# Patient Record
Sex: Female | Born: 1935
Health system: Southern US, Community
[De-identification: ages and names within clinical notes are randomized; demographics above are authoritative.]

## PROBLEM LIST (undated history)

## (undated) DIAGNOSIS — F32A Depression, unspecified: Secondary | ICD-10-CM

## (undated) DIAGNOSIS — N189 Chronic kidney disease, unspecified: Secondary | ICD-10-CM

## (undated) DIAGNOSIS — Z952 Presence of prosthetic heart valve: Secondary | ICD-10-CM

## (undated) DIAGNOSIS — E039 Hypothyroidism, unspecified: Secondary | ICD-10-CM

## (undated) DIAGNOSIS — F329 Major depressive disorder, single episode, unspecified: Secondary | ICD-10-CM

## (undated) DIAGNOSIS — I2510 Atherosclerotic heart disease of native coronary artery without angina pectoris: Secondary | ICD-10-CM

## (undated) DIAGNOSIS — E079 Disorder of thyroid, unspecified: Secondary | ICD-10-CM

## (undated) DIAGNOSIS — M109 Gout, unspecified: Secondary | ICD-10-CM

## (undated) DIAGNOSIS — I10 Essential (primary) hypertension: Secondary | ICD-10-CM

## (undated) DIAGNOSIS — I350 Nonrheumatic aortic (valve) stenosis: Secondary | ICD-10-CM

## (undated) DIAGNOSIS — Z950 Presence of cardiac pacemaker: Secondary | ICD-10-CM

## (undated) DIAGNOSIS — E119 Type 2 diabetes mellitus without complications: Secondary | ICD-10-CM

## (undated) HISTORY — PX: ROTATOR CUFF REPAIR: SHX139

## (undated) HISTORY — PX: APPENDECTOMY: SHX54

## (undated) HISTORY — PX: EYE SURGERY: SHX253

## (undated) HISTORY — PX: FEMUR FRACTURE SURGERY: SHX633

## (undated) HISTORY — PX: THYROID SURGERY: SHX805

---

## 2008-05-13 ENCOUNTER — Emergency Department (HOSPITAL_BASED_OUTPATIENT_CLINIC_OR_DEPARTMENT_OTHER): Admission: EM | Admit: 2008-05-13 | Discharge: 2008-05-13 | Payer: Self-pay | Admitting: Emergency Medicine

## 2011-03-31 LAB — COMPREHENSIVE METABOLIC PANEL
Alkaline Phosphatase: 50
Calcium: 8.8
Creatinine, Ser: 1
GFR calc Af Amer: >60
Potassium: 5.9 — ABNORMAL HIGH

## 2011-03-31 LAB — DIFFERENTIAL
Basophils Relative: 1
Lymphs Abs: 1.3
Monocytes Relative: 7
Neutrophils Relative %: 73

## 2011-03-31 LAB — PROTIME-INR: Prothrombin Time: 14.9

## 2011-03-31 LAB — URINE CULTURE

## 2011-03-31 LAB — GLUCOSE, CAPILLARY: Glucose-Capillary: 116 — ABNORMAL HIGH

## 2011-03-31 LAB — URINALYSIS, ROUTINE W REFLEX MICROSCOPIC
Bilirubin Urine: NEGATIVE
Hgb urine dipstick: NEGATIVE
Ketones, ur: NEGATIVE
Protein, ur: NEGATIVE
Specific Gravity, Urine: 1.007
pH: 5

## 2011-03-31 LAB — CBC
Hemoglobin: 10.9 — ABNORMAL LOW
RDW: 15
WBC: 7.8

## 2011-03-31 LAB — URINE MICROSCOPIC-ADD ON

## 2011-03-31 LAB — POCT CARDIAC MARKERS: Myoglobin, poc: 141

## 2014-10-02 ENCOUNTER — Emergency Department (HOSPITAL_BASED_OUTPATIENT_CLINIC_OR_DEPARTMENT_OTHER): Payer: Medicare Other

## 2014-10-02 ENCOUNTER — Emergency Department (HOSPITAL_BASED_OUTPATIENT_CLINIC_OR_DEPARTMENT_OTHER)
Admission: EM | Admit: 2014-10-02 | Discharge: 2014-10-02 | Disposition: A | Discharge status: Home / Self Care | Payer: Medicare Other | Attending: Emergency Medicine | Admitting: Emergency Medicine

## 2014-10-02 ENCOUNTER — Encounter (HOSPITAL_BASED_OUTPATIENT_CLINIC_OR_DEPARTMENT_OTHER): Payer: Self-pay

## 2014-10-02 DIAGNOSIS — Y92091 Bathroom in other non-institutional residence as the place of occurrence of the external cause: Secondary | ICD-10-CM | POA: Insufficient documentation

## 2014-10-02 DIAGNOSIS — I10 Essential (primary) hypertension: Secondary | ICD-10-CM | POA: Diagnosis not present

## 2014-10-02 DIAGNOSIS — Y9389 Activity, other specified: Secondary | ICD-10-CM | POA: Insufficient documentation

## 2014-10-02 DIAGNOSIS — Y998 Other external cause status: Secondary | ICD-10-CM | POA: Insufficient documentation

## 2014-10-02 DIAGNOSIS — S4991XA Unspecified injury of right shoulder and upper arm, initial encounter: Secondary | ICD-10-CM | POA: Diagnosis present

## 2014-10-02 DIAGNOSIS — Z8639 Personal history of other endocrine, nutritional and metabolic disease: Secondary | ICD-10-CM | POA: Diagnosis not present

## 2014-10-02 DIAGNOSIS — S42291A Other displaced fracture of upper end of right humerus, initial encounter for closed fracture: Secondary | ICD-10-CM | POA: Insufficient documentation

## 2014-10-02 DIAGNOSIS — W010XXA Fall on same level from slipping, tripping and stumbling without subsequent striking against object, initial encounter: Secondary | ICD-10-CM | POA: Diagnosis not present

## 2014-10-02 DIAGNOSIS — E119 Type 2 diabetes mellitus without complications: Secondary | ICD-10-CM | POA: Diagnosis not present

## 2014-10-02 HISTORY — DX: Disorder of thyroid, unspecified: E07.9

## 2014-10-02 HISTORY — DX: Essential (primary) hypertension: I10

## 2014-10-02 MED ORDER — ACETAMINOPHEN 325 MG PO TABS
650.0000 mg | ORAL_TABLET | Freq: Once | ORAL | Status: AC
  Administered 2014-10-02: 650 mg via ORAL
  Filled 2014-10-02: qty 2

## 2014-10-02 MED ORDER — TRAMADOL HCL 50 MG PO TABS: 50.0000 mg | ORAL_TABLET | Freq: Four times a day (QID) | ORAL | Status: DC | PRN

## 2014-10-02 NOTE — ED Provider Notes (Signed)
CSN: [id number]     Arrival date & time 10/02/14  1328 History   First MD Initiated Contact with Patient 10/02/14 1549     Chief Complaint  Patient presents with  . Fall  . Shoulder Injury     (Consider location/radiation/quality/duration/timing/severity/associated sxs/prior Treatment) HPI Molly Cobb is a 81 y.o. female who comes in for evaluation of right shoulder discomfort. Patient with recent right she rolled fracture comes in for evaluation after fall on her right shoulder this morning at 12:00. States he tripped over the bathroom around and landed on her right shoulder. She denies any headache, dizziness, head trauma, loss of consciousness, nausea or vomiting, numbness or weakness.she is not taking anything to improve her symptoms. Keeping her shoulder still makes the pain better, moving her shoulder exacerbates her pain. She rates the discomfort with movement at a 10/10. No other aggravating or modifying factors. No anticoagulation. No coagulopathies  Past Medical History  Diagnosis Date  . Diabetes mellitus without complication   . Hypertension   . Thyroid disease    Past Surgical History  Procedure Laterality Date  . Femur fracture surgery    . Rotator cuff repair     No family history on file. History  Substance Use Topics  . Smoking status: Never Smoker   . Smokeless tobacco: Not on file  . Alcohol Use: No   OB History    No data available     Review of Systems  Constitutional: Negative for fever.  Respiratory: Negative for shortness of breath.   Cardiovascular: Negative for chest pain.  Musculoskeletal: Positive for arthralgias.  Skin: Negative for rash.  Neurological: Negative for weakness and numbness.      Allergies  Sulfa antibiotics  Home Medications   Prior to Admission medications   Medication Sig Start Date End Date Taking? Authorizing Provider  traMADol (ULTRAM) 50 MG tablet Take 1 tablet (50 mg total) by mouth every 6 (six) hours as needed.  10/02/14   Abubakr Wieman, PA-C   BP 131/66 mmHg  Pulse 76  Temp(Src) 97.9 F (36.6 C) (Oral)  Resp 16  Ht 5\' 3"  (1.6 m)  Wt 167 lb (75.751 kg)  BMI 29.59 kg/m2  SpO2 99% Physical Exam  Constitutional: She is oriented to person, place, and time. She appears well-developed and well-nourished.  Awake, alert, nontoxic appearance.  HENT:  Head: Normocephalic and atraumatic.  Mouth/Throat: No oropharyngeal exudate.  Eyes: Conjunctivae are normal. Pupils are equal, round, and reactive to light. Right eye exhibits no discharge. Left eye exhibits no discharge. No scleral icterus.  Neck: Neck supple.  Cardiovascular: Normal rate, regular rhythm and normal heart sounds.   Pulmonary/Chest: Effort normal and breath sounds normal. No respiratory distress. She has no wheezes. She has no rales. She exhibits no tenderness.  Abdominal: Soft. There is no tenderness. There is no rebound.  Musculoskeletal: She exhibits no tenderness.  ExquisiteTenderness to anterior aspect of right shoulder. There is diffuse edema to right shoulder. No ecchymosis. Limited range of motion secondary to discomfort. Full range of motion of right elbow and wrist. Neurovascularly intact. Distal pulses intact with brisk cap refill. No cervical bony tenderness. No scapular tenderness.  Neurological: She is alert and oriented to person, place, and time.  Mental status and motor strength appears baseline for patient and situation.  Skin: Skin is warm and dry. No rash noted.  Psychiatric: She has a normal mood and affect.  Nursing note and vitals reviewed.   ED Course  Procedures (including  critical care time) Labs Review Labs Reviewed - No data to display  Imaging Review Dg Shoulder Right  10/02/2014   CLINICAL DATA:  Pain following fall  EXAM: RIGHT SHOULDER - 2+ VIEW  COMPARISON:  None.  FINDINGS: Frontal and Y scapular images obtained. There is an impacted fracture of the humeral head with mild varus angulation at the  fracture site. There is evidence of old trauma involving the proximal to mid humeral diaphysis. No dislocation. Moderate generalized osteoarthritic changes present.  IMPRESSION: Impacted fracture of the humeral head with mild varus angulation at the fracture site. Evidence of old trauma involving the proximal to mid humeral diaphysis. No dislocation. Moderate generalized osteoarthritic change.   Electronically Signed   By: Lowella Grip III M.D.   On: 10/02/2014 14:28   Dg Humerus Right  10/02/2014   CLINICAL DATA:  Status post fall today. History of right humerus fracture 3-4 months ago. Pain. Initial encounter.  EXAM: RIGHT HUMERUS - 2+ VIEW  COMPARISON:  None.  FINDINGS: No acute bony or joint abnormality is identified. Diaphyseal fracture right humerus with callus formation is noted. Imaged right lung and ribs appear normal.  IMPRESSION: No acute abnormality.  Healing right humerus fracture.   Electronically Signed   By: Inge Rise M.D.   On: 10/02/2014 14:28     EKG Interpretation None     Meds given in ED:  Medications  acetaminophen (TYLENOL) tablet 650 mg (650 mg Oral Given 10/02/14 1630)    Discharge Medication List as of 10/02/2014  4:52 PM    START taking these medications   Details  traMADol (ULTRAM) 50 MG tablet Take 1 tablet (50 mg total) by mouth every 6 (six) hours as needed., Starting 10/02/2014, Until Discontinued, Print       Filed Vitals:   10/02/14 1350 10/02/14 1632  BP: 132/58 131/66  Pulse: 86 76  Temp: 97.9 F (36.6 C)   TempSrc: Oral   Resp: 18 16  Height: 5\' 3"  (1.6 m)   Weight: 167 lb (75.751 kg)   SpO2: 99% 99%    MDM  Vitals stable - WNL -afebrile Pt resting comfortably in ED. PE--exquisite tenderness to right shoulder with significant edema. No ecchymosis, erythema or overt warmth. Range of motion in shoulder decreased secondary to discomfort. Full range of motion in right elbow and wrist.Patient is neurovascularly intact. Imaging-x-ray right  shoulder shows impacted fracture of humeral head with mild varus angulation at fracture site. X-rays of the wrist shows old, healing right humerus fracture  No evidence of hemarthrosis,other vascular or neurological compromise. Consult orthopedic surgery, spoke with Dr. Damita Dunnings PA, Randall Hiss who recommends shoulder splint and will see patient in the office tomorrow. Will DC with pain medicine, shoulder splint for patient comfort. I discussed all relevant lab findings and imaging results with pt and they verbalized understanding. Discussed f/u with PCP within 48 hrs and return precautions, pt very amenable to plan.  Final diagnoses:  Fracture of humeral head, closed, right, initial encounter        Comer Locket, PA-C 10/02/14 [place], MD 10/03/14 [phone]

## 2014-10-02 NOTE — ED Notes (Signed)
MD at bedside. 

## 2014-10-02 NOTE — ED Notes (Addendum)
Deformity/swelling noted to right shoulder-pt states she had recent fx to her right upper arm

## 2014-10-02 NOTE — ED Notes (Signed)
81 yo AA female presents to ED stating she fell onto carpet w/ incident occuring today, denies any alter LOC pre & post fall, denies hitting head, states fell onto Rt shoulder which appears slightly swollen.

## 2014-10-02 NOTE — Discharge Instructions (Signed)
Shoulder Fracture You have a fractured humerus (bone in the upper arm) at the shoulder just below the ball of the shoulder joint. Most of the time the bones of a broken shoulder are in an acceptable position. Usually the injury can be treated with a shoulder immobilizer or sling and swath bandage. These devices support the arm and prevent any shoulder movement. If the bones are not in a good position, then surgery is sometimes needed. Shoulder fractures usually cause swelling, pain, and discoloration around the upper arm initially. They heal in 8-12 weeks with proper treatment. Rest in bed or a reclining chair as long as your shoulder is very painful. Sitting up generally results in less pain at the fracture site. Do not remove your shoulder bandage until your caregiver approves. You may apply ice packs over the shoulder for 20-30 minutes every 2 hours for the next 2-3 days to reduce the pain and swelling. Use your pain medicine as prescribed.  SEEK IMMEDIATE MEDICAL CARE IF:  You develop severe shoulder pain unrelieved by rest and taking pain medicine.  You have pain, numbness, tingling, or weakness in the hand or wrist.  You develop shortness of breath, chest pain, severe weakness, or fainting.  You have severe pain with motion of the fingers or wrist. MAKE SURE YOU:   Understand these instructions.  Will watch your condition.  Will get help right away if you are not doing well or get worse. Document Released: 07/22/2004 Document Revised: 09/06/2011 Document Reviewed: 10/02/2008 [facility] Patient Information 2015 [place], [place]. This information is not intended to replace advice given to you by your health care provider. Make sure you discuss any questions you have with your health care provider.  Please take your medications as directed. Do not drive or upper machinery while taking this medication. UR at an increased risk for falling while taking this medication, please use extreme caution.  Please follow-up with orthopedic surgery tomorrow for further evaluation of your shoulder injury. Return to ED for new or worsening symptoms.

## 2014-10-02 NOTE — ED Notes (Signed)
Pt states she tripped over rug in bathroom fell just PTA-pain to right UE and shoulder

## 2014-10-02 NOTE — ED Notes (Signed)
Ice pack applied to rt shoulder

## 2015-08-02 DIAGNOSIS — I129 Hypertensive chronic kidney disease with stage 1 through stage 4 chronic kidney disease, or unspecified chronic kidney disease: Secondary | ICD-10-CM | POA: Diagnosis present

## 2015-08-02 DIAGNOSIS — N184 Chronic kidney disease, stage 4 (severe): Secondary | ICD-10-CM

## 2015-09-09 DIAGNOSIS — E1122 Type 2 diabetes mellitus with diabetic chronic kidney disease: Secondary | ICD-10-CM | POA: Diagnosis present

## 2015-09-09 DIAGNOSIS — N184 Chronic kidney disease, stage 4 (severe): Secondary | ICD-10-CM | POA: Diagnosis present

## 2015-11-30 ENCOUNTER — Emergency Department (HOSPITAL_BASED_OUTPATIENT_CLINIC_OR_DEPARTMENT_OTHER): Payer: Medicare Other

## 2015-11-30 ENCOUNTER — Encounter (HOSPITAL_BASED_OUTPATIENT_CLINIC_OR_DEPARTMENT_OTHER): Payer: Self-pay | Admitting: Emergency Medicine

## 2015-11-30 ENCOUNTER — Emergency Department (HOSPITAL_BASED_OUTPATIENT_CLINIC_OR_DEPARTMENT_OTHER)
Admission: EM | Admit: 2015-11-30 | Discharge: 2015-11-30 | Disposition: A | Discharge status: Home / Self Care | Payer: Medicare Other | Attending: Emergency Medicine | Admitting: Emergency Medicine

## 2015-11-30 DIAGNOSIS — E119 Type 2 diabetes mellitus without complications: Secondary | ICD-10-CM | POA: Diagnosis not present

## 2015-11-30 DIAGNOSIS — W19XXXA Unspecified fall, initial encounter: Secondary | ICD-10-CM | POA: Insufficient documentation

## 2015-11-30 DIAGNOSIS — I10 Essential (primary) hypertension: Secondary | ICD-10-CM | POA: Insufficient documentation

## 2015-11-30 DIAGNOSIS — Y939 Activity, unspecified: Secondary | ICD-10-CM | POA: Insufficient documentation

## 2015-11-30 DIAGNOSIS — Y999 Unspecified external cause status: Secondary | ICD-10-CM | POA: Insufficient documentation

## 2015-11-30 DIAGNOSIS — Y929 Unspecified place or not applicable: Secondary | ICD-10-CM | POA: Diagnosis not present

## 2015-11-30 DIAGNOSIS — S299XXA Unspecified injury of thorax, initial encounter: Secondary | ICD-10-CM | POA: Diagnosis present

## 2015-11-30 DIAGNOSIS — F329 Major depressive disorder, single episode, unspecified: Secondary | ICD-10-CM | POA: Diagnosis not present

## 2015-11-30 DIAGNOSIS — Z79899 Other long term (current) drug therapy: Secondary | ICD-10-CM | POA: Insufficient documentation

## 2015-11-30 DIAGNOSIS — S20211A Contusion of right front wall of thorax, initial encounter: Secondary | ICD-10-CM | POA: Insufficient documentation

## 2015-11-30 HISTORY — DX: Depression, unspecified: F32.A

## 2015-11-30 HISTORY — DX: Major depressive disorder, single episode, unspecified: F32.9

## 2015-11-30 HISTORY — DX: Gout, unspecified: M10.9

## 2015-11-30 MED ORDER — DICLOFENAC SODIUM 1 % TD GEL
4.0000 g | Freq: Four times a day (QID) | TRANSDERMAL | Status: DC
Start: 2015-11-30 — End: 2017-02-24

## 2015-11-30 MED ORDER — TRAMADOL HCL 50 MG PO TABS
50.0000 mg | ORAL_TABLET | Freq: Once | ORAL | Status: AC
  Administered 2015-11-30: 50 mg via ORAL
  Filled 2015-11-30: qty 1

## 2015-11-30 MED ORDER — ACETAMINOPHEN 500 MG PO TABS
1000.0000 mg | ORAL_TABLET | Freq: Once | ORAL | Status: AC
  Administered 2015-11-30: 1000 mg via ORAL
  Filled 2015-11-30: qty 2

## 2015-11-30 MED ORDER — LIDOCAINE 5 % EX PTCH: 1.0000 | MEDICATED_PATCH | CUTANEOUS | Status: DC

## 2015-11-30 MED ORDER — NAPROXEN 250 MG PO TABS
500.0000 mg | ORAL_TABLET | Freq: Once | ORAL | Status: AC
  Administered 2015-11-30: 500 mg via ORAL
  Filled 2015-11-30: qty 2

## 2015-11-30 NOTE — ED Notes (Signed)
IS done x 5. Pt did 1750 with good effort.

## 2015-11-30 NOTE — Discharge Instructions (Signed)
Chest Contusion °A contusion is a deep bruise. Bruises happen when an injury causes bleeding under the skin. Signs of bruising include pain, puffiness (swelling), and discolored skin. The bruise may turn blue, purple, or yellow.  °HOME CARE °· Put ice on the injured area. °¨ Put ice in a plastic bag. °¨ Place a towel between the skin and the bag. °¨ Leave the ice on for 15-20 minutes at a time, 03-04 times a day for the first 48 hours. °· Only take medicine as told by your doctor. °· Rest. °· Take deep breaths (deep-breathing exercises) as told by your doctor. °· Stop smoking if you smoke. °· Do not lift objects over 5 pounds (2.3 kilograms) for 3 days or longer if told by your doctor. °GET HELP RIGHT AWAY IF:  °· You have more bruising or puffiness. °· You have pain that gets worse. °· You have trouble breathing. °· You are dizzy, weak, or pass out (faint). °· You have blood in your pee (urine) or poop (stool). °· You cough up or throw up (vomit) blood. °· Your puffiness or pain is not helped with medicines. °MAKE SURE YOU:  °· Understand these instructions. °· Will watch your condition. °· Will get help right away if you are not doing well or get worse. °  °This information is not intended to replace advice given to you by your health care provider. Make sure you discuss any questions you have with your health care provider. °  °Document Released: 12/01/2007 Document Revised: 03/08/2012 Document Reviewed: 12/06/2011 °[employer] Interactive Patient Education ©2016 [employer] ° °

## 2015-11-30 NOTE — ED Notes (Signed)
Patient reports that she fell earlier today and now it hurts to take a deep breath.

## 2015-11-30 NOTE — ED Provider Notes (Signed)
CSN: [id number]     Arrival date & time 11/30/15  0014 History   First MD Initiated Contact with Patient 11/30/15 0143     Chief Complaint  Patient presents with  . Rib Injury     (Consider location/radiation/quality/duration/timing/severity/associated sxs/prior Treatment) Patient is a 82 y.o. female presenting with fall. The history is provided by the patient.  Fall This is a new problem. The current episode started 6 to 12 hours ago. The problem occurs constantly. The problem has not changed since onset.Pertinent negatives include no abdominal pain, no headaches and no shortness of breath. Nothing aggravates the symptoms. Nothing relieves the symptoms. She has tried nothing for the symptoms. The treatment provided no relief.  Hit right ribs.  Did not hit head.  Is under a lot of stress as husband of 30 years dies recently.  No SI no HI  Past Medical History  Diagnosis Date  . Diabetes mellitus without complication ([place])   . Hypertension   . Thyroid disease   . Depression   . Gout    Past Surgical History  Procedure Laterality Date  . Femur fracture surgery    . Rotator cuff repair     History reviewed. No pertinent family history. Social History  Substance Use Topics  . Smoking status: Never Smoker   . Smokeless tobacco: None  . Alcohol Use: No   OB History    No data available     Review of Systems  Respiratory: Negative for shortness of breath.   Gastrointestinal: Negative for abdominal pain.  Neurological: Negative for headaches.  All other systems reviewed and are negative.     Allergies  Sulfa antibiotics  Home Medications   Prior to Admission medications   Medication Sig Start Date End Date Taking? Authorizing Provider  clonazePAM (KLONOPIN) 0.5 MG tablet Take 0.5 mg by mouth 2 (two) times daily as needed for anxiety.   Yes Historical Provider, MD  metoprolol tartrate (LOPRESSOR) 25 MG tablet Take 5 mg by mouth 2 (two) times daily.   Yes Historical  Provider, MD  sodium bicarbonate 650 MG tablet Take 650 mg by mouth 4 (four) times daily.   Yes Historical Provider, MD  diclofenac sodium (VOLTAREN) 1 % GEL Apply 4 g topically 4 (four) times daily. 11/30/15   Celes Dedic, MD  lidocaine (LIDODERM) 5 % Place 1 patch onto the skin daily. Remove & Discard patch within 12 hours or as directed by MD 11/30/15   Epifanio Labrador, MD  traMADol (ULTRAM) 50 MG tablet Take 1 tablet (50 mg total) by mouth every 6 (six) hours as needed. 10/02/14   Comer Locket, PA-C   BP 155/72 mmHg  Pulse 78  Temp(Src) 98 F (36.7 C) (Oral)  Resp 18  Ht _0  (1.6 m)  Wt 168 lb (76.204 kg)  BMI 29.77 kg/m2  SpO2 99% Physical Exam  Constitutional: She appears well-developed and well-nourished. No distress.  HENT:  Head: Normocephalic and atraumatic. Head is without raccoon's eyes and without Battle's sign.  Right Ear: No mastoid tenderness. No hemotympanum.  Left Ear: No mastoid tenderness. No hemotympanum.  Mouth/Throat: Oropharynx is clear and moist.  Eyes: Conjunctivae are normal. Pupils are equal, round, and reactive to light.  Neck: Normal range of motion. Neck supple.  Cardiovascular: Normal rate, regular rhythm and intact distal pulses.   Pulmonary/Chest: Effort normal and breath sounds normal. No respiratory distress. She has no wheezes. She has no rales.  Abdominal: Soft. Bowel sounds are normal. There is  no tenderness. There is no rebound and no guarding.  Musculoskeletal: Normal range of motion.  Neurological: She is alert. She has normal reflexes.  Skin: Skin is warm and dry.  Psychiatric: She has a normal mood and affect.    ED Course  Procedures (including critical care time) Labs Review Labs Reviewed - No data to display  Imaging Review Dg Ribs Unilateral W/chest Right  11/30/2015  CLINICAL DATA:  Status post fall, with pleuritic right rib pain. Initial encounter. EXAM: RIGHT RIBS AND CHEST - 3+ VIEW COMPARISON:  CTA of the chest, and chest  radiograph, performed 05/13/2008 FINDINGS: No displaced rib fractures are seen. The lungs are well-aerated and clear. There is no evidence of focal opacification, pleural effusion or pneumothorax. The cardiomediastinal silhouette is borderline normal in size. A pacemaker is noted overlying the left chest wall, with leads ending overlying the right atrium and right ventricle. No acute osseous abnormalities are seen. There is chronic deformity involving the right humerus. IMPRESSION: 1. No displaced rib fracture seen. 2. No acute cardiopulmonary process identified. Electronically Signed   By: Garald Balding M.D.   On: 11/30/2015 01:54   I have personally reviewed and evaluated these images and lab results as part of my medical decision-making.   EKG Interpretation None      MDM   Final diagnoses:  Rib contusion, right, initial encounter    Filed Vitals:   11/30/15 0021 11/30/15 0259  BP: 160/55 155/72  Pulse: 76 78  Temp: 98 F (36.7 C)   Resp: 18 18    Results for orders placed or performed during the hospital encounter of 05/13/08  Urine culture  Result Value Ref Range   Specimen Description URINE, CLEAN CATCH    Special Requests NONE    Colony Count >=100,000 COLONIES/ML    Culture KLEBSIELLA PNEUMONIAE    Report Status 05/16/2008 FINAL    Organism ID, Bacteria KLEBSIELLA PNEUMONIAE       Susceptibility   Klebsiella pneumoniae - MIC    AMPICILLIN >=32 Resistant     CEFAZOLIN <=4 Sensitive     CEFTRIAXONE <=1 Sensitive     CIPROFLOXACIN <=0.25 Sensitive     GENTAMICIN <=1 Sensitive     LEVOFLOXACIN <=0.12 Sensitive     NITROFURANTOIN 64 Intermediate     TOBRAMYCIN <=1 Sensitive     TRIMETH/SULFA <=20 Sensitive   Glucose, capillary  Result Value Ref Range   Glucose-Capillary 116 (H)    Comment 1 Notify RN    Comment 2 Documented in Chart   CBC  Result Value Ref Range   WBC 7.8    RBC 4.21    Hemoglobin 10.9 (L)    HCT 33.5 (L)    MCV 79.6    MCHC 32.5    RDW  15.0    Platelets 221   Comprehensive metabolic panel  Result Value Ref Range   Sodium 140    Potassium 5.9 HEMOLYZED SPECIMEN, RESULTS MAY BE AFFECTED (H)    Chloride 107    CO2 27    Glucose, Bld 120 (H)    BUN 19    Creatinine, Ser 1.0    Calcium 8.8    Total Protein 7.5    Albumin 4.0    AST 32    ALT 8    Alkaline Phosphatase 50    Total Bilirubin 0.8    GFR calc non Af Amer 54 (L)    GFR calc Af Amer      >60  The eGFR has been calculated using the MDRD equation. This calculation has not been validated in all clinical  Differential  Result Value Ref Range   Neutrophils Relative % 73    Neutro Abs 5.6    Lymphocytes Relative 17    Lymphs Abs 1.3    Monocytes Relative 7    Monocytes Absolute 0.6    Eosinophils Relative 3    Eosinophils Absolute 0.2    Basophils Relative 1    Basophils Absolute 0.1   Protime-INR  Result Value Ref Range   Prothrombin Time 14.9    INR 1.1   APTT  Result Value Ref Range   aPTT (H)     55        IF BASELINE aPTT IS ELEVATED, SUGGEST PATIENT RISK ASSESSMENT BE USED TO DETERMINE APPROPRIATE ANTICOAGULANT THERAPY.  D-dimer, quantitative  Result Value Ref Range   D-Dimer, Quant (H)     0.96        AT THE INHOUSE ESTABLISHED CUTOFF VALUE OF 0.48 ug/mL FEU, THIS ASSAY HAS BEEN DOCUMENTED IN THE LITERATURE TO HAVE  Urinalysis, Routine w reflex microscopic  Result Value Ref Range   Color, Urine YELLOW    APPearance CLEAR    Specific Gravity, Urine 1.007    pH 5.0    Glucose, UA NEGATIVE    Hgb urine dipstick NEGATIVE    Bilirubin Urine NEGATIVE    Ketones, ur NEGATIVE    Protein, ur NEGATIVE    Urobilinogen, UA 0.2    Nitrite NEGATIVE    Leukocytes, UA SMALL (A)   Urine microscopic-add on  Result Value Ref Range   Squamous Epithelial / LPF RARE    WBC, UA 3-6    Bacteria, UA FEW (A)   Potassium  Result Value Ref Range   Potassium 4.5   POCT B-type natriuretic peptide (BNP)  Result Value Ref Range   B  Natriuretic Peptide, POC (H)     175 CRITICAL RESULT CALLED TO, READ BACK BY AND VERIFIED WITH: COWARDS RN [phone] _0  ERNSTT  Cardiac markers, POC  Result Value Ref Range   Myoglobin, poc 141    CKMB, poc 1.6    Troponin i, poc <0.05    Dg Ribs Unilateral W/chest Right  11/30/2015  CLINICAL DATA:  Status post fall, with pleuritic right rib pain. Initial encounter. EXAM: RIGHT RIBS AND CHEST - 3+ VIEW COMPARISON:  CTA of the chest, and chest radiograph, performed 05/13/2008 FINDINGS: No displaced rib fractures are seen. The lungs are well-aerated and clear. There is no evidence of focal opacification, pleural effusion or pneumothorax. The cardiomediastinal silhouette is borderline normal in size. A pacemaker is noted overlying the left chest wall, with leads ending overlying the right atrium and right ventricle. No acute osseous abnormalities are seen. There is chronic deformity involving the right humerus. IMPRESSION: 1. No displaced rib fracture seen. 2. No acute cardiopulmonary process identified. Electronically Signed   By: Garald Balding M.D.   On: 11/30/2015 01:54    Medications  acetaminophen (TYLENOL) tablet 1,000 mg (1,000 mg Oral Given 11/30/15 0217)  naproxen (NAPROSYN) tablet 500 mg (500 mg Oral Given 11/30/15 0217)  traMADol (ULTRAM) tablet 50 mg (50 mg Oral Given 11/30/15 0217)   Results for orders placed or performed during the hospital encounter of 05/13/08  Urine culture  Result Value Ref Range   Specimen Description URINE, CLEAN CATCH    Special Requests NONE    Colony Count >=100,000 COLONIES/ML    Culture  KLEBSIELLA PNEUMONIAE    Report Status 05/16/2008 FINAL    Organism ID, Bacteria KLEBSIELLA PNEUMONIAE       Susceptibility   Klebsiella pneumoniae - MIC    AMPICILLIN >=32 Resistant     CEFAZOLIN <=4 Sensitive     CEFTRIAXONE <=1 Sensitive     CIPROFLOXACIN <=0.25 Sensitive     GENTAMICIN <=1 Sensitive     LEVOFLOXACIN <=0.12 Sensitive     NITROFURANTOIN 64  Intermediate     TOBRAMYCIN <=1 Sensitive     TRIMETH/SULFA <=20 Sensitive   Glucose, capillary  Result Value Ref Range   Glucose-Capillary 116 (H)    Comment 1 Notify RN    Comment 2 Documented in Chart   CBC  Result Value Ref Range   WBC 7.8    RBC 4.21    Hemoglobin 10.9 (L)    HCT 33.5 (L)    MCV 79.6    MCHC 32.5    RDW 15.0    Platelets 221   Comprehensive metabolic panel  Result Value Ref Range   Sodium 140    Potassium 5.9 HEMOLYZED SPECIMEN, RESULTS MAY BE AFFECTED (H)    Chloride 107    CO2 27    Glucose, Bld 120 (H)    BUN 19    Creatinine, Ser 1.0    Calcium 8.8    Total Protein 7.5    Albumin 4.0    AST 32    ALT 8    Alkaline Phosphatase 50    Total Bilirubin 0.8    GFR calc non Af Amer 54 (L)    GFR calc Af Amer      >60        The eGFR has been calculated using the MDRD equation. This calculation has not been validated in all clinical  Differential  Result Value Ref Range   Neutrophils Relative % 73    Neutro Abs 5.6    Lymphocytes Relative 17    Lymphs Abs 1.3    Monocytes Relative 7    Monocytes Absolute 0.6    Eosinophils Relative 3    Eosinophils Absolute 0.2    Basophils Relative 1    Basophils Absolute 0.1   Protime-INR  Result Value Ref Range   Prothrombin Time 14.9    INR 1.1   APTT  Result Value Ref Range   aPTT (H)     55        IF BASELINE aPTT IS ELEVATED, SUGGEST PATIENT RISK ASSESSMENT BE USED TO DETERMINE APPROPRIATE ANTICOAGULANT THERAPY.  D-dimer, quantitative  Result Value Ref Range   D-Dimer, Quant (H)     0.96        AT THE INHOUSE ESTABLISHED CUTOFF VALUE OF 0.48 ug/mL FEU, THIS ASSAY HAS BEEN DOCUMENTED IN THE LITERATURE TO HAVE  Urinalysis, Routine w reflex microscopic  Result Value Ref Range   Color, Urine YELLOW    APPearance CLEAR    Specific Gravity, Urine 1.007    pH 5.0    Glucose, UA NEGATIVE    Hgb urine dipstick NEGATIVE    Bilirubin Urine NEGATIVE    Ketones, ur NEGATIVE    Protein, ur  NEGATIVE    Urobilinogen, UA 0.2    Nitrite NEGATIVE    Leukocytes, UA SMALL (A)   Urine microscopic-add on  Result Value Ref Range   Squamous Epithelial / LPF RARE    WBC, UA 3-6    Bacteria, UA FEW (A)   Potassium  Result Value  Ref Range   Potassium 4.5   POCT B-type natriuretic peptide (BNP)  Result Value Ref Range   B Natriuretic Peptide, POC (H)     175 CRITICAL RESULT CALLED TO, READ BACK BY AND VERIFIED WITH: COWARDS RN [phone] _0  ERNSTT  Cardiac markers, POC  Result Value Ref Range   Myoglobin, poc 141    CKMB, poc 1.6    Troponin i, poc <0.05    Dg Ribs Unilateral W/chest Right  11/30/2015  CLINICAL DATA:  Status post fall, with pleuritic right rib pain. Initial encounter. EXAM: RIGHT RIBS AND CHEST - 3+ VIEW COMPARISON:  CTA of the chest, and chest radiograph, performed 05/13/2008 FINDINGS: No displaced rib fractures are seen. The lungs are well-aerated and clear. There is no evidence of focal opacification, pleural effusion or pneumothorax. The cardiomediastinal silhouette is borderline normal in size. A pacemaker is noted overlying the left chest wall, with leads ending overlying the right atrium and right ventricle. No acute osseous abnormalities are seen. There is chronic deformity involving the right humerus. IMPRESSION: 1. No displaced rib fracture seen. 2. No acute cardiopulmonary process identified. Electronically Signed   By: Garald Balding M.D.   On: 11/30/2015 01:54    Medications  acetaminophen (TYLENOL) tablet 1,000 mg (1,000 mg Oral Given 11/30/15 0217)  naproxen (NAPROSYN) tablet 500 mg (500 mg Oral Given 11/30/15 0217)  traMADol (ULTRAM) tablet 50 mg (50 mg Oral Given 11/30/15 0217)    Stable for discharge.  Instructed on incentive spirometer.  Follow up with your PMD regarding your grief.  Strict return precautions given      Chalee Hirota, MD 11/30/15 [phone]

## 2016-06-29 DIAGNOSIS — E039 Hypothyroidism, unspecified: Secondary | ICD-10-CM | POA: Diagnosis present

## 2016-10-13 DIAGNOSIS — I495 Sick sinus syndrome: Secondary | ICD-10-CM | POA: Diagnosis present

## 2017-02-12 ENCOUNTER — Encounter (HOSPITAL_BASED_OUTPATIENT_CLINIC_OR_DEPARTMENT_OTHER): Payer: Self-pay | Admitting: Emergency Medicine

## 2017-02-12 ENCOUNTER — Inpatient Hospital Stay (HOSPITAL_BASED_OUTPATIENT_CLINIC_OR_DEPARTMENT_OTHER)
Admission: EM | Admit: 2017-02-12 | Discharge: 2017-02-18 | DRG: 286 | Disposition: A | Discharge status: Home Health | Payer: Medicare Other | Attending: Internal Medicine | Admitting: Internal Medicine

## 2017-02-12 ENCOUNTER — Emergency Department (HOSPITAL_BASED_OUTPATIENT_CLINIC_OR_DEPARTMENT_OTHER): Payer: Medicare Other

## 2017-02-12 DIAGNOSIS — Z8249 Family history of ischemic heart disease and other diseases of the circulatory system: Secondary | ICD-10-CM

## 2017-02-12 DIAGNOSIS — R195 Other fecal abnormalities: Secondary | ICD-10-CM

## 2017-02-12 DIAGNOSIS — K2990 Gastroduodenitis, unspecified, without bleeding: Secondary | ICD-10-CM | POA: Diagnosis present

## 2017-02-12 DIAGNOSIS — D649 Anemia, unspecified: Secondary | ICD-10-CM | POA: Diagnosis present

## 2017-02-12 DIAGNOSIS — R072 Precordial pain: Secondary | ICD-10-CM

## 2017-02-12 DIAGNOSIS — I2584 Coronary atherosclerosis due to calcified coronary lesion: Secondary | ICD-10-CM | POA: Diagnosis present

## 2017-02-12 DIAGNOSIS — D631 Anemia in chronic kidney disease: Secondary | ICD-10-CM | POA: Diagnosis present

## 2017-02-12 DIAGNOSIS — Z950 Presence of cardiac pacemaker: Secondary | ICD-10-CM

## 2017-02-12 DIAGNOSIS — F419 Anxiety disorder, unspecified: Secondary | ICD-10-CM | POA: Diagnosis present

## 2017-02-12 DIAGNOSIS — E890 Postprocedural hypothyroidism: Secondary | ICD-10-CM | POA: Diagnosis present

## 2017-02-12 DIAGNOSIS — E785 Hyperlipidemia, unspecified: Secondary | ICD-10-CM | POA: Diagnosis present

## 2017-02-12 DIAGNOSIS — E1121 Type 2 diabetes mellitus with diabetic nephropathy: Secondary | ICD-10-CM | POA: Diagnosis present

## 2017-02-12 DIAGNOSIS — I129 Hypertensive chronic kidney disease with stage 1 through stage 4 chronic kidney disease, or unspecified chronic kidney disease: Secondary | ICD-10-CM | POA: Diagnosis present

## 2017-02-12 DIAGNOSIS — N184 Chronic kidney disease, stage 4 (severe): Secondary | ICD-10-CM | POA: Diagnosis present

## 2017-02-12 DIAGNOSIS — E1122 Type 2 diabetes mellitus with diabetic chronic kidney disease: Secondary | ICD-10-CM | POA: Diagnosis present

## 2017-02-12 DIAGNOSIS — I495 Sick sinus syndrome: Secondary | ICD-10-CM | POA: Diagnosis present

## 2017-02-12 DIAGNOSIS — I248 Other forms of acute ischemic heart disease: Secondary | ICD-10-CM | POA: Diagnosis present

## 2017-02-12 DIAGNOSIS — E11319 Type 2 diabetes mellitus with unspecified diabetic retinopathy without macular edema: Secondary | ICD-10-CM | POA: Diagnosis present

## 2017-02-12 DIAGNOSIS — E039 Hypothyroidism, unspecified: Secondary | ICD-10-CM | POA: Diagnosis present

## 2017-02-12 DIAGNOSIS — I130 Hypertensive heart and chronic kidney disease with heart failure and stage 1 through stage 4 chronic kidney disease, or unspecified chronic kidney disease: Secondary | ICD-10-CM | POA: Diagnosis present

## 2017-02-12 DIAGNOSIS — E872 Acidosis: Secondary | ICD-10-CM | POA: Diagnosis present

## 2017-02-12 DIAGNOSIS — I352 Nonrheumatic aortic (valve) stenosis with insufficiency: Secondary | ICD-10-CM | POA: Diagnosis present

## 2017-02-12 DIAGNOSIS — Z6834 Body mass index (BMI) 34.0-34.9, adult: Secondary | ICD-10-CM

## 2017-02-12 DIAGNOSIS — Z833 Family history of diabetes mellitus: Secondary | ICD-10-CM

## 2017-02-12 DIAGNOSIS — I5032 Chronic diastolic (congestive) heart failure: Secondary | ICD-10-CM | POA: Diagnosis present

## 2017-02-12 DIAGNOSIS — D500 Iron deficiency anemia secondary to blood loss (chronic): Secondary | ICD-10-CM | POA: Diagnosis present

## 2017-02-12 DIAGNOSIS — E669 Obesity, unspecified: Secondary | ICD-10-CM | POA: Diagnosis present

## 2017-02-12 DIAGNOSIS — Z961 Presence of intraocular lens: Secondary | ICD-10-CM | POA: Diagnosis present

## 2017-02-12 DIAGNOSIS — R079 Chest pain, unspecified: Secondary | ICD-10-CM | POA: Diagnosis present

## 2017-02-12 DIAGNOSIS — I350 Nonrheumatic aortic (valve) stenosis: Secondary | ICD-10-CM

## 2017-02-12 DIAGNOSIS — Z882 Allergy status to sulfonamides status: Secondary | ICD-10-CM

## 2017-02-12 DIAGNOSIS — I25118 Atherosclerotic heart disease of native coronary artery with other forms of angina pectoris: Secondary | ICD-10-CM | POA: Diagnosis not present

## 2017-02-12 DIAGNOSIS — K2970 Gastritis, unspecified, without bleeding: Secondary | ICD-10-CM

## 2017-02-12 DIAGNOSIS — K31811 Angiodysplasia of stomach and duodenum with bleeding: Secondary | ICD-10-CM | POA: Diagnosis present

## 2017-02-12 DIAGNOSIS — N189 Chronic kidney disease, unspecified: Secondary | ICD-10-CM | POA: Diagnosis present

## 2017-02-12 DIAGNOSIS — E1140 Type 2 diabetes mellitus with diabetic neuropathy, unspecified: Secondary | ICD-10-CM | POA: Diagnosis present

## 2017-02-12 HISTORY — DX: Nonrheumatic aortic (valve) stenosis: I35.0

## 2017-02-12 HISTORY — DX: Chronic kidney disease, unspecified: N18.9

## 2017-02-12 HISTORY — DX: Presence of cardiac pacemaker: Z95.0

## 2017-02-12 HISTORY — DX: Type 2 diabetes mellitus without complications: E11.9

## 2017-02-12 LAB — CBC WITH DIFFERENTIAL/PLATELET
Basophils Absolute: 0 10*3/uL (ref 0.0–0.1)
Basophils Relative: 0 %
EOS ABS: 0.2 10*3/uL (ref 0.0–0.7)
EOS PCT: 2 %
HCT: 24.2 % — ABNORMAL LOW (ref 36.0–46.0)
HEMOGLOBIN: 7.7 g/dL — AB (ref 12.0–15.0)
LYMPHS ABS: 1.3 10*3/uL (ref 0.7–4.0)
Lymphocytes Relative: 15 %
MCH: 26.3 pg (ref 26.0–34.0)
MCHC: 31.8 g/dL (ref 30.0–36.0)
MCV: 82.6 fL (ref 78.0–100.0)
MONO ABS: 1 10*3/uL (ref 0.1–1.0)
MONOS PCT: 11 %
Neutro Abs: 6.6 10*3/uL (ref 1.7–7.7)
Neutrophils Relative %: 72 %
PLATELETS: 212 10*3/uL (ref 150–400)
RBC: 2.93 MIL/uL — ABNORMAL LOW (ref 3.87–5.11)
RDW: 14 % (ref 11.5–15.5)
WBC: 9.1 10*3/uL (ref 4.0–10.5)

## 2017-02-12 LAB — BASIC METABOLIC PANEL
ANION GAP: 12 (ref 5–15)
BUN: 60 mg/dL — ABNORMAL HIGH (ref 6–20)
CALCIUM: 8 mg/dL — AB (ref 8.9–10.3)
CO2: 18 mmol/L — ABNORMAL LOW (ref 22–32)
Chloride: 108 mmol/L (ref 101–111)
Creatinine, Ser: 2.64 mg/dL — ABNORMAL HIGH (ref 0.44–1.00)
GFR, EST AFRICAN AMERICAN: 18 mL/min — AB (ref >60)
GFR, EST NON AFRICAN AMERICAN: 16 mL/min — AB (ref >60)
Glucose, Bld: 278 mg/dL — ABNORMAL HIGH (ref 65–99)
POTASSIUM: 4.4 mmol/L (ref 3.5–5.1)
SODIUM: 138 mmol/L (ref 135–145)

## 2017-02-12 LAB — TROPONIN I

## 2017-02-12 LAB — D-DIMER, QUANTITATIVE: D-Dimer, Quant: 0.89 ug/mL-FEU — ABNORMAL HIGH (ref 0.00–0.50)

## 2017-02-12 MED ORDER — ASPIRIN 81 MG PO CHEW
243.0000 mg | CHEWABLE_TABLET | Freq: Once | ORAL | Status: AC
  Administered 2017-02-12: 243 mg via ORAL
  Filled 2017-02-12: qty 3

## 2017-02-12 NOTE — ED Provider Notes (Signed)
Emergency Department Provider Note   I have reviewed the triage vital signs and the nursing notes.   HISTORY  Chief Complaint Chest Pain   HPI Molly Cobb is a 83 y.o. female presents to the emergency department for evaluation of exertional chest pressure that is worsening over the past week. She reports occasional rest symptoms. She was climbing stairs today when she had severe pressure with lightheadedness. No active chest pain at this time. Patient reports similar occasional CP in the past but getting much more severe this week and today. Denies any fever, chills, or productive cough. Typically followed by [facility]iology at [facility]. Denies any known recent heart cath, stenting, or stress testing. No radiation of symptoms.   Past Medical History:  Diagnosis Date  . Depression   . Diabetes mellitus without complication ([place])   . Gout   . Hypertension   . Thyroid disease     Patient Active Problem List   Diagnosis Date Noted  . Chest pain 02/12/2017    Past Surgical History:  Procedure Laterality Date  . FEMUR FRACTURE SURGERY    . ROTATOR CUFF REPAIR      Current Outpatient Rx  . Order #: [id number] Class: Historical Med  . Order #: [id number] Class: Historical Med  . Order #: [id number] Class: Historical Med  . Order #: [id number] Class: Print  . Order #: [id number] Class: Print  . Order #: [id number] Class: Historical Med  . Order #: [id number] Class: Historical Med  . Order #: [id number] Class: Print    Allergies Sulfa antibiotics  History reviewed. No pertinent family history.  Social History Social History  Substance Use Topics  . Smoking status: Never Smoker  . Smokeless tobacco: Never Used  . Alcohol use No    Review of Systems  Constitutional: No fever/chills Eyes: No visual changes. ENT: No sore throat. Cardiovascular: Positive chest pain. Respiratory: Positive shortness of breath. Gastrointestinal: No abdominal pain.  No nausea, no vomiting.  No diarrhea.  No  constipation. Genitourinary: Negative for dysuria. Musculoskeletal: Negative for back pain. Skin: Negative for rash. Neurological: Negative for headaches, focal weakness or numbness.  10-point ROS otherwise negative.  ____________________________________________   PHYSICAL EXAM:  VITAL SIGNS: ED Triage Vitals  Enc Vitals Group     BP 02/12/17 1903 (!) 109/56     Pulse Rate 02/12/17 1903 91     Resp 02/12/17 1903 18     Temp 02/12/17 1903 98.9 F (37.2 C)     Temp Source 02/12/17 1903 Oral     SpO2 02/12/17 1903 100 %     Weight 02/12/17 1859 178 lb (80.7 kg)     Height 02/12/17 1859 5\' 3"  (1.6 m)     Pain Score 02/12/17 1922 6   Constitutional: Alert and oriented. Well appearing and in no acute distress. Eyes: Conjunctivae are normal.  Head: Atraumatic. Nose: No congestion/rhinnorhea. Mouth/Throat: Mucous membranes are moist.   Neck: No stridor.   Cardiovascular: Normal rate, regular rhythm. Good peripheral circulation. Grossly normal heart sounds.   Respiratory: Normal respiratory effort.  No retractions. Lungs CTAB. Gastrointestinal: Soft and nontender. No distention.  Musculoskeletal: No lower extremity tenderness nor edema. No gross deformities of extremities. Neurologic:  Normal speech and language. No gross focal neurologic deficits are appreciated.  Skin:  Skin is warm, dry and intact. No rash noted.   ____________________________________________   LABS (all labs ordered are listed, but only abnormal results are displayed)  Labs Reviewed  BASIC METABOLIC PANEL - Abnormal; Notable for the following:  Result Value   CO2 18 (*)    Glucose, Bld 278 (*)    BUN 60 (*)    Creatinine, Ser 2.64 (*)    Calcium 8.0 (*)    GFR calc non Af Amer 16 (*)    GFR calc Af Amer 18 (*)    All other components within normal limits  CBC WITH DIFFERENTIAL/PLATELET - Abnormal; Notable for the following:    RBC 2.93 (*)    Hemoglobin 7.7 (*)    HCT 24.2 (*)    All other  components within normal limits  D-DIMER, QUANTITATIVE (NOT AT [facility]) - Abnormal; Notable for the following:    D-Dimer, Quant 0.89 (*)    All other components within normal limits  TROPONIN I   ____________________________________________  EKG   EKG Interpretation  Date/Time:  Saturday February 12 2017 19:01:40 EDT Ventricular Rate:  80 PR Interval:  256 QRS Duration: 102 QT Interval:  398 QTC Calculation: 459 R Axis:   38 Text Interpretation:  Atrial-paced rhythm with prolonged AV conduction Cannot rule out Inferior infarct , age undetermined ST & T wave abnormality, consider lateral ischemia Abnormal ECG No STEMI.  Confirmed by Nanda Quinton [phone]) on 02/12/2017 7:21:05 PM       ____________________________________________  RADIOLOGY  Dg Chest 2 View  Result Date: 02/12/2017 CLINICAL DATA:  Shortness of breath EXAM: CHEST  2 VIEW COMPARISON:  11/30/2015 FINDINGS: Left-sided multi lead pacing device as before. Streaky bibasilar atelectasis. No acute consolidation or pleural effusion. Borderline cardiomegaly. Aortic atherosclerosis. No pneumothorax. Degenerative changes of the spine. IMPRESSION: Minimal basilar atelectasis and borderline cardiomegaly. Negative for acute infiltrate or edema. Electronically Signed   By: Donavan Foil M.D.   On: 02/12/2017 19:48    ____________________________________________   PROCEDURES  Procedure(s) performed:   Procedures  None ____________________________________________   INITIAL IMPRESSION / ASSESSMENT AND PLAN / ED COURSE  Pertinent labs & imaging results that were available during my care of the patient were reviewed by me and considered in my medical decision making (see chart for details).  Patient resents to the emergency department for evaluation of chest pressure and lightheadedness. Symptoms both with exertion and at rest. No active chest pain. Plan for labs, chest pain, reevaluation. Patient with multiple ACS risk factors.  Follows with Cardiology and [facility].   09:14 PM Spoke with Hospitalist with [facility]. They are checking on beds and will call back. No active chest pain.   09:21 PM [facility] with no beds. Will call Hospitalist. Patient with anemia noted on CBC. No report or black or bloody BMs. No PRBC here for transfusion except by emergency release. Will defer transfusion to primary team.   Discussed patient's case with Hospitalist, Dr. Loleta Books. Patient and family (if present) updated with plan. Care transferred to Hospitalist service.  I reviewed all nursing notes, vitals, pertinent old records, EKGs, labs, imaging (as available).  D-dimer ordered by hospitalist request. Borderline elevated with age adjustment. CKD prevent CT angio. Defer decision on V/Q scan to admit team. Not available at this facility. Patient HDS with no acute distress. No change in bed status. Remains CP-free.   ____________________________________________  FINAL CLINICAL IMPRESSION(S) / ED DIAGNOSES  Final diagnoses:  Precordial chest pain  Anemia, unspecified type     MEDICATIONS GIVEN DURING THIS VISIT:  Medications  amLODipine (NORVASC) tablet 10 mg (10 mg Oral Given 02/13/17 1007)  levothyroxine (SYNTHROID, LEVOTHROID) tablet 100 mcg (100 mcg Oral Given 02/13/17 0634)  pravastatin (PRAVACHOL)  tablet 80 mg (not administered)  sodium bicarbonate tablet 650 mg (650 mg Oral Given 02/13/17 1007)  enoxaparin (LOVENOX) injection 30 mg (30 mg Subcutaneous Given 02/13/17 1007)  acetaminophen (TYLENOL) tablet 650 mg (not administered)    Or  acetaminophen (TYLENOL) suppository 650 mg (not administered)  aspirin chewable tablet 243 mg (243 mg Oral Given 02/12/17 1948)  technetium TC 92M diethylenetriame-pentaacetic acid (DTPA) injection 28.8 millicurie (33.7 millicuries Inhalation Given 02/13/17 0900)  technetium albumin aggregated (MAA) injection solution 4.45 millicurie (1.46 millicuries Intravenous Contrast Given  02/13/17 0933)     NEW OUTPATIENT MEDICATIONS STARTED DURING THIS VISIT:  None   Note:  This document was prepared using Dragon voice recognition software and may include unintentional dictation errors.  Nanda Quinton, MD Emergency Medicine    Camala Talwar, [facility], MD 02/13/17 1050

## 2017-02-12 NOTE — Progress Notes (Signed)
83 yo F with HTN, CKD IV, hyperlipidemia, DM diet controlled and pacer presents with exertional chest pain and dizziness for 1 week.  BP (!) 119/53   Pulse 75   Temp 98.9 F (37.2 C) (Oral)   Resp 18   Ht 5\' 3"  (1.6 m)   Wt 80.7 kg (178 lb)   SpO2 99%   BMI 31.53 kg/m   -Na 138, K 4.4, Cr 2.64 (baseline 2.2), WBC 9.1 Hgb 7.7 (baseline 9-12) -Got aspirin -CXR clear -ECG shows nonspecific changes -There chest pain free -Her Cardiologist is at [facility], but [facility] is full -To tele, OBS status -They will obtain D-dimer prior to transfer

## 2017-02-12 NOTE — ED Provider Notes (Signed)
Emergency Department Provider Note  By signing my name below, I, Ephriam Jenkins, attest that this documentation has been prepared under the direction and in the presence of Maie Kesinger, Wonda Olds, MD. Electronically signed, Ephriam Jenkins, ED Scribe. 02/12/17. 7:43 PM.   I have reviewed the triage vital signs and the nursing notes.   HISTORY  Chief Complaint Chest Pain   HPI HPI Comments: Molly Cobb is a 83 y.o. female ... Scribe note started in error. See other EDP note.   Past Medical History:  Diagnosis Date  . Depression   . Diabetes mellitus without complication ([place])   . Gout   . Hypertension   . Thyroid disease     There are no active problems to display for this patient.   Past Surgical History:  Procedure Laterality Date  . FEMUR FRACTURE SURGERY    . ROTATOR CUFF REPAIR      Current Outpatient Rx  . Order #: [id number] Class: Historical Med  . Order #: [id number] Class: Historical Med  . Order #: [id number] Class: Historical Med  . Order #: [id number] Class: Print  . Order #: [id number] Class: Print  . Order #: [id number] Class: Historical Med  . Order #: [id number] Class: Historical Med  . Order #: [id number] Class: Print    Allergies Sulfa antibiotics  History reviewed. No pertinent family history.  Social History Social History  Substance Use Topics  . Smoking status: Never Smoker  . Smokeless tobacco: Never Used  . Alcohol use No    Review of Systems  10-point ROS otherwise negative.  ____________________________________________   PHYSICAL EXAM:  VITAL SIGNS: ED Triage Vitals  Enc Vitals Group     BP 02/12/17 1903 (!) 109/56     Pulse Rate 02/12/17 1903 91     Resp 02/12/17 1903 18     Temp 02/12/17 1903 98.9 F (37.2 C)     Temp Source 02/12/17 1903 Oral     SpO2 02/12/17 1903 100 %     Weight 02/12/17 1859 178 lb (80.7 kg)     Height 02/12/17 1859 5\' 3"  (1.6 m)     Head Circumference --      Peak Flow --      Pain Score 02/12/17 1922 6     Pain  Loc --      Pain Edu? --      Excl. in [place]? --     ____________________________________________   LABS (all labs ordered are listed, but only abnormal results are displayed)  Labs Reviewed - No data to display ____________________________________________  ____________________________________________  RADIOLOGY  No results found.  ____________________________________________   PROCEDURES  Procedure(s) performed:   Procedures   ____________________________________________   INITIAL IMPRESSION / ASSESSMENT AND PLAN / ED COURSE  Pertinent labs & imaging results that were available during my care of the patient were reviewed by me and considered in my medical decision making (see chart for details).     ____________________________________________  FINAL CLINICAL IMPRESSION(S) / ED DIAGNOSES  Final diagnoses:  None     MEDICATIONS GIVEN DURING THIS VISIT:  Medications - No data to display   NEW OUTPATIENT MEDICATIONS STARTED DURING THIS VISIT:  New Prescriptions   No medications on file   I personally performed the services described in this documentation, which was scribed in my presence. The recorded information has been reviewed and is accurate.     Note:  This document was prepared using Dragon voice recognition software and may include unintentional dictation errors.  Nanda Quinton, MD Emergency  Medicine    Taite Baldassari, Wonda Olds, MD 02/13/17 1052

## 2017-02-12 NOTE — ED Triage Notes (Signed)
Patient reports that she has had increased chest pain with radiation to her back and right shoulder x 1 week. Patient reports SOB with excerption

## 2017-02-13 ENCOUNTER — Encounter (HOSPITAL_COMMUNITY): Payer: Self-pay | Admitting: Family Medicine

## 2017-02-13 ENCOUNTER — Observation Stay (HOSPITAL_COMMUNITY): Payer: Medicare Other

## 2017-02-13 DIAGNOSIS — N189 Chronic kidney disease, unspecified: Secondary | ICD-10-CM | POA: Diagnosis present

## 2017-02-13 DIAGNOSIS — N184 Chronic kidney disease, stage 4 (severe): Secondary | ICD-10-CM

## 2017-02-13 DIAGNOSIS — I129 Hypertensive chronic kidney disease with stage 1 through stage 4 chronic kidney disease, or unspecified chronic kidney disease: Secondary | ICD-10-CM

## 2017-02-13 DIAGNOSIS — R079 Chest pain, unspecified: Secondary | ICD-10-CM

## 2017-02-13 DIAGNOSIS — D631 Anemia in chronic kidney disease: Secondary | ICD-10-CM | POA: Diagnosis present

## 2017-02-13 DIAGNOSIS — E039 Hypothyroidism, unspecified: Secondary | ICD-10-CM | POA: Diagnosis not present

## 2017-02-13 DIAGNOSIS — E1122 Type 2 diabetes mellitus with diabetic chronic kidney disease: Secondary | ICD-10-CM | POA: Diagnosis not present

## 2017-02-13 DIAGNOSIS — I495 Sick sinus syndrome: Secondary | ICD-10-CM

## 2017-02-13 DIAGNOSIS — D649 Anemia, unspecified: Secondary | ICD-10-CM | POA: Diagnosis not present

## 2017-02-13 LAB — CBC
HCT: 25.3 % — ABNORMAL LOW (ref 36.0–46.0)
HEMOGLOBIN: 7.9 g/dL — AB (ref 12.0–15.0)
MCH: 25.3 pg — AB (ref 26.0–34.0)
MCHC: 31.2 g/dL (ref 30.0–36.0)
MCV: 81.1 fL (ref 78.0–100.0)
Platelets: 223 10*3/uL (ref 150–400)
RBC: 3.12 MIL/uL — ABNORMAL LOW (ref 3.87–5.11)
RDW: 14.4 % (ref 11.5–15.5)
WBC: 8.1 10*3/uL (ref 4.0–10.5)

## 2017-02-13 LAB — IRON AND TIBC
IRON: 40 ug/dL (ref 28–170)
Saturation Ratios: 10 % — ABNORMAL LOW (ref 10.4–31.8)
TIBC: 382 ug/dL (ref 250–450)
UIBC: 342 ug/dL

## 2017-02-13 LAB — TROPONIN I: Troponin I: <0.03 ng/mL (ref <0.03)

## 2017-02-13 LAB — FERRITIN: FERRITIN: 12 ng/mL (ref 11–307)

## 2017-02-13 LAB — GLUCOSE, CAPILLARY
GLUCOSE-CAPILLARY: 133 mg/dL — AB (ref 65–99)
GLUCOSE-CAPILLARY: 124 mg/dL — AB (ref 65–99)
GLUCOSE-CAPILLARY: 332 mg/dL — AB (ref 65–99)
Glucose-Capillary: 287 mg/dL — ABNORMAL HIGH (ref 65–99)
Glucose-Capillary: 157 mg/dL — ABNORMAL HIGH (ref 65–99)

## 2017-02-13 LAB — RETICULOCYTES
RBC.: 3.12 MIL/uL — AB (ref 3.87–5.11)
RETIC COUNT ABSOLUTE: 81.1 10*3/uL (ref 19.0–186.0)
Retic Ct Pct: 2.6 % (ref 0.4–3.1)

## 2017-02-13 LAB — VITAMIN B12: VITAMIN B 12: 2095 pg/mL — AB (ref 180–914)

## 2017-02-13 MED ORDER — INSULIN ASPART 100 UNIT/ML ~~LOC~~ SOLN
3.0000 [IU] | Freq: Three times a day (TID) | SUBCUTANEOUS | Status: DC
  Administered 2017-02-14 – 2017-02-18 (×8): 3 [IU] via SUBCUTANEOUS

## 2017-02-13 MED ORDER — LEVOTHYROXINE SODIUM 100 MCG PO TABS
100.0000 ug | ORAL_TABLET | Freq: Every day | ORAL | Status: DC
  Administered 2017-02-13 – 2017-02-18 (×5): 100 ug via ORAL
  Filled 2017-02-13 (×7): qty 1

## 2017-02-13 MED ORDER — TECHNETIUM TO 99M ALBUMIN AGGREGATED
4.2400 | Freq: Once | INTRAVENOUS | Status: AC | PRN
  Administered 2017-02-13: 4.24 via INTRAVENOUS

## 2017-02-13 MED ORDER — TECHNETIUM TC 99M DIETHYLENETRIAME-PENTAACETIC ACID
32.2000 | Freq: Once | INTRAVENOUS | Status: AC | PRN
  Administered 2017-02-13: 32.2 via RESPIRATORY_TRACT

## 2017-02-13 MED ORDER — ACETAMINOPHEN 650 MG RE SUPP: 650.0000 mg | Freq: Four times a day (QID) | RECTAL | Status: DC | PRN

## 2017-02-13 MED ORDER — INSULIN ASPART 100 UNIT/ML ~~LOC~~ SOLN
10.0000 [IU] | Freq: Once | SUBCUTANEOUS | Status: AC
  Administered 2017-02-13: 10 [IU] via SUBCUTANEOUS

## 2017-02-13 MED ORDER — INSULIN ASPART 100 UNIT/ML ~~LOC~~ SOLN
0.0000 [IU] | Freq: Three times a day (TID) | SUBCUTANEOUS | Status: DC
  Administered 2017-02-14: 2 [IU] via SUBCUTANEOUS
  Administered 2017-02-14: 3 [IU] via SUBCUTANEOUS
  Administered 2017-02-15: 2 [IU] via SUBCUTANEOUS
  Administered 2017-02-15: 1 [IU] via SUBCUTANEOUS
  Administered 2017-02-17: 3 [IU] via SUBCUTANEOUS
  Administered 2017-02-17: 1 [IU] via SUBCUTANEOUS
  Administered 2017-02-18: 2 [IU] via SUBCUTANEOUS

## 2017-02-13 MED ORDER — ENOXAPARIN SODIUM 30 MG/0.3ML ~~LOC~~ SOLN
30.0000 mg | SUBCUTANEOUS | Status: DC
  Administered 2017-02-13 – 2017-02-18 (×5): 30 mg via SUBCUTANEOUS
  Filled 2017-02-13 (×5): qty 0.3

## 2017-02-13 MED ORDER — SODIUM BICARBONATE 650 MG PO TABS
650.0000 mg | ORAL_TABLET | Freq: Four times a day (QID) | ORAL | Status: DC
  Administered 2017-02-13 – 2017-02-18 (×19): 650 mg via ORAL
  Filled 2017-02-13 (×19): qty 1

## 2017-02-13 MED ORDER — AMLODIPINE BESYLATE 10 MG PO TABS
10.0000 mg | ORAL_TABLET | Freq: Every day | ORAL | Status: DC
  Administered 2017-02-13 – 2017-02-18 (×6): 10 mg via ORAL
  Filled 2017-02-13 (×6): qty 1

## 2017-02-13 MED ORDER — PRAVASTATIN SODIUM 40 MG PO TABS
80.0000 mg | ORAL_TABLET | Freq: Every day | ORAL | Status: DC
  Administered 2017-02-13 – 2017-02-17 (×5): 80 mg via ORAL
  Filled 2017-02-13 (×5): qty 2

## 2017-02-13 MED ORDER — ACETAMINOPHEN 325 MG PO TABS
650.0000 mg | ORAL_TABLET | Freq: Four times a day (QID) | ORAL | Status: DC | PRN
  Administered 2017-02-13: 650 mg via ORAL
  Filled 2017-02-13 (×2): qty 2

## 2017-02-13 NOTE — Progress Notes (Signed)
  Seen examiend and agree c Dr. Loleta Books note  No cp now eating smiling High functioning at home-driving cooking etc Husband killed by tractor last year  She was walking up carrying some cases of water [!] up many flights of stairs. She got SOB She dont smoke, drink, no estrogen no long care rides  O/e comfy, no cp Smiling and joking cta b, no added sound s1 s2 no m/r/g   p V/q scan, then lexiscan--if neg? Home  Molly Griffes, MD [facility] [phone]

## 2017-02-13 NOTE — H&P (Signed)
History and Physical  Patient Name: Molly Cobb     GUR:[id number]    DOB: Apr 29, 1932    DOA: 02/12/2017 PCP: Guadlupe Spanish, MD  Patient coming from: Home --> [facility]  Chief Complaint: Chest pain      HPI: Molly Cobb is a 83 y.o. female with a past medical history significant for HTN, CKD IV baseline Cr 2.3, diet-controlled DM, SSS with pacer and hypothyroidism who presents with chest pain tonight.  The patient has been in her usual state of health until tonight, she went to visit her niece, walked up three flights of stairs to her niece's apartment (much more exertion than is typical for her) and developed moderate to severe substernal chest pain radiating to the right back and arm, associated with SOB. She rested and this slowly subsided after a few minutes, but shortly after it returned again by itself for 30 minutes, so she called her daughter and went to the ER.  She has had no previous CAD, MI, DVT or PE.  No recent surgery. No cancer.    ED course: -Afebrile, heart rate 91, respirations and pulse ox normal, BP 109/56 -Na 138, K 4.4, Cr 2.64 (baseline 2.3), WBC 9.1K, Hgb 7.7 (11.8 in May, 9 before that) -Troponin negative x2 -D-dimer 0.89 -CXR clear -ECG showed paced rhythm, non-specific lateral ST changes -She had return of chest pain in the ER when walking to the bathroom, resolved with rest -She was given full aspirin and TRH were asked to accept in transfer    There has been no melena, no previous GIB, no NSAIDs, no hematochezia.      ROS: Review of Systems  Respiratory: Positive for shortness of breath.   Cardiovascular: Positive for chest pain and leg swelling. Negative for orthopnea and PND.  All other systems reviewed and are negative.         Past Medical History:  Diagnosis Date  . Depression   . Diabetes mellitus without complication ([place])   . Gout   . Hypertension   . Presence of permanent cardiac pacemaker   . Thyroid disease     Past Surgical  History:  Procedure Laterality Date  . FEMUR FRACTURE SURGERY    . ROTATOR CUFF REPAIR      Social History: Patient lives alone.  The patient walks unassisted.  Nonsmoker.  Was a Doctor, hospital.  From [place]  Allergies  Allergen Reactions  . Sulfa Antibiotics     Family history: Reviewed and family were healthy.  Prior to Admission medications   Medication Sig Start Date End Date Taking? Authorizing Provider  amLODipine (NORVASC) 10 MG tablet Take 10 mg by mouth daily.   Yes [provider]  levothyroxine (SYNTHROID, LEVOTHROID) 100 MCG tablet Take 100 mcg by mouth daily before breakfast.   Yes [provider]  metoprolol tartrate (LOPRESSOR) 25 MG tablet Take 5 mg by mouth 2 (two) times daily.   Yes [provider]  pravastatin (PRAVACHOL) 80 MG tablet Take 80 mg by mouth at bedtime. 02/08/11  Yes [provider]  sodium bicarbonate 650 MG tablet Take 650 mg by mouth 4 (four) times daily.   Yes [provider]  clonazePAM (KLONOPIN) 0.5 MG tablet Take 0.5 mg by mouth 2 (two) times daily as needed for anxiety.    [provider]  diclofenac sodium (VOLTAREN) 1 % GEL Apply 4 g topically 4 (four) times daily. 11/30/15   Palumbo, April, MD  lidocaine (LIDODERM) 5 % Place  1 patch onto the skin daily. Remove & Discard patch within 12 hours or as directed by MD 11/30/15   Randal Buba, April, MD  traMADol (ULTRAM) 50 MG tablet Take 1 tablet (50 mg total) by mouth every 6 (six) hours as needed. 10/02/14   Comer Locket, PA-C       Physical Exam: BP (!) 137/59 (BP Location: Left Arm)   Pulse 77   Temp 98.2 F (36.8 C) (Oral)   Resp 20   Ht 5\' 3"  (1.6 m)   Wt 87.4 kg (192 lb 11.2 oz)   SpO2 100%   BMI 34.14 kg/m  General appearance: Well-developed, elderly adult female, alert and in no acute distress.   Eyes: Anicteric, conjunctiva pink, lids and lashes normal. PERRL.    ENT: No nasal deformity, discharge, epistaxis.  Hearing normal.  OP moist without lesions.  Dentures. Neck: No neck masses.  Trachea midline.  No thyromegaly/tenderness. Lymph: No cervical or supraclavicular lymphadenopathy. Skin: Warm and dry.  No jaundice.  No suspicious rashes or lesions. Cardiac: RRR, nl S1-S2, no murmurs appreciated.  Capillary refill is brisk.  JVP normal.  Trace pretibial LE edema.  Radial and DP pulses 2+ and symmetric. Respiratory: Normal respiratory rate and rhythm.  CTAB without rales or wheezes. Abdomen: Abdomen soft.  No TTP. No ascites, distension, hepatosplenomegaly.   MSK: No deformities or effusions.  No cyanosis or clubbing. Neuro: Cranial nerves normal.  Sensation intact to light touch. Speech is fluent.  Muscle strength normal.    Psych: Sensorium intact and responding to questions, attention normal.  Behavior appropriate.  Affect normal.  Judgment and insight appear normal.     Labs on Admission:  I have personally reviewed following labs and imaging studies: CBC:  Recent Labs Lab 02/12/17 1928  WBC 9.1  NEUTROABS 6.6  HGB 7.7*  HCT 24.2*  MCV 82.6  PLT 578   Basic Metabolic Panel:  Recent Labs Lab 02/12/17 1928  NA 138  K 4.4  CL 108  CO2 18*  GLUCOSE 278*  BUN 60*  CREATININE 2.64*  CALCIUM 8.0*   GFR: Estimated Creatinine Clearance: 16.9 mL/min (A) (by C-G formula based on SCr of 2.64 mg/dL (H)).  Liver Function Tests: No results for input(s): AST, ALT, ALKPHOS, BILITOT, PROT, ALBUMIN in the last 168 hours. No results for input(s): LIPASE, AMYLASE in the last 168 hours. No results for input(s): AMMONIA in the last 168 hours. Coagulation Profile: No results for input(s): INR, PROTIME in the last 168 hours. Cardiac Enzymes:  Recent Labs Lab 02/12/17 1928 02/12/17 2345  TROPONINI <0.03 <0.03   BNP (last 3 results) No results for input(s): PROBNP in the last 8760 hours. HbA1C: No results for input(s): HGBA1C in the last 72 hours. CBG: No results for input(s): GLUCAP in the last 168  hours. Lipid Profile: No results for input(s): CHOL, HDL, LDLCALC, TRIG, CHOLHDL, LDLDIRECT in the last 72 hours. Thyroid Function Tests: No results for input(s): TSH, T4TOTAL, FREET4, T3FREE, THYROIDAB in the last 72 hours. Anemia Panel: No results for input(s): VITAMINB12, FOLATE, FERRITIN, TIBC, IRON, RETICCTPCT in the last 72 hours. Sepsis Labs: Invalid input(s): PROCALCITONIN, LACTICIDVEN No results found for this or any previous visit (from the past 240 hour(s)).       Radiological Exams on Admission: Personally reviewed CXR shows pacer, no airspace disease: Dg Chest 2 View  Result Date: 02/12/2017 CLINICAL DATA:  Shortness of breath EXAM: CHEST  2 VIEW COMPARISON:  11/30/2015 FINDINGS: Left-sided multi lead pacing device  as before. Streaky bibasilar atelectasis. No acute consolidation or pleural effusion. Borderline cardiomegaly. Aortic atherosclerosis. No pneumothorax. Degenerative changes of the spine. IMPRESSION: Minimal basilar atelectasis and borderline cardiomegaly. Negative for acute infiltrate or edema. Electronically Signed   By: Donavan Foil M.D.   On: 02/12/2017 19:48    EKG: Independently reviewed. Rate normal, lateral ST changes, paced rhythm.         Assessment/Plan  1. Chest pain:  Favor angina, likely from anemia, possibly from ACS.  Unfortunately, d-dimer was ordered, despite Wells score 0, and level minimally above age-adjusted cutoff.  HEART score 6, troponin negative.     -Obtain VQ scan -Trend troponin -Monitor tele -Consult Cardiology   2. Hypertension:  -Continue amlodipine, pravastatin -Hold metoprolol  3. Hypothyroidism:  -Continue levothyroxine  4. Anemia:  Likely from chronic renal disease.  Last measure in Care Everywhere was 11 but that was somewhat up from her previous basleine ~9. -Type and screen -Check iron stores, B12, reticulocytes -Transfuse 1 unit      DVT prophylaxis: Lovenox  Code Status: FULL  Family  Communication: Daughter at bedside  Disposition Plan: Anticipate VQ scan, observe for ongoing pain. Consults called: None overnight Admission status: OBS At the point of initial evaluation, it is my clinical opinion that admission for OBSERVATION is reasonable and necessary because the patient's presenting complaints in the context of their chronic conditions represent sufficient risk of deterioration or significant morbidity to constitute reasonable grounds for close observation in the hospital setting, but that the patient may be medically stable for discharge from the hospital within 24 to 48 hours.    Medical decision making: Patient seen at 3:45 AM on 02/13/2017.  The patient was discussed with Dr. Laverta Baltimore.  What exists of the patient's chart was reviewed in depth and summarized above.  Clinical condition: stable.        Edwin Dada [facility] Pager [phone]

## 2017-02-13 NOTE — Progress Notes (Signed)
Report received via Amedeo Gory RN, reviewed VS,labs, meds and patient's general condition, will transfer to [facility] via [facility] ASAP. Patient is stable at this time.

## 2017-02-13 NOTE — Progress Notes (Signed)
Patient arrived to room 3W-14 via [facility] in stable condition, admission assessment completed and Dr Loleta Books called for admission orders, no C/O pain or discomfort at this time, will continue to monitor.

## 2017-02-13 NOTE — Consult Note (Addendum)
Cardiology Consultation:   Patient ID: Molly Cobb; [id number]; 1931/10/14   Admit date: 02/12/2017 Date of Consult: 02/13/2017  Primary Care Provider: Guadlupe Spanish, MD Primary Cardiologist: [facility])  Primary Electrophysiologist:  [facility]    Patient Profile:   Molly Cobb is a 83 y.o. female with a hx of Hypertension, dyslipidemia, stage IV chronic kidney disease, diabetes mellitus, pacemaker who is being seen today for the evaluation of chest discomfort/shortness of breath at the request of Dr. Verlon Au. Marland Kitchen  History of Present Illness:   Molly Cobb is followed in [facility] by the [facility] physicians. She has stage IV chronic kidney disease, diabetes mellitus, and hypertension. She has been followed fairly regularly.  She was recently visiting her niece and walked up 33 flights of stairs to the niece's apartment. She developed substernal chest discomfort with radiation to the back and on. It was associated with significant shortness of breath. This subsided after several minutes but then returned shortly thereafter. She presented to the emergency room for further evaluation.    Past Medical History:  Diagnosis Date  . Depression   . Diabetes mellitus without complication ([place])   . Gout   . Hypertension   . Presence of permanent cardiac pacemaker   . Thyroid disease     Past Surgical History:  Procedure Laterality Date  . FEMUR FRACTURE SURGERY    . ROTATOR CUFF REPAIR       Home Medications:  Prior to Admission medications   Medication Sig Start Date End Date Taking? Authorizing Provider  amLODipine (NORVASC) 10 MG tablet Take 10 mg by mouth daily.   Yes [provider]  levothyroxine (SYNTHROID, LEVOTHROID) 100 MCG tablet Take 100 mcg by mouth daily before breakfast.   Yes [provider]  metoprolol tartrate (LOPRESSOR) 25 MG tablet Take 5 mg by mouth 2 (two) times daily.   Yes [provider]  pravastatin (PRAVACHOL)  80 MG tablet Take 80 mg by mouth at bedtime. 02/08/11  Yes [provider]  sodium bicarbonate 650 MG tablet Take 650 mg by mouth 4 (four) times daily.   Yes [provider]  clonazePAM (KLONOPIN) 0.5 MG tablet Take 0.5 mg by mouth 2 (two) times daily as needed for anxiety.    [provider]  diclofenac sodium (VOLTAREN) 1 % GEL Apply 4 g topically 4 (four) times daily. 11/30/15   Palumbo, April, MD  lidocaine (LIDODERM) 5 % Place 1 patch onto the skin daily. Remove & Discard patch within 12 hours or as directed by MD 11/30/15   Randal Buba, April, MD  traMADol (ULTRAM) 50 MG tablet Take 1 tablet (50 mg total) by mouth every 6 (six) hours as needed. 10/02/14   Comer Locket, PA-C    Inpatient Medications: Scheduled Meds: . amLODipine  10 mg Oral Daily  . enoxaparin (LOVENOX) injection  30 mg Subcutaneous Q24H  . levothyroxine  100 mcg Oral QAC breakfast  . pravastatin  80 mg Oral QHS  . sodium bicarbonate  650 mg Oral QID   Continuous Infusions:  PRN Meds: acetaminophen **OR** acetaminophen  Allergies:    Allergies  Allergen Reactions  . Sulfa Antibiotics     Social History:   Social History   Social History  . Marital status: Widowed    Spouse name: N/A  . Number of children: N/A  . Years of education: N/A   Occupational History  . Not on file.   Social History Main Topics  .  Smoking status: Never Smoker  . Smokeless tobacco: Never Used  . Alcohol use No  . Drug use: No  . Sexual activity: Not on file   Other Topics Concern  . Not on file   Social History Narrative  . No narrative on file    Family History:    Family History  Problem Relation Age of Onset  . Heart disease Neg Hx   . Stroke Neg Hx   . Diabetes Neg Hx      ROS:  Please see the history of present illness.  ROS  All other ROS reviewed and negative.     Physical Exam/Data:   Vitals:   02/13/17 0130 02/13/17 0213 02/13/17 0311 02/13/17 0750  BP: (!) 122/54 123/78  (!) 137/59 (!) 122/52  Pulse: 75 70 77   Resp: 19 18 20 18   Temp:   98.2 F (36.8 C)   TempSrc:   Oral   SpO2: 100% 100% 100%   Weight:   192 lb 11.2 oz (87.4 kg)   Height:   5\' 3"  (1.6 m)     Intake/Output Summary (Last 24 hours) at 02/13/17 0810 Last data filed at 02/13/17 0521  Gross per 24 hour  Intake                0 ml  Output              800 ml  Net             -800 ml   Filed Weights   02/12/17 1859 02/13/17 0311  Weight: 178 lb (80.7 kg) 192 lb 11.2 oz (87.4 kg)   Body mass index is 34.14 kg/m.  General:  Well nourished, well developed, in no acute distress HEENT: normal Lymph: no adenopathy Neck: no JVD Endocrine:  No thryomegaly Vascular: No carotid bruits; FA pulses 2+ bilaterally without bruits  Cardiac:  normal S1, S2; RRR; soft systolic  murmur  Lungs:  clear to auscultation bilaterally, no wheezing, rhonchi or rales  Abd: soft, nontender, no hepatomegaly  Ext: no edema Musculoskeletal:  No deformities, BUE and BLE strength normal and equal Skin: warm and dry  Neuro:  CNs 2-12 intact, no focal abnormalities noted Psych:  Normal affect   EKG:  The EKG was personally reviewed and demonstrates:  A-pacing with V sensing.  NS ST abn.  Telemetry:  Telemetry was personally reviewed and demonstrates:   Atrial pacing   Relevant CV Studies:   Laboratory Data:  Chemistry Recent Labs Lab 02/12/17 1928  NA 138  K 4.4  CL 108  CO2 18*  GLUCOSE 278*  BUN 60*  CREATININE 2.64*  CALCIUM 8.0*  GFRNONAA 16*  GFRAA 18*  ANIONGAP 12    No results for input(s): PROT, ALBUMIN, AST, ALT, ALKPHOS, BILITOT in the last 168 hours. Hematology Recent Labs Lab 02/12/17 1928 02/13/17 0404  WBC 9.1 8.1  RBC 2.93* 3.12*  3.12*  HGB 7.7* 7.9*  HCT 24.2* 25.3*  MCV 82.6 81.1  MCH 26.3 25.3*  MCHC 31.8 31.2  RDW 14.0 14.4  PLT 212 223   Cardiac Enzymes Recent Labs Lab 02/12/17 1928 02/12/17 2345 02/13/17 0404  TROPONINI <0.03 <0.03 <0.03   No results  for input(s): TROPIPOC in the last 168 hours.  BNPNo results for input(s): BNP, PROBNP in the last 168 hours.  DDimer  Recent Labs Lab 02/12/17 1928  DDIMER 0.89*    Radiology/Studies:  Dg Chest 2 View  Result Date: 02/12/2017 CLINICAL  DATA:  Shortness of breath EXAM: CHEST  2 VIEW COMPARISON:  11/30/2015 FINDINGS: Left-sided multi lead pacing device as before. Streaky bibasilar atelectasis. No acute consolidation or pleural effusion. Borderline cardiomegaly. Aortic atherosclerosis. No pneumothorax. Degenerative changes of the spine. IMPRESSION: Minimal basilar atelectasis and borderline cardiomegaly. Negative for acute infiltrate or edema. Electronically Signed   By: Donavan Foil M.D.   On: 02/12/2017 19:48    Assessment and Plan:   1. Chest discomfort: This is [place] presents with episode of chest discomfort related to exertion. She has fairly significant anemia. She has had anemia in the past. She's had stress testing several years ago.   She's currently pain-free. Troponin levels are negative.  We will schedule her for a [place] study today. We will consider further options basilar results of the stress Myoview study.  2. Anemia: Further plans per internal medicine.  3. Pacemaker: She's currently atrial pacing. This is been followed by Dr. Lewayne Bunting in [facility].   4.  Essential HTN:    BP is currently well controlled.    Signed, Mertie Moores, MD  02/13/2017 8:10 AM

## 2017-02-13 NOTE — Plan of Care (Signed)
Problem: Safety: Goal: Ability to remain free from injury will improve Outcome: Completed/Met Date Met: 02/13/17 Patient uses the call light or phone to call RN/NT with numbers on white board as instructed and waits for assistance, all personal belongings within reach. Patient is also aware of bed alarm on at night and is now reminding me to put it on at night.

## 2017-02-13 NOTE — Plan of Care (Signed)
Problem: Education: Goal: Understanding of cardiac disease, CV risk reduction, and recovery process will improve Outcome: Progressing Reviewed stress test for am and informed patient of need to remain NPO after midnight, all questions answered and patient verbalized understanding.

## 2017-02-14 ENCOUNTER — Observation Stay (HOSPITAL_COMMUNITY): Payer: Medicare Other

## 2017-02-14 DIAGNOSIS — R195 Other fecal abnormalities: Secondary | ICD-10-CM | POA: Diagnosis not present

## 2017-02-14 DIAGNOSIS — I495 Sick sinus syndrome: Secondary | ICD-10-CM | POA: Diagnosis not present

## 2017-02-14 DIAGNOSIS — K2990 Gastroduodenitis, unspecified, without bleeding: Secondary | ICD-10-CM | POA: Diagnosis present

## 2017-02-14 DIAGNOSIS — R079 Chest pain, unspecified: Secondary | ICD-10-CM | POA: Diagnosis not present

## 2017-02-14 DIAGNOSIS — K921 Melena: Secondary | ICD-10-CM | POA: Diagnosis not present

## 2017-02-14 DIAGNOSIS — E872 Acidosis: Secondary | ICD-10-CM | POA: Diagnosis present

## 2017-02-14 DIAGNOSIS — E785 Hyperlipidemia, unspecified: Secondary | ICD-10-CM | POA: Diagnosis present

## 2017-02-14 DIAGNOSIS — I129 Hypertensive chronic kidney disease with stage 1 through stage 4 chronic kidney disease, or unspecified chronic kidney disease: Secondary | ICD-10-CM | POA: Diagnosis not present

## 2017-02-14 DIAGNOSIS — E11319 Type 2 diabetes mellitus with unspecified diabetic retinopathy without macular edema: Secondary | ICD-10-CM | POA: Diagnosis present

## 2017-02-14 DIAGNOSIS — I25118 Atherosclerotic heart disease of native coronary artery with other forms of angina pectoris: Secondary | ICD-10-CM | POA: Diagnosis present

## 2017-02-14 DIAGNOSIS — Z6834 Body mass index (BMI) 34.0-34.9, adult: Secondary | ICD-10-CM | POA: Diagnosis not present

## 2017-02-14 DIAGNOSIS — I10 Essential (primary) hypertension: Secondary | ICD-10-CM | POA: Diagnosis not present

## 2017-02-14 DIAGNOSIS — E1140 Type 2 diabetes mellitus with diabetic neuropathy, unspecified: Secondary | ICD-10-CM | POA: Diagnosis present

## 2017-02-14 DIAGNOSIS — Z961 Presence of intraocular lens: Secondary | ICD-10-CM | POA: Diagnosis present

## 2017-02-14 DIAGNOSIS — I130 Hypertensive heart and chronic kidney disease with heart failure and stage 1 through stage 4 chronic kidney disease, or unspecified chronic kidney disease: Secondary | ICD-10-CM | POA: Diagnosis present

## 2017-02-14 DIAGNOSIS — K319 Disease of stomach and duodenum, unspecified: Secondary | ICD-10-CM | POA: Diagnosis not present

## 2017-02-14 DIAGNOSIS — Z01810 Encounter for preprocedural cardiovascular examination: Secondary | ICD-10-CM | POA: Diagnosis not present

## 2017-02-14 DIAGNOSIS — E669 Obesity, unspecified: Secondary | ICD-10-CM | POA: Diagnosis present

## 2017-02-14 DIAGNOSIS — F419 Anxiety disorder, unspecified: Secondary | ICD-10-CM | POA: Diagnosis present

## 2017-02-14 DIAGNOSIS — R072 Precordial pain: Secondary | ICD-10-CM

## 2017-02-14 DIAGNOSIS — I248 Other forms of acute ischemic heart disease: Secondary | ICD-10-CM | POA: Diagnosis present

## 2017-02-14 DIAGNOSIS — K2970 Gastritis, unspecified, without bleeding: Secondary | ICD-10-CM | POA: Diagnosis not present

## 2017-02-14 DIAGNOSIS — E890 Postprocedural hypothyroidism: Secondary | ICD-10-CM | POA: Diagnosis present

## 2017-02-14 DIAGNOSIS — D649 Anemia, unspecified: Secondary | ICD-10-CM | POA: Diagnosis not present

## 2017-02-14 DIAGNOSIS — I2584 Coronary atherosclerosis due to calcified coronary lesion: Secondary | ICD-10-CM | POA: Diagnosis present

## 2017-02-14 DIAGNOSIS — Z833 Family history of diabetes mellitus: Secondary | ICD-10-CM | POA: Diagnosis not present

## 2017-02-14 DIAGNOSIS — E039 Hypothyroidism, unspecified: Secondary | ICD-10-CM | POA: Diagnosis not present

## 2017-02-14 DIAGNOSIS — E1122 Type 2 diabetes mellitus with diabetic chronic kidney disease: Secondary | ICD-10-CM | POA: Diagnosis present

## 2017-02-14 DIAGNOSIS — E1121 Type 2 diabetes mellitus with diabetic nephropathy: Secondary | ICD-10-CM | POA: Diagnosis present

## 2017-02-14 DIAGNOSIS — Z950 Presence of cardiac pacemaker: Secondary | ICD-10-CM | POA: Diagnosis not present

## 2017-02-14 DIAGNOSIS — I5032 Chronic diastolic (congestive) heart failure: Secondary | ICD-10-CM | POA: Diagnosis present

## 2017-02-14 DIAGNOSIS — I350 Nonrheumatic aortic (valve) stenosis: Secondary | ICD-10-CM | POA: Diagnosis not present

## 2017-02-14 DIAGNOSIS — D631 Anemia in chronic kidney disease: Secondary | ICD-10-CM | POA: Diagnosis present

## 2017-02-14 DIAGNOSIS — K31811 Angiodysplasia of stomach and duodenum with bleeding: Secondary | ICD-10-CM | POA: Diagnosis present

## 2017-02-14 DIAGNOSIS — D500 Iron deficiency anemia secondary to blood loss (chronic): Secondary | ICD-10-CM | POA: Diagnosis present

## 2017-02-14 DIAGNOSIS — I352 Nonrheumatic aortic (valve) stenosis with insufficiency: Secondary | ICD-10-CM | POA: Diagnosis present

## 2017-02-14 DIAGNOSIS — N184 Chronic kidney disease, stage 4 (severe): Secondary | ICD-10-CM | POA: Diagnosis present

## 2017-02-14 LAB — PREPARE RBC (CROSSMATCH)

## 2017-02-14 LAB — GLUCOSE, CAPILLARY
GLUCOSE-CAPILLARY: 155 mg/dL — AB (ref 65–99)
GLUCOSE-CAPILLARY: 209 mg/dL — AB (ref 65–99)
GLUCOSE-CAPILLARY: 143 mg/dL — AB (ref 65–99)

## 2017-02-14 LAB — NM MYOCAR MULTI W/SPECT W/WALL MOTION / EF
Peak HR: 110 {beats}/min
Rest HR: 86 {beats}/min

## 2017-02-14 MED ORDER — TECHNETIUM TC 99M TETROFOSMIN IV KIT
30.0000 | PACK | Freq: Once | INTRAVENOUS | Status: AC | PRN
  Administered 2017-02-14: 30 via INTRAVENOUS

## 2017-02-14 MED ORDER — REGADENOSON 0.4 MG/5ML IV SOLN
0.4000 mg | Freq: Once | INTRAVENOUS | Status: AC
  Administered 2017-02-14: 0.4 mg via INTRAVENOUS
  Filled 2017-02-14: qty 5

## 2017-02-14 MED ORDER — TECHNETIUM TC 99M TETROFOSMIN IV KIT
10.0000 | PACK | Freq: Once | INTRAVENOUS | Status: AC | PRN
  Administered 2017-02-14: 10 via INTRAVENOUS

## 2017-02-14 MED ORDER — DIPHENHYDRAMINE HCL 25 MG PO CAPS
25.0000 mg | ORAL_CAPSULE | Freq: Once | ORAL | Status: AC
  Administered 2017-02-14: 25 mg via ORAL
  Filled 2017-02-14: qty 1

## 2017-02-14 MED ORDER — SODIUM CHLORIDE 0.9 % IV SOLN
Freq: Once | INTRAVENOUS | Status: AC
  Administered 2017-02-14: 13:00:00 via INTRAVENOUS

## 2017-02-14 MED ORDER — REGADENOSON 0.4 MG/5ML IV SOLN
INTRAVENOUS | Status: AC
  Administered 2017-02-14: 0.4 mg via INTRAVENOUS
  Filled 2017-02-14: qty 5

## 2017-02-14 MED ORDER — FUROSEMIDE 10 MG/ML IJ SOLN
20.0000 mg | Freq: Once | INTRAMUSCULAR | Status: AC
  Administered 2017-02-14: 20 mg via INTRAVENOUS
  Filled 2017-02-14: qty 2

## 2017-02-14 NOTE — Progress Notes (Signed)
   Florestine Avers presented for a nuclear stress test today.  No immediate complications.  Stress imaging is pending at this time.  Preliminary EKG findings may be listed in the chart, but the stress test result will not be finalized until perfusion imaging is complete.  Charlie Pitter, PA-C 02/14/2017, 9:53 AM

## 2017-02-14 NOTE — Progress Notes (Signed)
Nuclear stress test was low risk - minimal peri-infarct ischemia and mildly reduced left ventricular global systolic function. EF 48%. D/w Dr. Burt Knack who recommends medical therapy for chest discomfort and further management of anemia by primary service. 2D echo still pending. Will f/u result in AM. D/w Dr. Verlon Au who is planning transfusion. Also d/w patient and daughter.  Wilfrid Hyser PA-C

## 2017-02-14 NOTE — Progress Notes (Signed)
Patient ambulated in hall on room air, did not become dyspneic, but heart rate went from 110's to 130's, and patient stated that she felt a little chest pressure.

## 2017-02-14 NOTE — Progress Notes (Addendum)
Progress Note  Patient Name: Molly Cobb Date of Encounter: 02/14/2017  Primary Cardiologist: Dr. Lewayne Bunting  Subjective   No chest pain this AM. Feels well.  Inpatient Medications    Scheduled Meds: . regadenoson      . amLODipine  10 mg Oral Daily  . enoxaparin (LOVENOX) injection  30 mg Subcutaneous Q24H  . insulin aspart  0-9 Units Subcutaneous TID WC  . insulin aspart  3 Units Subcutaneous TID WC  . levothyroxine  100 mcg Oral QAC breakfast  . pravastatin  80 mg Oral QHS  . regadenoson  0.4 mg Intravenous Once  . sodium bicarbonate  650 mg Oral QID   Continuous Infusions:  PRN Meds: acetaminophen **OR** acetaminophen   Vital Signs    Vitals:   02/13/17 2144 02/14/17 0346 02/14/17 0858 02/14/17 0936  BP: (!) 116/52 (!) 131/52 (!) 150/63 (!) 150/63  Pulse: 72 83 75 80  Resp: 14 16    Temp: 98.7 F (37.1 C) 98.3 F (36.8 C)    TempSrc: Oral Oral    SpO2: 100% 90%    Weight:  189 lb 4.8 oz (85.9 kg)    Height:        Intake/Output Summary (Last 24 hours) at 02/14/17 0944 Last data filed at 02/14/17 0845  Gross per 24 hour  Intake              120 ml  Output                0 ml  Net              120 ml   Filed Weights   02/12/17 1859 02/13/17 0311 02/14/17 0346  Weight: 178 lb (80.7 kg) 192 lb 11.2 oz (87.4 kg) 189 lb 4.8 oz (85.9 kg)    Telemetry    Atrial paced - Personally Reviewed   Physical Exam   GEN: No acute distress.  HEENT: Normocephalic, atraumatic, sclera non-icteric. Neck: No JVD or bruits. Cardiac: RRR soft SEM LUSB  No rubs or gallops.  Radials/DP/PT 1+ and equal bilaterally.  Respiratory: Clear to auscultation bilaterally. Breathing is unlabored. GI: Soft, nontender, non-distended, BS +x 4. MS: no deformity. Extremities: No clubbing or cyanosis. No edema. Distal pedal pulses are 2+ and equal bilaterally. Neuro:  AAOx3. Follows commands. Psych:  Responds to questions appropriately with a normal affect.  Labs     Chemistry Recent Labs Lab 02/12/17 1928  NA 138  K 4.4  CL 108  CO2 18*  GLUCOSE 278*  BUN 60*  CREATININE 2.64*  CALCIUM 8.0*  GFRNONAA 16*  GFRAA 18*  ANIONGAP 12     Hematology Recent Labs Lab 02/12/17 1928 02/13/17 0404  WBC 9.1 8.1  RBC 2.93* 3.12*  3.12*  HGB 7.7* 7.9*  HCT 24.2* 25.3*  MCV 82.6 81.1  MCH 26.3 25.3*  MCHC 31.8 31.2  RDW 14.0 14.4  PLT 212 223    Cardiac Enzymes Recent Labs Lab 02/12/17 1928 02/12/17 2345 02/13/17 0404  TROPONINI <0.03 <0.03 <0.03   No results for input(s): TROPIPOC in the last 168 hours.   BNPNo results for input(s): BNP, PROBNP in the last 168 hours.   DDimer  Recent Labs Lab 02/12/17 1928  DDIMER 0.89*     Radiology    Dg Chest 2 View  Result Date: 02/12/2017 CLINICAL DATA:  Shortness of breath EXAM: CHEST  2 VIEW COMPARISON:  11/30/2015 FINDINGS: Left-sided multi lead pacing device as before. Streaky bibasilar atelectasis.  No acute consolidation or pleural effusion. Borderline cardiomegaly. Aortic atherosclerosis. No pneumothorax. Degenerative changes of the spine. IMPRESSION: Minimal basilar atelectasis and borderline cardiomegaly. Negative for acute infiltrate or edema. Electronically Signed   By: Donavan Foil M.D.   On: 02/12/2017 19:48   Nm Pulmonary Perf And Vent  Result Date: 02/13/2017 CLINICAL DATA:  Substernal chest pain radiating to the back. Shortness of breath. EXAM: NUCLEAR MEDICINE VENTILATION - PERFUSION LUNG SCAN TECHNIQUE: Ventilation images were obtained in multiple projections using inhaled aerosol Tc-44m DTPA. Perfusion images were obtained in multiple projections after intravenous injection of Tc-36m MAA. RADIOPHARMACEUTICALS:  32.2 mCi Technetium-63m DTPA aerosol inhalation and 4.24 mCi Technetium-83m MAA IV COMPARISON:  None. FINDINGS: Ventilation: No focal ventilation defect. Perfusion: No wedge shaped peripheral perfusion defects to suggest acute pulmonary embolism. IMPRESSION: No  scintigraphic evidence of pulmonary embolus. Electronically Signed   By: Kathreen Devoid   On: 02/13/2017 10:55    Cardiac Studies   Pending   Patient Profile     83 y.o. female with Hypertension, dyslipidemia, stage IV chronic kidney disease, diabetes mellitus, pacemaker, anemia, heart murmur whom we were asked to see for chest pain.  Assessment & Plan    1. Chest discomfort - troponins negative. D-dimer high but VQ negative. Seen briefly down in nuclear medicine - pending images. ?R/t anemia.  2. Heart murmur - do not see prior echo in care everywhere. Will order.  3. Anemia - fairly significant, Hgb in 7 range. Prior Hemoglobin in CareEverywhere was 11.8 in 10/2016. Previously in the 9 range in 2017. She reports dark stools 2 weeks ago. This would need to be evaluated prior to any invasive ischemic assessment, further per primary team.  4. CKD stage IV - prior baseline Cr appears 2.2. Per IM.  Signed, Charlie Pitter, PA-C  02/14/2017, 9:44 AM    Patient seen, examined. Available data reviewed. Agree with findings, assessment, and plan as outlined by Melina Copa, PA-C. Exam reveals an alert, oriented woman in no distress. Heart is regular rate and rhythm with a grade 3/6 holosystolic murmur at the apex, no diastolic murmur. Abdomen is soft and nontender. No peripheral edema.  All data is reviewed. The patient had an episode of chest discomfort after walking up the stairs and becoming short of breath at her family reunion this weekend. Her troponin is negative. She has significant anemia and chronic kidney disease. She has undergone pharmacologic stress testing and we are awaiting the results. I would have a very high threshold to do a cardiac catheterization in this elderly patient with anemia and kidney disease. Unless her stress test results are very high risk I would be inclined to treat her medically. She has had no further chest pain since hospitalization. Other management of her medical  problems per the primary service. Will check and echo to evaluate her heart murmur.  [name], M.D. 02/14/2017 2:08 PM

## 2017-02-14 NOTE — Progress Notes (Signed)
PROGRESS NOTE    Molly Cobb  ZOX:[id number] DOB: 03-30-1932 DOA: 02/12/2017 PCP: Guadlupe Spanish, MD   Specialists:   Laqueta Linden, MD Nwobu, Melanee Left, MD   Brief Narrative:  83 year old African-American ? HTN History of hyperlipidemia Stage IV kidney disease, RTA  DM TY2 with neuropathy and retinopathy PPM Sjogren's syndrome?, Pseudophakic in both eyes History Medtronic pacemaker Acquired hypothyroidism  Admitted with CP after lifting a couple bottles of water up many flights of stairs    Assessment & Plan:   Principal Problem:   Chest pain Active Problems:   Benign hypertension with CKD (chronic kidney disease) stage IV (HCC)   Hypothyroidism (acquired)   Type 2 diabetes mellitus with stage 4 chronic kidney disease, without long-term current use of insulin (HCC)   SSS (sick sinus syndrome) (HCC)   Normocytic anemia   Chest pain  Probably low risk, Myoview and VQ scan did not show high-risk features  Echocardiogram pending  See below  Symptomatic anemia secondary to microscopic blood loss  Poor perfusion = dyspnea equivalent = took aspirin multiple tablets a couple of weeks ago and had a couple of her stools which has resolved  Spoke with gastroenterology who recommended follow-up with primary care physicians who have seen her, will hold aspirin on discharge  Transfuse 2 units PRBC 8/20 repeat labs a.m. and if all stable hospital discharge home  Iron level 40, saturation ratio however is low at 10--repeat as outpatient, if remains low may be benefit for IV iron and eventual erythropoietin  Stage IV CK D, metabolic acidosis secondary to this RTA Nephropathy from, HTN, DM Anemia renal disease  Creatinine 60/2.64 GFR about 18  Follow up with [place] nephrologist  Continue bicarbonate 650 4 times a day  Pacemaker placement  Details unclear  Pacedand telemetry is benign   Sjogren's syndrome, pseudophakia both eyes  Unclear if actual  Sjogren's  Follow up with outpatient ophthalmologist, continue eyedrops  Acquired hypothyroidism DM TY 2  Thyroidectomy in the past, check TSH outpatient  Sugars in the 100s, continue sliding scale coverage   SCD Discussed with family member Inpatient Expected discharge one to 2 days  Consultants:   Cardiology  Procedures:   Myoview stress 8/20  Antimicrobials:   None    Subjective: Alert pleasant oriented Eating drinking No chest pain No weakness unilaterally no visual issues   Objective: Vitals:   02/14/17 0946 02/14/17 1245 02/14/17 1305 02/14/17 1330  BP: (!) 125/58 117/64 117/64 140/86  Pulse: (!) 101     Resp:   (!) 21   Temp:  97.9 F (36.6 C)  98 F (36.7 C)  TempSrc:  Oral  Oral  SpO2:      Weight:      Height:        Intake/Output Summary (Last 24 hours) at 02/14/17 1517 Last data filed at 02/14/17 0845  Gross per 24 hour  Intake              120 ml  Output                0 ml  Net              120 ml   Filed Weights   02/12/17 1859 02/13/17 0311 02/14/17 0346  Weight: 80.7 kg (178 lb) 87.4 kg (192 lb 11.2 oz) 85.9 kg (189 lb 4.8 oz)    Examination:    Data Reviewed: I have personally reviewed following labs and imaging studies  CBC:  Recent Labs Lab 02/12/17 1928 02/13/17 0404  WBC 9.1 8.1  NEUTROABS 6.6  --   HGB 7.7* 7.9*  HCT 24.2* 25.3*  MCV 82.6 81.1  PLT 212 967   Basic Metabolic Panel:  Recent Labs Lab 02/12/17 1928  NA 138  K 4.4  CL 108  CO2 18*  GLUCOSE 278*  BUN 60*  CREATININE 2.64*  CALCIUM 8.0*   GFR: Estimated Creatinine Clearance: 16.8 mL/min (A) (by C-G formula based on SCr of 2.64 mg/dL (H)). Liver Function Tests: No results for input(s): AST, ALT, ALKPHOS, BILITOT, PROT, ALBUMIN in the last 168 hours. No results for input(s): LIPASE, AMYLASE in the last 168 hours. No results for input(s): AMMONIA in the last 168 hours. Coagulation Profile: No results for input(s): INR, PROTIME in the  last 168 hours. Cardiac Enzymes:  Recent Labs Lab 02/12/17 1928 02/12/17 2345 02/13/17 0404  TROPONINI <0.03 <0.03 <0.03   BNP (last 3 results) No results for input(s): PROBNP in the last 8760 hours. HbA1C: No results for input(s): HGBA1C in the last 72 hours. CBG:  Recent Labs Lab 02/13/17 1131 02/13/17 1657 02/13/17 1802 02/13/17 2142 02/14/17 1119  GLUCAP 124* 332* 287* 157* 155*   Lipid Profile: No results for input(s): CHOL, HDL, LDLCALC, TRIG, CHOLHDL, LDLDIRECT in the last 72 hours. Thyroid Function Tests: No results for input(s): TSH, T4TOTAL, FREET4, T3FREE, THYROIDAB in the last 72 hours. Anemia Panel:  Recent Labs  02/13/17 0404  VITAMINB12 2,095*  FERRITIN 12  TIBC 382  IRON 40  RETICCTPCT 2.6   Urine analysis:    Component Value Date/Time   COLORURINE YELLOW 05/13/2008 0915   APPEARANCEUR CLEAR 05/13/2008 0915   LABSPEC 1.007 05/13/2008 0915   PHURINE 5.0 05/13/2008 0915   GLUCOSEU NEGATIVE 05/13/2008 0915   HGBUR NEGATIVE 05/13/2008 0915   BILIRUBINUR NEGATIVE 05/13/2008 0915   KETONESUR NEGATIVE 05/13/2008 0915   PROTEINUR NEGATIVE 05/13/2008 0915   UROBILINOGEN 0.2 05/13/2008 0915   NITRITE NEGATIVE 05/13/2008 0915   LEUKOCYTESUR SMALL (A) 05/13/2008 0915     Radiology Studies: Reviewed images personally in health database    Scheduled Meds: . amLODipine  10 mg Oral Daily  . enoxaparin (LOVENOX) injection  30 mg Subcutaneous Q24H  . insulin aspart  0-9 Units Subcutaneous TID WC  . insulin aspart  3 Units Subcutaneous TID WC  . levothyroxine  100 mcg Oral QAC breakfast  . pravastatin  80 mg Oral QHS  . sodium bicarbonate  650 mg Oral QID   Continuous Infusions:   LOS: 0 days    Time spent: [place]ricks, MD [facility] ([facility]   If 7PM-7AM, please contact night-coverage [email or web address] Password [email or web address] 02/14/2017, 3:17 PM

## 2017-02-15 ENCOUNTER — Observation Stay (HOSPITAL_COMMUNITY): Payer: Medicare Other

## 2017-02-15 DIAGNOSIS — I350 Nonrheumatic aortic (valve) stenosis: Secondary | ICD-10-CM

## 2017-02-15 LAB — BPAM RBC
BLOOD PRODUCT EXPIRATION DATE: 201808242359
Blood Product Expiration Date: 201808242359
ISSUE DATE / TIME: 201808201245
ISSUE DATE / TIME: 201808201827
UNIT TYPE AND RH: 9500
Unit Type and Rh: 9500

## 2017-02-15 LAB — CBC
HEMATOCRIT: 33.1 % — AB (ref 36.0–46.0)
HEMATOCRIT: 35.7 % — AB (ref 36.0–46.0)
HEMOGLOBIN: 10.7 g/dL — AB (ref 12.0–15.0)
Hemoglobin: 11.5 g/dL — ABNORMAL LOW (ref 12.0–15.0)
MCH: 26.2 pg (ref 26.0–34.0)
MCH: 26.6 pg (ref 26.0–34.0)
MCHC: 32.3 g/dL (ref 30.0–36.0)
MCHC: 32.2 g/dL (ref 30.0–36.0)
MCV: 81.1 fL (ref 78.0–100.0)
MCV: 82.4 fL (ref 78.0–100.0)
PLATELETS: 221 10*3/uL (ref 150–400)
Platelets: 204 10*3/uL (ref 150–400)
RBC: 4.08 MIL/uL (ref 3.87–5.11)
RBC: 4.33 MIL/uL (ref 3.87–5.11)
RDW: 14.6 % (ref 11.5–15.5)
RDW: 15.1 % (ref 11.5–15.5)
WBC: 8.5 10*3/uL (ref 4.0–10.5)
WBC: 8.1 10*3/uL (ref 4.0–10.5)

## 2017-02-15 LAB — TYPE AND SCREEN
ABO/RH(D): O NEG
ANTIBODY SCREEN: POSITIVE
DAT, IGG: NEGATIVE
Unit division: 0
Unit division: 0

## 2017-02-15 LAB — BASIC METABOLIC PANEL
ANION GAP: 7 (ref 5–15)
BUN: 46 mg/dL — ABNORMAL HIGH (ref 6–20)
CHLORIDE: 111 mmol/L (ref 101–111)
CO2: 23 mmol/L (ref 22–32)
CREATININE: 2.39 mg/dL — AB (ref 0.44–1.00)
Calcium: 8.6 mg/dL — ABNORMAL LOW (ref 8.9–10.3)
GFR calc Af Amer: 20 mL/min — ABNORMAL LOW (ref >60)
GFR calc non Af Amer: 18 mL/min — ABNORMAL LOW (ref >60)
Glucose, Bld: 136 mg/dL — ABNORMAL HIGH (ref 65–99)
POTASSIUM: 4.5 mmol/L (ref 3.5–5.1)
Sodium: 141 mmol/L (ref 135–145)

## 2017-02-15 LAB — GLUCOSE, CAPILLARY
GLUCOSE-CAPILLARY: 151 mg/dL — AB (ref 65–99)
GLUCOSE-CAPILLARY: 124 mg/dL — AB (ref 65–99)
Glucose-Capillary: 125 mg/dL — ABNORMAL HIGH (ref 65–99)
Glucose-Capillary: 108 mg/dL — ABNORMAL HIGH (ref 65–99)

## 2017-02-15 LAB — ECHOCARDIOGRAM COMPLETE
Height: 63 in
WEIGHTICAEL: 3011.2 [oz_av]

## 2017-02-15 MED ORDER — CLONAZEPAM 0.5 MG PO TABS: 0.2500 mg | ORAL_TABLET | Freq: Two times a day (BID) | ORAL | Status: DC | PRN

## 2017-02-15 MED ORDER — SODIUM CHLORIDE 0.9 % WEIGHT BASED INFUSION
3.0000 mL/kg/h | INTRAVENOUS | Status: DC
  Administered 2017-02-16: 3 mL/kg/h via INTRAVENOUS

## 2017-02-15 MED ORDER — SODIUM CHLORIDE 0.9% FLUSH
3.0000 mL | Freq: Two times a day (BID) | INTRAVENOUS | Status: DC
  Administered 2017-02-15 – 2017-02-16 (×2): 3 mL via INTRAVENOUS

## 2017-02-15 MED ORDER — SODIUM CHLORIDE 0.9 % IV SOLN: 250.0000 mL | INTRAVENOUS | Status: DC | PRN

## 2017-02-15 MED ORDER — SODIUM CHLORIDE 0.9 % WEIGHT BASED INFUSION: 1.0000 mL/kg/h | INTRAVENOUS | Status: DC

## 2017-02-15 MED ORDER — SODIUM CHLORIDE 0.9% FLUSH: 3.0000 mL | INTRAVENOUS | Status: DC | PRN

## 2017-02-15 MED ORDER — METOPROLOL TARTRATE 12.5 MG HALF TABLET
12.5000 mg | ORAL_TABLET | Freq: Two times a day (BID) | ORAL | Status: DC
  Administered 2017-02-15 – 2017-02-18 (×6): 12.5 mg via ORAL
  Filled 2017-02-15 (×6): qty 1

## 2017-02-15 NOTE — Progress Notes (Signed)
  Echocardiogram 2D Echocardiogram has been performed.  Molly Cobb 02/15/2017, 11:36 AM

## 2017-02-15 NOTE — Progress Notes (Addendum)
Progress Note  Patient Name: Molly Cobb Date of Encounter: 02/15/2017  Primary Cardiologist: Dr Lewayne Bunting  Subjective   Patient doing fine this morning. Reports no chest pain or shortness of breath overnight.  Inpatient Medications    Scheduled Meds: . amLODipine  10 mg Oral Daily  . enoxaparin (LOVENOX) injection  30 mg Subcutaneous Q24H  . insulin aspart  0-9 Units Subcutaneous TID WC  . insulin aspart  3 Units Subcutaneous TID WC  . levothyroxine  100 mcg Oral QAC breakfast  . pravastatin  80 mg Oral QHS  . sodium bicarbonate  650 mg Oral QID   Continuous Infusions:  PRN Meds: acetaminophen **OR** acetaminophen   Vital Signs    Vitals:   02/14/17 1900 02/14/17 2003 02/14/17 2117 02/15/17 0547  BP: (!) 124/50 134/69 (!) 127/42 (!) 146/53  Pulse:  82 72 75  Resp:  (!) 26 20 18   Temp: 98.2 F (36.8 C)  99.1 F (37.3 C) 98.3 F (36.8 C)  TempSrc: Oral Oral Oral Oral  SpO2: 100% 100% 99% 98%  Weight:    188 lb 3.2 oz (85.4 kg)  Height:        Intake/Output Summary (Last 24 hours) at 02/15/17 1026 Last data filed at 02/15/17 0900  Gross per 24 hour  Intake             1363 ml  Output                0 ml  Net             1363 ml   Filed Weights   02/13/17 0311 02/14/17 0346 02/15/17 0547  Weight: 192 lb 11.2 oz (87.4 kg) 189 lb 4.8 oz (85.9 kg) 188 lb 3.2 oz (85.4 kg)    Telemetry    Atrial pacing - Personally Reviewed   Physical Exam  Pleasant elderly woman GEN: No acute distress.   Neck: No JVD Cardiac: RRR, 3/6 harsh late peaking systolic murmur at the right upper sternal border Respiratory: Clear to auscultation bilaterally. GI: Soft, nontender, non-distended  MS: No edema; No deformity. Neuro:  Nonfocal  Psych: Normal affect   Labs    Chemistry Recent Labs Lab 02/12/17 1928 02/15/17 0439  NA 138 141  K 4.4 4.5  CL 108 111  CO2 18* 23  GLUCOSE 278* 136*  BUN 60* 46*  CREATININE 2.64* 2.39*  CALCIUM 8.0* 8.6*  GFRNONAA 16* 18*    GFRAA 18* 20*  ANIONGAP 12 7     Hematology Recent Labs Lab 02/12/17 1928 02/13/17 0404 02/15/17 0439  WBC 9.1 8.1 8.5  RBC 2.93* 3.12*  3.12* 4.08  HGB 7.7* 7.9* 10.7*  HCT 24.2* 25.3* 33.1*  MCV 82.6 81.1 81.1  MCH 26.3 25.3* 26.2  MCHC 31.8 31.2 32.3  RDW 14.0 14.4 14.6  PLT 212 223 204    Cardiac Enzymes Recent Labs Lab 02/12/17 1928 02/12/17 2345 02/13/17 0404  TROPONINI <0.03 <0.03 <0.03   No results for input(s): TROPIPOC in the last 168 hours.   BNPNo results for input(s): BNP, PROBNP in the last 168 hours.   DDimer  Recent Labs Lab 02/12/17 1928  DDIMER 0.89*     Radiology    Nm Myocar Multi W/spect W/wall Motion / Ef  Result Date: 02/14/2017  There was no ST segment deviation noted during stress.  Defect 1: There is a medium defect of moderate severity present in the basal inferior, basal inferolateral, mid inferior, mid inferolateral and  apical inferior location.  Findings consistent with prior myocardial infarction.  This is a low risk study.  The left ventricular ejection fraction is mildly decreased (45-54%).  Nuclear stress EF: 48%.  Low risk stress nuclear study with inferior scar with minimal peri-infarct ischemia and mildly reduced left ventricular global systolic function.    Cardiac Studies   Myoview stress test 02/14/2017:  There was no ST segment deviation noted during stress.  Defect 1: There is a medium defect of moderate severity present in the basal inferior, basal inferolateral, mid inferior, mid inferolateral and apical inferior location.  Findings consistent with prior myocardial infarction.  This is a low risk study.  The left ventricular ejection fraction is mildly decreased (45-54%).  Nuclear stress EF: 48%.   Low risk stress nuclear study with inferior scar with minimal peri-infarct ischemia and mildly reduced left ventricular global systolic function.  2-D echocardiogram currently pending  Patient Profile      83 y.o. female Hypertension, dyslipidemia, stage IV chronic kidney disease, diabetes mellitus, pacemaker, anemia, heart murmur whom we were asked to see for chest pain.   Assessment & Plan    1. Chest pain: As per yesterday's note, the patient has negative troponins and may have had some degree of demand ischemia in the context of severe anemia. Her stress test is low risk. Considering her advanced age and stage IV chronic kidney disease medical therapy is most appropriate.  2. Heart murmur: Echo is being performed. It appears that she has aortic stenosis. Will await complete echo study and report but this can be further evaluated as an outpatient.  3. Anemia: The patient has undergone blood transfusion. Management per the primary service.  4. Stage IV chronic kidney disease: The patient's creatinine is stable at 2.39 mg/dL.  Disposition: The patient is stable from a cardiac perspective. Will review echo findings with her once the study is completed. Likely ready for discharge today.  Deatra James, MD  02/15/2017, 10:26 AM    Echo reviewed. Pt has severe aortic stenosis with mean transaortic gradient of 61 mmHg. She hasn't been too symptomatic until recently when she has had significant anemia. She now admits to recent episodes of DOE and chest discomfort with physical exertion, but appears improved since PRBC transfusion.   I have reviewed the natural history of aortic stenosis with the patient and their family members who are present at the bedside this evening. We have discussed the limitations of medical therapy and the poor prognosis associated with symptomatic aortic stenosis. We have reviewed potential treatment options, including palliative medical therapy, conventional surgical aortic valve replacement, and transcatheter aortic valve replacement. We discussed treatment options in the context of this patient's specific comorbid medical conditions. Considering her advanced  age and Stage IV CKD, TAVR seems like a reasonable treatment option. She otherwise is quite functional, still living independently and driving a car. Unlikely to be able to work up her anemia with endoscopy/colonoscopy in the setting of severe aortic stenosis. HgB has stabilized after PRBC transfusion.  We reviewed typical TAVR workup and issues around her advanced kidney disease. She has been followed in [facility], but prefers to proceed with TAVR evaluation and treatment here. Will request cardiac surgical consult and schedule patient for R/L heart cath with limited contrast. I have reviewed the risks, indications, and alternatives to cardiac catheterization, possible angioplasty, and stenting with the patient. Risks include but are not limited to bleeding, infection, vascular injury, stroke, myocardial infection, arrhythmia, kidney injury, radiation-related  injury in the case of prolonged fluoroscopy use, emergency cardiac surgery, and death. The patient understands the risks of serious complication is 1-2 in 9150 with diagnostic cardiac cath and 1-2% or less with angioplasty/stenting.   Anticipate keeping her tomorrow for post-cath hydration and FU of renal function the following day. Likely discharge home Thursday if no further issues arise.   [name] 02/15/2017 5:25 PM

## 2017-02-15 NOTE — Progress Notes (Signed)
Updated report received via Lucy RN in patient's room using SBAR forat, updated on events of the day and new orders. Patient has a unit of PRBC's running with no s/s of distress noted, assumed care of patient.

## 2017-02-15 NOTE — Progress Notes (Signed)
PROGRESS NOTE    Molly Cobb  IBB:[id number] DOB: Feb 02, 1932 DOA: 02/12/2017 PCP: Guadlupe Spanish, MD   Specialists:   Laqueta Linden, MD Nwobu, Melanee Left, MD   Brief Narrative:  83 year old African-American ? HTN-recent increase in toprol as OP History of hyperlipidemia Stage IV kidney disease, RTA  DM TY2 with neuropathy and retinopathy PPM Sjogren's syndrome?, Pseudophakic in both eyes History Medtronic pacemaker Acquired hypothyroidism  Admitted with CP after lifting a couple bottles of water up many flights of stairs lexican and echo done  Cardiology consulted for an opinion  Then noted some dark stool and revealed had been taking aspirin 3-4 tablets of 81 mg couple weeks ago and   Assessment & Plan:   Principal Problem:   Chest pain Active Problems:   Benign hypertension with CKD (chronic kidney disease) stage IV (HCC)   Hypothyroidism (acquired)   Type 2 diabetes mellitus with stage 4 chronic kidney disease, without long-term current use of insulin (HCC)   SSS (sick sinus syndrome) (HCC)   Normocytic anemia    Moderate to severe aortic valve stenosis-Valve area 0.44 cm--? Cause of CP  Probably low risk, Myoview and VQ scan did not show high-risk features  Echocardiogram as below  Possible aortic stenosis causing some presyncopal episodes today 8/21  We will ask Dr. Burt Knack to consider evaluating her for risk versus benefits of TAVR  Symptomatic anemia secondary to microscopic blood loss  Poor perfusion = dyspnea equivalent = took aspirin multiple tablets a couple of weeks ago and had a couple of her stools which  has resolved  Spoke with gastroenterology who recommended follow-up with primary care physicians who have seen her, will hold aspirin on  discharge  Transfuse 2 units PRBC 8/20--had one dark stool but hemoglobin is now up from 10-->11 therefore didn't  think this is GI bleed  Stage IV CK D, metabolic acidosis secondary to  this RTA Nephropathy from, HTN, DM Anemia renal disease  Creatinine 60/2.64 GFR about 18-->>46/2.3  Follow up with [place] nephrologist  Continue bicarbonate 650 4 times a day--bicarbonate is in room from 18-23  Pacemaker placement for sick sinus syndrome Sinus tachycardia--confirmed not Afib on monitors  Details unclear  Paced   We will ask if recurrent tachyarrhythmia for pacemaker interrogation--at this time I think can just watch--restarting low dose toprol as above   Sjogren's syndrome, pseudophakia both eyes  Unclear if actual Sjogren's  Follow up with outpatient ophthalmologist, continue eyedrops  Acquired hypothyroidism DM TY 2  Thyroidectomy in the past, check TSH outpatient  Sugars in the 100s, continue sliding scale coverage   SCD Discussed with family member Inpatient Expected discharge one to 2 days  Consultants:   Cardiology  Procedures:   Myoview stress 8/20  ECHO Impressions:  - Normal LV size with mild LV hypertrophy. EF 55-60%. Normal RV   size and systolic function. Severe aortic stenosis with mild   aortic insufficiency.   Antimicrobials:   None    Subjective:  Alert pleasant oriented Eating drinking No chest pain No weakness unilaterally no visual issues some facial tingling Slight dizzy with positional changes   Objective: Vitals:   02/15/17 0547 02/15/17 1146 02/15/17 1324 02/15/17 1530  BP: (!) 146/53 125/84 (!) 104/92 (!) 134/58  Pulse: 75  74 70  Resp: 18 20 (!) 21 20  Temp: 98.3 F (36.8 C)  98.6 F (37 C) 98.4 F (36.9 C)  TempSrc: Oral  Oral Oral  SpO2: 98%  96% 100%  Weight: 85.4 kg (188 lb 3.2 oz)     Height:        Intake/Output Summary (Last 24 hours) at 02/15/17 1611 Last data filed at 02/15/17 1324  Gross per 24 hour  Intake              935 ml  Output                0 ml  Net              935 ml   Filed Weights   02/13/17 0311 02/14/17 0346 02/15/17 0547  Weight: 87.4 kg (192 lb 11.2 oz) 85.9  kg (189 lb 4.8 oz) 85.4 kg (188 lb 3.2 oz)    Examination:  eomi ncat No pallor no ict Sinus-sinus tach--slightly fast but no othe rissue cta b no added sound abd soft nt nd no rebound LE no swelling Power 5/5 smile symm    Data Reviewed: I have personally reviewed following labs and imaging studies  CBC:  Recent Labs Lab 02/12/17 1928 02/13/17 0404 02/15/17 0439 02/15/17 1352  WBC 9.1 8.1 8.5 8.1  NEUTROABS 6.6  --   --   --   HGB 7.7* 7.9* 10.7* 11.5*  HCT 24.2* 25.3* 33.1* 35.7*  MCV 82.6 81.1 81.1 82.4  PLT 212 223 204 161   Basic Metabolic Panel:  Recent Labs Lab 02/12/17 1928 02/15/17 0439  NA 138 141  K 4.4 4.5  CL 108 111  CO2 18* 23  GLUCOSE 278* 136*  BUN 60* 46*  CREATININE 2.64* 2.39*  CALCIUM 8.0* 8.6*   GFR: Estimated Creatinine Clearance: 18.5 mL/min (A) (by C-G formula based on SCr of 2.39 mg/dL (H)). Liver Function Tests: No results for input(s): AST, ALT, ALKPHOS, BILITOT, PROT, ALBUMIN in the last 168 hours. No results for input(s): LIPASE, AMYLASE in the last 168 hours. No results for input(s): AMMONIA in the last 168 hours. Coagulation Profile: No results for input(s): INR, PROTIME in the last 168 hours. Cardiac Enzymes:  Recent Labs Lab 02/12/17 1928 02/12/17 2345 02/13/17 0404  TROPONINI <0.03 <0.03 <0.03   BNP (last 3 results) No results for input(s): PROBNP in the last 8760 hours. HbA1C: No results for input(s): HGBA1C in the last 72 hours. CBG:  Recent Labs Lab 02/14/17 1119 02/14/17 1615 02/14/17 2120 02/15/17 0747 02/15/17 1111  GLUCAP 155* 209* 143* 125* 151*   Lipid Profile: No results for input(s): CHOL, HDL, LDLCALC, TRIG, CHOLHDL, LDLDIRECT in the last 72 hours. Thyroid Function Tests: No results for input(s): TSH, T4TOTAL, FREET4, T3FREE, THYROIDAB in the last 72 hours. Anemia Panel:  Recent Labs  02/13/17 0404  VITAMINB12 2,095*  FERRITIN 12  TIBC 382  IRON 40  RETICCTPCT 2.6   Urine  analysis:    Component Value Date/Time   COLORURINE YELLOW 05/13/2008 0915   APPEARANCEUR CLEAR 05/13/2008 0915   LABSPEC 1.007 05/13/2008 0915   PHURINE 5.0 05/13/2008 0915   GLUCOSEU NEGATIVE 05/13/2008 0915   HGBUR NEGATIVE 05/13/2008 0915   BILIRUBINUR NEGATIVE 05/13/2008 0915   KETONESUR NEGATIVE 05/13/2008 0915   PROTEINUR NEGATIVE 05/13/2008 0915   UROBILINOGEN 0.2 05/13/2008 0915   NITRITE NEGATIVE 05/13/2008 0915   LEUKOCYTESUR SMALL (A) 05/13/2008 0915     Radiology Studies: Reviewed images personally in health database    Scheduled Meds: . amLODipine  10 mg Oral Daily  . enoxaparin (LOVENOX) injection  30 mg Subcutaneous Q24H  . insulin aspart  0-9 Units Subcutaneous TID WC  .  insulin aspart  3 Units Subcutaneous TID WC  . levothyroxine  100 mcg Oral QAC breakfast  . metoprolol tartrate  12.5 mg Oral BID  . pravastatin  80 mg Oral QHS  . sodium bicarbonate  650 mg Oral QID   Continuous Infusions:   LOS: 1 day    Time spent: [place], MD [facility] ([facility]   If 7PM-7AM, please contact night-coverage [email or web address] Password [email or web address] 02/15/2017, 4:11 PM

## 2017-02-16 ENCOUNTER — Inpatient Hospital Stay (HOSPITAL_COMMUNITY): Payer: Medicare Other

## 2017-02-16 ENCOUNTER — Inpatient Hospital Stay (HOSPITAL_COMMUNITY)
Admission: EM | Disposition: A | Discharge status: Home Health | Payer: Self-pay | Source: Home / Self Care | Attending: Family Medicine

## 2017-02-16 ENCOUNTER — Encounter (HOSPITAL_COMMUNITY): Payer: Self-pay | Admitting: Physician Assistant

## 2017-02-16 ENCOUNTER — Other Ambulatory Visit: Payer: Self-pay | Admitting: *Deleted

## 2017-02-16 DIAGNOSIS — I350 Nonrheumatic aortic (valve) stenosis: Secondary | ICD-10-CM

## 2017-02-16 DIAGNOSIS — K921 Melena: Secondary | ICD-10-CM

## 2017-02-16 DIAGNOSIS — N184 Chronic kidney disease, stage 4 (severe): Secondary | ICD-10-CM

## 2017-02-16 DIAGNOSIS — Z01810 Encounter for preprocedural cardiovascular examination: Secondary | ICD-10-CM

## 2017-02-16 DIAGNOSIS — I10 Essential (primary) hypertension: Secondary | ICD-10-CM

## 2017-02-16 DIAGNOSIS — E039 Hypothyroidism, unspecified: Secondary | ICD-10-CM

## 2017-02-16 DIAGNOSIS — D649 Anemia, unspecified: Secondary | ICD-10-CM

## 2017-02-16 DIAGNOSIS — Z950 Presence of cardiac pacemaker: Secondary | ICD-10-CM

## 2017-02-16 DIAGNOSIS — E1122 Type 2 diabetes mellitus with diabetic chronic kidney disease: Secondary | ICD-10-CM

## 2017-02-16 HISTORY — PX: RIGHT/LEFT HEART CATH AND CORONARY ANGIOGRAPHY: CATH118266

## 2017-02-16 LAB — BASIC METABOLIC PANEL
ANION GAP: 6 (ref 5–15)
BUN: 48 mg/dL — ABNORMAL HIGH (ref 6–20)
CO2: 25 mmol/L (ref 22–32)
Calcium: 9 mg/dL (ref 8.9–10.3)
Chloride: 109 mmol/L (ref 101–111)
Creatinine, Ser: 2.52 mg/dL — ABNORMAL HIGH (ref 0.44–1.00)
GFR, EST AFRICAN AMERICAN: 19 mL/min — AB (ref >60)
GFR, EST NON AFRICAN AMERICAN: 17 mL/min — AB (ref >60)
GLUCOSE: 140 mg/dL — AB (ref 65–99)
POTASSIUM: 5.2 mmol/L — AB (ref 3.5–5.1)
Sodium: 140 mmol/L (ref 135–145)

## 2017-02-16 LAB — POCT I-STAT 3, ART BLOOD GAS (G3+)
Acid-base deficit: 3 mmol/L — ABNORMAL HIGH (ref 0.0–2.0)
BICARBONATE: 21.1 mmol/L (ref 20.0–28.0)
O2 Saturation: 99 %
PCO2 ART: 34 mmHg (ref 32.0–48.0)
PH ART: 7.401 (ref 7.350–7.450)
TCO2: 22 mmol/L (ref 0–100)
pO2, Arterial: 120 mmHg — ABNORMAL HIGH (ref 83.0–108.0)

## 2017-02-16 LAB — CBC
HEMATOCRIT: 34.3 % — AB (ref 36.0–46.0)
Hemoglobin: 11 g/dL — ABNORMAL LOW (ref 12.0–15.0)
MCH: 26.4 pg (ref 26.0–34.0)
MCHC: 32.1 g/dL (ref 30.0–36.0)
MCV: 82.5 fL (ref 78.0–100.0)
Platelets: 203 10*3/uL (ref 150–400)
RBC: 4.16 MIL/uL (ref 3.87–5.11)
RDW: 15 % (ref 11.5–15.5)
WBC: 7.6 10*3/uL (ref 4.0–10.5)

## 2017-02-16 LAB — GLUCOSE, CAPILLARY
GLUCOSE-CAPILLARY: 118 mg/dL — AB (ref 65–99)
GLUCOSE-CAPILLARY: 123 mg/dL — AB (ref 65–99)
Glucose-Capillary: 109 mg/dL — ABNORMAL HIGH (ref 65–99)
Glucose-Capillary: 182 mg/dL — ABNORMAL HIGH (ref 65–99)

## 2017-02-16 LAB — POCT I-STAT 3, VENOUS BLOOD GAS (G3P V)
Acid-base deficit: 3 mmol/L — ABNORMAL HIGH (ref 0.0–2.0)
Bicarbonate: 21.9 mmol/L (ref 20.0–28.0)
O2 SAT: 64 %
PCO2 VEN: 40 mmHg — AB (ref 44.0–60.0)
PH VEN: 7.347 (ref 7.250–7.430)
PO2 VEN: 35 mmHg (ref 32.0–45.0)
TCO2: 23 mmol/L (ref 0–100)

## 2017-02-16 LAB — VAS US CAROTID
LEFT ECA DIAS: -15 cm/s
LEFT VERTEBRAL DIAS: -22 cm/s
LICAPDIAS: -20 cm/s
Left CCA dist dias: -16 cm/s
Left CCA dist sys: -69 cm/s
Left CCA prox dias: 11 cm/s
Left CCA prox sys: 54 cm/s
Left ICA dist dias: -21 cm/s
Left ICA dist sys: -58 cm/s
Left ICA prox sys: -54 cm/s
RCCAPDIAS: 13 cm/s
RIGHT ECA DIAS: -12 cm/s
RIGHT VERTEBRAL DIAS: -13 cm/s
Right CCA prox sys: 65 cm/s
Right cca dist sys: -68 cm/s

## 2017-02-16 LAB — PROTIME-INR
INR: 1.12
PROTHROMBIN TIME: 14.5 s (ref 11.4–15.2)

## 2017-02-16 LAB — OCCULT BLOOD X 1 CARD TO LAB, STOOL: Fecal Occult Bld: POSITIVE — AB

## 2017-02-16 LAB — MAGNESIUM: Magnesium: 1.9 mg/dL (ref 1.7–2.4)

## 2017-02-16 SURGERY — RIGHT/LEFT HEART CATH AND CORONARY ANGIOGRAPHY
Anesthesia: LOCAL

## 2017-02-16 MED ORDER — LIDOCAINE HCL 1 % IJ SOLN
INTRAMUSCULAR | Status: AC
  Filled 2017-02-16: qty 20

## 2017-02-16 MED ORDER — MIDAZOLAM HCL 2 MG/2ML IJ SOLN
INTRAMUSCULAR | Status: DC | PRN
  Administered 2017-02-16: 1 mg via INTRAVENOUS

## 2017-02-16 MED ORDER — ASPIRIN 81 MG PO CHEW
81.0000 mg | CHEWABLE_TABLET | Freq: Once | ORAL | Status: AC
  Administered 2017-02-16: 81 mg via ORAL
  Filled 2017-02-16: qty 1

## 2017-02-16 MED ORDER — SODIUM CHLORIDE 0.9 % IV SOLN: INTRAVENOUS | Status: AC

## 2017-02-16 MED ORDER — SODIUM CHLORIDE 0.9 % IV SOLN
125.0000 mg | Freq: Once | INTRAVENOUS | Status: AC
  Administered 2017-02-16: 125 mg via INTRAVENOUS
  Filled 2017-02-16: qty 10

## 2017-02-16 MED ORDER — HEPARIN SODIUM (PORCINE) 1000 UNIT/ML IJ SOLN
INTRAMUSCULAR | Status: AC
  Filled 2017-02-16: qty 1

## 2017-02-16 MED ORDER — HEPARIN SODIUM (PORCINE) 1000 UNIT/ML IJ SOLN
INTRAMUSCULAR | Status: DC | PRN
  Administered 2017-02-16: 3000 [IU] via INTRAVENOUS

## 2017-02-16 MED ORDER — HEPARIN (PORCINE) IN NACL 2-0.9 UNIT/ML-% IJ SOLN
INTRAMUSCULAR | Status: AC
  Filled 2017-02-16: qty 1000

## 2017-02-16 MED ORDER — FENTANYL CITRATE (PF) 100 MCG/2ML IJ SOLN
INTRAMUSCULAR | Status: DC | PRN
  Administered 2017-02-16: 25 ug via INTRAVENOUS

## 2017-02-16 MED ORDER — SODIUM CHLORIDE 0.9 % IV SOLN: 25.0000 mg | Freq: Once | INTRAVENOUS | Status: DC

## 2017-02-16 MED ORDER — VERAPAMIL HCL 2.5 MG/ML IV SOLN
INTRAVENOUS | Status: AC
  Filled 2017-02-16: qty 2

## 2017-02-16 MED ORDER — SODIUM CHLORIDE 0.9% FLUSH
3.0000 mL | Freq: Two times a day (BID) | INTRAVENOUS | Status: DC
  Administered 2017-02-16 – 2017-02-18 (×4): 3 mL via INTRAVENOUS

## 2017-02-16 MED ORDER — FENTANYL CITRATE (PF) 100 MCG/2ML IJ SOLN
INTRAMUSCULAR | Status: AC
  Filled 2017-02-16: qty 2

## 2017-02-16 MED ORDER — IOPAMIDOL (ISOVUE-370) INJECTION 76%
INTRAVENOUS | Status: DC | PRN
  Administered 2017-02-16: 35 mL via INTRA_ARTERIAL

## 2017-02-16 MED ORDER — HEPARIN (PORCINE) IN NACL 2-0.9 UNIT/ML-% IJ SOLN
INTRAMUSCULAR | Status: AC | PRN
  Administered 2017-02-16: 1000 mL

## 2017-02-16 MED ORDER — VERAPAMIL HCL 2.5 MG/ML IV SOLN
INTRAVENOUS | Status: DC | PRN
  Administered 2017-02-16: 10 mL via INTRA_ARTERIAL

## 2017-02-16 MED ORDER — IOPAMIDOL (ISOVUE-370) INJECTION 76%
INTRAVENOUS | Status: AC
  Filled 2017-02-16: qty 100

## 2017-02-16 MED ORDER — SODIUM CHLORIDE 0.9% FLUSH: 3.0000 mL | INTRAVENOUS | Status: DC | PRN

## 2017-02-16 MED ORDER — LIDOCAINE HCL (PF) 1 % IJ SOLN
INTRAMUSCULAR | Status: DC | PRN
  Administered 2017-02-16: 5 mL

## 2017-02-16 MED ORDER — MIDAZOLAM HCL 2 MG/2ML IJ SOLN
INTRAMUSCULAR | Status: AC
  Filled 2017-02-16: qty 2

## 2017-02-16 MED ORDER — SODIUM CHLORIDE 0.9 % IV SOLN
250.0000 mL | INTRAVENOUS | Status: DC | PRN
  Administered 2017-02-18: 250 mL via INTRAVENOUS

## 2017-02-16 SURGICAL SUPPLY — 13 items
CATH BALLN WEDGE 5F 110CM (CATHETERS) ×2 IMPLANT
CATH OPTITORQUE JACKY 4.0 5F (CATHETERS) ×2 IMPLANT
DEVICE RAD COMP TR BAND LRG (VASCULAR PRODUCTS) ×2 IMPLANT
GLIDESHEATH SLEND SS 6F .021 (SHEATH) ×2 IMPLANT
GUIDEWIRE .025 260CM (WIRE) ×2 IMPLANT
GUIDEWIRE ANGLED .035X150CM (WIRE) IMPLANT
GUIDEWIRE INQWIRE 1.5J.035X260 (WIRE) ×1 IMPLANT
INQWIRE 1.5J .035X260CM (WIRE) ×2
KIT HEART LEFT (KITS) ×2 IMPLANT
PACK CARDIAC CATHETERIZATION (CUSTOM PROCEDURE TRAY) ×2 IMPLANT
SHEATH GLIDE SLENDER 4/5FR (SHEATH) ×2 IMPLANT
TRANSDUCER W/STOPCOCK (MISCELLANEOUS) ×2 IMPLANT
TUBING CIL FLEX 10 FLL-RA (TUBING) ×2 IMPLANT

## 2017-02-16 NOTE — Consult Note (Signed)
Molly Cobb Gastroenterology Consult: 8:13 AM 02/17/2017  LOS: 3 days    Referring Provider: Dr Molly Cobb  Primary Care Physician:  Molly Spanish, MD in [facility] Primary Gastroenterologist:  unassigned    Reason for Consultation:  FOBT + anemia.     HPI: Molly Cobb is a 83 y.o. female.  PMH stage 4 CKD (GFR 16) and AKI.  Type 2 DM.  Nephrolithiasis. SSS, S/p pacemaker.  Liver granulomas per 10/2016 CT.  S/p ORIF right femur fx.  DDD/DJD spine.   Previous unremarkable colonoscopy in [facility], ~ 5 or so years ago.  No previous EGD.     Admitted 02/12/17 with exertional SS chest pain, DOE.  Ruled in for demand ischemia in setting Hgb 7.7, MCV 82. (11/05/2016: Hgb 11.8 MCV 80).  FOBT +.  Severe Ao stenosis by echo.  Low risk nuclear stress test.  EF 45 to 50% (55-60% by echo).  8/22 Cath: 90% prox/midRCA, 70% mid Cx with rec "aortic valve replacement +1 vessel RCA CABG versus TAVR and RCA PCI (requires atherectomy and high risk for contrast-induced nephropathy).  Hgb 7.9 >> PRBC x 2 >> 11.5 >> 9.4.  Not iron deficient but ferritin is borderline at 12.  B12 not low. FOBT + brown stool.   S/p Ferric gluconate IV on 8/22.    3 weeks ago had 2 to 3 days of tarry stools, resolved and stools returned back to brown.  No N/V.  No dysphagia.  No heartburn.  Lately weight up some few pounds but in past years weighed as much as 350#.  No NSAIDs or ASA      Past Medical History:  Diagnosis Date  . CKD (chronic kidney disease)    a. followed by [facility]. Neta Cobb at [facility]   . Depression   . Diabetes mellitus (Molly Cobb)   . Gout   . Hypertension   . Presence of permanent cardiac pacemaker    a. followed by Molly Cobb  . Severe aortic stenosis    a. diagnosed 01/2017 during admission for chest pain.   . Thyroid disease     Past Surgical  History:  Procedure Laterality Date  . FEMUR FRACTURE SURGERY    . RIGHT/LEFT HEART CATH AND CORONARY ANGIOGRAPHY N/A 02/16/2017   Procedure: RIGHT/LEFT HEART CATH AND CORONARY ANGIOGRAPHY;  Surgeon: Molly Hampshire, MD;  Location: [facility];  Service: Cardiovascular;  Laterality: N/A;  . ROTATOR CUFF REPAIR      Prior to Admission medications   Medication Sig Start Date End Date Taking? Authorizing Provider  amLODipine (NORVASC) 10 MG tablet Take 10 mg by mouth daily.   Yes [provider]  levothyroxine (SYNTHROID, LEVOTHROID) 100 MCG tablet Take 100 mcg by mouth daily before breakfast.   Yes [provider]  metoprolol tartrate (LOPRESSOR) 100 MG tablet Take 100 mg by mouth 2 (two) times daily.    Yes [provider]  pravastatin (PRAVACHOL) 80 MG tablet Take 80 mg by mouth at bedtime. 02/08/11  Yes [provider]  sodium bicarbonate 650 MG  tablet Take 650 mg by mouth 2 (two) times daily.    Yes [provider]  clonazePAM (KLONOPIN) 0.5 MG tablet Take 0.5 mg by mouth 2 (two) times daily as needed for anxiety.    [provider]  diclofenac sodium (VOLTAREN) 1 % GEL Apply 4 g topically 4 (four) times daily. Patient not taking: Reported on 02/13/2017 11/30/15   Molly Cobb, April, MD  lidocaine (LIDODERM) 5 % Place 1 patch onto the skin daily. Remove & Discard patch within 12 hours or as directed by MD Patient not taking: Reported on 02/13/2017 11/30/15   Molly Cobb, April, MD  traMADol (ULTRAM) 50 MG tablet Take 1 tablet (50 mg total) by mouth every 6 (six) hours as needed. Patient not taking: Reported on 02/13/2017 10/02/14   Molly Locket, PA-C    Scheduled Meds: . amLODipine  10 mg Oral Daily  . enoxaparin (LOVENOX) injection  30 mg Subcutaneous Q24H  . insulin aspart  0-9 Units Subcutaneous TID WC  . insulin aspart  3 Units Subcutaneous TID WC  . levothyroxine  100 mcg Oral QAC breakfast  . metoprolol tartrate  12.5 mg Oral BID    . pravastatin  80 mg Oral QHS  . sodium bicarbonate  650 mg Oral QID  . sodium chloride flush  3 mL Intravenous Q12H   Infusions: . sodium chloride     PRN Meds: sodium chloride, acetaminophen **OR** acetaminophen, clonazePAM, sodium chloride flush   Allergies as of 02/12/2017 - Review Complete 02/12/2017  Allergen Reaction Noted  . Sulfa antibiotics  10/02/2014    Family History  Problem Relation Age of Onset  . Heart disease Neg Hx   . Stroke Neg Hx   . Diabetes Neg Hx   . Glaucoma Mother  . Macular degeneration Mother  . Diabetes Father  . Hypertension Father  There is no family history of colon polyps, colon cancer, ulcer disease, anemia or need for transfusions.   Social History   Social History  . Marital status: Widowed    Spouse name: N/A  . Number of children: N/A  . Years of education: N/A   Occupational History  . Not on file.   Social History Main Topics  . Smoking status: Never Smoker  . Smokeless tobacco: Never Used  . Alcohol use No  . Drug use: No  . Sexual activity: Not on file   Other Topics Concern  . Not on file   Social History Narrative  . No narrative on file    REVIEW OF SYSTEMS: Constitutional:  Fatigue and weakness have improved over the last few days. ENT:  No nose bleeds Pulm:  Improved dyspnea on exertion. No productive cough. CV:  No palpitations, no LE edema. No chest pain GU:  No hematuria, no frequency GI:  Per HPI Heme:  No unusual bleeding or bruising.   Transfusions:  She thinks she may have had a transfusion decades ago, possibly related to childbirth. Neuro:  No headaches, no peripheral tingling or numbness Derm:  No itching, no rash or sores.  Endocrine:  No sweats or chills.  No polyuria or dysuria Immunization:  Did not inquire as to recent or previous immunizations. Travel:  None beyond local counties in last few months.    PHYSICAL EXAM: Vital signs in last 24 hours: Vitals:   02/17/17 0432 02/17/17  0724  BP: (!) 127/50 (!) 141/48  Pulse: 77 70  Resp: 16 14  Temp: 98.3 F (36.8 C) 98.1 F (36.7 C)  SpO2:  100% 100%   Wt Readings from Last 3 Encounters:  02/17/17 85.8 kg (189 lb 1.6 oz)  11/30/15 76.2 kg (168 lb)  10/02/14 75.8 kg (167 lb)    General: Pleasant, non-ill appearing elderly AAF. Head:  No facial edema or asymmetry. No signs of head trauma.  Eyes:  No scleral icterus Ears:  Not hard of hearing.  Nose:  No congestion or discharge Mouth:  Full dentures in place which were not removed for the exam. Oral mucosa is pink, moist, clear. Tongue midline. Neck:  No masses, thyromegaly, JVD. Lungs:  Clear bilaterally. No cough, no dyspnea. Heart: RRR. Harsh 3/6 systolic murmur. S1, S2 normal. Abdomen:  Nontender, nondistended. Active bowel sounds. No masses, HSM, bruits, hernias.  Pacemaker positioned in the upper left chest.  Rectal: Deferred rectal exam.  Stool specimen submitted to lab 8/22 is FOBT positive Musc/Skeltl: No joint swelling, erythema or significant deformities. Extremities:  No CCE.  Neurologic:  Alert. Oriented times 3. Full limb strength. No tremor. Skin:  No rashes, sores, suspicious lesions. Tattoos:  None Nodes:  No cervical adenopathy   Psych:  Pleasant, cooperative. Calm  Intake/Output from previous day: 08/22 0701 - 08/23 0700 In: 1892.2 [P.O.:360; I.V.:1432.2; IV Piggyback:100] Out: 1800 [VCBSW:[id number]] Intake/Output this shift: No intake/output data recorded.  LAB RESULTS:  Recent Labs  02/15/17 1352 02/16/17 0533 02/17/17 0228  WBC 8.1 7.6 6.5  HGB 11.5* 11.0* 9.4*  HCT 35.7* 34.3* 29.2*  PLT 221 203 163   BMET Lab Results  Component Value Date   NA 139 02/17/2017   NA 140 02/16/2017   NA 141 02/15/2017   K 4.2 02/17/2017   K 5.2 (H) 02/16/2017   K 4.5 02/15/2017   CL 112 (H) 02/17/2017   CL 109 02/16/2017   CL 111 02/15/2017   CO2 21 (L) 02/17/2017   CO2 25 02/16/2017   CO2 23 02/15/2017   GLUCOSE 95 02/17/2017    GLUCOSE 140 (H) 02/16/2017   GLUCOSE 136 (H) 02/15/2017   BUN 45 (H) 02/17/2017   BUN 48 (H) 02/16/2017   BUN 46 (H) 02/15/2017   CREATININE 2.02 (H) 02/17/2017   CREATININE 2.52 (H) 02/16/2017   CREATININE 2.39 (H) 02/15/2017   CALCIUM 7.7 (L) 02/17/2017   CALCIUM 9.0 02/16/2017   CALCIUM 8.6 (L) 02/15/2017   LFT No results for input(s): PROT, ALBUMIN, AST, ALT, ALKPHOS, BILITOT, BILIDIR, IBILI in the last 72 hours. PT/INR Lab Results  Component Value Date   INR 1.12 02/16/2017   INR 1.1 05/13/2008     RADIOLOGY STUDIES: No results found.   IMPRESSION:   *  Anemia, FOBT + stool.  Normocytic.  S/p PRBC x 2 on 8/20.  S/p ferric gluconate infusion 8/22.   With her advanced CKD, anemia not suprising.  ? If GI losses playing minor vs major role in anemia.   *  Severe Ao stenosis with RCA and mid Cx disease.  TAVR + PCI vs surgical AVR and single vsl CABG under consideration.  *  Stage 4 CKD.  Anemia in setting of late stage chronic kidney disease is very common.     PLAN:     *  EGD tomorrow.  D/w pt and she is agreeable.     Azucena Freed  02/17/2017, 8:13 AM Pager: [phone]  ________________________________________________________________________  Velora Heckler GI MD note:  I personally examined the patient, reviewed the data and agree with the assessment and plan described above.  No overt GI bleeding but heme +  mild anemia. With severe AS a colonoscopy my be a bit too taxing on her (agree with Dr. Roxy Cobb note) but EGD should be safe. I am planning for that tomorrow (currently scheduled for 8:30am).  Thanks.   Owens Loffler, MD [facility] Pager [phone]

## 2017-02-16 NOTE — Consult Note (Signed)
[address]       [place] [address]             [id number]        Inpatient TAVR Consultation:   Patient ID: Molly Cobb; [id number]; Aug 31, 1931   Admit date: 02/12/2017 Date of Consult: 02/16/2017  Primary Care Provider: Guadlupe Spanish, MD Primary Cardiologist: Dr. Laqueta Cobb [facility])    Patient Profile:   Molly Cobb is a 83 y.o. female with a hx of  HTN, HLD, hypothyroidism, stage IV CKD, DMT2, s/p PPM, and anemia who is being seen today for the evaluation of severe aortic stenosis at the request of Dr. Burt Cobb.  History of Present Illness:   Ms. Molly Cobb was told she had a heart murmur by her primary medical doctor within the last 10 years but she doesn't recall being told she had a significant valve problem. She has followed with Dr. Lewayne Molly Cobb with cardiology in [facility] for her cardiac pacemaker which was placed over 10 years ago. She is also followed by Dr. Neta Cobb with Nephrology at [facility] for stage IV CKD. She was previously on diabetic medications but was taken off of all anti-hyperglycemics last year given good control. She checks her blood sugar at least 2x a day and they run in the low 100s or below. She was previously diagnosed with OSA and wore a CPAP but reportedly was told she no longer needed after significant weight loss. She has no natural teeth and wears full dentures.   She lives in an apartment in [place] alone. Her husband passed away last year. She has a son and daughter who live in [place], although son is disabled after a stroke. She takes care of all her own cooking and cleaning. The most exertion she gets is pushing the buggy at the supermarket. She has previously been able to push a buggy or walk up stairs with no significant chest pain or dyspnea.   She was in her usual state of health until 02/13/17 when she developed chest pain while walking up 3 flights of stairs, which was unusual for her. The chest pain  radiated to her back and was associated with significant shortness of breath and lightheadedness. It subsided after several minutes but then returned prompting her to seek medical attention at the [facility] ED.   In the ER, her ECG was non acute and troponin was negative. Creat 2.64, H/H 7.9/25.3 (prior Hg in [facility] was 11.8 in 10/2016. Previously in the 9 range in 2017.) Ddimer 0.89, CXR with minimal basilar atelectasis and borderline cardiomegaly, but negative for acute infiltrate or edema. V/Q scan negative for PE.   She was transfused 2 units of PRBCs. Later, FOBT found to be positive. Cardiology was consulted who recommended nuclear stress testing and 2D ECHO given incidental murmur picked up on physical exam. Lexiscan Myoview on 02/14/17 was overall low risk with inferior scar and minimal peri-infarct ischemia and mildly reduced LVEF, 48%. 2D ECHO 02/15/17 showed normal LV function of 55-60%, G1DD, severe AS (mean gradient 76mm Hg, peak gradient 34mm Hg and VTI 0.44 cm^2), mild AI, mild LAE.   She is currently laying in bed and feeling much better with no significant chest pain or SOB since the transfusion. She occasionally gets trace LE edema. No orthopnea or PND. No dizziness or syncope since her pacemaker placement many years ago. No blood in her stool or urine but she did notice black stools  about 3 weeks ago that has since resolved.    Past Medical History:  Diagnosis Date  . Depression   . Diabetes mellitus without complication ([place])   . Gout   . Hypertension   . Presence of permanent cardiac pacemaker   . Thyroid disease     Past Surgical History:  Procedure Laterality Date  . FEMUR FRACTURE SURGERY    . ROTATOR CUFF REPAIR       Inpatient Medications: Scheduled Meds: . amLODipine  10 mg Oral Daily  . enoxaparin (LOVENOX) injection  30 mg Subcutaneous Q24H  . insulin aspart  0-9 Units Subcutaneous TID WC  . insulin aspart  3 Units Subcutaneous TID WC  . levothyroxine  100  mcg Oral QAC breakfast  . metoprolol tartrate  12.5 mg Oral BID  . pravastatin  80 mg Oral QHS  . sodium bicarbonate  650 mg Oral QID  . sodium chloride flush  3 mL Intravenous Q12H   Continuous Infusions: . sodium chloride    . sodium chloride 1 mL/kg/hr (02/16/17 0708)   PRN Meds: sodium chloride, acetaminophen **OR** acetaminophen, clonazePAM, sodium chloride flush  Allergies:    Allergies  Allergen Reactions  . Sulfa Antibiotics Nausea And Vomiting    Social History:   Social History   Social History  . Marital status: Widowed    Spouse name: N/A  . Number of children: N/A  . Years of education: N/A   Occupational History  . Not on file.   Social History Main Topics  . Smoking status: Never Smoker  . Smokeless tobacco: Never Used  . Alcohol use No  . Drug use: No  . Sexual activity: Not on file   Other Topics Concern  . Not on file   Social History Narrative  . No narrative on file    Family History:   The patient's family history is negative for Heart disease, Stroke, and Diabetes.  ROS:   Review of Systems:   General:  Good appetite, good energy, No weight gain, No weight loss, No fever  Cardiac:  + chest pain with exertion, No chest pain at rest, +SOB with exertion, No resting SOB, No PND, No orthopnea, No palpitations, No arrhythmia, No atrial fibrillation, minor, intermittant LE edema, No dizzy spells, No syncope  Respiratory:  No shortness of breath, No home oxygen, No productive cough, No dry cough, No bronchitis, No wheezing, No hemoptysis, No asthma, No pain with inspiration or cough, No current sleep apnea, No CPAP at night  GI:   No difficulty swallowing, No reflux, No frequent heartburn, No hiatal hernia, No abdominal pain, No constipation, No diarrhea, No hematochezia, No hematemesis, No melena  GU:   No dysuria,  No frequency, No urinary tract infection, No hematuria, No enlarged prostate, No kidney stones, No kidney disease  Vascular:  No  pain suggestive of claudication, No pain in feet, No leg cramps, No varicose veins, No DVT, No non-healing foot ulcer  Neuro:   No stroke, No TIA's, No seizures, No headaches, No temporary blindness one eye,  No slurred speech, No peripheral neuropathy, No chronic pain, No instability of gait, No memory/cognitive dysfunction  Musculoskeletal: No arthritis, No joint swelling, No myalgias, No difficulty walking, No mobility   Skin:   No rash, No itching, No skin infections, No pressure sores or ulcerations  Psych:   No anxiety, No depression, No nervousness, No unusual recent stress  Eyes:   No blurry vision, No floaters, No recent vision changes,  No wears glasses or contacts  ENT:   No hearing loss, No loose or painful teeth, + full dentures, last saw dentist unknown  Hematologic:  No easy bruising, No abnormal bleeding, No clotting disorder, No frequent epistaxis  Endocrine:  + diabetes, does check CBG's at home   ROS  All other ROS reviewed and negative.     Physical Exam/Data:   Vitals:   02/15/17 2124 02/16/17 0020 02/16/17 0553 02/16/17 0940  BP: (!) 145/63  133/71 137/62  Pulse: 73 84 62 73  Resp: 16 20 15 19   Temp: 98.6 F (37 C)  98.4 F (36.9 C)   TempSrc: Oral  Oral   SpO2: 100% 100% 100% 100%  Weight:   186 lb 6.4 oz (84.6 kg)   Height:        Intake/Output Summary (Last 24 hours) at 02/16/17 1131 Last data filed at 02/16/17 0651  Gross per 24 hour  Intake            556.2 ml  Output              300 ml  Net            256.2 ml   Filed Weights   02/14/17 0346 02/15/17 0547 02/16/17 0553  Weight: 189 lb 4.8 oz (85.9 kg) 188 lb 3.2 oz (85.4 kg) 186 lb 6.4 oz (84.6 kg)   Body mass index is 33.02 kg/m.  General:  Well nourished, well developed, in no acute distress, obese HEENT: normal Lymph: no adenopathy Neck: no JVD Endocrine:  No thryomegaly Vascular: No carotid bruits; FA pulses 2+ bilaterally without bruits  Cardiac:  normal S1, S2; RRR; 3/6 harsh late  peaking systolic murmur @RUSB  Lungs:  clear to auscultation bilaterally, no wheezing, rhonchi or rales  Abd: soft, nontender, no hepatomegaly  Ext: no edema Musculoskeletal:  No deformities, BUE and BLE strength normal and equal Skin: warm and dry  Neuro:  CNs 2-12 intact, no focal abnormalities noted Psych:  Normal affect   EKG:  The EKG was personally reviewed and demonstrates:  A paced. HR 80 with nonspecific ST/TW changes  Telemetry:  Telemetry was personally reviewed and demonstrates:  A pacing   Relevant CV Studies: 2D ECHO 02/15/17 Study Conclusions - Left ventricle: The cavity size was normal. Wall thickness was   increased in a pattern of mild LVH. Systolic function was normal.   The estimated ejection fraction was in the range of 55% to 60%.   Wall motion was normal; there were no regional wall motion   abnormalities. Doppler parameters are consistent with abnormal   left ventricular relaxation (grade 1 diastolic dysfunction). - Aortic valve: Trileaflet; severely calcified leaflets. There was   severe stenosis. There was mild regurgitation. Mean gradient (S):   61 mm Hg. Peak gradient (S): 93 mm Hg. Valve area (VTI): 0.44   cm^2. - Mitral valve: Moderately calcified annulus. There was no   significant regurgitation. - Left atrium: The atrium was mildly dilated. - Right ventricle: The cavity size was normal. Pacer wire or   catheter noted in right ventricle. Systolic function was normal. - Right atrium: The atrium was mildly dilated. - Tricuspid valve: Peak RV-RA gradient (S): 22 mm Hg. - Pulmonary arteries: PA peak pressure: 25 mm Hg (S). - Inferior vena cava: The vessel was normal in size. The   respirophasic diameter changes were in the normal range (>= 50%),   consistent with normal central venous pressure. Impressions: - Normal LV  size with mild LV hypertrophy. EF 55-60%. Normal RV   size and systolic function. Severe aortic stenosis with mild   aortic  insufficiency.   Lexiscan myoview 02/14/17 Study Result    There was no ST segment deviation noted during stress.  Defect 1: There is a medium defect of moderate severity present in the basal inferior, basal inferolateral, mid inferior, mid inferolateral and apical inferior location.  Findings consistent with prior myocardial infarction.  This is a low risk study.  The left ventricular ejection fraction is mildly decreased (45-54%).  Nuclear stress EF: 48%.   Low risk stress nuclear study with inferior scar with minimal peri-infarct ischemia and mildly reduced left ventricular global systolic function.   L/RHC will be completed today.  Laboratory Data:  Chemistry Recent Labs Lab 02/12/17 1928 02/15/17 0439 02/16/17 0533  NA 138 141 140  K 4.4 4.5 5.2*  CL 108 111 109  CO2 18* 23 25  GLUCOSE 278* 136* 140*  BUN 60* 46* 48*  CREATININE 2.64* 2.39* 2.52*  CALCIUM 8.0* 8.6* 9.0  GFRNONAA 16* 18* 17*  GFRAA 18* 20* 19*  ANIONGAP 12 7 6     No results for input(s): PROT, ALBUMIN, AST, ALT, ALKPHOS, BILITOT in the last 168 hours. Hematology Recent Labs Lab 02/15/17 0439 02/15/17 1352 02/16/17 0533  WBC 8.5 8.1 7.6  RBC 4.08 4.33 4.16  HGB 10.7* 11.5* 11.0*  HCT 33.1* 35.7* 34.3*  MCV 81.1 82.4 82.5  MCH 26.2 26.6 26.4  MCHC 32.3 32.2 32.1  RDW 14.6 15.1 15.0  PLT 204 221 203   Cardiac Enzymes Recent Labs Lab 02/12/17 1928 02/12/17 2345 02/13/17 0404  TROPONINI <0.03 <0.03 <0.03   No results for input(s): TROPIPOC in the last 168 hours.  BNPNo results for input(s): BNP, PROBNP in the last 168 hours.  DDimer  Recent Labs Lab 02/12/17 1928  DDIMER 0.89*    Radiology/Studies:  Dg Chest 2 View  Result Date: 02/12/2017 CLINICAL DATA:  Shortness of breath EXAM: CHEST  2 VIEW COMPARISON:  11/30/2015 FINDINGS: Left-sided multi lead pacing device as before. Streaky bibasilar atelectasis. No acute consolidation or pleural effusion. Borderline cardiomegaly.  Aortic atherosclerosis. No pneumothorax. Degenerative changes of the spine. IMPRESSION: Minimal basilar atelectasis and borderline cardiomegaly. Negative for acute infiltrate or edema. Electronically Signed   By: Donavan Foil M.D.   On: 02/12/2017 19:48   Nm Myocar Multi W/spect W/wall Motion / Ef  Result Date: 02/14/2017  There was no ST segment deviation noted during stress.  Defect 1: There is a medium defect of moderate severity present in the basal inferior, basal inferolateral, mid inferior, mid inferolateral and apical inferior location.  Findings consistent with prior myocardial infarction.  This is a low risk study.  The left ventricular ejection fraction is mildly decreased (45-54%).  Nuclear stress EF: 48%.  Low risk stress nuclear study with inferior scar with minimal peri-infarct ischemia and mildly reduced left ventricular global systolic function.   Nm Pulmonary Perf And Vent  Result Date: 02/13/2017 CLINICAL DATA:  Substernal chest pain radiating to the back. Shortness of breath. EXAM: NUCLEAR MEDICINE VENTILATION - PERFUSION LUNG SCAN TECHNIQUE: Ventilation images were obtained in multiple projections using inhaled aerosol Tc-31m DTPA. Perfusion images were obtained in multiple projections after intravenous injection of Tc-76m MAA. RADIOPHARMACEUTICALS:  32.2 mCi Technetium-51m DTPA aerosol inhalation and 4.24 mCi Technetium-62m MAA IV COMPARISON:  None. FINDINGS: Ventilation: No focal ventilation defect. Perfusion: No wedge shaped peripheral perfusion defects to suggest acute pulmonary embolism. IMPRESSION:  No scintigraphic evidence of pulmonary embolus. Electronically Signed   By: Kathreen Devoid   On: 02/13/2017 10:55     STS Risk Calculator: Procedure: AV Replacement   Risk of Mortality: 7.976%  Morbidity or Mortality: 41.177%  Long Length of Stay: 22.993%  Short Length of Stay: 9.177%  Permanent Stroke: 4.221%  Prolonged Ventilation: 29.694%  DSW Infection: 0.19%   Renal Failure: 22.086%  Reoperation: 12.319%    Assessment and Plan:   Molly Cobb is a 83 y.o. female with symptoms of severe, stage D1 aortic stenosis with NYHA Class II symptoms. We have personally reviewed the patient's echocardiogram which is notable for normal LV systolic function and severe aortic stenosis with peak gradient of 93 mmHg and mean transvalvular gradient of 61 mmHg. The patient's dimensionless index is 0.19 and calculated aortic valve area is 0.44cm.   I have reviewed the natural history of aortic stenosis with the patient. We have discussed the limitations of medical therapy and the poor prognosis associated with symptomatic aortic stenosis. We have reviewed potential treatment options, including palliative medical therapy, conventional surgical aortic valve replacement, and transcatheter aortic valve replacement. We discussed treatment options in the context of this patient's specific comorbid medical conditions.   The patient's predicted risk of mortality with conventional aortic valve replacement is 7.976% primarily based on her renal insufficiency and age. Other significant comorbid conditions include anemia, diabetes mellitus and HTN. She did have significant anemia ( Hg 7.9) on admission which likely exacerbated her symptoms of severe AS. She was transfused 2U PRBCs on 02/14/17 and her Hg has remained stable without further intervention. FOBT was positive.   She is quite independent and fairly active. She lives alone, drives and cooks/cleans for herself. TAVR seems like a reasonable treatment option for this patient. We discussed typical evaluation which will require a gated cardiac CTA and a CTA of the chest/abdomen/pelvis to evaluate both his cardiac anatomy and peripheral vasculature, which can be done as an outpatient to minimize contrast exposure given CKD. She will have her PFTs and R/L heart cath done today. Carotid dopplers have been completed but not read. She will  also have a PT evaluation arranged during her admission.   Will await further studies and second cardiac surgical consultation to determine whether patient has appropriate anatomy to proceed with TAVR.   Signed, Angelena Form, PA-C  02/16/2017 11:31 AM  I have seen and examined the patient, reviewed the patient's history, and personally reviewed the patient's echocardiogram.  I agree with the assessment and plan as outlined above.  The patient presents with a 1 month h/o increasing fatigue, exertional chest pressure and shortness of breath c/w exertional angina and diastolic congestive heart failure, NYHA functional class II.  She had a more prolonged episode of chest pressure and SOB just prior to admission.  Echocardiogram reveals severe aortic stenosis with normal left ventricular systolic function. The aortic valve appears trileaflet with severe calcification, thickening, and restricted leaflet mobility involving all 3 leaflets. Peak velocity across the aortic valve measures as high as 5.1 m/s. The patient also has mild to moderate aortic insufficiency. She will need aortic valve replacement, and diagnostic cardiac catheterization has been planned for later today to rule out the possibility of significant coexisting coronary artery disease.  The patient also reports a h/o peptic ulcer disease in the remote past and recent h/o black tarry stools approximately 2 weeks ago.  Stool is hemoccult positive and the patient feels better since receiving blood.  Under  the circumstances I feel the patient should probably be seen by the gastroenterology team and possibly undergo EGD during this admission if she remains clinically stable from a cardiac standpoint and diagnostic cardiac catheterization does not revealed critical coronary artery disease. Her aortic stenosis is quite severe, and under the circumstances I would probably defer any plans for colonoscopy until after her aortic stenosis has been treated  surgically.   I spent in excess of 60 minutes during the conduct of this hospital encounter and >50% of this time involved direct face-to-face encounter with the patient for counseling and/or coordination of their care.   Rexene Alberts, MD 02/16/2017 3:09 PM

## 2017-02-16 NOTE — Care Management Note (Signed)
Case Management Note  Patient Details  Name: Molly Cobb MRN: [id number] Date of Birth: 1931/08/19  Subjective/Objective:                 Spoke w patient she would like to use [facility] for [facility] services. Referral placed to  [facility] clinical liaison.    Action/Plan:  CM will continue to follow for DC planning.  Expected Discharge Date:                  Expected Discharge Plan:  [place]  In-House Referral:     Discharge planning Services  CM Consult  Post Acute Care Choice:  Home Health Choice offered to:  Patient  DME Arranged:    DME Agency:     HH Arranged:  PT [place]:  [place]  Status of Service:  In process, will continue to follow  If discussed at Long Length of Stay Meetings, dates discussed:    Additional Comments:  Carles Collet, RN 02/16/2017, 3:55 PM

## 2017-02-16 NOTE — Evaluation (Signed)
Physical Therapy Evaluation Patient Details Name: Molly Cobb MRN: [id number] DOB: 05/15/1932 Today's Date: 02/16/2017   History of Present Illness  83 y.o. female Hypertension, dyslipidemia, stage IV chronic kidney disease, diabetes mellitus, pacemaker, anemia, heart murmur whom we were asked to see for chest pain.  Past Medical History:  Diagnosis Date  . Depression   . Diabetes mellitus without complication ([place])   . Gout   . Hypertension   . Presence of permanent cardiac pacemaker   . Thyroid disease     Clinical Impression  Pt admitted with above diagnosis. Pt currently with functional limitations due to the deficits listed below (see PT Problem List). Pt was limited by orthostatic BP's.  Could not elicit any vertigo and tested for BPPV was negative.  Also testing for vestibular hypofunction negative. Will follow acutely to progress ambulation once BP is more stable.  Pt will benefit from skilled PT to increase their independence and safety with mobility to allow discharge to the venue listed below.    Orthostatic BPs  Supine 131/72  Sitting 137/62  Standing 116/63  Standing after 3 min 110/57    Follow Up Recommendations Home health PT;Supervision - Intermittent    Equipment Recommendations  None recommended by PT    Recommendations for Other Services       Precautions / Restrictions Precautions Precautions: Fall Restrictions Weight Bearing Restrictions: No      Mobility  Bed Mobility Overal bed mobility: Independent                Transfers Overall transfer level: Independent                  Ambulation/Gait             General Gait Details: NT due to pt orthostatic vitals  Stairs            Wheelchair Mobility    Modified Rankin (Stroke Patients Only)       Balance Overall balance assessment: Needs assistance Sitting-balance support: No upper extremity supported;Feet supported Sitting balance-Leahy Scale: Normal      Standing balance support: No upper extremity supported;During functional activity Standing balance-Leahy Scale: Fair Standing balance comment: Pt can stand statically for 4 min without Ue support.                             Pertinent Vitals/Pain Pain Assessment: No/denies pain    Home Living Family/patient expects to be discharged to:: Private residence Living Arrangements: Alone Available Help at Discharge: Family;Available PRN/intermittently Type of Home: Independent living facility Home Access: Level entry     Home Layout: One level Home Equipment: Grab bars - toilet;Grab bars - tub/shower;Walker - 2 wheels;Cane - single point      Prior Function Level of Independence: Independent               Hand Dominance        Extremity/Trunk Assessment   Upper Extremity Assessment Upper Extremity Assessment: Defer to OT evaluation    Lower Extremity Assessment Lower Extremity Assessment: Generalized weakness    Cervical / Trunk Assessment Cervical / Trunk Assessment: Normal  Communication   Communication: No difficulties  Cognition Arousal/Alertness: Awake/alert Behavior During Therapy: WFL for tasks assessed/performed Overall Cognitive Status: Within Functional Limits for tasks assessed  General Comments General comments (skin integrity, edema, etc.): Vestibular assessment completed.   Negative for BPPV all canals.  Negative head thrust.  could not induce any vertiginous symptoms.      Exercises     Assessment/Plan    PT Assessment Patient needs continued PT services  PT Problem List Decreased activity tolerance;Decreased balance;Decreased mobility;Decreased knowledge of use of DME;Decreased safety awareness;Decreased knowledge of precautions       PT Treatment Interventions DME instruction;Gait training;Functional mobility training;Therapeutic activities;Therapeutic exercise;Balance  training;Patient/family education    PT Goals (Current goals can be found in the Care Plan section)  Acute Rehab PT Goals Patient Stated Goal: to get better PT Goal Formulation: With patient Time For Goal Achievement: 03/02/17 Potential to Achieve Goals: Good    Frequency Min 3X/week   Barriers to discharge        Co-evaluation               AM-PAC PT "6 Clicks" Daily Activity  Outcome Measure Difficulty turning over in bed (including adjusting bedclothes, sheets and blankets)?: None Difficulty moving from lying on back to sitting on the side of the bed? : None Difficulty sitting down on and standing up from a chair with arms (e.g., wheelchair, bedside commode, etc,.)?: None Help needed moving to and from a bed to chair (including a wheelchair)?: A Little Help needed walking in hospital room?: Total Help needed climbing 3-5 steps with a railing? : Total 6 Click Score: 17    End of Session Equipment Utilized During Treatment: Gait belt Activity Tolerance: Patient limited by fatigue (orthostatic BP) Patient left: in chair;with call bell/phone within reach Nurse Communication: Mobility status (orthostatic BP) PT Visit Diagnosis: Other abnormalities of gait and mobility (R26.89)    Time: 7482-7078 PT Time Calculation (min) (ACUTE ONLY): 31 min   Charges:   PT Evaluation $PT Eval Moderate Complexity: 1 Mod PT Treatments $Therapeutic Activity: 8-22 mins   PT G Codes:        Larkin Alfred,PT Acute Rehabilitation [phone] (pager)   Denice Paradise 02/16/2017, 11:24 AM

## 2017-02-16 NOTE — Plan of Care (Signed)
Problem: Education: Goal: Understanding of CV disease, CV risk reduction, and recovery process will improve Outcome: Progressing Pt understands the purpose of cath procedure, watched the video regarding the procedure and has signed consent. All questions have been addressed and answered at this time.

## 2017-02-16 NOTE — Progress Notes (Signed)
PROGRESS NOTE    Molly Cobb   JOI:[id number]  DOB: Dec 11, 1931  DOA: 02/12/2017 PCP: Guadlupe Spanish, MD   Brief Narrative:  Molly Cobb 83 y.o. female with  severe Ao stenosis, Hypertension, dyslipidemia, stage IV chronic kidney disease, diabetes mellitus, pacemaker, anemia,  Hypothyroid presenting with chest pain.    Subjective: No complaints today.  ROS: no complaints of nausea, vomiting, constipation diarrhea, cough, dyspnea or dysuria. No other complaints.   Assessment & Plan:   Principal Problem:   Chest pain - myoview and VQ low probability - Cath today  Active Problems: Ao Stenosis/Presyncope  - has had presyncopal episodes- seen by Dr Burt Knack who is considering a TAVR and would like the preliminary work up including above cath - Carotid duplex negative for significant stenosis  CKD (chronic kidney disease) stage IV - close outpt f/u, cont Bicarb  Anemia FOB + - Hb 7/7 on admission- 2 U PRBC on  8/20- Hb now 11 - Iron deficiency noted-  - has apparently been taking Asprin this past week- willl need to repeat FOB and Hb in 1-2 wks - will give IV  Iron today - Nulecit    Hypothyroidism (acquired) s/p thyroidectomy  - synthroid    Type 2 diabetes mellitus with stage 4 chronic kidney disease, without long-term current use of insulin (HCC)   SSS   S/P placement of cardiac pacemaker    DVT prophylaxis: Lovenox Code Status: Full code Family Communication:  Disposition Plan: home tomorrow if Cr stable after cath- HHPT ordered Consultants:   cardiology Procedures:  Carotid Duplex (Doppler)  -  Preliminary findings: Findings consistent with a 1- 39 percent stenosis involving the right internal carotid artery and the left internal carotid artery. Limited evaluation of plaque in the internal carotid arteries bilaterally due to deep nature of vessels.  Bilateral vertebral arteries appear patent and antegrade.  2 D ECHO Normal LV size with mild LV hypertrophy. EF  55-60%. Normal RV   size and systolic function. Severe aortic stenosis with mild   aortic insufficiency.  Antimicrobials:  Anti-infectives    None       Objective: Vitals:   02/16/17 0020 02/16/17 0553 02/16/17 0940 02/16/17 1357  BP:  133/71 137/62 (!) 141/58  Pulse: 84 62 73 75  Resp: 20 15 19 17   Temp:  98.4 F (36.9 C)  98.1 F (36.7 C)  TempSrc:  Oral  Oral  SpO2: 100% 100% 100% 100%  Weight:  84.6 kg (186 lb 6.4 oz)    Height:        Intake/Output Summary (Last 24 hours) at 02/16/17 1420 Last data filed at 02/16/17 0651  Gross per 24 hour  Intake            436.2 ml  Output              300 ml  Net            136.2 ml   Filed Weights   02/14/17 0346 02/15/17 0547 02/16/17 0553  Weight: 85.9 kg (189 lb 4.8 oz) 85.4 kg (188 lb 3.2 oz) 84.6 kg (186 lb 6.4 oz)    Examination: General exam: Appears comfortable  HEENT: PERRLA, oral mucosa moist, no sclera icterus or thrush Respiratory system: Clear to auscultation. Respiratory effort normal. Cardiovascular system: S1 & S2 heard, RRR.  No murmurs  Gastrointestinal system: Abdomen soft, non-tender, nondistended. Normal bowel sound. No organomegaly Central nervous system: Alert and oriented. No focal neurological deficits. Extremities: No  cyanosis, clubbing or edema Skin: No rashes or ulcers Psychiatry:  Mood & affect appropriate.     Data Reviewed: I have personally reviewed following labs and imaging studies  CBC:  Recent Labs Lab 02/12/17 1928 02/13/17 0404 02/15/17 0439 02/15/17 1352 02/16/17 0533  WBC 9.1 8.1 8.5 8.1 7.6  NEUTROABS 6.6  --   --   --   --   HGB 7.7* 7.9* 10.7* 11.5* 11.0*  HCT 24.2* 25.3* 33.1* 35.7* 34.3*  MCV 82.6 81.1 81.1 82.4 82.5  PLT 212 223 204 221 867   Basic Metabolic Panel:  Recent Labs Lab 02/12/17 1928 02/15/17 0439 02/16/17 0533  NA 138 141 140  K 4.4 4.5 5.2*  CL 108 111 109  CO2 18* 23 25  GLUCOSE 278* 136* 140*  BUN 60* 46* 48*  CREATININE 2.64* 2.39*  2.52*  CALCIUM 8.0* 8.6* 9.0  MG  --   --  1.9   GFR: Estimated Creatinine Clearance: 17.4 mL/min (A) (by C-G formula based on SCr of 2.52 mg/dL (H)). Liver Function Tests: No results for input(s): AST, ALT, ALKPHOS, BILITOT, PROT, ALBUMIN in the last 168 hours. No results for input(s): LIPASE, AMYLASE in the last 168 hours. No results for input(s): AMMONIA in the last 168 hours. Coagulation Profile:  Recent Labs Lab 02/16/17 0533  INR 1.12   Cardiac Enzymes:  Recent Labs Lab 02/12/17 1928 02/12/17 2345 02/13/17 0404  TROPONINI <0.03 <0.03 <0.03   BNP (last 3 results) No results for input(s): PROBNP in the last 8760 hours. HbA1C: No results for input(s): HGBA1C in the last 72 hours. CBG:  Recent Labs Lab 02/15/17 1111 02/15/17 1604 02/15/17 2123 02/16/17 0742 02/16/17 1342  GLUCAP 151* 108* 124* 109* 118*   Lipid Profile: No results for input(s): CHOL, HDL, LDLCALC, TRIG, CHOLHDL, LDLDIRECT in the last 72 hours. Thyroid Function Tests: No results for input(s): TSH, T4TOTAL, FREET4, T3FREE, THYROIDAB in the last 72 hours. Anemia Panel: No results for input(s): VITAMINB12, FOLATE, FERRITIN, TIBC, IRON, RETICCTPCT in the last 72 hours. Urine analysis:    Component Value Date/Time   COLORURINE YELLOW 05/13/2008 0915   APPEARANCEUR CLEAR 05/13/2008 0915   LABSPEC 1.007 05/13/2008 0915   PHURINE 5.0 05/13/2008 0915   GLUCOSEU NEGATIVE 05/13/2008 0915   HGBUR NEGATIVE 05/13/2008 0915   BILIRUBINUR NEGATIVE 05/13/2008 0915   KETONESUR NEGATIVE 05/13/2008 0915   PROTEINUR NEGATIVE 05/13/2008 0915   UROBILINOGEN 0.2 05/13/2008 0915   NITRITE NEGATIVE 05/13/2008 0915   LEUKOCYTESUR SMALL (A) 05/13/2008 0915   Sepsis Labs: @LABRCNTIP (procalcitonin:4,lacticidven:4) )No results found for this or any previous visit (from the past 240 hour(s)).       Radiology Studies: No results found.    Scheduled Meds: . amLODipine  10 mg Oral Daily  . enoxaparin  (LOVENOX) injection  30 mg Subcutaneous Q24H  . insulin aspart  0-9 Units Subcutaneous TID WC  . insulin aspart  3 Units Subcutaneous TID WC  . levothyroxine  100 mcg Oral QAC breakfast  . metoprolol tartrate  12.5 mg Oral BID  . pravastatin  80 mg Oral QHS  . sodium bicarbonate  650 mg Oral QID  . sodium chloride flush  3 mL Intravenous Q12H   Continuous Infusions: . sodium chloride    . sodium chloride 1 mL/kg/hr (02/16/17 0708)     LOS: 2 days    Time spent in minutes: 35    Debbe Odea, MD [facility] Pager: [email or web address] Password [email or web address] 02/16/2017, 2:20 PM

## 2017-02-16 NOTE — H&P (View-Only) (Signed)
Progress Note  Patient Name: Molly Cobb Date of Encounter: 02/16/2017  Primary Cardiologist: Dr Lewayne Bunting  Subjective   Patient doing fine this morning. Reports no chest pain or shortness of breath overnight. Pt had normal formed bowel movement with no blood seen, FOBT sent to lab this am.    Inpatient Medications    Scheduled Meds: . amLODipine  10 mg Oral Daily  . enoxaparin (LOVENOX) injection  30 mg Subcutaneous Q24H  . insulin aspart  0-9 Units Subcutaneous TID WC  . insulin aspart  3 Units Subcutaneous TID WC  . levothyroxine  100 mcg Oral QAC breakfast  . metoprolol tartrate  12.5 mg Oral BID  . pravastatin  80 mg Oral QHS  . sodium bicarbonate  650 mg Oral QID  . sodium chloride flush  3 mL Intravenous Q12H   Continuous Infusions: . sodium chloride    . sodium chloride 1 mL/kg/hr (02/16/17 0708)   PRN Meds: sodium chloride, acetaminophen **OR** acetaminophen, clonazePAM, sodium chloride flush   Vital Signs    Vitals:   02/15/17 2124 02/16/17 0020 02/16/17 0553 02/16/17 0940  BP: (!) 145/63  133/71 137/62  Pulse: 73 84 62 73  Resp: 16 20 15 19   Temp: 98.6 F (37 C)  98.4 F (36.9 C)   TempSrc: Oral  Oral   SpO2: 100% 100% 100% 100%  Weight:   84.6 kg (186 lb 6.4 oz)   Height:        Intake/Output Summary (Last 24 hours) at 02/16/17 1152 Last data filed at 02/16/17 0651  Gross per 24 hour  Intake            556.2 ml  Output              300 ml  Net            256.2 ml   Filed Weights   02/14/17 0346 02/15/17 0547 02/16/17 0553  Weight: 85.9 kg (189 lb 4.8 oz) 85.4 kg (188 lb 3.2 oz) 84.6 kg (186 lb 6.4 oz)    Telemetry    Atrial pacing - Personally Reviewed   Physical Exam  Pleasant elderly woman GEN: No acute distress.   Neck: No JVD Cardiac: RRR, 3/6 harsh late peaking systolic murmur at the right upper sternal border Respiratory: Clear to auscultation bilaterally. GI: Soft, nontender, non-distended  MS: No edema; No deformity. Neuro:   Nonfocal  Psych: Normal affect   Labs    Chemistry  Recent Labs Lab 02/12/17 1928 02/15/17 0439 02/16/17 0533  NA 138 141 140  K 4.4 4.5 5.2*  CL 108 111 109  CO2 18* 23 25  GLUCOSE 278* 136* 140*  BUN 60* 46* 48*  CREATININE 2.64* 2.39* 2.52*  CALCIUM 8.0* 8.6* 9.0  GFRNONAA 16* 18* 17*  GFRAA 18* 20* 19*  ANIONGAP 12 7 6      Hematology  Recent Labs Lab 02/15/17 0439 02/15/17 1352 02/16/17 0533  WBC 8.5 8.1 7.6  RBC 4.08 4.33 4.16  HGB 10.7* 11.5* 11.0*  HCT 33.1* 35.7* 34.3*  MCV 81.1 82.4 82.5  MCH 26.2 26.6 26.4  MCHC 32.3 32.2 32.1  RDW 14.6 15.1 15.0  PLT 204 221 203    Cardiac Enzymes  Recent Labs Lab 02/12/17 1928 02/12/17 2345 02/13/17 0404  TROPONINI <0.03 <0.03 <0.03   No results for input(s): TROPIPOC in the last 168 hours.   BNPNo results for input(s): BNP, PROBNP in the last 168 hours.   DDimer  Recent Labs Lab 02/12/17 1928  DDIMER 0.89*     Radiology    No results found.  Cardiac Studies   Myoview stress test 02/14/2017:  There was no ST segment deviation noted during stress.  Defect 1: There is a medium defect of moderate severity present in the basal inferior, basal inferolateral, mid inferior, mid inferolateral and apical inferior location.  Findings consistent with prior myocardial infarction.  This is a low risk study.  The left ventricular ejection fraction is mildly decreased (45-54%).  Nuclear stress EF: 48%.   Low risk stress nuclear study with inferior scar with minimal peri-infarct ischemia and mildly reduced left ventricular global systolic function.    Patient Profile     83 y.o. female Hypertension, dyslipidemia, stage IV chronic kidney disease, diabetes mellitus, pacemaker, anemia, heart murmur whom we were asked to see for chest pain.   Assessment & Plan    1. Chest pain: V/Q scan negative, As per yesterday's note, the patient has negative troponins and may have had some degree of demand  ischemia in the context of severe anemia. Her stress test is low risk. Considering her advanced age and stage IV chronic kidney disease medical therapy is most appropriate.  2. Aortic Stenosis severe: mean transaortic gradient of 60mmHg.  Not very symptomatic until recently in the context of recent anemia.  -Dr. Burt Knack discussed with family risks and benefits of the different treatments available for this level of aortic stenosis.  Pt and family agree that the best option would be TAVR in her case.   -inpatient workup for TAVR: R/L heart caths with limited contrast -cardiac surgery consulted   3. Anemia: Likely GI cause, FOBT positive, given pts severe aortic stenosis and CKD Heyde's syndrome is a possibility.  The patient has undergone blood transfusion with appropriate response.   -Management per the primary service.   4. Stage IV chronic kidney disease: baseline cr 2.3, small creatinine elevation this am but still below admission value. Lab Results  Component Value Date   CREATININE 2.52 (H) 02/16/2017   CREATININE 2.39 (H) 02/15/2017   CREATININE 2.64 (H) 02/12/2017   -Pre cath IV fluids ordered  Disposition: Once work up for TAVR complete, pt is stable for discharge from a cardiac standpoint.  Signed, Vickki Muff, MD  02/16/2017, 11:52 AM    Patient seen, examined. Available data reviewed. Agree with findings, assessment, and plan as outlined by Vickki Muff, MD. My exam today: Vitals:   02/16/17 0940 02/16/17 1357  BP: 137/62 (!) 141/58  Pulse: 73 75  Resp: 19 17  Temp:  98.1 F (36.7 C)  SpO2: 100% 100%   Pt is alert and oriented, NAD HEENT: normal Neck: JVP - normal Lungs: CTA bilaterally CV: RRR with 3/6 late peaking harsh systolic murmur at the RUSB Abd: soft, NT, Positive BS, no hepatomegaly Ext: no C/C/E, distal pulses intact and equal Skin: warm/dry no rash  The patient is awaiting right and left heart catheterization. She has been seen by Dr. Roxy Manns  for surgical evaluation. The patient presented with anemia and has been found to have heme positive stools. We will ask for a formal GI evaluation considering her need for aortic valve replacement. Further plans pending her cardiac catheterization result.  [name], M.D. 02/16/2017 4:25 PM

## 2017-02-16 NOTE — Progress Notes (Signed)
Progress Note  Patient Name: Molly Cobb Date of Encounter: 02/16/2017  Primary Cardiologist: Dr Lewayne Bunting  Subjective   Patient doing fine this morning. Reports no chest pain or shortness of breath overnight. Pt had normal formed bowel movement with no blood seen, FOBT sent to lab this am.    Inpatient Medications    Scheduled Meds: . amLODipine  10 mg Oral Daily  . enoxaparin (LOVENOX) injection  30 mg Subcutaneous Q24H  . insulin aspart  0-9 Units Subcutaneous TID WC  . insulin aspart  3 Units Subcutaneous TID WC  . levothyroxine  100 mcg Oral QAC breakfast  . metoprolol tartrate  12.5 mg Oral BID  . pravastatin  80 mg Oral QHS  . sodium bicarbonate  650 mg Oral QID  . sodium chloride flush  3 mL Intravenous Q12H   Continuous Infusions: . sodium chloride    . sodium chloride 1 mL/kg/hr (02/16/17 0708)   PRN Meds: sodium chloride, acetaminophen **OR** acetaminophen, clonazePAM, sodium chloride flush   Vital Signs    Vitals:   02/15/17 2124 02/16/17 0020 02/16/17 0553 02/16/17 0940  BP: (!) 145/63  133/71 137/62  Pulse: 73 84 62 73  Resp: 16 20 15 19   Temp: 98.6 F (37 C)  98.4 F (36.9 C)   TempSrc: Oral  Oral   SpO2: 100% 100% 100% 100%  Weight:   84.6 kg (186 lb 6.4 oz)   Height:        Intake/Output Summary (Last 24 hours) at 02/16/17 1152 Last data filed at 02/16/17 0651  Gross per 24 hour  Intake            556.2 ml  Output              300 ml  Net            256.2 ml   Filed Weights   02/14/17 0346 02/15/17 0547 02/16/17 0553  Weight: 85.9 kg (189 lb 4.8 oz) 85.4 kg (188 lb 3.2 oz) 84.6 kg (186 lb 6.4 oz)    Telemetry    Atrial pacing - Personally Reviewed   Physical Exam  Pleasant elderly woman GEN: No acute distress.   Neck: No JVD Cardiac: RRR, 3/6 harsh late peaking systolic murmur at the right upper sternal border Respiratory: Clear to auscultation bilaterally. GI: Soft, nontender, non-distended  MS: No edema; No deformity. Neuro:   Nonfocal  Psych: Normal affect   Labs    Chemistry  Recent Labs Lab 02/12/17 1928 02/15/17 0439 02/16/17 0533  NA 138 141 140  K 4.4 4.5 5.2*  CL 108 111 109  CO2 18* 23 25  GLUCOSE 278* 136* 140*  BUN 60* 46* 48*  CREATININE 2.64* 2.39* 2.52*  CALCIUM 8.0* 8.6* 9.0  GFRNONAA 16* 18* 17*  GFRAA 18* 20* 19*  ANIONGAP 12 7 6      Hematology  Recent Labs Lab 02/15/17 0439 02/15/17 1352 02/16/17 0533  WBC 8.5 8.1 7.6  RBC 4.08 4.33 4.16  HGB 10.7* 11.5* 11.0*  HCT 33.1* 35.7* 34.3*  MCV 81.1 82.4 82.5  MCH 26.2 26.6 26.4  MCHC 32.3 32.2 32.1  RDW 14.6 15.1 15.0  PLT 204 221 203    Cardiac Enzymes  Recent Labs Lab 02/12/17 1928 02/12/17 2345 02/13/17 0404  TROPONINI <0.03 <0.03 <0.03   No results for input(s): TROPIPOC in the last 168 hours.   BNPNo results for input(s): BNP, PROBNP in the last 168 hours.   DDimer  Recent Labs Lab 02/12/17 1928  DDIMER 0.89*     Radiology    No results found.  Cardiac Studies   Myoview stress test 02/14/2017:  There was no ST segment deviation noted during stress.  Defect 1: There is a medium defect of moderate severity present in the basal inferior, basal inferolateral, mid inferior, mid inferolateral and apical inferior location.  Findings consistent with prior myocardial infarction.  This is a low risk study.  The left ventricular ejection fraction is mildly decreased (45-54%).  Nuclear stress EF: 48%.   Low risk stress nuclear study with inferior scar with minimal peri-infarct ischemia and mildly reduced left ventricular global systolic function.    Patient Profile     83 y.o. female Hypertension, dyslipidemia, stage IV chronic kidney disease, diabetes mellitus, pacemaker, anemia, heart murmur whom we were asked to see for chest pain.   Assessment & Plan    1. Chest pain: V/Q scan negative, As per yesterday's note, the patient has negative troponins and may have had some degree of demand  ischemia in the context of severe anemia. Her stress test is low risk. Considering her advanced age and stage IV chronic kidney disease medical therapy is most appropriate.  2. Aortic Stenosis severe: mean transaortic gradient of 18mmHg.  Not very symptomatic until recently in the context of recent anemia.  -Dr. Burt Knack discussed with family risks and benefits of the different treatments available for this level of aortic stenosis.  Pt and family agree that the best option would be TAVR in her case.   -inpatient workup for TAVR: R/L heart caths with limited contrast -cardiac surgery consulted   3. Anemia: Likely GI cause, FOBT positive, given pts severe aortic stenosis and CKD Heyde's syndrome is a possibility.  The patient has undergone blood transfusion with appropriate response.   -Management per the primary service.   4. Stage IV chronic kidney disease: baseline cr 2.3, small creatinine elevation this am but still below admission value. Lab Results  Component Value Date   CREATININE 2.52 (H) 02/16/2017   CREATININE 2.39 (H) 02/15/2017   CREATININE 2.64 (H) 02/12/2017   -Pre cath IV fluids ordered  Disposition: Once work up for TAVR complete, pt is stable for discharge from a cardiac standpoint.  Signed, Vickki Muff, MD  02/16/2017, 11:52 AM    Patient seen, examined. Available data reviewed. Agree with findings, assessment, and plan as outlined by Vickki Muff, MD. My exam today: Vitals:   02/16/17 0940 02/16/17 1357  BP: 137/62 (!) 141/58  Pulse: 73 75  Resp: 19 17  Temp:  98.1 F (36.7 C)  SpO2: 100% 100%   Pt is alert and oriented, NAD HEENT: normal Neck: JVP - normal Lungs: CTA bilaterally CV: RRR with 3/6 late peaking harsh systolic murmur at the RUSB Abd: soft, NT, Positive BS, no hepatomegaly Ext: no C/C/E, distal pulses intact and equal Skin: warm/dry no rash  The patient is awaiting right and left heart catheterization. She has been seen by Dr. Roxy Manns  for surgical evaluation. The patient presented with anemia and has been found to have heme positive stools. We will ask for a formal GI evaluation considering her need for aortic valve replacement. Further plans pending her cardiac catheterization result.  [name], M.D. 02/16/2017 4:25 PM

## 2017-02-16 NOTE — Interval H&P Note (Signed)
History and Physical Interval Note:  02/16/2017 4:42 PM  Molly Cobb  has presented today for surgery, with the diagnosis of aortic stenosis  The various methods of treatment have been discussed with the patient and family. After consideration of risks, benefits and other options for treatment, the patient has consented to  Procedure(s): RIGHT/LEFT HEART CATH AND CORONARY ANGIOGRAPHY (N/A) as a surgical intervention .  The patient's history has been reviewed, patient examined, no change in status, stable for surgery.  I have reviewed the patient's chart and labs.  Questions were answered to the patient's satisfaction.     Kathlyn Sacramento

## 2017-02-16 NOTE — Progress Notes (Addendum)
*  PRELIMINARY RESULTS* Vascular Ultrasound Carotid Duplex (Doppler) has been completed.  Preliminary findings: Findings consistent with a 1- 71 percent stenosis involving the right internal carotid artery and the left internal carotid artery. Limited evaluation of plaque in the internal carotid arteries bilaterally due to deep nature of vessels.  Bilateral vertebral arteries appear patent and antegrade.  Everrett Coombe 02/16/2017, 11:37 AM

## 2017-02-17 ENCOUNTER — Encounter (HOSPITAL_COMMUNITY): Payer: Self-pay | Admitting: Cardiovascular Disease

## 2017-02-17 ENCOUNTER — Inpatient Hospital Stay (HOSPITAL_COMMUNITY): Payer: Medicare Other

## 2017-02-17 LAB — PULMONARY FUNCTION TEST
DL/VA % PRED: 65 %
DL/VA: 3.09 ml/min/mmHg/L
DLCO COR % PRED: 51 %
DLCO UNC % PRED: 44 %
DLCO UNC: 10.11 ml/min/mmHg
DLCO cor: 11.88 ml/min/mmHg
FEF 25-75 POST: 1.83 L/s
FEF 25-75 PRE: 1.89 L/s
FEF2575-%CHANGE-POST: -3 %
FEF2575-%PRED-POST: 164 %
FEF2575-%Pred-Pre: 170 %
FEV1-%CHANGE-POST: 0 %
FEV1-%PRED-POST: 152 %
FEV1-%Pred-Pre: 151 %
FEV1-Post: 2.09 L
FEV1-Pre: 2.08 L
FEV1FVC-%CHANGE-POST: 3 %
FEV1FVC-%PRED-PRE: 103 %
FEV6-%CHANGE-POST: 0 %
FEV6-%PRED-POST: 153 %
FEV6-%Pred-Pre: 154 %
FEV6-PRE: 2.6 L
FEV6-Post: 2.59 L
FEV6FVC-%CHANGE-POST: 2 %
FEV6FVC-%PRED-PRE: 102 %
FEV6FVC-%Pred-Post: 105 %
FVC-%Change-Post: -2 %
FVC-%Pred-Post: 145 %
FVC-%Pred-Pre: 149 %
FVC-Post: 2.59 L
FVC-Pre: 2.66 L
POST FEV1/FVC RATIO: 81 %
PRE FEV6/FVC RATIO: 98 %
Post FEV6/FVC ratio: 100 %
Pre FEV1/FVC ratio: 78 %
RV % PRED: 113 %
RV: 2.75 L
TLC % pred: 113 %
TLC: 5.55 L

## 2017-02-17 LAB — GLUCOSE, CAPILLARY
GLUCOSE-CAPILLARY: 107 mg/dL — AB (ref 65–99)
GLUCOSE-CAPILLARY: 215 mg/dL — AB (ref 65–99)
Glucose-Capillary: 130 mg/dL — ABNORMAL HIGH (ref 65–99)

## 2017-02-17 LAB — CBC
HCT: 29.2 % — ABNORMAL LOW (ref 36.0–46.0)
HEMOGLOBIN: 9.4 g/dL — AB (ref 12.0–15.0)
MCH: 26.9 pg (ref 26.0–34.0)
MCHC: 32.2 g/dL (ref 30.0–36.0)
MCV: 83.7 fL (ref 78.0–100.0)
PLATELETS: 163 10*3/uL (ref 150–400)
RBC: 3.49 MIL/uL — AB (ref 3.87–5.11)
RDW: 15 % (ref 11.5–15.5)
WBC: 6.5 10*3/uL (ref 4.0–10.5)

## 2017-02-17 LAB — BASIC METABOLIC PANEL
Anion gap: 6 (ref 5–15)
BUN: 45 mg/dL — AB (ref 6–20)
CO2: 21 mmol/L — ABNORMAL LOW (ref 22–32)
CREATININE: 2.02 mg/dL — AB (ref 0.44–1.00)
Calcium: 7.7 mg/dL — ABNORMAL LOW (ref 8.9–10.3)
Chloride: 112 mmol/L — ABNORMAL HIGH (ref 101–111)
GFR, EST AFRICAN AMERICAN: 25 mL/min — AB (ref >60)
GFR, EST NON AFRICAN AMERICAN: 22 mL/min — AB (ref >60)
Glucose, Bld: 95 mg/dL (ref 65–99)
Potassium: 4.2 mmol/L (ref 3.5–5.1)
SODIUM: 139 mmol/L (ref 135–145)

## 2017-02-17 MED ORDER — ALBUTEROL SULFATE (2.5 MG/3ML) 0.083% IN NEBU
2.5000 mg | INHALATION_SOLUTION | Freq: Once | RESPIRATORY_TRACT | Status: AC
  Administered 2017-02-17: 2.5 mg via RESPIRATORY_TRACT

## 2017-02-17 MED FILL — Lidocaine HCl Local Inj 1%: INTRAMUSCULAR | Qty: 20 | Status: AC

## 2017-02-17 NOTE — Progress Notes (Signed)
Physical Therapy Treatment Patient Details Name: Molly Cobb MRN: [id number] DOB: 06-01-1932 Today's Date: 02/17/2017    History of Present Illness 83 y.o. female presenting with chest pain. Pt with severe aortic stenosis and workup for TAVR in progress. PMH - Hypertension, dyslipidemia, stage IV chronic kidney disease, diabetes mellitus, pacemaker, anemia, heart murmur.    PT Comments    Pt with good progress with mobility. Pre-TAVR assessment performed and documented below.   6 Minute Walk Test  Distance - 635 ft      Pre    Post  BP     126/51   119/66  HR    73    111  SaO2    100    97  Modified Borg Dyspnea 0    1  RPE    0    13    5 Meter Walk Test  Trial   1) 5.9 sec   2) 5.37 sec  3) 5.79 sec  Average - 5.69 sec = 2.88 ft/sec   Clinical Frailty Scale - 4             Follow Up Recommendations  Supervision - Intermittent;No PT follow up     Equipment Recommendations  None recommended by PT    Recommendations for Other Services       Precautions / Restrictions Precautions Precautions: Fall Restrictions Weight Bearing Restrictions: No    Mobility  Bed Mobility Overal bed mobility: Independent                Transfers Overall transfer level: Independent                  Ambulation/Gait Ambulation/Gait assistance: Min guard Ambulation Distance (Feet): 635 Feet Assistive device: None Gait Pattern/deviations: Step-through pattern;Drifts right/left Gait velocity: 2.88 ft/sec Gait velocity interpretation: at or above normal speed for age/gender General Gait Details: Pt initially with unsteady gait that improved with incr distance.    Stairs            Wheelchair Mobility    Modified Rankin (Stroke Patients Only)       Balance Overall balance assessment: Needs assistance Sitting-balance support: No upper extremity supported;Feet supported Sitting balance-Leahy Scale: Normal     Standing balance  support: No upper extremity supported;During functional activity Standing balance-Leahy Scale: Fair Standing balance comment: Static standing independently, Dynamic with min guard                            Cognition Arousal/Alertness: Awake/alert Behavior During Therapy: WFL for tasks assessed/performed Overall Cognitive Status: Within Functional Limits for tasks assessed                                        Exercises      General Comments        Pertinent Vitals/Pain      Home Living                      Prior Function            PT Goals (current goals can now be found in the care plan section) Acute Rehab PT Goals Patient Stated Goal: to get better Progress towards PT goals: Progressing toward goals    Frequency    Min 3X/week      PT Plan Discharge plan  needs to be updated    Co-evaluation              AM-PAC PT "6 Clicks" Daily Activity  Outcome Measure  Difficulty turning over in bed (including adjusting bedclothes, sheets and blankets)?: None Difficulty moving from lying on back to sitting on the side of the bed? : None Difficulty sitting down on and standing up from a chair with arms (e.g., wheelchair, bedside commode, etc,.)?: None Help needed moving to and from a bed to chair (including a wheelchair)?: None Help needed walking in hospital room?: A Little Help needed climbing 3-5 steps with a railing? : A Little 6 Click Score: 22    End of Session   Activity Tolerance: Patient tolerated treatment well Patient left: with call bell/phone within reach;in bed;with family/visitor present Nurse Communication: Mobility status PT Visit Diagnosis: Unsteadiness on feet (R26.81)     Time: 1459-1540 PT Time Calculation (min) (ACUTE ONLY): 41 min  Charges:  $Gait Training: 23-37 mins $Physical Performance Test: 8-22 mins                    G Codes:       [facility] PT [place] 02/17/2017, 3:58 PM [employer] PT [phone]

## 2017-02-17 NOTE — Progress Notes (Signed)
Progress Note  Patient Name: Molly Cobb Date of Encounter: 02/17/2017  Primary Cardiologist: Lewayne Bunting  Subjective   Pt feeling well this morning. No CP, dyspnea, or lightheadedness.  Inpatient Medications    Scheduled Meds: . amLODipine  10 mg Oral Daily  . enoxaparin (LOVENOX) injection  30 mg Subcutaneous Q24H  . insulin aspart  0-9 Units Subcutaneous TID WC  . insulin aspart  3 Units Subcutaneous TID WC  . levothyroxine  100 mcg Oral QAC breakfast  . metoprolol tartrate  12.5 mg Oral BID  . pravastatin  80 mg Oral QHS  . sodium bicarbonate  650 mg Oral QID  . sodium chloride flush  3 mL Intravenous Q12H   Continuous Infusions: . sodium chloride     PRN Meds: sodium chloride, acetaminophen **OR** acetaminophen, clonazePAM, sodium chloride flush   Vital Signs    Vitals:   02/17/17 0425 02/17/17 0432 02/17/17 0724 02/17/17 0929  BP:  (!) 127/50 (!) 141/48 131/87  Pulse:  77 70 71  Resp:  16 14   Temp:  98.3 F (36.8 C) 98.1 F (36.7 C)   TempSrc:  Oral Oral   SpO2:  100% 100%   Weight: 189 lb 1.6 oz (85.8 kg)     Height:        Intake/Output Summary (Last 24 hours) at 02/17/17 1120 Last data filed at 02/17/17 1057  Gross per 24 hour  Intake          2132.21 ml  Output             1500 ml  Net           632.21 ml   Filed Weights   02/15/17 0547 02/16/17 0553 02/17/17 0425  Weight: 188 lb 3.2 oz (85.4 kg) 186 lb 6.4 oz (84.6 kg) 189 lb 1.6 oz (85.8 kg)    Telemetry    A-pacing - Personally Reviewed   Physical Exam  Pleasant elderly woman in NAD GEN: No acute distress.   Neck: No JVD Cardiac: RRR, Grade 3/6 late peaking harsh systolic murmur at the right upper sternal border Respiratory: Clear to auscultation bilaterally. GI: Soft, nontender, non-distended  MS: No edema; No deformity. Right radial site clear. Neuro:  Nonfocal  Psych: Normal affect   Labs    Chemistry Recent Labs Lab 02/15/17 0439 02/16/17 0533 02/17/17 0228  NA 141  140 139  K 4.5 5.2* 4.2  CL 111 109 112*  CO2 23 25 21*  GLUCOSE 136* 140* 95  BUN 46* 48* 45*  CREATININE 2.39* 2.52* 2.02*  CALCIUM 8.6* 9.0 7.7*  GFRNONAA 18* 17* 22*  GFRAA 20* 19* 25*  ANIONGAP 7 6 6      Hematology Recent Labs Lab 02/15/17 1352 02/16/17 0533 02/17/17 0228  WBC 8.1 7.6 6.5  RBC 4.33 4.16 3.49*  HGB 11.5* 11.0* 9.4*  HCT 35.7* 34.3* 29.2*  MCV 82.4 82.5 83.7  MCH 26.6 26.4 26.9  MCHC 32.2 32.1 32.2  RDW 15.1 15.0 15.0  PLT 221 203 163    Cardiac Enzymes Recent Labs Lab 02/12/17 1928 02/12/17 2345 02/13/17 0404  TROPONINI <0.03 <0.03 <0.03   No results for input(s): TROPIPOC in the last 168 hours.   BNPNo results for input(s): BNP, PROBNP in the last 168 hours.   DDimer  Recent Labs Lab 02/12/17 1928  DDIMER 0.89*     Radiology    No results found.  Cardiac Studies   2-D echocardiogram 02/15/2017: Study Conclusions  - Left  ventricle: The cavity size was normal. Wall thickness was   increased in a pattern of mild LVH. Systolic function was normal.   The estimated ejection fraction was in the range of 55% to 60%.   Wall motion was normal; there were no regional wall motion   abnormalities. Doppler parameters are consistent with abnormal   left ventricular relaxation (grade 1 diastolic dysfunction). - Aortic valve: Trileaflet; severely calcified leaflets. There was   severe stenosis. There was mild regurgitation. Mean gradient (S):   61 mm Hg. Peak gradient (S): 93 mm Hg. Valve area (VTI): 0.44   cm^2. - Mitral valve: Moderately calcified annulus. There was no   significant regurgitation. - Left atrium: The atrium was mildly dilated. - Right ventricle: The cavity size was normal. Pacer wire or   catheter noted in right ventricle. Systolic function was normal. - Right atrium: The atrium was mildly dilated. - Tricuspid valve: Peak RV-RA gradient (S): 22 mm Hg. - Pulmonary arteries: PA peak pressure: 25 mm Hg (S). - Inferior vena  cava: The vessel was normal in size. The   respirophasic diameter changes were in the normal range (>= 50%),   consistent with normal central venous pressure.  Impressions:  - Normal LV size with mild LV hypertrophy. EF 55-60%. Normal RV   size and systolic function. Severe aortic stenosis with mild   aortic insufficiency.  Cardiac catheterization 02/16/2017: Conclusion     Prox RCA to Mid RCA lesion, 90 %stenosed.  Mid Cx lesion, 70 %stenosed.   1.heavily calcified coronary arteries with significant two-vessel coronary artery disease affecting the proximal to mid right coronary artery and distal left circumflex supplying a small OM 3 branch.  2. Right heart catheterization showed normal filling pressures, no significant pulmonary hypertension and normal cardiac output.  3. Heavily calcified aortic valve with severely restricted opening. I did not attempt to cross the valve. This is known to be severely stenotic by echo.  Recommendations: Aortic valve replacement +1 vessel CABG to the right coronary artery versus TAVR and RCA PCI ( this requires atherectomy and high risk for contrast-induced nephropathy.       Patient Profile     83 y.o. female with severe Stage D1 aortic stenosis, severe single vessel CAD with stable angina exacerbated by anemia,   Assessment & Plan    1. Severe symptomatically aortic stenosis: We'll continue with TAVR evaluation. The patient appears stable at the present time. She will need a staged CT scan to evaluate vascular access and aortic annular size. Because of her chronic kidney disease we will plan to do this in the outpatient setting.  2. Severe single-vessel coronary artery disease: I reviewed the patient's coronary angiogram. She has severe calcific stenosis of the RCA. She really has not been having much angina except that associated with her anemia. However, her RCA is a large vessel and she will require revascularization if she is going  to have TAVR. It is important to evaluate her for GI bleeding prior PCI because she will need to be on dual antiplatelet therapy with aspirin and clopidogrel. Appreciate GI consult.  3. Anemia: Fecal occult blood positive. May partly be multifactorial with her chronic kidney disease as well. Appreciate GI consultation with tentative plans for upper endoscopy tomorrow.  4. Stage IV chronic kidney disease: Renal function is stable.  Disposition: Await final GI recommendations with possible EGD tomorrow. After cleared by GI would plan on proceeding with PCI of the right coronary artery which will require  atherectomy and stenting. Continue evaluation for Lenise Arena will occur as an outpatient.  Deatra James, MD  02/17/2017, 11:20 AM

## 2017-02-17 NOTE — Progress Notes (Addendum)
PROGRESS NOTE    Molly Cobb   ZOX:[id number]  DOB: 1932-03-06  DOA: 02/12/2017 PCP: Guadlupe Spanish, MD   Brief Narrative:  Molly Cobb 83 y.o. female with  severe Ao stenosis, Hypertension, dyslipidemia, stage IV chronic kidney disease, diabetes mellitus, pacemaker, anemia,  Hypothyroid presenting with chest pain.    Subjective: No complaints today.  ROS: no complaints of nausea, vomiting, constipation diarrhea, cough, dyspnea or dysuria. No other complaints.   Assessment & Plan:   Principal Problem:   Chest pain - myoview and VQ low probability - Cath showed 90% stenosis of RCA- cardiology plans on PCI after EGD  Active Problems: Ao Stenosis/Presyncope  - has had presyncopal episodes- seen by Dr Burt Knack who is considering a TAVR and would like the preliminary work up including above cath - Carotid duplex negative for significant stenosis - Dr Own has evaluated her and plans on TAVR at later date  CKD (chronic kidney disease) stage IV - close outpt f/u, cont Bicarb  Anemia FOB + - has apparently been taking Asprin this past week  - Hb 7/7 on admission- 2 U PRBC on  8/20- Hb now 11 - Iron deficiency noted-- 8/22 given IV  Iron - Nulecit - EGD per GI- CT surgery recommends holding off on colonoscopy until after aortic valve is fixed    Hypothyroidism (acquired) s/p thyroidectomy  - synthroid    Type 2 diabetes mellitus with stage 4 chronic kidney disease, without long-term current use of insulin (HCC) - Novolog 3 U TID and low dose SS Novolog - not on medication at home - check A1c in AM    SSS   S/P placement of cardiac pacemaker    DVT prophylaxis: Lovenox Code Status: Full code Family Communication: niece, Tammy Disposition Plan:  Home when stable Consultants:   Cardiology  GI  CT surgery Procedures:  Carotid Duplex (Doppler)  -  Preliminary findings: Findings consistent with a 1- 39 percent stenosis involving the right internal carotid artery and  the left internal carotid artery. Limited evaluation of plaque in the internal carotid arteries bilaterally due to deep nature of vessels.  Bilateral vertebral arteries appear patent and antegrade.  2 D ECHO Normal LV size with mild LV hypertrophy. EF 55-60%. Normal RV   size and systolic function. Severe aortic stenosis with mild   aortic insufficiency.  8/22 cardiac cath  Antimicrobials:  Anti-infectives    None       Objective: Vitals:   02/17/17 0432 02/17/17 0724 02/17/17 0929 02/17/17 1157  BP: (!) 127/50 (!) 141/48 131/87 (!) 112/47  Pulse: 77 70 71 71  Resp: 16 14  19   Temp: 98.3 F (36.8 C) 98.1 F (36.7 C)  97.8 F (36.6 C)  TempSrc: Oral Oral  Oral  SpO2: 100% 100%  100%  Weight:      Height:        Intake/Output Summary (Last 24 hours) at 02/17/17 1334 Last data filed at 02/17/17 1057  Gross per 24 hour  Intake          2132.21 ml  Output             1500 ml  Net           632.21 ml   Filed Weights   02/15/17 0547 02/16/17 0553 02/17/17 0425  Weight: 85.4 kg (188 lb 3.2 oz) 84.6 kg (186 lb 6.4 oz) 85.8 kg (189 lb 1.6 oz)    Examination: General exam: Appears comfortable  HEENT:  PERRLA, oral mucosa moist, no sclera icterus or thrush Respiratory system: Clear to auscultation. Respiratory effort normal. Cardiovascular system: S1 & S2 heard,  No murmurs  Gastrointestinal system: Abdomen soft, non-tender, nondistended. Normal bowel sound. No organomegaly Central nervous system: Alert and oriented. No focal neurological deficits. Extremities: No cyanosis, clubbing or edema Skin: No rashes or ulcers Psychiatry:  Mood & affect appropriate.    Data Reviewed: I have personally reviewed following labs and imaging studies  CBC:  Recent Labs Lab 02/12/17 1928 02/13/17 0404 02/15/17 0439 02/15/17 1352 02/16/17 0533 02/17/17 0228  WBC 9.1 8.1 8.5 8.1 7.6 6.5  NEUTROABS 6.6  --   --   --   --   --   HGB 7.7* 7.9* 10.7* 11.5* 11.0* 9.4*  HCT 24.2*  25.3* 33.1* 35.7* 34.3* 29.2*  MCV 82.6 81.1 81.1 82.4 82.5 83.7  PLT 212 223 204 221 203 932   Basic Metabolic Panel:  Recent Labs Lab 02/12/17 1928 02/15/17 0439 02/16/17 0533 02/17/17 0228  NA 138 141 140 139  K 4.4 4.5 5.2* 4.2  CL 108 111 109 112*  CO2 18* 23 25 21*  GLUCOSE 278* 136* 140* 95  BUN 60* 46* 48* 45*  CREATININE 2.64* 2.39* 2.52* 2.02*  CALCIUM 8.0* 8.6* 9.0 7.7*  MG  --   --  1.9  --    GFR: Estimated Creatinine Clearance: 21.9 mL/min (A) (by C-G formula based on SCr of 2.02 mg/dL (H)). Liver Function Tests: No results for input(s): AST, ALT, ALKPHOS, BILITOT, PROT, ALBUMIN in the last 168 hours. No results for input(s): LIPASE, AMYLASE in the last 168 hours. No results for input(s): AMMONIA in the last 168 hours. Coagulation Profile:  Recent Labs Lab 02/16/17 0533  INR 1.12   Cardiac Enzymes:  Recent Labs Lab 02/12/17 1928 02/12/17 2345 02/13/17 0404  TROPONINI <0.03 <0.03 <0.03   BNP (last 3 results) No results for input(s): PROBNP in the last 8760 hours. HbA1C: No results for input(s): HGBA1C in the last 72 hours. CBG:  Recent Labs Lab 02/16/17 1342 02/16/17 1801 02/16/17 2125 02/17/17 0724 02/17/17 1155  GLUCAP 118* 123* 182* 107* 215*   Lipid Profile: No results for input(s): CHOL, HDL, LDLCALC, TRIG, CHOLHDL, LDLDIRECT in the last 72 hours. Thyroid Function Tests: No results for input(s): TSH, T4TOTAL, FREET4, T3FREE, THYROIDAB in the last 72 hours. Anemia Panel: No results for input(s): VITAMINB12, FOLATE, FERRITIN, TIBC, IRON, RETICCTPCT in the last 72 hours. Urine analysis:    Component Value Date/Time   COLORURINE YELLOW 05/13/2008 0915   APPEARANCEUR CLEAR 05/13/2008 0915   LABSPEC 1.007 05/13/2008 0915   PHURINE 5.0 05/13/2008 0915   GLUCOSEU NEGATIVE 05/13/2008 0915   HGBUR NEGATIVE 05/13/2008 0915   BILIRUBINUR NEGATIVE 05/13/2008 0915   KETONESUR NEGATIVE 05/13/2008 0915   PROTEINUR NEGATIVE 05/13/2008 0915     UROBILINOGEN 0.2 05/13/2008 0915   NITRITE NEGATIVE 05/13/2008 0915   LEUKOCYTESUR SMALL (A) 05/13/2008 0915   Sepsis Labs: @LABRCNTIP (procalcitonin:4,lacticidven:4) )No results found for this or any previous visit (from the past 240 hour(s)).       Radiology Studies: No results found.  Scheduled Meds: . amLODipine  10 mg Oral Daily  . enoxaparin (LOVENOX) injection  30 mg Subcutaneous Q24H  . insulin aspart  0-9 Units Subcutaneous TID WC  . insulin aspart  3 Units Subcutaneous TID WC  . levothyroxine  100 mcg Oral QAC breakfast  . metoprolol tartrate  12.5 mg Oral BID  . pravastatin  80 mg  Oral QHS  . sodium bicarbonate  650 mg Oral QID  . sodium chloride flush  3 mL Intravenous Q12H   Continuous Infusions: . sodium chloride       LOS: 3 days    Time spent in minutes: 35    Debbe Odea, MD [facility] Pager: [email or web address] Password [email or web address] 02/17/2017, 1:34 PM

## 2017-02-18 ENCOUNTER — Encounter (HOSPITAL_COMMUNITY): Payer: Self-pay | Admitting: Gastroenterology

## 2017-02-18 ENCOUNTER — Inpatient Hospital Stay (HOSPITAL_COMMUNITY): Payer: Medicare Other | Admitting: Anesthesiology

## 2017-02-18 ENCOUNTER — Encounter (HOSPITAL_COMMUNITY)
Admission: EM | Disposition: A | Discharge status: Home Health | Payer: Self-pay | Source: Home / Self Care | Attending: Family Medicine

## 2017-02-18 ENCOUNTER — Other Ambulatory Visit: Payer: Self-pay | Admitting: Physician Assistant

## 2017-02-18 DIAGNOSIS — R195 Other fecal abnormalities: Secondary | ICD-10-CM

## 2017-02-18 DIAGNOSIS — K319 Disease of stomach and duodenum, unspecified: Secondary | ICD-10-CM

## 2017-02-18 DIAGNOSIS — K31811 Angiodysplasia of stomach and duodenum with bleeding: Secondary | ICD-10-CM

## 2017-02-18 DIAGNOSIS — K2990 Gastroduodenitis, unspecified, without bleeding: Secondary | ICD-10-CM

## 2017-02-18 DIAGNOSIS — K2970 Gastritis, unspecified, without bleeding: Secondary | ICD-10-CM

## 2017-02-18 HISTORY — PX: HOT HEMOSTASIS: SHX5433

## 2017-02-18 HISTORY — PX: ESOPHAGOGASTRODUODENOSCOPY: SHX5428

## 2017-02-18 LAB — HEMOGLOBIN A1C
Hgb A1c MFr Bld: 6.7 % — ABNORMAL HIGH (ref 4.8–5.6)
Mean Plasma Glucose: 145.59 mg/dL

## 2017-02-18 LAB — BASIC METABOLIC PANEL
ANION GAP: 8 (ref 5–15)
BUN: 53 mg/dL — AB (ref 6–20)
CO2: 23 mmol/L (ref 22–32)
Calcium: 8.8 mg/dL — ABNORMAL LOW (ref 8.9–10.3)
Chloride: 109 mmol/L (ref 101–111)
Creatinine, Ser: 2.51 mg/dL — ABNORMAL HIGH (ref 0.44–1.00)
GFR, EST AFRICAN AMERICAN: 19 mL/min — AB (ref >60)
GFR, EST NON AFRICAN AMERICAN: 17 mL/min — AB (ref >60)
GLUCOSE: 117 mg/dL — AB (ref 65–99)
POTASSIUM: 4.4 mmol/L (ref 3.5–5.1)
SODIUM: 140 mmol/L (ref 135–145)

## 2017-02-18 LAB — GLUCOSE, CAPILLARY
GLUCOSE-CAPILLARY: 124 mg/dL — AB (ref 65–99)
GLUCOSE-CAPILLARY: 153 mg/dL — AB (ref 65–99)

## 2017-02-18 LAB — CBC
HEMATOCRIT: 33 % — AB (ref 36.0–46.0)
HEMOGLOBIN: 10.3 g/dL — AB (ref 12.0–15.0)
MCH: 25.9 pg — ABNORMAL LOW (ref 26.0–34.0)
MCHC: 31.2 g/dL (ref 30.0–36.0)
MCV: 83.1 fL (ref 78.0–100.0)
Platelets: 184 10*3/uL (ref 150–400)
RBC: 3.97 MIL/uL (ref 3.87–5.11)
RDW: 14.6 % (ref 11.5–15.5)
WBC: 7.6 10*3/uL (ref 4.0–10.5)

## 2017-02-18 SURGERY — EGD (ESOPHAGOGASTRODUODENOSCOPY)
Anesthesia: Monitor Anesthesia Care

## 2017-02-18 MED ORDER — EPHEDRINE SULFATE 50 MG/ML IJ SOLN
INTRAMUSCULAR | Status: DC | PRN
  Administered 2017-02-18: 10 mg via INTRAVENOUS

## 2017-02-18 MED ORDER — PHENYLEPHRINE HCL 10 MG/ML IJ SOLN
INTRAMUSCULAR | Status: DC | PRN
  Administered 2017-02-18: 40 ug via INTRAVENOUS

## 2017-02-18 MED ORDER — METOPROLOL TARTRATE 25 MG PO TABS: 12.5000 mg | ORAL_TABLET | Freq: Two times a day (BID) | ORAL | 0 refills | Status: DC

## 2017-02-18 MED ORDER — PROPOFOL 500 MG/50ML IV EMUL
INTRAVENOUS | Status: DC | PRN
  Administered 2017-02-18: 75 ug/kg/min via INTRAVENOUS

## 2017-02-18 MED ORDER — CLOPIDOGREL BISULFATE 75 MG PO TABS: 75.0000 mg | ORAL_TABLET | Freq: Every day | ORAL | 0 refills | Status: DC

## 2017-02-18 MED ORDER — PANTOPRAZOLE SODIUM 40 MG PO TBEC
40.0000 mg | DELAYED_RELEASE_TABLET | Freq: Every day | ORAL | Status: DC
  Administered 2017-02-18: 40 mg via ORAL
  Filled 2017-02-18: qty 1

## 2017-02-18 MED ORDER — PANTOPRAZOLE SODIUM 40 MG PO TBEC: 40.0000 mg | DELAYED_RELEASE_TABLET | Freq: Every day | ORAL | 0 refills | Status: DC

## 2017-02-18 MED ORDER — LIDOCAINE HCL (CARDIAC) 20 MG/ML IV SOLN
INTRAVENOUS | Status: DC | PRN
  Administered 2017-02-18: 60 mg via INTRATRACHEAL

## 2017-02-18 MED ORDER — CLOPIDOGREL BISULFATE 75 MG PO TABS
75.0000 mg | ORAL_TABLET | Freq: Every day | ORAL | Status: DC
  Administered 2017-02-18: 75 mg via ORAL
  Filled 2017-02-18: qty 1

## 2017-02-18 NOTE — H&P (View-Only) (Signed)
[facility] Consult: 8:13 AM 02/17/2017  LOS: 3 days    Referring Provider: Dr Roxy Manns  Primary Care Physician:  Guadlupe Spanish, MD in [facility] Primary Gastroenterologist:  unassigned    Reason for Consultation:  FOBT + anemia.     HPI: Molly Cobb is a 83 y.o. female.  PMH stage 4 CKD (GFR 16) and AKI.  Type 2 DM.  Nephrolithiasis. SSS, S/p pacemaker.  Liver granulomas per 10/2016 CT.  S/p ORIF right femur fx.  DDD/DJD spine.   Previous unremarkable colonoscopy in [facility], ~ 5 or so years ago.  No previous EGD.     Admitted 02/12/17 with exertional SS chest pain, DOE.  Ruled in for demand ischemia in setting Hgb 7.7, MCV 82. (11/05/2016: Hgb 11.8 MCV 80).  FOBT +.  Severe Ao stenosis by echo.  Low risk nuclear stress test.  EF 45 to 50% (55-60% by echo).  8/22 Cath: 90% prox/midRCA, 70% mid Cx with rec "aortic valve replacement +1 vessel RCA CABG versus TAVR and RCA PCI (requires atherectomy and high risk for contrast-induced nephropathy).  Hgb 7.9 >> PRBC x 2 >> 11.5 >> 9.4.  Not iron deficient but ferritin is borderline at 12.  B12 not low. FOBT + brown stool.   S/p Ferric gluconate IV on 8/22.    3 weeks ago had 2 to 3 days of tarry stools, resolved and stools returned back to brown.  No N/V.  No dysphagia.  No heartburn.  Lately weight up some few pounds but in past years weighed as much as 350#.  No NSAIDs or ASA      Past Medical History:  Diagnosis Date  . CKD (chronic kidney disease)    a. followed by Dr. Neta Ehlers at [facility]   . Depression   . Diabetes mellitus ([place])   . Gout   . Hypertension   . Presence of permanent cardiac pacemaker    a. followed by [facility]  . Severe aortic stenosis    a. diagnosed 01/2017 during admission for chest pain.   . Thyroid disease     Past Surgical  History:  Procedure Laterality Date  . FEMUR FRACTURE SURGERY    . RIGHT/LEFT HEART CATH AND CORONARY ANGIOGRAPHY N/A 02/16/2017   Procedure: RIGHT/LEFT HEART CATH AND CORONARY ANGIOGRAPHY;  Surgeon: Wellington Hampshire, MD;  Location: [facility];  Service: Cardiovascular;  Laterality: N/A;  . ROTATOR CUFF REPAIR      Prior to Admission medications   Medication Sig Start Date End Date Taking? Authorizing Provider  amLODipine (NORVASC) 10 MG tablet Take 10 mg by mouth daily.   Yes [provider]  levothyroxine (SYNTHROID, LEVOTHROID) 100 MCG tablet Take 100 mcg by mouth daily before breakfast.   Yes [provider]  metoprolol tartrate (LOPRESSOR) 100 MG tablet Take 100 mg by mouth 2 (two) times daily.    Yes [provider]  pravastatin (PRAVACHOL) 80 MG tablet Take 80 mg by mouth at bedtime. 02/08/11  Yes [provider]  sodium bicarbonate 650 MG  tablet Take 650 mg by mouth 2 (two) times daily.    Yes [provider]  clonazePAM (KLONOPIN) 0.5 MG tablet Take 0.5 mg by mouth 2 (two) times daily as needed for anxiety.    [provider]  diclofenac sodium (VOLTAREN) 1 % GEL Apply 4 g topically 4 (four) times daily. Patient not taking: Reported on 02/13/2017 11/30/15   Palumbo, April, MD  lidocaine (LIDODERM) 5 % Place 1 patch onto the skin daily. Remove & Discard patch within 12 hours or as directed by MD Patient not taking: Reported on 02/13/2017 11/30/15   Palumbo, April, MD  traMADol (ULTRAM) 50 MG tablet Take 1 tablet (50 mg total) by mouth every 6 (six) hours as needed. Patient not taking: Reported on 02/13/2017 10/02/14   Comer Locket, PA-C    Scheduled Meds: . amLODipine  10 mg Oral Daily  . enoxaparin (LOVENOX) injection  30 mg Subcutaneous Q24H  . insulin aspart  0-9 Units Subcutaneous TID WC  . insulin aspart  3 Units Subcutaneous TID WC  . levothyroxine  100 mcg Oral QAC breakfast  . metoprolol tartrate  12.5 mg Oral BID    . pravastatin  80 mg Oral QHS  . sodium bicarbonate  650 mg Oral QID  . sodium chloride flush  3 mL Intravenous Q12H   Infusions: . sodium chloride     PRN Meds: sodium chloride, acetaminophen **OR** acetaminophen, clonazePAM, sodium chloride flush   Allergies as of 02/12/2017 - Review Complete 02/12/2017  Allergen Reaction Noted  . Sulfa antibiotics  10/02/2014    Family History  Problem Relation Age of Onset  . Heart disease Neg Hx   . Stroke Neg Hx   . Diabetes Neg Hx   . Glaucoma Mother  . Macular degeneration Mother  . Diabetes Father  . Hypertension Father  There is no family history of colon polyps, colon cancer, ulcer disease, anemia or need for transfusions.   Social History   Social History  . Marital status: Widowed    Spouse name: N/A  . Number of children: N/A  . Years of education: N/A   Occupational History  . Not on file.   Social History Main Topics  . Smoking status: Never Smoker  . Smokeless tobacco: Never Used  . Alcohol use No  . Drug use: No  . Sexual activity: Not on file   Other Topics Concern  . Not on file   Social History Narrative  . No narrative on file    REVIEW OF SYSTEMS: Constitutional:  Fatigue and weakness have improved over the last few days. ENT:  No nose bleeds Pulm:  Improved dyspnea on exertion. No productive cough. CV:  No palpitations, no LE edema. No chest pain GU:  No hematuria, no frequency GI:  Per HPI Heme:  No unusual bleeding or bruising.   Transfusions:  She thinks she may have had a transfusion decades ago, possibly related to childbirth. Neuro:  No headaches, no peripheral tingling or numbness Derm:  No itching, no rash or sores.  Endocrine:  No sweats or chills.  No polyuria or dysuria Immunization:  Did not inquire as to recent or previous immunizations. Travel:  None beyond local counties in last few months.    PHYSICAL EXAM: Vital signs in last 24 hours: Vitals:   02/17/17 0432 02/17/17  0724  BP: (!) 127/50 (!) 141/48  Pulse: 77 70  Resp: 16 14  Temp: 98.3 F (36.8 C) 98.1 F (36.7 C)  SpO2:  100% 100%   Wt Readings from Last 3 Encounters:  02/17/17 85.8 kg (189 lb 1.6 oz)  11/30/15 76.2 kg (168 lb)  10/02/14 75.8 kg (167 lb)    General: Pleasant, non-ill appearing elderly AAF. Head:  No facial edema or asymmetry. No signs of head trauma.  Eyes:  No scleral icterus Ears:  Not hard of hearing.  Nose:  No congestion or discharge Mouth:  Full dentures in place which were not removed for the exam. Oral mucosa is pink, moist, clear. Tongue midline. Neck:  No masses, thyromegaly, JVD. Lungs:  Clear bilaterally. No cough, no dyspnea. Heart: RRR. Harsh 3/6 systolic murmur. S1, S2 normal. Abdomen:  Nontender, nondistended. Active bowel sounds. No masses, HSM, bruits, hernias.  Pacemaker positioned in the upper left chest.  Rectal: Deferred rectal exam.  Stool specimen submitted to lab 8/22 is FOBT positive Musc/Skeltl: No joint swelling, erythema or significant deformities. Extremities:  No CCE.  Neurologic:  Alert. Oriented times 3. Full limb strength. No tremor. Skin:  No rashes, sores, suspicious lesions. Tattoos:  None Nodes:  No cervical adenopathy   Psych:  Pleasant, cooperative. Calm  Intake/Output from previous day: 08/22 0701 - 08/23 0700 In: 1892.2 [P.O.:360; I.V.:1432.2; IV Piggyback:100] Out: 1800 [[id number]] Intake/Output this shift: No intake/output data recorded.  LAB RESULTS:  Recent Labs  02/15/17 1352 02/16/17 0533 02/17/17 0228  WBC 8.1 7.6 6.5  HGB 11.5* 11.0* 9.4*  HCT 35.7* 34.3* 29.2*  PLT 221 203 163   BMET Lab Results  Component Value Date   NA 139 02/17/2017   NA 140 02/16/2017   NA 141 02/15/2017   K 4.2 02/17/2017   K 5.2 (H) 02/16/2017   K 4.5 02/15/2017   CL 112 (H) 02/17/2017   CL 109 02/16/2017   CL 111 02/15/2017   CO2 21 (L) 02/17/2017   CO2 25 02/16/2017   CO2 23 02/15/2017   GLUCOSE 95 02/17/2017    GLUCOSE 140 (H) 02/16/2017   GLUCOSE 136 (H) 02/15/2017   BUN 45 (H) 02/17/2017   BUN 48 (H) 02/16/2017   BUN 46 (H) 02/15/2017   CREATININE 2.02 (H) 02/17/2017   CREATININE 2.52 (H) 02/16/2017   CREATININE 2.39 (H) 02/15/2017   CALCIUM 7.7 (L) 02/17/2017   CALCIUM 9.0 02/16/2017   CALCIUM 8.6 (L) 02/15/2017   LFT No results for input(s): PROT, ALBUMIN, AST, ALT, ALKPHOS, BILITOT, BILIDIR, IBILI in the last 72 hours. PT/INR Lab Results  Component Value Date   INR 1.12 02/16/2017   INR 1.1 05/13/2008     RADIOLOGY STUDIES: No results found.   IMPRESSION:   *  Anemia, FOBT + stool.  Normocytic.  S/p PRBC x 2 on 8/20.  S/p ferric gluconate infusion 8/22.   With her advanced CKD, anemia not suprising.  ? If GI losses playing minor vs major role in anemia.   *  Severe Ao stenosis with RCA and mid Cx disease.  TAVR + PCI vs surgical AVR and single vsl CABG under consideration.  *  Stage 4 CKD.  Anemia in setting of late stage chronic kidney disease is very common.     PLAN:     *  EGD tomorrow.  D/w pt and she is agreeable.     Azucena Freed  02/17/2017, 8:13 AM Pager: [phone]  ________________________________________________________________________  Velora Heckler GI MD note:  I personally examined the patient, reviewed the data and agree with the assessment and plan described above.  No overt GI bleeding but heme +  mild anemia. With severe AS a colonoscopy my be a bit too taxing on her (agree with Dr. Roxy Manns note) but EGD should be safe. I am planning for that tomorrow (currently scheduled for 8:30am).  Thanks.   Owens Loffler, MD [facility] Pager [phone]

## 2017-02-18 NOTE — Discharge Summary (Signed)
Physician Discharge Summary  Sukari Grist OZD:[id number] DOB: Dec 15, 1931 DOA: 02/12/2017  PCP: Guadlupe Spanish, MD  Admit date: 02/12/2017 Discharge date: 02/18/2017  Admitted From: home  Disposition:  home   Recommendations for Outpatient Follow-up:  1. GI to f/u on H pylori 2. - new start on Plavix and Protonix  Home Health:  ordered  Discharge Condition:  stable Code Status: Full code Consultants:   Cardiology  GI  CT surgery   Discharge Diagnoses:  Principal Problem:   Chest pain- RCA stenosis Active Problems:   Benign hypertension with CKD (chronic kidney disease) stage IV (HCC)   Hypothyroidism (acquired)   Type 2 diabetes mellitus with stage 4 chronic kidney disease, without long-term current use of insulin (HCC)   SSS (sick sinus syndrome) ([place])   Anemia   Severe aortic stenosis   S/P placement of cardiac pacemaker   Stool guaiac positive   AVM (arteriovenous malformation) of stomach, acquired with hemorrhage   Gastritis and gastroduodenitis    Subjective: No complaints of chest pain or dyspnea.   Brief Summary: Truly Stankiewicz 83 y.o.femalewith  severe Ao stenosis, Hypertension, dyslipidemia, stage IV chronic kidney disease, diabetes mellitus, pacemaker, anemia,  Hypothyroid presenting with chest pain.    Hospital Course:  Principal Problem:   Chest pain - myoview and VQ low probability - Cath showed 90% stenosis of RCA- cardiology plans on PCI next week on Wednesday and recommends discharge today - Plavix added and Metoprolol dose lowered  Active Problems: Ao Stenosis/Presyncope  - has had presyncopal episodes- seen by Dr Burt Knack who is considering a TAVR and would like the preliminary work up including above cath - Carotid duplex negative for significant stenosis - Dr Clydene Laming has evaluated her and plans on TAVR at later date  Anemia FOB + - has apparently been taking Asprin this past week  - Hb 7/7 on admission- 2 U PRBC on  8/20- Hb now 11 -  Iron deficiency noted-- 8/22 given IV  Iron - Nulecit - EGD :   There was a single small, actively oozing gastric  AVM (slow persisent oozing) that I treated with APC  for hemostasis and to destroy the lesion. - Mild pan-gastritis, biospies to check for H.  pylori. - f/u H pylori- cont daily PPI - CT surgery recommends holding off on colonoscopy until after aortic valve is fixed    Hypothyroidism (acquired) s/p thyroidectomy  - synthroid  CKD (chronic kidney disease) stage IV - close outpt f/u, cont Bicarb    Type 2 diabetes mellitus with stage 4 chronic kidney disease, without long-term current use of insulin (HCC) - Novolog 3 U TID and low dose SS Novolog - not on medication at home -  A1c 6.7    SSS   S/P placement of cardiac pacemaker   Procedures:  Carotid Duplex (Doppler) - Preliminary findings: Findings consistent with a 1- 39 percent stenosis involving the right internal carotid artery and the left internal carotid artery. Limited evaluation of plaque in the internal carotid arteries bilaterally due to deep nature of vessels. Bilateral vertebral arteries appear patent and antegrade.  2 D ECHO Normal LV size with mild LV hypertrophy. EF 55-60%. Normal RV size and systolic function. Severe aortic stenosis with mild aortic insufficiency.  8/23 PFTs>> Moderately severe Diffusion Defect -Pulmonary Vascular  8/22 cardiac cath  8/24 EGD  Discharge Instructions  Discharge Instructions    Diet - low sodium heart healthy    Complete by:  As directed  Diet Carb Modified    Complete by:  As directed    Increase activity slowly    Complete by:  As directed      Allergies as of 02/18/2017      Reactions   Sulfa Antibiotics Nausea And Vomiting      Medication List    TAKE these medications   amLODipine 10 MG tablet Commonly known as:  NORVASC Take  10 mg by mouth daily.   clonazePAM 0.5 MG tablet Commonly known as:  KLONOPIN Take 0.5 mg by mouth 2 (two) times daily as needed for anxiety.   clopidogrel 75 MG tablet Commonly known as:  PLAVIX Take 1 tablet (75 mg total) by mouth daily.   diclofenac sodium 1 % Gel Commonly known as:  VOLTAREN Apply 4 g topically 4 (four) times daily.   levothyroxine 100 MCG tablet Commonly known as:  SYNTHROID, LEVOTHROID Take 100 mcg by mouth daily before breakfast.   lidocaine 5 % Commonly known as:  LIDODERM Place 1 patch onto the skin daily. Remove & Discard patch within 12 hours or as directed by MD   metoprolol tartrate 25 MG tablet Commonly known as:  LOPRESSOR Take 0.5 tablets (12.5 mg total) by mouth 2 (two) times daily. What changed:  medication strength  how much to take   pantoprazole 40 MG tablet Commonly known as:  PROTONIX Take 1 tablet (40 mg total) by mouth daily.   pravastatin 80 MG tablet Commonly known as:  PRAVACHOL Take 80 mg by mouth at bedtime.   sodium bicarbonate 650 MG tablet Take 650 mg by mouth 2 (two) times daily.   traMADol 50 MG tablet Commonly known as:  ULTRAM Take 1 tablet (50 mg total) by mouth every 6 (six) hours as needed.            Discharge Care Instructions        Start     Ordered   02/19/17 0000  pantoprazole (PROTONIX) 40 MG tablet  Daily     02/18/17 1637   02/18/17 0000  clopidogrel (PLAVIX) 75 MG tablet  Daily     02/18/17 1637   02/18/17 0000  metoprolol tartrate (LOPRESSOR) 25 MG tablet  2 times daily     02/18/17 1637   02/18/17 0000  Increase activity slowly     02/18/17 1637   02/18/17 0000  Diet - low sodium heart healthy     02/18/17 1637   02/18/17 0000  Diet Carb Modified     02/18/17 1637     Follow-up Information    [facility] Follow [facility].   Why:  for Home Health PT. They will be in contact with you in the next 1-2 days to set up your first home visit.  Contact information: [address] [place] [address] [phone]        [facility] Go on 02/23/2017.   Why:  Please arrive at the [facility] tower front desk @ 6am. Let them know you are scheduled for a heart cath and IV fluids beforehand.  Contact information: [place] [address] [phone]         Allergies  Allergen Reactions  . Sulfa Antibiotics Nausea And Vomiting     Procedures/Studies:    Dg Chest 2 View  Result Date: 02/12/2017 CLINICAL DATA:  Shortness of breath EXAM: CHEST  2 VIEW COMPARISON:  11/30/2015 FINDINGS: Left-sided multi lead pacing device as before. Streaky bibasilar  atelectasis. No acute consolidation or pleural effusion. Borderline cardiomegaly. Aortic atherosclerosis. No pneumothorax. Degenerative changes of the spine. IMPRESSION: Minimal basilar atelectasis and borderline cardiomegaly. Negative for acute infiltrate or edema. Electronically Signed   By: Donavan Foil M.D.   On: 02/12/2017 19:48   Nm Myocar Multi W/spect W/wall Motion / Ef  Result Date: 02/14/2017  There was no ST segment deviation noted during stress.  Defect 1: There is a medium defect of moderate severity present in the basal inferior, basal inferolateral, mid inferior, mid inferolateral and apical inferior location.  Findings consistent with prior myocardial infarction.  This is a low risk study.  The left ventricular ejection fraction is mildly decreased (45-54%).  Nuclear stress EF: 48%.  Low risk stress nuclear study with inferior scar with minimal peri-infarct ischemia and mildly reduced left ventricular global systolic function.   Nm Pulmonary Perf And Vent  Result Date: 02/13/2017 CLINICAL DATA:  Substernal chest pain radiating to the back. Shortness of breath. EXAM: NUCLEAR MEDICINE VENTILATION - PERFUSION LUNG SCAN TECHNIQUE: Ventilation images were obtained in multiple projections using inhaled aerosol Tc-42m DTPA. Perfusion images were  obtained in multiple projections after intravenous injection of Tc-8m MAA. RADIOPHARMACEUTICALS:  32.2 mCi Technetium-8m DTPA aerosol inhalation and 4.24 mCi Technetium-12m MAA IV COMPARISON:  None. FINDINGS: Ventilation: No focal ventilation defect. Perfusion: No wedge shaped peripheral perfusion defects to suggest acute pulmonary embolism. IMPRESSION: No scintigraphic evidence of pulmonary embolus. Electronically Signed   By: Kathreen Devoid   On: 02/13/2017 10:55       Discharge Exam: Vitals:   02/18/17 0845 02/18/17 1436  BP: (!) 142/33 (!) 114/51  Pulse: 72 73  Resp: 20 19  Temp:  98 F (36.7 C)  SpO2: 98% 100%   Vitals:   02/18/17 0837 02/18/17 0840 02/18/17 0845 02/18/17 1436  BP: (!) 135/44 (!) 135/44 (!) 142/33 (!) 114/51  Pulse: 70 70 72 73  Resp: (!) 21 (!) 23 20 19   Temp: 97.8 F (36.6 C)   98 F (36.7 C)  TempSrc: Oral   Oral  SpO2: 100% 100% 98% 100%  Weight:      Height:        General: Pt is alert, awake, not in acute distress Cardiovascular: RRR, S1/S2 +, 3/ 6 murmur at RUS border, no rubs, no gallops Respiratory: CTA bilaterally, no wheezing, no rhonchi Abdominal: Soft, NT, ND, bowel sounds + Extremities: no edema, no cyanosis    The results of significant diagnostics from this hospitalization (including imaging, microbiology, ancillary and laboratory) are listed below for reference.     Microbiology: No results found for this or any previous visit (from the past 240 hour(s)).   Labs: BNP (last 3 results) No results for input(s): BNP in the last 8760 hours. Basic Metabolic Panel:  Recent Labs Lab 02/12/17 1928 02/15/17 0439 02/16/17 0533 02/17/17 0228 02/18/17 0941  NA 138 141 140 139 140  K 4.4 4.5 5.2* 4.2 4.4  CL 108 111 109 112* 109  CO2 18* 23 25 21* 23  GLUCOSE 278* 136* 140* 95 117*  BUN 60* 46* 48* 45* 53*  CREATININE 2.64* 2.39* 2.52* 2.02* 2.51*  CALCIUM 8.0* 8.6* 9.0 7.7* 8.8*  MG  --   --  1.9  --   --    Liver Function  Tests: No results for input(s): AST, ALT, ALKPHOS, BILITOT, PROT, ALBUMIN in the last 168 hours. No results for input(s): LIPASE, AMYLASE in the last 168 hours. No results for input(s): AMMONIA  in the last 168 hours. CBC:  Recent Labs Lab 02/12/17 1928  02/15/17 0439 02/15/17 1352 02/16/17 0533 02/17/17 0228 02/18/17 1311  WBC 9.1  < > 8.5 8.1 7.6 6.5 7.6  NEUTROABS 6.6  --   --   --   --   --   --   HGB 7.7*  < > 10.7* 11.5* 11.0* 9.4* 10.3*  HCT 24.2*  < > 33.1* 35.7* 34.3* 29.2* 33.0*  MCV 82.6  < > 81.1 82.4 82.5 83.7 83.1  PLT 212  < > 204 221 203 163 184  < > = values in this interval not displayed. Cardiac Enzymes:  Recent Labs Lab 02/12/17 1928 02/12/17 2345 02/13/17 0404  TROPONINI <0.03 <0.03 <0.03   BNP: Invalid input(s): POCBNP CBG:  Recent Labs Lab 02/17/17 0724 02/17/17 1155 02/17/17 1640 02/18/17 0730 02/18/17 1112  GLUCAP 107* 215* 130* 124* 153*   D-Dimer No results for input(s): DDIMER in the last 72 hours. Hgb A1c  Recent Labs  02/18/17 0509  HGBA1C 6.7*   Lipid Profile No results for input(s): CHOL, HDL, LDLCALC, TRIG, CHOLHDL, LDLDIRECT in the last 72 hours. Thyroid function studies No results for input(s): TSH, T4TOTAL, T3FREE, THYROIDAB in the last 72 hours.  Invalid input(s): FREET3 Anemia work up No results for input(s): VITAMINB12, FOLATE, FERRITIN, TIBC, IRON, RETICCTPCT in the last 72 hours. Urinalysis    Component Value Date/Time   COLORURINE YELLOW 05/13/2008 0915   APPEARANCEUR CLEAR 05/13/2008 0915   LABSPEC 1.007 05/13/2008 0915   PHURINE 5.0 05/13/2008 0915   GLUCOSEU NEGATIVE 05/13/2008 0915   HGBUR NEGATIVE 05/13/2008 0915   BILIRUBINUR NEGATIVE 05/13/2008 0915   KETONESUR NEGATIVE 05/13/2008 0915   PROTEINUR NEGATIVE 05/13/2008 0915   UROBILINOGEN 0.2 05/13/2008 0915   NITRITE NEGATIVE 05/13/2008 0915   LEUKOCYTESUR SMALL (A) 05/13/2008 0915   Sepsis Labs Invalid input(s): PROCALCITONIN,  WBC,   LACTICIDVEN Microbiology No results found for this or any previous visit (from the past 240 hour(s)).   Time coordinating discharge: Over 30 minutes  SIGNED:   Debbe Odea, MD  [facility] 02/18/2017, 4:39 PM Pager   If 7PM-7AM, please contact night-coverage [email or web address] Password [email or web address]

## 2017-02-18 NOTE — Transfer of Care (Signed)
Immediate Anesthesia Transfer of Care Note  Patient: Molly Cobb  Procedure(s) Performed: Procedure(s): ESOPHAGOGASTRODUODENOSCOPY (EGD) (N/A)  Patient Location: Endoscopy Unit  Anesthesia Type:MAC  Level of Consciousness: awake, alert  and oriented  Airway & Oxygen Therapy: Patient Spontanous Breathing and Patient connected to nasal cannula oxygen  Post-op Assessment: Report given to RN and Post -op Vital signs reviewed and stable  Post vital signs: Reviewed and stable  Last Vitals:  Vitals:   02/18/17 0326 02/18/17 0743  BP: (!) 119/55 (!) 153/39  Pulse: 82 72  Resp: 19 10  Temp: 36.6 C 36.8 C  SpO2: 100% 100%    Last Pain:  Vitals:   02/18/17 0743  TempSrc: Oral  PainSc:       Patients Stated Pain Goal: 0 (20/94/70 9628)  Complications: No apparent anesthesia complications

## 2017-02-18 NOTE — Progress Notes (Addendum)
PROGRESS NOTE    Molly Cobb   WUJ:[id number]  DOB: 1931/12/11  DOA: 02/12/2017 PCP: Guadlupe Spanish, MD   Brief Narrative:  Molly Cobb 83 y.o. female with  severe Ao stenosis, Hypertension, dyslipidemia, stage IV chronic kidney disease, diabetes mellitus, pacemaker, anemia,  Hypothyroid presenting with chest pain.    Subjective: No complaints today.  ROS: no complaints of nausea, vomiting, constipation diarrhea, cough, dyspnea or dysuria. No other complaints.   Assessment & Plan:   Principal Problem:   Chest pain - myoview and VQ low probability - Cath showed 90% stenosis of RCA- cardiology plans on PCI after EGD  Active Problems: Ao Stenosis/Presyncope  - has had presyncopal episodes- seen by Dr Burt Knack who is considering a TAVR and would like the preliminary work up including above cath - Carotid duplex negative for significant stenosis - Dr Clydene Laming has evaluated her and plans on TAVR at later date  Anemia FOB + - has apparently been taking Asprin this past week  - Hb 7/7 on admission- 2 U PRBC on  8/20- Hb now 11 - Iron deficiency noted-- 8/22 given IV  Iron - Nulecit - EGD :   There was a single small, actively oozing gastric                            AVM (slow persisent oozing) that I treated with APC                            for hemostasis and to destroy the lesion.                           - Mild pan-gastritis, biospies to check for H.                            pylori. - f/u H pylori- cont daily PPI - CT surgery recommends holding off on colonoscopy until after aortic valve is fixed    Hypothyroidism (acquired) s/p thyroidectomy  - synthroid  CKD (chronic kidney disease) stage IV - close outpt f/u, cont Bicarb    Type 2 diabetes mellitus with stage 4 chronic kidney disease, without long-term current use of insulin (HCC) - Novolog 3 U TID and low dose SS Novolog - not on medication at home -  A1c 6.7    SSS   S/P placement of cardiac pacemaker    DVT  prophylaxis: Lovenox Code Status: Full code Family Communication: niece, Tammy Disposition Plan:  Home when stable Consultants:   Cardiology  GI  CT surgery Procedures:  Carotid Duplex (Doppler)  -  Preliminary findings: Findings consistent with a 1- 39 percent stenosis involving the right internal carotid artery and the left internal carotid artery. Limited evaluation of plaque in the internal carotid arteries bilaterally due to deep nature of vessels.  Bilateral vertebral arteries appear patent and antegrade.  2 D ECHO Normal LV size with mild LV hypertrophy. EF 55-60%. Normal RV   size and systolic function. Severe aortic stenosis with mild   aortic insufficiency.  8/23 PFTs>> Moderately severe Diffusion Defect -Pulmonary Vascular  8/22 cardiac cath  8/24 EGD  Antimicrobials:  Anti-infectives    None       Objective: Vitals:   02/18/17 0743 02/18/17 0837 02/18/17 0840 02/18/17 0845  BP: (!) 153/39 (!) 135/44 Marland Kitchen)  135/44 (!) 142/33  Pulse: 72 70 70 72  Resp: 10 (!) 21 (!) 23 20  Temp: 98.2 F (36.8 C) 97.8 F (36.6 C)    TempSrc: Oral Oral    SpO2: 100% 100% 100% 98%  Weight:      Height:        Intake/Output Summary (Last 24 hours) at 02/18/17 0911 Last data filed at 02/18/17 8563  Gross per 24 hour  Intake              730 ml  Output             2250 ml  Net            -1520 ml   Filed Weights   02/16/17 0553 02/17/17 0425 02/18/17 0326  Weight: 84.6 kg (186 lb 6.4 oz) 85.8 kg (189 lb 1.6 oz) 85.4 kg (188 lb 3.2 oz)    Examination: General exam: Appears comfortable  HEENT: PERRLA, oral mucosa moist, no sclera icterus or thrush Respiratory system: Clear to auscultation. Respiratory effort normal. Cardiovascular system: S1 & S2 heard,  No murmurs  Gastrointestinal system: Abdomen soft, non-tender, nondistended. Normal bowel sound. No organomegaly Central nervous system: Alert and oriented. No focal neurological deficits. Extremities: No cyanosis,  clubbing or edema Skin: No rashes or ulcers Psychiatry:  Mood & affect appropriate.    Data Reviewed: I have personally reviewed following labs and imaging studies  CBC:  Recent Labs Lab 02/12/17 1928 02/13/17 0404 02/15/17 0439 02/15/17 1352 02/16/17 0533 02/17/17 0228  WBC 9.1 8.1 8.5 8.1 7.6 6.5  NEUTROABS 6.6  --   --   --   --   --   HGB 7.7* 7.9* 10.7* 11.5* 11.0* 9.4*  HCT 24.2* 25.3* 33.1* 35.7* 34.3* 29.2*  MCV 82.6 81.1 81.1 82.4 82.5 83.7  PLT 212 223 204 221 203 149   Basic Metabolic Panel:  Recent Labs Lab 02/12/17 1928 02/15/17 0439 02/16/17 0533 02/17/17 0228  NA 138 141 140 139  K 4.4 4.5 5.2* 4.2  CL 108 111 109 112*  CO2 18* 23 25 21*  GLUCOSE 278* 136* 140* 95  BUN 60* 46* 48* 45*  CREATININE 2.64* 2.39* 2.52* 2.02*  CALCIUM 8.0* 8.6* 9.0 7.7*  MG  --   --  1.9  --    GFR: Estimated Creatinine Clearance: 21.9 mL/min (A) (by C-G formula based on SCr of 2.02 mg/dL (H)). Liver Function Tests: No results for input(s): AST, ALT, ALKPHOS, BILITOT, PROT, ALBUMIN in the last 168 hours. No results for input(s): LIPASE, AMYLASE in the last 168 hours. No results for input(s): AMMONIA in the last 168 hours. Coagulation Profile:  Recent Labs Lab 02/16/17 0533  INR 1.12   Cardiac Enzymes:  Recent Labs Lab 02/12/17 1928 02/12/17 2345 02/13/17 0404  TROPONINI <0.03 <0.03 <0.03   BNP (last 3 results) No results for input(s): PROBNP in the last 8760 hours. HbA1C:  Recent Labs  02/18/17 0509  HGBA1C 6.7*   CBG:  Recent Labs Lab 02/16/17 2125 02/17/17 0724 02/17/17 1155 02/17/17 1640 02/18/17 0730  GLUCAP 182* 107* 215* 130* 124*   Lipid Profile: No results for input(s): CHOL, HDL, LDLCALC, TRIG, CHOLHDL, LDLDIRECT in the last 72 hours. Thyroid Function Tests: No results for input(s): TSH, T4TOTAL, FREET4, T3FREE, THYROIDAB in the last 72 hours. Anemia Panel: No results for input(s): VITAMINB12, FOLATE, FERRITIN, TIBC, IRON,  RETICCTPCT in the last 72 hours. Urine analysis:    Component Value Date/Time  COLORURINE YELLOW 05/13/2008 0915   APPEARANCEUR CLEAR 05/13/2008 0915   LABSPEC 1.007 05/13/2008 0915   PHURINE 5.0 05/13/2008 0915   GLUCOSEU NEGATIVE 05/13/2008 0915   HGBUR NEGATIVE 05/13/2008 0915   BILIRUBINUR NEGATIVE 05/13/2008 0915   KETONESUR NEGATIVE 05/13/2008 0915   PROTEINUR NEGATIVE 05/13/2008 0915   UROBILINOGEN 0.2 05/13/2008 0915   NITRITE NEGATIVE 05/13/2008 0915   LEUKOCYTESUR SMALL (A) 05/13/2008 0915   Sepsis Labs: @LABRCNTIP (procalcitonin:4,lacticidven:4) )No results found for this or any previous visit (from the past 240 hour(s)).       Radiology Studies: No results found.  Scheduled Meds: . amLODipine  10 mg Oral Daily  . enoxaparin (LOVENOX) injection  30 mg Subcutaneous Q24H  . insulin aspart  0-9 Units Subcutaneous TID WC  . insulin aspart  3 Units Subcutaneous TID WC  . levothyroxine  100 mcg Oral QAC breakfast  . metoprolol tartrate  12.5 mg Oral BID  . pravastatin  80 mg Oral QHS  . sodium bicarbonate  650 mg Oral QID  . sodium chloride flush  3 mL Intravenous Q12H   Continuous Infusions: . sodium chloride 250 mL (02/18/17 0745)     LOS: 4 days    Time spent in minutes: 35    Debbe Odea, MD [facility] Pager: [email or web address] Password [email or web address] 02/18/2017, 9:11 AM

## 2017-02-18 NOTE — Anesthesia Preprocedure Evaluation (Signed)
Anesthesia Evaluation  Patient identified by MRN, date of birth, ID band Patient awake    Reviewed: Allergy & Precautions, NPO status , Patient's Chart, lab work & pertinent test results  Airway Mallampati: II   Neck ROM: Full    Dental no notable dental hx.    Pulmonary neg pulmonary ROS,    breath sounds clear to auscultation       Cardiovascular hypertension, + pacemaker  Rhythm:Regular Rate:Normal  Recent ECHO noted   Neuro/Psych    GI/Hepatic GI bleed   Endo/Other  diabetes  Renal/GU Renal InsufficiencyRenal disease     Musculoskeletal   Abdominal   Peds  Hematology  (+) anemia ,   Anesthesia Other Findings   Reproductive/Obstetrics                             Anesthesia Physical Anesthesia Plan  ASA: III  Anesthesia Plan:    Post-op Pain Management:    Induction: Intravenous  PONV Risk Score and Plan: 2 and Ondansetron and Dexamethasone  Airway Management Planned: Natural Airway and Nasal Cannula  Additional Equipment:   Intra-op Plan:   Post-operative Plan:   Informed Consent: I have reviewed the patients History and Physical, chart, labs and discussed the procedure including the risks, benefits and alternatives for the proposed anesthesia with the patient or authorized representative who has indicated his/her understanding and acceptance.     Plan Discussed with: CRNA  Anesthesia Plan Comments:         Anesthesia Quick Evaluation

## 2017-02-18 NOTE — Anesthesia Postprocedure Evaluation (Signed)
Anesthesia Post Note  Patient: Florestine Avers  Procedure(s) Performed: Procedure(s) (LRB): ESOPHAGOGASTRODUODENOSCOPY (EGD) (N/A) HOT HEMOSTASIS (ARGON PLASMA COAGULATION/BICAP) (N/A)     Patient location during evaluation: Endoscopy Anesthesia Type: MAC Level of consciousness: awake and alert Pain management: pain level controlled Vital Signs Assessment: post-procedure vital signs reviewed and stable Respiratory status: spontaneous breathing, nonlabored ventilation, respiratory function stable and patient connected to nasal cannula oxygen Cardiovascular status: stable and blood pressure returned to baseline Anesthetic complications: no    Last Vitals:  Vitals:   02/18/17 0840 02/18/17 0845  BP: (!) 135/44 (!) 142/33  Pulse: 70 72  Resp: (!) 23 20  Temp:    SpO2: 100% 98%    Last Pain:  Vitals:   02/18/17 0900  TempSrc:   PainSc: 0-No pain                 Dorraine Ellender,JAMES TERRILL

## 2017-02-18 NOTE — Discharge Instructions (Signed)
Aortic Valve Replacement, Care After Refer to this sheet in the next few weeks. These instructions provide you with information about caring for yourself after your procedure. Your health care provider may also give you more specific instructions. Your treatment has been planned according to current medical practices, but problems sometimes occur. Call your health care provider if you have any problems or questions after your procedure. What can I expect after the procedure? After the procedure, it is common to have:  Pain around your incision area.  A small amount of blood or clear fluid coming from your incision.  Follow these instructions at home: Eating and drinking   Follow instructions from your health care provider about eating or drinking restrictions. ? Limit alcohol intake to no more than 1 drink per day for nonpregnant women and 2 drinks per day for men. One drink equals 12 oz of beer, 5 oz of wine, or 1 oz of hard liquor. ? Limit how much caffeine you drink. Caffeine can affect your heart's rate and rhythm.  Drink enough fluid to keep your urine clear or pale yellow.  Eat a heart-healthy diet. This should include plenty of fresh fruits and vegetables. If you eat meat, it should be lean cuts. Avoid foods that are: ? High in salt, saturated fat, or sugar. ? Canned or highly processed. ? Fried. Activity  Return to your normal activities as told by your health care provider. Ask your health care provider what activities are safe for you.  Exercise regularly once you have recovered, as told by your health care provider.  Avoid sitting for more than 2 hours at a time without moving. Get up and move around at least once every 1-2 hours. This helps to prevent blood clots in the legs.  Do not lift anything that is heavier than 10 lb (4.5 kg) until your health care provider approves.  Avoid pushing or pulling things with your arms until your health care provider approves. This  includes pulling on handrails to help you climb stairs. Incision care   Follow instructions from your health care provider about how to take care of your incision. Make sure you: ? Wash your hands with soap and water before you change your bandage (dressing). If soap and water are not available, use hand sanitizer. ? Change your dressing as told by your health care provider. ? Leave stitches (sutures), skin glue, or adhesive strips in place. These skin closures may need to stay in place for 2 weeks or longer. If adhesive strip edges start to loosen and curl up, you may trim the loose edges. Do not remove adhesive strips completely unless your health care provider tells you to do that.  Check your incision area every day for signs of infection. Check for: ? More redness, swelling, or pain. ? More fluid or blood. ? Warmth. ? Pus or a bad smell. Medicines  Take over-the-counter and prescription medicines only as told by your health care provider.  If you were prescribed an antibiotic medicine, take it as told by your health care provider. Do not stop taking the antibiotic even if you start to feel better. Travel  Avoid airplane travel for as long as told by your health care provider.  When you travel, bring a list of your medicines and a record of your medical history with you. Carry your medicines with you. Driving  Ask your health care provider when it is safe for you to drive. Do not drive until your  health care provider approves.  Do not drive or operate heavy machinery while taking prescription pain medicine. Lifestyle   Do not use any tobacco products, such as cigarettes, chewing tobacco, or e-cigarettes. If you need help quitting, ask your health care provider.  Resume sexual activity as told by your health care provider. Do not use medicines for erectile dysfunction unless your health care provider approves, if this applies.  Work with your health care provider to keep your  blood pressure and cholesterol under control, and to manage any other heart conditions that you have.  Maintain a healthy weight. General instructions  Do not take baths, swim, or use a hot tub until your health care provider approves.  Do not strain to have a bowel movement.  Avoid crossing your legs while sitting down.  Check your temperature every day for a fever. A fever may be a sign of infection.  If you are a woman and you plan to become pregnant, talk with your health care provider before you become pregnant.  Wear compression stockings if your health care provider instructs you to do this. These stockings help to prevent blood clots and reduce swelling in your legs.  Tell all health care providers who care for you that you have an artificial (prosthetic) aortic valve. If you have or have had heart disease or endocarditis, tell all health care providers about these conditions as well.  Keep all follow-up visits as told by your health care provider. This is important. Contact a health care provider if:  You develop a skin rash.  You experience sudden, unexplained changes in your weight.  You have more redness, swelling, or pain around your incision.  You have more fluid or blood coming from your incision.  Your incision feels warm to the touch.  You have pus or a bad smell coming from your incision.  You have a fever. Get help right away if:  You develop chest pain that is different from the pain coming from your incision.  You develop shortness of breath or difficulty breathing.  You start to feel light-headed. These symptoms may represent a serious problem that is an emergency. Do not wait to see if the symptoms will go away. Get medical help right away. Call your local emergency services (911 in the [place] Do not drive yourself to the hospital. This information is not intended to replace advice given to you by your health care provider. Make sure you discuss any  questions you have with your health care provider. Document Released: 12/31/2004 Document Revised: 11/20/2015 Document Reviewed: 05/18/2015 Elsevier Interactive Patient Education  2017 [employer]. Please arrive at the [facility] [place] on 02/23/17 for your heart cath. Nothing to eat after midnight the night before. You can take your morning medications with a small sip of water. Do not take you diabetic medications since you will not be eating. Plan to stay the night at the hospital.

## 2017-02-18 NOTE — Op Note (Signed)
[facility] Patient Name: Molly Cobb Procedure Date : 02/18/2017 MRN: [id number] Attending MD: Milus Banister , MD Date of Birth: 1932/05/10 CSN: [id number] Age: 83 Admit Type: Outpatient Procedure:                Upper GI endoscopy Indications:              Iron deficiency anemia, Heme positive stool Providers:                Milus Banister, MD, Kingsley Plan, RN, Angus Seller, Marcene Duos, Technician Referring MD:              Medicines:                Monitored Anesthesia Care Complications:            No immediate complications. Estimated blood loss:                            None. Estimated Blood Loss:     Estimated blood loss: none. Procedure:                Pre-Anesthesia Assessment:                           - Prior to the procedure, a History and Physical                            was performed, and patient medications and                            allergies were reviewed. The patient's tolerance of                            previous anesthesia was also reviewed. The risks                            and benefits of the procedure and the sedation                            options and risks were discussed with the patient.                            All questions were answered, and informed consent                            was obtained. Prior Anticoagulants: The patient has                            taken Lovenox (enoxaparin), last dose was 1 day                            prior to procedure. ASA Grade Assessment: IV - A  patient with severe systemic disease that is a                            constant threat to life. After reviewing the risks                            and benefits, the patient was deemed in                            satisfactory condition to undergo the procedure.                           After obtaining informed consent, the endoscope was   passed under direct vision. Throughout the                            procedure, the patient's blood pressure, pulse, and                            oxygen saturations were monitored continuously. The                            [id number] ([id number]) scope was introduced through the                            mouth, and advanced to the second part of duodenum.                            The upper GI endoscopy was accomplished without                            difficulty. The patient tolerated the procedure                            well. Scope In: Scope Out: Findings:      The esophagus was normal.      A single 1-70mm bleeding AVM was found in the gastric antrum. This was       slowly but persistently oozing blood. Fulguration to stop the bleeding       and destroy the lesion by argon plasma coagulation (APC) was successful.      Minimal inflammation characterized by erythema was found in the entire       examined stomach. Biopsies were taken with a cold forceps for histology.      The examined duodenum was normal. Impression:               - There was a single small, actively oozing gastric                            AVM (slow persisent oozing) that I treated with APC                            for hemostasis and to destroy the lesion.                           -  Mild pan-gastritis, biospies to check for H.                            pylori. Moderate Sedation:      none Recommendation:           - She should remain on once daily PPI. If biopsies                            are positive for H. pylori, I will begin                            appropriate antibioitcs.                           - Please proceed with whatever care is needed for                            her CAD, severe AS and feel free to use any                            appropriate blood thinners. If there is concern for                            further, active GI bleeding please call or page.                            - She should probably have colonoscopy in the next                            few months, pending her cardiac course (she did                            have a normal colonoscopy about 5 years ago in [facility] and so it is unlikely she has signficant,                            neoplastic colon issues now). Procedure Code(s):        --- Professional ---                           58099, 67, Esophagogastroduodenoscopy, flexible,                            transoral; with control of bleeding, any method                           43239, Esophagogastroduodenoscopy, flexible,                            transoral; with biopsy, single or multiple Diagnosis Code(s):        --- Professional ---  K31.811, Angiodysplasia of stomach and duodenum                            with bleeding                           K29.70, Gastritis, unspecified, without bleeding                           D50.9, Iron deficiency anemia, unspecified                           R19.5, Other fecal abnormalities CPT copyright 2016 American Medical Association. All rights reserved. The codes documented in this report are preliminary and upon coder review may  be revised to meet current compliance requirements. Milus Banister, MD 02/18/2017 8:41:12 AM This report has been signed electronically. Number of Addenda: 0

## 2017-02-18 NOTE — Care Management Important Message (Signed)
Important Message  Patient Details  Name: Molly Cobb MRN: [id number] Date of Birth: 1931-07-24   Medicare Important Message Given:  Yes    Nathen May 02/18/2017, 10:51 AM

## 2017-02-18 NOTE — Progress Notes (Signed)
Patient Name: Molly Cobb Date of Encounter: 02/18/2017  Primary Cardiologist: Dr. Laqueta Linden [facility] Problem List     Principal Problem:   Chest pain Active Problems:   Benign hypertension with CKD (chronic kidney disease) stage IV (HCC)   Hypothyroidism (acquired)   Type 2 diabetes mellitus with stage 4 chronic kidney disease, without long-term current use of insulin (HCC)   SSS (sick sinus syndrome) (HCC)   Anemia   Severe aortic stenosis   S/P placement of cardiac pacemaker   Stool guaiac positive   AVM (arteriovenous malformation) of stomach, acquired with hemorrhage   Gastritis and gastroduodenitis     Subjective   No complaints. A little groggy after EGD. No chest pain or SOB.   Inpatient Medications    Scheduled Meds: . amLODipine  10 mg Oral Daily  . enoxaparin (LOVENOX) injection  30 mg Subcutaneous Q24H  . insulin aspart  0-9 Units Subcutaneous TID WC  . insulin aspart  3 Units Subcutaneous TID WC  . levothyroxine  100 mcg Oral QAC breakfast  . metoprolol tartrate  12.5 mg Oral BID  . pantoprazole  40 mg Oral Daily  . pravastatin  80 mg Oral QHS  . sodium bicarbonate  650 mg Oral QID  . sodium chloride flush  3 mL Intravenous Q12H   Continuous Infusions: . sodium chloride 250 mL (02/18/17 0745)   PRN Meds: sodium chloride, acetaminophen **OR** acetaminophen, clonazePAM, sodium chloride flush   Vital Signs    Vitals:   02/18/17 0743 02/18/17 0837 02/18/17 0840 02/18/17 0845  BP: (!) 153/39 (!) 135/44 (!) 135/44 (!) 142/33  Pulse: 72 70 70 72  Resp: 10 (!) 21 (!) 23 20  Temp: 98.2 F (36.8 C) 97.8 F (36.6 C)    TempSrc: Oral Oral    SpO2: 100% 100% 100% 98%  Weight:      Height:        Intake/Output Summary (Last 24 hours) at 02/18/17 1042 Last data filed at 02/18/17 1005  Gross per 24 hour  Intake              490 ml  Output             3000 ml  Net            -2510 ml   Filed Weights   02/16/17 0553  02/17/17 0425 02/18/17 0326  Weight: 186 lb 6.4 oz (84.6 kg) 189 lb 1.6 oz (85.8 kg) 188 lb 3.2 oz (85.4 kg)    Physical Exam   GEN: Well nourished, well developed, in no acute distress.  HEENT: Grossly normal.  Neck: Supple, no JVD, carotid bruits, or masses. Cardiac: RRR, rubs, or gallops. No clubbing, cyanosis, edema.  Radials/DP/PT 2+ and equal bilaterally. Grade 3/6 late peaking harsh systolic murmur at the right upper sternal border Respiratory:  Respirations regular and unlabored, clear to auscultation bilaterally. GI: Soft, nontender, nondistended, BS + x 4. MS: no deformity or atrophy. Skin: warm and dry, no rash. Neuro:  Strength and sensation are intact. Psych: AAOx3.  Normal affect.  Labs    CBC  Recent Labs  02/16/17 0533 02/17/17 0228  WBC 7.6 6.5  HGB 11.0* 9.4*  HCT 34.3* 29.2*  MCV 82.5 83.7  PLT 203 696   Basic Metabolic Panel  Recent Labs  02/16/17 0533 02/17/17 0228 02/18/17 0941  NA 140 139 140  K 5.2* 4.2 4.4  CL 109 112* 109  CO2 25  21* 23  GLUCOSE 140* 95 117*  BUN 48* 45* 53*  CREATININE 2.52* 2.02* 2.51*  CALCIUM 9.0 7.7* 8.8*  MG 1.9  --   --     Telemetry    A pacing - Personally Reviewed  ECG    A paced. HR 80 with nonspecific ST/TW changes  - Personally Reviewed  Radiology    No results found.  Cardiac Studies   2-D ECHO: 02/15/2017 Study Conclusions - Left ventricle: The cavity size was normal. Wall thickness was increased in a pattern of mild LVH. Systolic function was normal. The estimated ejection fraction was in the range of 55% to 60%. Wall motion was normal; there were no regional wall motion abnormalities. Doppler parameters are consistent with abnormal left ventricular relaxation (grade 1 diastolic dysfunction). - Aortic valve: Trileaflet; severely calcified leaflets. There was severe stenosis. There was mild regurgitation. Mean gradient (S): 61 mm Hg. Peak gradient (S): 93 mm Hg. Valve area  (VTI): 0.44 cm^2. - Mitral valve: Moderately calcified annulus. There was no significant regurgitation. - Left atrium: The atrium was mildly dilated. - Right ventricle: The cavity size was normal. Pacer wire or catheter noted in right ventricle. Systolic function was normal. - Right atrium: The atrium was mildly dilated. - Tricuspid valve: Peak RV-RA gradient (S): 22 mm Hg. - Pulmonary arteries: PA peak pressure: 25 mm Hg (S). - Inferior vena cava: The vessel was normal in size. The respirophasic diameter changes were in the normal range (>= 50%), consistent with normal central venous pressure. Impressions: - Normal LV size with mild LV hypertrophy. EF 55-60%. Normal RV size and systolic function. Severe aortic stenosis with mild aortic insufficiency.   Cardiac catheterization 02/16/2017: Conclusion    Prox RCA to Mid RCA lesion, 90 %stenosed.  Mid Cx lesion, 70 %stenosed.  1.heavily calcified coronary arteries with significant two-vessel coronary artery disease affecting the proximal to mid right coronary artery and distal left circumflex supplying a small OM 3 branch.  2. Right heart catheterization showed normal filling pressures, no significant pulmonary hypertension and normal cardiac output.  3. Heavily calcified aortic valve with severely restricted opening. I did not attempt to cross the valve. This is known to be severely stenotic by echo.  Recommendations: Aortic valve replacement +1 vessel CABG to the right coronary artery versus TAVR and RCA PCI ( this requires atherectomy and high risk for contrast-induced nephropathy.      Patient Profile     Molly Cobb is a 83 y.o. female with a hx of HTN, HLD, hypothyroidism, stage IV CKD, DMT2, s/p PPM, anemia, severe stage D1 aortic stenosis and single vessel CAD who presented to [facility] on 02/13/17 with angina in the setting of acute anemia.   Assessment & Plan    Severe symptomatic AS: continue with  TAVR work up. She will require a staged CT scan to evaluate vascular access and aortic annular size. Because of her chronic kidney disease we will plan to do this in the outpatient setting.  Severe single vessel CAD: LHC on 8/22 showed severe calcific stenosis of the RCA. She really has not been having much angina except that associated with her anemia. However, her RCA is a large vessel and she will require revascularization (atherectomy and stenting) if she is going to have TAVR. GI consulted for evaluation given that she will need DAPT after PCI. EGD today showed an AVM s/p APC and she was cleared for PCI with DAPT. Will have this set up for next Wednesday  with Dr. Burt Knack. She will need pre hydration prior to procedure. Will start plavix 75mg  daily today which should be continued at discharge.   Anemia: FOBT positive. GI on board. EGD this morning showed an actively oozing AVM s/p APC and mild pan gastritis. Dr Ardis Hughs cleared started her on a PPI, tested for H. Pylori and cleared her for PCI and TAVR with appropriate antiplatelet therapy. Hg 11--> 9.4. Will check CBC today  Stage IV CKD: renal function has remained stable. Creat 2.51 and GFR 19.   Signed, Angelena Form, PA-C  02/18/2017, 10:42 AM   Patient seen, examined. Available data reviewed. Agree with findings, assessment, and plan as outlined by Nell Range, PA-C. On my exam today the patient is alert and oriented, in no distress. Lungs are clear. Heart is regular rate and rhythm with a grade 3/6 harsh late peaking systolic murmur at the right upper sternal border. Abdomen is soft and nontender. Extremities show no edema.  The patient underwent EGD this morning. Those results are reviewed. She underwent treatment for an AVM. She will be continued on a proton pump inhibitor and H. pylori testing has been sent off. She is cleared to start clopidogrel tomorrow. I think she is stable for hospital discharge from a cardiac perspective. We have  arranged for her to come back next Wednesday for atherectomy and stenting of the right coronary artery. Will recheck her labs that day to make sure that her creatinine and hemoglobin are stable. I have reviewed our plan in detail with the patient and will return to talk with her when her daughter arrives at the hospital. Orders are written for PCI next Wednesday and instructions will be given to the patient. After her PCI is completed, she will continue to undergo CT angiography and further outpatient pre-TAVR studies.  [name], M.D. 02/18/2017 1:08 PM

## 2017-02-18 NOTE — Interval H&P Note (Signed)
History and Physical Interval Note:  02/18/2017 7:34 AM  Molly Cobb  has presented today for surgery, with the diagnosis of Anemia, FOBT positive.  The various methods of treatment have been discussed with the patient and family. After consideration of risks, benefits and other options for treatment, the patient has consented to  Procedure(s): ESOPHAGOGASTRODUODENOSCOPY (EGD) (N/A) as a surgical intervention .  The patient's history has been reviewed, patient examined, no change in status, stable for surgery.  I have reviewed the patient's chart and labs.  Questions were answered to the patient's satisfaction.     Milus Banister

## 2017-02-21 ENCOUNTER — Encounter (HOSPITAL_COMMUNITY): Payer: Self-pay | Admitting: Gastroenterology

## 2017-02-23 ENCOUNTER — Ambulatory Visit (HOSPITAL_COMMUNITY)
Admission: RE | Admit: 2017-02-23 | Discharge: 2017-02-24 | Disposition: A | Discharge status: Home / Self Care | Payer: Medicare Other | Source: Ambulatory Visit | Attending: Cardiovascular Disease | Admitting: Cardiovascular Disease

## 2017-02-23 ENCOUNTER — Ambulatory Visit (HOSPITAL_COMMUNITY)
Admission: RE | Disposition: A | Discharge status: Home / Self Care | Payer: Self-pay | Source: Ambulatory Visit | Attending: Cardiovascular Disease

## 2017-02-23 DIAGNOSIS — E1122 Type 2 diabetes mellitus with diabetic chronic kidney disease: Secondary | ICD-10-CM | POA: Diagnosis not present

## 2017-02-23 DIAGNOSIS — Z7982 Long term (current) use of aspirin: Secondary | ICD-10-CM | POA: Insufficient documentation

## 2017-02-23 DIAGNOSIS — I25118 Atherosclerotic heart disease of native coronary artery with other forms of angina pectoris: Secondary | ICD-10-CM | POA: Diagnosis present

## 2017-02-23 DIAGNOSIS — E785 Hyperlipidemia, unspecified: Secondary | ICD-10-CM | POA: Insufficient documentation

## 2017-02-23 DIAGNOSIS — I25119 Atherosclerotic heart disease of native coronary artery with unspecified angina pectoris: Secondary | ICD-10-CM | POA: Diagnosis not present

## 2017-02-23 DIAGNOSIS — N184 Chronic kidney disease, stage 4 (severe): Secondary | ICD-10-CM | POA: Diagnosis not present

## 2017-02-23 DIAGNOSIS — Z950 Presence of cardiac pacemaker: Secondary | ICD-10-CM | POA: Diagnosis not present

## 2017-02-23 DIAGNOSIS — I350 Nonrheumatic aortic (valve) stenosis: Secondary | ICD-10-CM | POA: Insufficient documentation

## 2017-02-23 DIAGNOSIS — I495 Sick sinus syndrome: Secondary | ICD-10-CM | POA: Insufficient documentation

## 2017-02-23 DIAGNOSIS — I129 Hypertensive chronic kidney disease with stage 1 through stage 4 chronic kidney disease, or unspecified chronic kidney disease: Secondary | ICD-10-CM | POA: Insufficient documentation

## 2017-02-23 DIAGNOSIS — Z794 Long term (current) use of insulin: Secondary | ICD-10-CM | POA: Insufficient documentation

## 2017-02-23 DIAGNOSIS — E039 Hypothyroidism, unspecified: Secondary | ICD-10-CM | POA: Insufficient documentation

## 2017-02-23 DIAGNOSIS — Z7902 Long term (current) use of antithrombotics/antiplatelets: Secondary | ICD-10-CM | POA: Diagnosis not present

## 2017-02-23 DIAGNOSIS — Z79899 Other long term (current) drug therapy: Secondary | ICD-10-CM | POA: Diagnosis not present

## 2017-02-23 DIAGNOSIS — I2584 Coronary atherosclerosis due to calcified coronary lesion: Secondary | ICD-10-CM | POA: Insufficient documentation

## 2017-02-23 HISTORY — DX: Atherosclerotic heart disease of native coronary artery without angina pectoris: I25.10

## 2017-02-23 HISTORY — PX: CORONARY ATHERECTOMY: CATH118238

## 2017-02-23 LAB — COMPREHENSIVE METABOLIC PANEL
ALBUMIN: 3.5 g/dL (ref 3.5–5.0)
ALK PHOS: 93 U/L (ref 38–126)
ALT: 29 U/L (ref 14–54)
ANION GAP: 9 (ref 5–15)
AST: 34 U/L (ref 15–41)
BUN: 43 mg/dL — ABNORMAL HIGH (ref 6–20)
CALCIUM: 8.7 mg/dL — AB (ref 8.9–10.3)
CHLORIDE: 107 mmol/L (ref 101–111)
CO2: 22 mmol/L (ref 22–32)
Creatinine, Ser: 2.47 mg/dL — ABNORMAL HIGH (ref 0.44–1.00)
GFR calc Af Amer: 20 mL/min — ABNORMAL LOW (ref >60)
GFR calc non Af Amer: 17 mL/min — ABNORMAL LOW (ref >60)
GLUCOSE: 115 mg/dL — AB (ref 65–99)
POTASSIUM: 4.8 mmol/L (ref 3.5–5.1)
Sodium: 138 mmol/L (ref 135–145)
Total Bilirubin: 0.5 mg/dL (ref 0.3–1.2)
Total Protein: 6.7 g/dL (ref 6.5–8.1)

## 2017-02-23 LAB — CBC
HCT: 31.2 % — ABNORMAL LOW (ref 36.0–46.0)
Hemoglobin: 9.8 g/dL — ABNORMAL LOW (ref 12.0–15.0)
MCH: 26.3 pg (ref 26.0–34.0)
MCHC: 31.4 g/dL (ref 30.0–36.0)
MCV: 83.9 fL (ref 78.0–100.0)
PLATELETS: 191 10*3/uL (ref 150–400)
RBC: 3.72 MIL/uL — ABNORMAL LOW (ref 3.87–5.11)
RDW: 14.6 % (ref 11.5–15.5)
WBC: 6.9 10*3/uL (ref 4.0–10.5)

## 2017-02-23 LAB — GLUCOSE, CAPILLARY
GLUCOSE-CAPILLARY: 106 mg/dL — AB (ref 65–99)
GLUCOSE-CAPILLARY: 76 mg/dL (ref 65–99)
GLUCOSE-CAPILLARY: 131 mg/dL — AB (ref 65–99)
Glucose-Capillary: 162 mg/dL — ABNORMAL HIGH (ref 65–99)

## 2017-02-23 LAB — POCT ACTIVATED CLOTTING TIME
ACTIVATED CLOTTING TIME: 257 s
Activated Clotting Time: 296 seconds
Activated Clotting Time: 263 seconds

## 2017-02-23 LAB — PROTIME-INR
INR: 1.07
Prothrombin Time: 13.8 seconds (ref 11.4–15.2)

## 2017-02-23 SURGERY — CORONARY ATHERECTOMY
Anesthesia: LOCAL

## 2017-02-23 MED ORDER — LIVING WELL WITH DIABETES BOOK
Freq: Once | Status: AC
  Administered 2017-02-23: 21:00:00
  Filled 2017-02-23: qty 1

## 2017-02-23 MED ORDER — ASPIRIN 81 MG PO CHEW
81.0000 mg | CHEWABLE_TABLET | Freq: Every day | ORAL | Status: DC
  Administered 2017-02-24: 81 mg via ORAL
  Filled 2017-02-23: qty 1

## 2017-02-23 MED ORDER — SODIUM CHLORIDE 0.9 % IV SOLN: 250.0000 mL | INTRAVENOUS | Status: DC | PRN

## 2017-02-23 MED ORDER — SODIUM CHLORIDE 0.9% FLUSH: 3.0000 mL | INTRAVENOUS | Status: DC | PRN

## 2017-02-23 MED ORDER — METOPROLOL TARTRATE 12.5 MG HALF TABLET
12.5000 mg | ORAL_TABLET | Freq: Two times a day (BID) | ORAL | Status: DC
Start: 2017-02-23 — End: 2017-02-24
  Administered 2017-02-23 – 2017-02-24 (×2): 12.5 mg via ORAL
  Filled 2017-02-23 (×2): qty 1

## 2017-02-23 MED ORDER — ONDANSETRON HCL 4 MG/2ML IJ SOLN: 4.0000 mg | Freq: Four times a day (QID) | INTRAMUSCULAR | Status: DC | PRN

## 2017-02-23 MED ORDER — ACETAMINOPHEN 325 MG PO TABS: 650.0000 mg | ORAL_TABLET | ORAL | Status: DC | PRN

## 2017-02-23 MED ORDER — FENTANYL CITRATE (PF) 100 MCG/2ML IJ SOLN
INTRAMUSCULAR | Status: AC
  Filled 2017-02-23: qty 2

## 2017-02-23 MED ORDER — ASPIRIN 81 MG PO CHEW
81.0000 mg | CHEWABLE_TABLET | ORAL | Status: AC
  Administered 2017-02-23: 81 mg via ORAL

## 2017-02-23 MED ORDER — MIDAZOLAM HCL 2 MG/2ML IJ SOLN
INTRAMUSCULAR | Status: DC | PRN
Start: 2017-02-23 — End: 2017-02-23
  Administered 2017-02-23: 1 mg via INTRAVENOUS

## 2017-02-23 MED ORDER — CLOPIDOGREL BISULFATE 75 MG PO TABS
75.0000 mg | ORAL_TABLET | Freq: Every day | ORAL | Status: DC
  Administered 2017-02-24: 75 mg via ORAL
  Filled 2017-02-23: qty 1

## 2017-02-23 MED ORDER — SODIUM CHLORIDE 0.9 % WEIGHT BASED INFUSION: 1.0000 mL/kg/h | INTRAVENOUS | Status: AC

## 2017-02-23 MED ORDER — CLOPIDOGREL BISULFATE 75 MG PO TABS
ORAL_TABLET | ORAL | Status: AC
  Filled 2017-02-23: qty 1

## 2017-02-23 MED ORDER — HEPARIN (PORCINE) IN NACL 2-0.9 UNIT/ML-% IJ SOLN
INTRAMUSCULAR | Status: AC | PRN
  Administered 2017-02-23: 1000 mL

## 2017-02-23 MED ORDER — ANGIOPLASTY BOOK
Freq: Once | Status: AC
  Administered 2017-02-23: 21:00:00
  Filled 2017-02-23: qty 1

## 2017-02-23 MED ORDER — SODIUM CHLORIDE 0.9% FLUSH
3.0000 mL | Freq: Two times a day (BID) | INTRAVENOUS | Status: DC
  Administered 2017-02-24: 3 mL via INTRAVENOUS

## 2017-02-23 MED ORDER — NITROGLYCERIN 1 MG/10 ML FOR IR/CATH LAB
INTRA_ARTERIAL | Status: AC
  Filled 2017-02-23: qty 10

## 2017-02-23 MED ORDER — HEPARIN (PORCINE) IN NACL 2-0.9 UNIT/ML-% IJ SOLN
INTRAMUSCULAR | Status: AC
  Filled 2017-02-23: qty 1000

## 2017-02-23 MED ORDER — HYDRALAZINE HCL 20 MG/ML IJ SOLN: 5.0000 mg | INTRAMUSCULAR | Status: AC | PRN

## 2017-02-23 MED ORDER — SODIUM BICARBONATE 650 MG PO TABS
650.0000 mg | ORAL_TABLET | Freq: Two times a day (BID) | ORAL | Status: DC
  Administered 2017-02-23 – 2017-02-24 (×2): 650 mg via ORAL
  Filled 2017-02-23 (×2): qty 1

## 2017-02-23 MED ORDER — CLOPIDOGREL BISULFATE 75 MG PO TABS
75.0000 mg | ORAL_TABLET | Freq: Once | ORAL | Status: AC
  Administered 2017-02-23: 75 mg via ORAL

## 2017-02-23 MED ORDER — PRAVASTATIN SODIUM 40 MG PO TABS
80.0000 mg | ORAL_TABLET | Freq: Every day | ORAL | Status: DC
  Administered 2017-02-23: 21:00:00 80 mg via ORAL
  Filled 2017-02-23: qty 2

## 2017-02-23 MED ORDER — HEPARIN SODIUM (PORCINE) 1000 UNIT/ML IJ SOLN
INTRAMUSCULAR | Status: DC | PRN
Start: 2017-02-23 — End: 2017-02-23
  Administered 2017-02-23: 7000 [IU] via INTRAVENOUS
  Administered 2017-02-23: 2000 [IU] via INTRAVENOUS

## 2017-02-23 MED ORDER — ACTIVE PARTNERSHIP FOR HEALTH OF YOUR HEART BOOK
Freq: Once | Status: AC
  Administered 2017-02-23: 21:00:00
  Filled 2017-02-23: qty 1

## 2017-02-23 MED ORDER — MIDAZOLAM HCL 2 MG/2ML IJ SOLN
INTRAMUSCULAR | Status: AC
  Filled 2017-02-23: qty 2

## 2017-02-23 MED ORDER — SODIUM CHLORIDE 0.9% FLUSH: 3.0000 mL | Freq: Two times a day (BID) | INTRAVENOUS | Status: DC

## 2017-02-23 MED ORDER — IOPAMIDOL (ISOVUE-370) INJECTION 76%
INTRAVENOUS | Status: AC
  Filled 2017-02-23: qty 125

## 2017-02-23 MED ORDER — MORPHINE SULFATE (PF) 4 MG/ML IV SOLN: 1.0000 mg | INTRAVENOUS | Status: DC | PRN

## 2017-02-23 MED ORDER — FENTANYL CITRATE (PF) 100 MCG/2ML IJ SOLN
INTRAMUSCULAR | Status: DC | PRN
  Administered 2017-02-23: 25 ug via INTRAVENOUS

## 2017-02-23 MED ORDER — SODIUM CHLORIDE 0.9 % IV SOLN
INTRAVENOUS | Status: DC
  Administered 2017-02-23 (×2): via INTRAVENOUS

## 2017-02-23 MED ORDER — VERAPAMIL HCL 2.5 MG/ML IV SOLN
INTRAVENOUS | Status: AC
  Filled 2017-02-23: qty 2

## 2017-02-23 MED ORDER — VERAPAMIL HCL 2.5 MG/ML IV SOLN
INTRAVENOUS | Status: DC | PRN
  Administered 2017-02-23: 10 mL via INTRA_ARTERIAL

## 2017-02-23 MED ORDER — SODIUM CHLORIDE 0.9% FLUSH
3.0000 mL | INTRAVENOUS | Status: DC | PRN
Start: 2017-02-23 — End: 2017-02-23

## 2017-02-23 MED ORDER — LIDOCAINE HCL (PF) 1 % IJ SOLN
INTRAMUSCULAR | Status: AC
  Filled 2017-02-23: qty 30

## 2017-02-23 MED ORDER — LEVOTHYROXINE SODIUM 100 MCG PO TABS
100.0000 ug | ORAL_TABLET | Freq: Every day | ORAL | Status: DC
  Administered 2017-02-23 – 2017-02-24 (×2): 100 ug via ORAL
  Filled 2017-02-23 (×2): qty 1

## 2017-02-23 MED ORDER — VIPERSLIDE LUBRICANT OPTIME
TOPICAL | Status: DC | PRN
  Administered 2017-02-23: 13:00:00 via SURGICAL_CAVITY

## 2017-02-23 MED ORDER — LABETALOL HCL 5 MG/ML IV SOLN: 10.0000 mg | INTRAVENOUS | Status: AC | PRN

## 2017-02-23 MED ORDER — ASPIRIN 81 MG PO CHEW
CHEWABLE_TABLET | ORAL | Status: AC
  Filled 2017-02-23: qty 1

## 2017-02-23 MED ORDER — LIDOCAINE HCL (PF) 1 % IJ SOLN
INTRAMUSCULAR | Status: DC | PRN
  Administered 2017-02-23: 2 mL

## 2017-02-23 MED ORDER — IOPAMIDOL (ISOVUE-370) INJECTION 76%
INTRAVENOUS | Status: DC | PRN
Start: 2017-02-23 — End: 2017-02-23
  Administered 2017-02-23: 25 mL via INTRA_ARTERIAL

## 2017-02-23 MED ORDER — HEPARIN SODIUM (PORCINE) 1000 UNIT/ML IJ SOLN
INTRAMUSCULAR | Status: AC
  Filled 2017-02-23: qty 1

## 2017-02-23 MED ORDER — PANTOPRAZOLE SODIUM 40 MG PO TBEC
40.0000 mg | DELAYED_RELEASE_TABLET | Freq: Every day | ORAL | Status: DC
  Administered 2017-02-23 – 2017-02-24 (×2): 40 mg via ORAL
  Filled 2017-02-23 (×2): qty 1

## 2017-02-23 MED ORDER — AMLODIPINE BESYLATE 10 MG PO TABS
10.0000 mg | ORAL_TABLET | Freq: Every day | ORAL | Status: DC
  Administered 2017-02-23 – 2017-02-24 (×2): 10 mg via ORAL
  Filled 2017-02-23 (×2): qty 1

## 2017-02-23 MED ORDER — TRAMADOL HCL 50 MG PO TABS: 50.0000 mg | ORAL_TABLET | Freq: Four times a day (QID) | ORAL | Status: DC | PRN

## 2017-02-23 SURGICAL SUPPLY — 21 items
BALLN SAPPHIRE 3.0X15 (BALLOONS) ×2
BALLN SAPPHIRE ~~LOC~~ 4.0X15 (BALLOONS) ×2 IMPLANT
BALLN SPRINT LEG OTW 1.25X10 (BALLOONS) ×2
BALLOON SAPPHIRE 3.0X15 (BALLOONS) ×1 IMPLANT
BALLOON SPRINT LEG OTW 1.25X10 (BALLOONS) ×1 IMPLANT
CATH VISTA GUIDE 6FR JR4 (CATHETERS) ×2 IMPLANT
CROWN DIAMONDBACK CLASSIC 1.25 (BURR) ×2 IMPLANT
DEVICE RAD COMP TR BAND LRG (VASCULAR PRODUCTS) ×2 IMPLANT
ELECT DEFIB PAD ADLT CADENCE (PAD) ×2 IMPLANT
GLIDESHEATH SLEND SS 6F .021 (SHEATH) ×2 IMPLANT
GUIDEWIRE INQWIRE 1.5J.035X260 (WIRE) ×1 IMPLANT
INQWIRE 1.5J .035X260CM (WIRE) ×2
KIT ENCORE 26 ADVANTAGE (KITS) ×2 IMPLANT
KIT HEART LEFT (KITS) ×2 IMPLANT
LUBRICANT VIPERSLIDE CORONARY (MISCELLANEOUS) ×2 IMPLANT
PACK CARDIAC CATHETERIZATION (CUSTOM PROCEDURE TRAY) ×2 IMPLANT
STENT SIERRA 3.50 X 23 MM (Permanent Stent) ×2 IMPLANT
TRANSDUCER W/STOPCOCK (MISCELLANEOUS) ×2 IMPLANT
TUBING CIL FLEX 10 FLL-RA (TUBING) ×2 IMPLANT
WIRE COUGAR XT STRL 300CM (WIRE) ×2 IMPLANT
WIRE VIPER ADVANCE COR .012TIP (WIRE) ×2 IMPLANT

## 2017-02-23 NOTE — H&P (View-Only) (Signed)
Patient Name: Molly Cobb Date of Encounter: 02/18/2017  Primary Cardiologist: Dr. Laqueta Linden [facility] Problem List     Principal Problem:   Chest pain Active Problems:   Benign hypertension with CKD (chronic kidney disease) stage IV (HCC)   Hypothyroidism (acquired)   Type 2 diabetes mellitus with stage 4 chronic kidney disease, without long-term current use of insulin (HCC)   SSS (sick sinus syndrome) (HCC)   Anemia   Severe aortic stenosis   S/P placement of cardiac pacemaker   Stool guaiac positive   AVM (arteriovenous malformation) of stomach, acquired with hemorrhage   Gastritis and gastroduodenitis     Subjective   No complaints. A little groggy after EGD. No chest pain or SOB.   Inpatient Medications    Scheduled Meds: . amLODipine  10 mg Oral Daily  . enoxaparin (LOVENOX) injection  30 mg Subcutaneous Q24H  . insulin aspart  0-9 Units Subcutaneous TID WC  . insulin aspart  3 Units Subcutaneous TID WC  . levothyroxine  100 mcg Oral QAC breakfast  . metoprolol tartrate  12.5 mg Oral BID  . pantoprazole  40 mg Oral Daily  . pravastatin  80 mg Oral QHS  . sodium bicarbonate  650 mg Oral QID  . sodium chloride flush  3 mL Intravenous Q12H   Continuous Infusions: . sodium chloride 250 mL (02/18/17 0745)   PRN Meds: sodium chloride, acetaminophen **OR** acetaminophen, clonazePAM, sodium chloride flush   Vital Signs    Vitals:   02/18/17 0743 02/18/17 0837 02/18/17 0840 02/18/17 0845  BP: (!) 153/39 (!) 135/44 (!) 135/44 (!) 142/33  Pulse: 72 70 70 72  Resp: 10 (!) 21 (!) 23 20  Temp: 98.2 F (36.8 C) 97.8 F (36.6 C)    TempSrc: Oral Oral    SpO2: 100% 100% 100% 98%  Weight:      Height:        Intake/Output Summary (Last 24 hours) at 02/18/17 1042 Last data filed at 02/18/17 1005  Gross per 24 hour  Intake              490 ml  Output             3000 ml  Net            -2510 ml   Filed Weights   02/16/17 0553  02/17/17 0425 02/18/17 0326  Weight: 186 lb 6.4 oz (84.6 kg) 189 lb 1.6 oz (85.8 kg) 188 lb 3.2 oz (85.4 kg)    Physical Exam   GEN: Well nourished, well developed, in no acute distress.  HEENT: Grossly normal.  Neck: Supple, no JVD, carotid bruits, or masses. Cardiac: RRR, rubs, or gallops. No clubbing, cyanosis, edema.  Radials/DP/PT 2+ and equal bilaterally. Grade 3/6 late peaking harsh systolic murmur at the right upper sternal border Respiratory:  Respirations regular and unlabored, clear to auscultation bilaterally. GI: Soft, nontender, nondistended, BS + x 4. MS: no deformity or atrophy. Skin: warm and dry, no rash. Neuro:  Strength and sensation are intact. Psych: AAOx3.  Normal affect.  Labs    CBC  Recent Labs  02/16/17 0533 02/17/17 0228  WBC 7.6 6.5  HGB 11.0* 9.4*  HCT 34.3* 29.2*  MCV 82.5 83.7  PLT 203 250   Basic Metabolic Panel  Recent Labs  02/16/17 0533 02/17/17 0228 02/18/17 0941  NA 140 139 140  K 5.2* 4.2 4.4  CL 109 112* 109  CO2 25  21* 23  GLUCOSE 140* 95 117*  BUN 48* 45* 53*  CREATININE 2.52* 2.02* 2.51*  CALCIUM 9.0 7.7* 8.8*  MG 1.9  --   --     Telemetry    A pacing - Personally Reviewed  ECG    A paced. HR 80 with nonspecific ST/TW changes  - Personally Reviewed  Radiology    No results found.  Cardiac Studies   2-D ECHO: 02/15/2017 Study Conclusions - Left ventricle: The cavity size was normal. Wall thickness was increased in a pattern of mild LVH. Systolic function was normal. The estimated ejection fraction was in the range of 55% to 60%. Wall motion was normal; there were no regional wall motion abnormalities. Doppler parameters are consistent with abnormal left ventricular relaxation (grade 1 diastolic dysfunction). - Aortic valve: Trileaflet; severely calcified leaflets. There was severe stenosis. There was mild regurgitation. Mean gradient (S): 61 mm Hg. Peak gradient (S): 93 mm Hg. Valve area  (VTI): 0.44 cm^2. - Mitral valve: Moderately calcified annulus. There was no significant regurgitation. - Left atrium: The atrium was mildly dilated. - Right ventricle: The cavity size was normal. Pacer wire or catheter noted in right ventricle. Systolic function was normal. - Right atrium: The atrium was mildly dilated. - Tricuspid valve: Peak RV-RA gradient (S): 22 mm Hg. - Pulmonary arteries: PA peak pressure: 25 mm Hg (S). - Inferior vena cava: The vessel was normal in size. The respirophasic diameter changes were in the normal range (>= 50%), consistent with normal central venous pressure. Impressions: - Normal LV size with mild LV hypertrophy. EF 55-60%. Normal RV size and systolic function. Severe aortic stenosis with mild aortic insufficiency.   Cardiac catheterization 02/16/2017: Conclusion    Prox RCA to Mid RCA lesion, 90 %stenosed.  Mid Cx lesion, 70 %stenosed.  1.heavily calcified coronary arteries with significant two-vessel coronary artery disease affecting the proximal to mid right coronary artery and distal left circumflex supplying a small OM 3 branch.  2. Right heart catheterization showed normal filling pressures, no significant pulmonary hypertension and normal cardiac output.  3. Heavily calcified aortic valve with severely restricted opening. I did not attempt to cross the valve. This is known to be severely stenotic by echo.  Recommendations: Aortic valve replacement +1 vessel CABG to the right coronary artery versus TAVR and RCA PCI ( this requires atherectomy and high risk for contrast-induced nephropathy.      Patient Profile     Molly Cobb is a 83 y.o. female with a hx of HTN, HLD, hypothyroidism, stage IV CKD, DMT2, s/p PPM, anemia, severe stage D1 aortic stenosis and single vessel CAD who presented to [facility] on 02/13/17 with angina in the setting of acute anemia.   Assessment & Plan    Severe symptomatic AS: continue with  TAVR work up. She will require a staged CT scan to evaluate vascular access and aortic annular size. Because of her chronic kidney disease we will plan to do this in the outpatient setting.  Severe single vessel CAD: LHC on 8/22 showed severe calcific stenosis of the RCA. She really has not been having much angina except that associated with her anemia. However, her RCA is a large vessel and she will require revascularization (atherectomy and stenting) if she is going to have TAVR. GI consulted for evaluation given that she will need DAPT after PCI. EGD today showed an AVM s/p APC and she was cleared for PCI with DAPT. Will have this set up for next Wednesday  with Dr. Burt Knack. She will need pre hydration prior to procedure. Will start plavix 75mg  daily today which should be continued at discharge.   Anemia: FOBT positive. GI on board. EGD this morning showed an actively oozing AVM s/p APC and mild pan gastritis. Dr Ardis Hughs cleared started her on a PPI, tested for H. Pylori and cleared her for PCI and TAVR with appropriate antiplatelet therapy. Hg 11--> 9.4. Will check CBC today  Stage IV CKD: renal function has remained stable. Creat 2.51 and GFR 19.   Signed, Angelena Form, PA-C  02/18/2017, 10:42 AM   Patient seen, examined. Available data reviewed. Agree with findings, assessment, and plan as outlined by Nell Range, PA-C. On my exam today the patient is alert and oriented, in no distress. Lungs are clear. Heart is regular rate and rhythm with a grade 3/6 harsh late peaking systolic murmur at the right upper sternal border. Abdomen is soft and nontender. Extremities show no edema.  The patient underwent EGD this morning. Those results are reviewed. She underwent treatment for an AVM. She will be continued on a proton pump inhibitor and H. pylori testing has been sent off. She is cleared to start clopidogrel tomorrow. I think she is stable for hospital discharge from a cardiac perspective. We have  arranged for her to come back next Wednesday for atherectomy and stenting of the right coronary artery. Will recheck her labs that day to make sure that her creatinine and hemoglobin are stable. I have reviewed our plan in detail with the patient and will return to talk with her when her daughter arrives at the hospital. Orders are written for PCI next Wednesday and instructions will be given to the patient. After her PCI is completed, she will continue to undergo CT angiography and further outpatient pre-TAVR studies.  [name], M.D. 02/18/2017 1:08 PM

## 2017-02-23 NOTE — Care Management Note (Signed)
Case Management Note  Patient Details  Name: Molly Cobb MRN: [id number] Date of Birth: 1931/08/13  Subjective/Objective:   From home, s/p coronary athrectomy, will be on plavix.  She is active with [facility] for HHPT, confirmed with Butch Penny with [facility].                   Action/Plan: NCM will follow for dc needs.   Expected Discharge Date:                  Expected Discharge Plan:  [place]  In-House Referral:     Discharge planning Services  CM Consult  Post Acute Care Choice:  Resumption of Svcs/PTA Provider Choice offered to:     DME Arranged:    DME Agency:     HH Arranged:  PT [place]:  [place]  Status of Service:  In process, will continue to follow  If discussed at Long Length of Stay Meetings, dates discussed:    Additional Comments:  Zenon Mayo, RN 02/23/2017, 2:44 PM

## 2017-02-23 NOTE — Discharge Summary (Signed)
Discharge Summary    Patient ID: Molly Cobb,  MRN: [id number], DOB/AGE: 81-Nov-1933 81 y.o.  Admit date: 02/23/2017 Discharge date: 02/24/2017  Primary Care Provider: Guadlupe Spanish Primary Cardiologist: [facility], [place]) Burt Knack  Discharge Diagnoses    Active Problems:   Coronary artery disease with exertional angina [facility])   Allergies Allergies  Allergen Reactions  . Sulfa Antibiotics Nausea And Vomiting    Diagnostic Studies/Procedures    LHC: 02/23/17  Conclusion   Successful PCI of severe stenosis in the mid right coronary artery using orbital atherectomy with drug-eluting stent implantation (3.5 x 23 mm Abbott Sierra DES)  _____________   History of Present Illness     Molly Cobb a 83 y.o.femalewith a hx of HTN, HLD, hypothyroidism,stage IV CKD, DMT2, s/p PPM, anemia, severe stage D1 aortic stenosis and single vessel CAD who presented to [facility] on 02/13/17 with angina in the setting of acute anemia. Noted to have murmur on physical exam with echo showing severe aortic stenosis with mean transaortic gradient of 61 mmHg. She hadn't been too symptomatic until recently when she has had significant anemia. She admitted to recent episodes of DOE and chest discomfort with physical exertion. Underwent R/LHC showing severe calcific stenosis of the RCA. Was evaluated by Dr. Roxy Manns and felt to be a good candidate for TAVR. Underwent GI work up as she had a positive FOBT and was cleared to start antiplatelet therapy. Was discharged home from Internal Medicine and presented back for outpatient cath.   Hospital Course     Underwent LHC with Dr. Burt Knack noted above with successful PCI/DES x1 to RCA. Post cath labs showed Cr 2.26 and Hgb 9.2. No complications noted post cath. Worked with cardiac rehab. Will be continued on DAPT with ASA/plavix. Home pravastatin was switched to Crestor 40mg  daily.   She was seen by Dr. Claiborne Billings and determined stable for discharge home. Follow up in the  office has been arranged. Medications are listed below. The office will call to arrange follow up in regards to TAVR work up.   General: Well developed, well nourished, female appearing in no acute distress. Head: Normocephalic, atraumatic.  Neck: Supple without bruits, JVD. Lungs:  Resp regular and unlabored, CTA. Heart: RRR, S1, S2, no S3, S4, 4/6 systolic murmur; no rub. Abdomen: Soft, non-tender, non-distended with normoactive bowel sounds. No hepatomegaly. No rebound/guarding. No obvious abdominal masses. Extremities: No clubbing, cyanosis, edema. Distal pedal pulses are 2+ bilaterally. R radial cath site stable without bruising or hematoma Neuro: Alert and oriented X 3. Moves all extremities spontaneously. Psych: Normal affect.   _____________  Discharge Vitals Blood pressure (!) 145/52, pulse 71, temperature 98.1 F (36.7 C), temperature source Oral, resp. rate 12, height 5\' 3"  (1.6 m), weight 176 lb 5.9 oz (80 kg), SpO2 100 %.  Filed Weights   02/23/17 0613 02/24/17 0305  Weight: 180 lb (81.6 kg) 176 lb 5.9 oz (80 kg)    Labs & Radiologic Studies    CBC  Recent Labs  02/23/17 0701 02/24/17 0401  WBC 6.9 5.9  HGB 9.8* 9.2*  HCT 31.2* 29.2*  MCV 83.9 83.7  PLT 191 160   Basic Metabolic Panel  Recent Labs  02/23/17 0701 02/24/17 0401  NA 138 141  K 4.8 4.7  CL 107 113*  CO2 22 21*  GLUCOSE 115* 107*  BUN 43* 37*  CREATININE 2.47* 2.26*  CALCIUM 8.7* 8.5*   Liver Function Tests  Recent Labs  02/23/17 0701  AST  34  ALT 29  ALKPHOS 93  BILITOT 0.5  PROT 6.7  ALBUMIN 3.5   No results for input(s): LIPASE, AMYLASE in the last 72 hours. Cardiac Enzymes No results for input(s): CKTOTAL, CKMB, CKMBINDEX, TROPONINI in the last 72 hours. BNP Invalid input(s): POCBNP D-Dimer No results for input(s): DDIMER in the last 72 hours. Hemoglobin A1C No results for input(s): HGBA1C in the last 72 hours. Fasting Lipid Panel No results for input(s): CHOL, HDL,  LDLCALC, TRIG, CHOLHDL, LDLDIRECT in the last 72 hours. Thyroid Function Tests No results for input(s): TSH, T4TOTAL, T3FREE, THYROIDAB in the last 72 hours.  Invalid input(s): FREET3 _____________  Dg Chest 2 View  Result Date: 02/12/2017 CLINICAL DATA:  Shortness of breath EXAM: CHEST  2 VIEW COMPARISON:  11/30/2015 FINDINGS: Left-sided multi lead pacing device as before. Streaky bibasilar atelectasis. No acute consolidation or pleural effusion. Borderline cardiomegaly. Aortic atherosclerosis. No pneumothorax. Degenerative changes of the spine. IMPRESSION: Minimal basilar atelectasis and borderline cardiomegaly. Negative for acute infiltrate or edema. Electronically Signed   By: Donavan Foil M.D.   On: 02/12/2017 19:48   Nm Myocar Multi W/spect W/wall Motion / Ef  Result Date: 02/14/2017  There was no ST segment deviation noted during stress.  Defect 1: There is a medium defect of moderate severity present in the basal inferior, basal inferolateral, mid inferior, mid inferolateral and apical inferior location.  Findings consistent with prior myocardial infarction.  This is a low risk study.  The left ventricular ejection fraction is mildly decreased (45-54%).  Nuclear stress EF: 48%.  Low risk stress nuclear study with inferior scar with minimal peri-infarct ischemia and mildly reduced left ventricular global systolic function.   Nm Pulmonary Perf And Vent  Result Date: 02/13/2017 CLINICAL DATA:  Substernal chest pain radiating to the back. Shortness of breath. EXAM: NUCLEAR MEDICINE VENTILATION - PERFUSION LUNG SCAN TECHNIQUE: Ventilation images were obtained in multiple projections using inhaled aerosol Tc-26m DTPA. Perfusion images were obtained in multiple projections after intravenous injection of Tc-5m MAA. RADIOPHARMACEUTICALS:  32.2 mCi Technetium-44m DTPA aerosol inhalation and 4.24 mCi Technetium-56m MAA IV COMPARISON:  None. FINDINGS: Ventilation: No focal ventilation defect.  Perfusion: No wedge shaped peripheral perfusion defects to suggest acute pulmonary embolism. IMPRESSION: No scintigraphic evidence of pulmonary embolus. Electronically Signed   By: Kathreen Devoid   On: 02/13/2017 10:55   Disposition   Pt is being discharged home today in good condition.  Follow-up Plans & Appointments    Follow-up Information    Liliane Shi, PA-C Follow up on 03/08/2017.   Specialties:  Cardiology, Physician Assistant Why:  at 8:45am for your follow up appt.  Contact information: [address] [place] [address] [phone]          Discharge Instructions    Call MD for:  redness, tenderness, or signs of infection (pain, swelling, redness, odor or green/yellow discharge around incision site)    Complete by:  As directed    Diet - low sodium heart healthy    Complete by:  As directed    Discharge instructions    Complete by:  As directed    Radial Site Care Refer to this sheet in the next few weeks. These instructions provide you with information on caring for yourself after your procedure. Your caregiver may also give you more specific instructions. Your treatment has been planned according to current medical practices, but problems sometimes occur. Call your caregiver if you have any problems or questions  after your procedure. HOME CARE INSTRUCTIONS You may shower the day after the procedure.Remove the bandage (dressing) and gently wash the site with plain soap and water.Gently pat the site dry.  Do not apply powder or lotion to the site.  Do not submerge the affected site in water for 3 to 5 days.  Inspect the site at least twice daily.  Do not flex or bend the affected arm for 24 hours.  No lifting over 5 pounds (2.3 kg) for 5 days after your procedure.  Do not drive home if you are discharged the same day of the procedure. Have someone else drive you.  You may drive 24 hours after the procedure unless otherwise instructed by your  caregiver.  What to expect: Any bruising will usually fade within 1 to 2 weeks.  Blood that collects in the tissue (hematoma) may be painful to the touch. It should usually decrease in size and tenderness within 1 to 2 weeks.  SEEK IMMEDIATE MEDICAL CARE IF: You have unusual pain at the radial site.  You have redness, warmth, swelling, or pain at the radial site.  You have drainage (other than a small amount of blood on the dressing).  You have chills.  You have a fever or persistent symptoms for more than 72 hours.  You have a fever and your symptoms suddenly get worse.  Your arm becomes pale, cool, tingly, or numb.  You have heavy bleeding from the site. Hold pressure on the site.   PLEASE DO NOT MISS ANY DOSES OF YOUR PLAVIX!!!!! Also keep a log of you blood pressures and bring back to your follow up appt. Please call the office with any questions.   Patients taking blood thinners should generally stay away from medicines like ibuprofen, Advil, Motrin, naproxen, and Aleve due to risk of stomach bleeding. You may take Tylenol as directed or talk to your primary doctor about alternatives.   Increase activity slowly    Complete by:  As directed       Discharge Medications   Current Discharge Medication List    START taking these medications   Details  aspirin 81 MG chewable tablet Chew 1 tablet (81 mg total) by mouth daily. Qty: 30 tablet, Refills: 0    nitroGLYCERIN (NITROSTAT) 0.4 MG SL tablet Place 1 tablet (0.4 mg total) under the tongue every 5 (five) minutes as needed. Qty: 25 tablet, Refills: 2    rosuvastatin (CRESTOR) 20 MG tablet Take 2 tablets (40 mg total) by mouth at bedtime. Qty: 60 tablet, Refills: 3      CONTINUE these medications which have CHANGED   Details  clopidogrel (PLAVIX) 75 MG tablet Take 1 tablet (75 mg total) by mouth daily. Qty: 90 tablet, Refills: 1    metoprolol tartrate (LOPRESSOR) 25 MG tablet Take 0.5 tablets (12.5 mg total) by mouth 2  (two) times daily. Qty: 90 tablet, Refills: 0      CONTINUE these medications which have NOT CHANGED   Details  amLODipine (NORVASC) 10 MG tablet Take 10 mg by mouth daily.    levothyroxine (SYNTHROID, LEVOTHROID) 100 MCG tablet Take 100 mcg by mouth daily before breakfast.    pantoprazole (PROTONIX) 40 MG tablet Take 1 tablet (40 mg total) by mouth daily. Qty: 30 tablet, Refills: 0    sodium bicarbonate 650 MG tablet Take 650 mg by mouth 2 (two) times daily.     traMADol (ULTRAM) 50 MG tablet Take 1 tablet (50 mg total) by mouth every  6 (six) hours as needed. Qty: 15 tablet, Refills: 0      STOP taking these medications     pravastatin (PRAVACHOL) 80 MG tablet      diclofenac sodium (VOLTAREN) 1 % GEL      lidocaine (LIDODERM) 5 %          Aspirin prescribed at discharge?  Yes High Intensity Statin Prescribed? (Lipitor 40-80mg  or Crestor 20-40mg ): Yes Beta Blocker Prescribed? Yes For EF <40%, was ACEI/ARB Prescribed? No: CKD ADP Receptor Inhibitor Prescribed? (i.e. Plavix etc.-Includes Medically Managed Patients): Yes For EF <40%, Aldosterone Inhibitor Prescribed? No: EF ok Was EF assessed during THIS hospitalization? No: Done during last admission Was Cardiac Rehab II ordered? (Included Medically managed Patients): Yes   Outstanding Labs/Studies   FLP/LFTs in 6 weeks if tolerating statin change.   Duration of Discharge Encounter   Greater than 30 minutes including physician time.  Signed, Reino Bellis NP-C 02/24/2017, 9:47 AM    Patient seen and examined. Agree with assessment and plan. Feels well s/p orbital atherectomy/PCI of RCA yesterday, will plan for future TAVR.  R radial site stable. No chest pain or dyspnea.  Will change pravastatin to crestor for high potency statin therapy.   Troy Sine, MD, [facility] 02/24/2017 9:47 AM

## 2017-02-23 NOTE — Interval H&P Note (Signed)
History and Physical Interval Note:  02/23/2017 11:32 AM  Molly Cobb  has presented today for surgery, with the diagnosis of cad  The various methods of treatment have been discussed with the patient and family. After consideration of risks, benefits and other options for treatment, the patient has consented to  Procedure(s): CORONARY ATHERECTOMY (N/A) as a surgical intervention .  The patient's history has been reviewed, patient examined, no change in status, stable for surgery.  I have reviewed the patient's chart and labs.  Questions were answered to the patient's satisfaction.    Patient returns for atherectomy and PCI the right coronary artery. She has had no anginal symptoms since her discharge from the hospital. She is tolerating aspirin and clopidogrel. Risks, indications, and alternatives to the procedure have been reviewed with the patient. Plan for orbital atherectomy and stent implantation from a right radial artery approach.  [name]

## 2017-02-24 ENCOUNTER — Encounter (HOSPITAL_COMMUNITY): Payer: Self-pay | Admitting: Cardiovascular Disease

## 2017-02-24 ENCOUNTER — Other Ambulatory Visit: Payer: Self-pay

## 2017-02-24 ENCOUNTER — Telehealth: Payer: Self-pay | Admitting: Cardiovascular Disease

## 2017-02-24 ENCOUNTER — Other Ambulatory Visit: Payer: Self-pay | Admitting: *Deleted

## 2017-02-24 DIAGNOSIS — I25119 Atherosclerotic heart disease of native coronary artery with unspecified angina pectoris: Secondary | ICD-10-CM | POA: Diagnosis not present

## 2017-02-24 DIAGNOSIS — I350 Nonrheumatic aortic (valve) stenosis: Secondary | ICD-10-CM

## 2017-02-24 DIAGNOSIS — N184 Chronic kidney disease, stage 4 (severe): Secondary | ICD-10-CM | POA: Diagnosis not present

## 2017-02-24 DIAGNOSIS — I129 Hypertensive chronic kidney disease with stage 1 through stage 4 chronic kidney disease, or unspecified chronic kidney disease: Secondary | ICD-10-CM | POA: Diagnosis not present

## 2017-02-24 DIAGNOSIS — I25118 Atherosclerotic heart disease of native coronary artery with other forms of angina pectoris: Secondary | ICD-10-CM | POA: Diagnosis not present

## 2017-02-24 DIAGNOSIS — N289 Disorder of kidney and ureter, unspecified: Secondary | ICD-10-CM

## 2017-02-24 LAB — BASIC METABOLIC PANEL
Anion gap: 7 (ref 5–15)
BUN: 37 mg/dL — AB (ref 6–20)
CHLORIDE: 113 mmol/L — AB (ref 101–111)
CO2: 21 mmol/L — ABNORMAL LOW (ref 22–32)
CREATININE: 2.26 mg/dL — AB (ref 0.44–1.00)
Calcium: 8.5 mg/dL — ABNORMAL LOW (ref 8.9–10.3)
GFR calc Af Amer: 22 mL/min — ABNORMAL LOW (ref >60)
GFR, EST NON AFRICAN AMERICAN: 19 mL/min — AB (ref >60)
Glucose, Bld: 107 mg/dL — ABNORMAL HIGH (ref 65–99)
Potassium: 4.7 mmol/L (ref 3.5–5.1)
SODIUM: 141 mmol/L (ref 135–145)

## 2017-02-24 LAB — CBC
HCT: 29.2 % — ABNORMAL LOW (ref 36.0–46.0)
Hemoglobin: 9.2 g/dL — ABNORMAL LOW (ref 12.0–15.0)
MCH: 26.4 pg (ref 26.0–34.0)
MCHC: 31.5 g/dL (ref 30.0–36.0)
MCV: 83.7 fL (ref 78.0–100.0)
PLATELETS: 183 10*3/uL (ref 150–400)
RBC: 3.49 MIL/uL — ABNORMAL LOW (ref 3.87–5.11)
RDW: 14.7 % (ref 11.5–15.5)
WBC: 5.9 10*3/uL (ref 4.0–10.5)

## 2017-02-24 LAB — GLUCOSE, CAPILLARY: Glucose-Capillary: 106 mg/dL — ABNORMAL HIGH (ref 65–99)

## 2017-02-24 MED ORDER — ASPIRIN 81 MG PO CHEW: 81.0000 mg | CHEWABLE_TABLET | Freq: Every day | ORAL | 0 refills | Status: AC

## 2017-02-24 MED ORDER — ROSUVASTATIN CALCIUM 20 MG PO TABS: 40.0000 mg | ORAL_TABLET | Freq: Every day | ORAL | 3 refills | Status: DC

## 2017-02-24 MED ORDER — METOPROLOL TARTRATE 25 MG PO TABS: 12.5000 mg | ORAL_TABLET | Freq: Two times a day (BID) | ORAL | 0 refills | Status: DC

## 2017-02-24 MED ORDER — NITROGLYCERIN 0.4 MG SL SUBL: 0.4000 mg | SUBLINGUAL_TABLET | SUBLINGUAL | 2 refills | Status: DC | PRN

## 2017-02-24 MED ORDER — CLOPIDOGREL BISULFATE 75 MG PO TABS: 75.0000 mg | ORAL_TABLET | Freq: Every day | ORAL | 1 refills | Status: DC

## 2017-02-24 MED FILL — Nitroglycerin IV Soln 100 MCG/ML in D5W: INTRA_ARTERIAL | Qty: 10 | Status: AC

## 2017-02-24 NOTE — Telephone Encounter (Signed)
Follow up     Toc moved to 03/23/17 345p scott weaver

## 2017-02-24 NOTE — Progress Notes (Signed)
CARDIAC REHAB PHASE I   PRE:  Rate/Rhythm: 73 pacing    BP: sitting 138/61    SaO2:   MODE:  Ambulation: 450 ft   POST:  Rate/Rhythm: 111 pacing    BP: sitting 161/63     SaO2:   Pt steady walking. Sts she feels well, feels like she could pick up a building.  She did feel SOB "whispy" after 400 ft. Stopped and rested for a few seconds then felt better. Ed completed with pt. Understands importance of Plavix/ASA. Encouraged her to walk as tolerated but listen for sx of her AS. Will wait on CRPII until after TAVR.  [place], ACSM 02/24/2017 8:44 AM

## 2017-02-24 NOTE — Telephone Encounter (Signed)
I spoke with the pt and pt's daughter and made them aware that CT scans are needed for TAVR work-up.  CTs have been arranged on 03/07/17 at 1:30.  The pt will need Bicarb and should arrive at [place] [facility] at 12:00 noon.  2nd Surgeon evaluation is scheduled on 03/16/17 at 4:00 with Dr Cyndia Bent.  I will mail pre-test instructions to the pt's home address.  I advised that they contact the office with any additional questions or concerns.   Letter with instructions have been mailed to the pt.

## 2017-02-24 NOTE — Telephone Encounter (Signed)
New Message     Does pt absolutely need to have the CT testing done? Daughter would like to know

## 2017-02-24 NOTE — Telephone Encounter (Signed)
New message     Has TOC appt with Richardson Dopp 845 03/08/17

## 2017-02-25 ENCOUNTER — Telehealth: Payer: Self-pay | Admitting: Cardiovascular Disease

## 2017-02-25 NOTE — Telephone Encounter (Signed)
Will send request to Reino Bellis NP who put in original ordered.

## 2017-02-25 NOTE — Telephone Encounter (Signed)
New message       Pt c/o medication issue:  1. Name of Medication:  Generic crestor 2. How are you currently taking this medication (dosage and times per day)? 20mg --2 tab at bedtime 3. Are you having a reaction (difficulty breathing--STAT)?  4. What is your medication issue?  Medicare will not pay for 20mg  tablets.  Can you call in a 40mg  tablet to [facility] in [place]?

## 2017-03-01 ENCOUNTER — Other Ambulatory Visit: Payer: Self-pay | Admitting: Cardiology

## 2017-03-01 MED ORDER — ROSUVASTATIN CALCIUM 40 MG PO TABS: 40.0000 mg | ORAL_TABLET | Freq: Every day | ORAL | 1 refills | Status: DC

## 2017-03-01 NOTE — Telephone Encounter (Signed)
**Note De-Identified Daryon Remmert Obfuscation** Patient contacted regarding discharge from [facility] on 02/23/17.  Patient understands to follow up with provider Richardson Dopp, PA-c on 03/23/17 at 3:45 at [facility] in [place]. Patient understands discharge instructions? Yes Patient understands medications and regiment? Yes Patient understands to bring all medications to this visit? Yes  The pt states that she is doing well and she has no complaints at this time. She has [facility]'s phone number to call if she has questions or concerns.

## 2017-03-07 ENCOUNTER — Ambulatory Visit (HOSPITAL_COMMUNITY)
Admission: RE | Admit: 2017-03-07 | Discharge: 2017-03-07 | Disposition: A | Discharge status: Home / Self Care | Payer: Medicare Other | Source: Ambulatory Visit | Attending: Cardiovascular Disease | Admitting: Cardiovascular Disease

## 2017-03-07 ENCOUNTER — Other Ambulatory Visit: Payer: Self-pay | Admitting: Cardiovascular Disease

## 2017-03-07 ENCOUNTER — Encounter (HOSPITAL_COMMUNITY): Payer: Self-pay | Admitting: General Surgery

## 2017-03-07 DIAGNOSIS — I700 Atherosclerosis of aorta: Secondary | ICD-10-CM | POA: Diagnosis not present

## 2017-03-07 DIAGNOSIS — I350 Nonrheumatic aortic (valve) stenosis: Secondary | ICD-10-CM | POA: Diagnosis not present

## 2017-03-07 DIAGNOSIS — N289 Disorder of kidney and ureter, unspecified: Secondary | ICD-10-CM | POA: Diagnosis present

## 2017-03-07 DIAGNOSIS — I878 Other specified disorders of veins: Secondary | ICD-10-CM | POA: Diagnosis not present

## 2017-03-07 DIAGNOSIS — I2510 Atherosclerotic heart disease of native coronary artery without angina pectoris: Secondary | ICD-10-CM | POA: Insufficient documentation

## 2017-03-07 HISTORY — PX: IR US GUIDE VASC ACCESS RIGHT: IMG2390

## 2017-03-07 HISTORY — PX: IR RADIOLOGY PERIPHERAL GUIDED IV START: IMG5598

## 2017-03-07 MED ORDER — METOPROLOL TARTRATE 5 MG/5ML IV SOLN
INTRAVENOUS | Status: AC
  Filled 2017-03-07: qty 10

## 2017-03-07 MED ORDER — SODIUM BICARBONATE 8.4 % IV SOLN
INTRAVENOUS | Status: AC
  Administered 2017-03-07: 16:00:00 via INTRAVENOUS
  Filled 2017-03-07: qty 500

## 2017-03-07 MED ORDER — METOPROLOL TARTRATE 5 MG/5ML IV SOLN
INTRAVENOUS | Status: AC
  Filled 2017-03-07: qty 5

## 2017-03-07 MED ORDER — METOPROLOL TARTRATE 5 MG/5ML IV SOLN
5.0000 mg | Freq: Once | INTRAVENOUS | Status: AC
  Administered 2017-03-07: 5 mg via INTRAVENOUS
  Filled 2017-03-07: qty 5

## 2017-03-07 MED ORDER — METOPROLOL TARTRATE 5 MG/5ML IV SOLN
5.0000 mg | INTRAVENOUS | Status: DC | PRN
  Administered 2017-03-07 (×2): 5 mg via INTRAVENOUS
  Filled 2017-03-07 (×2): qty 5

## 2017-03-07 MED ORDER — LIDOCAINE HCL (PF) 1 % IJ SOLN
INTRAMUSCULAR | Status: AC
  Filled 2017-03-07: qty 30

## 2017-03-07 MED ORDER — IOPAMIDOL (ISOVUE-370) INJECTION 76%
INTRAVENOUS | Status: AC
  Administered 2017-03-07: 100 mL
  Filled 2017-03-07: qty 100

## 2017-03-07 MED ORDER — SODIUM BICARBONATE BOLUS VIA INFUSION
INTRAVENOUS | Status: AC
Start: 2017-03-07 — End: 2017-03-07
  Administered 2017-03-07: 250 meq via INTRAVENOUS
  Filled 2017-03-07: qty 1

## 2017-03-07 NOTE — Progress Notes (Signed)
Pt arrived from CT scan with bicarb drip infusing for ordered 2 hours post drip. VSS

## 2017-03-08 ENCOUNTER — Ambulatory Visit: Payer: Medicare Other | Admitting: Physician Assistant

## 2017-03-16 ENCOUNTER — Institutional Professional Consult (permissible substitution) (INDEPENDENT_AMBULATORY_CARE_PROVIDER_SITE_OTHER): Payer: Medicare Other | Admitting: Surgery

## 2017-03-16 ENCOUNTER — Encounter: Payer: Self-pay | Admitting: Surgery

## 2017-03-16 VITALS — BP 132/76 | HR 82 | Resp 16 | Ht 63.0 in | Wt 187.0 lb

## 2017-03-16 DIAGNOSIS — I350 Nonrheumatic aortic (valve) stenosis: Secondary | ICD-10-CM

## 2017-03-17 ENCOUNTER — Telehealth: Payer: Self-pay | Admitting: Gastroenterology

## 2017-03-17 NOTE — Telephone Encounter (Signed)
Patient states she got a letter to call nurse Patty to discuss path results from endo at hosp on 8.24.18.

## 2017-03-17 NOTE — Telephone Encounter (Signed)
Notes recorded by Milus Banister, MD on 02/24/2017 at 8:50 AM EDT   Please call the patient. The biopsies from stomach show no sign of infection, cancer. She needs next available ROV with me to see how she's done after her cardiac treatment. Would like cbc a couple days prior to that appt. Thanks

## 2017-03-18 ENCOUNTER — Other Ambulatory Visit: Payer: Self-pay

## 2017-03-18 DIAGNOSIS — Q2730 Arteriovenous malformation, site unspecified: Secondary | ICD-10-CM

## 2017-03-18 NOTE — Telephone Encounter (Signed)
Pt aware and will keep appt for f/u and labs as scheduled.

## 2017-03-18 NOTE — Progress Notes (Signed)
The pt has been advised and will keep f/u and labs as scheduled.

## 2017-03-21 ENCOUNTER — Encounter: Payer: Self-pay | Admitting: Surgery

## 2017-03-21 NOTE — Progress Notes (Signed)
Patient ID: Molly Cobb, female   DOB: Apr 13, 1932, 83 y.o.   MRN: [id number]   [place] SURGERY CONSULTATION REPORT  Referring Provider is [name], MD PCP is Guadlupe Spanish, MD  Chief Complaint  Patient presents with  . Aortic Stenosis    for 2nd TAVR, review studies and schedule surgery    HPI:  The patient is an 83 year old woman with hypertension, hyperlipidemia, hypothyroidism, type 2 DM, stage IV CKD, CAD s/p PCI/DES to the mid RCA in 01/2017, s/p PPM, and anemia who had been followed by Dr. Lewayne Bunting in [facility]. She was in her usual state of health until 02/13/2017 when she presented with new onset chest pain while going up 3 flights of stairs. It was associated with shortness of breath and lightheadedness. It resolved after a few minutes but returned and she came to the [facility] ED. She ruled out for MI. Her Hgb was 7.9 and was 11.8 in May. She was transfused 2 units and had heme positive stool. She had a Lexiscan Myoview on 8/20 which was low risk with inferior scar and minimal peri-infarct ischemia. LVEF was 48%. A 2D echo showed an LVEF of 55-60% with severe AS with a mean gradient of 61 mm Hg and peak of 93 mm Hg. She underwent cath on 8/22 showing 90% mid RCA stenosis and a 70% distal LCX stenosis supplying a small OM3. EGD on 8/24 showed a single small, oozing AVM treated with APC. She then underwent PCI with orbital atherectomy and DES of the RCA on 02/23/2017. Since discharge she has been feeling ok with no significant SOB or chest discomfort but she is not doing much.  Past Medical History:  Diagnosis Date  . CKD (chronic kidney disease)    a. followed by Dr. Neta Ehlers at [facility]   . Coronary artery disease    02/23/17 PCI/DES to mRCA,  normal EF  . Depression   . Diabetes mellitus ([place])   . Gout   . Hypertension   . Presence of permanent cardiac pacemaker    a. followed by [facility]  . Severe  aortic stenosis    a. diagnosed 01/2017 during admission for chest pain.   . Thyroid disease     Past Surgical History:  Procedure Laterality Date  . CORONARY ATHERECTOMY N/A 02/23/2017   Procedure: CORONARY ATHERECTOMY;  Surgeon: [name], MD;  Location: [place] [facility];  Service: Cardiovascular;  Laterality: N/A;  . ESOPHAGOGASTRODUODENOSCOPY N/A 02/18/2017   Procedure: ESOPHAGOGASTRODUODENOSCOPY (EGD);  Surgeon: Milus Banister, MD;  Location: [facility] ENDOSCOPY;  Service: Endoscopy;  Laterality: N/A;  . FEMUR FRACTURE SURGERY    . HOT HEMOSTASIS N/A 02/18/2017   Procedure: HOT HEMOSTASIS (ARGON PLASMA COAGULATION/BICAP);  Surgeon: Milus Banister, MD;  Location: [facility] ENDOSCOPY;  Service: Endoscopy;  Laterality: N/A;  . IR RADIOLOGY PERIPHERAL GUIDED IV START  03/07/2017  . IR US GUIDE VASC ACCESS RIGHT  03/07/2017  . RIGHT/LEFT HEART CATH AND CORONARY ANGIOGRAPHY N/A 02/16/2017   Procedure: RIGHT/LEFT HEART CATH AND CORONARY ANGIOGRAPHY;  Surgeon: Wellington Hampshire, MD;  Location: [place] CV LAB;  Service: Cardiovascular;  Laterality: N/A;  . ROTATOR CUFF REPAIR      Family History  Problem Relation Age of Onset  . Heart disease Neg Hx   . Stroke Neg Hx   . Diabetes Neg Hx     Social History   Social History  . Marital status:  Widowed    Spouse name: N/A  . Number of children: N/A  . Years of education: N/A   Occupational History  . Not on file.   Social History Main Topics  . Smoking status: Never Smoker  . Smokeless tobacco: Never Used  . Alcohol use No  . Drug use: No  . Sexual activity: Not on file   Other Topics Concern  . Not on file   Social History Narrative  . No narrative on file    Current Outpatient Prescriptions  Medication Sig Dispense Refill  . amLODipine (NORVASC) 10 MG tablet Take 10 mg by mouth daily.    Marland Kitchen aspirin 81 MG chewable tablet Chew 1 tablet (81 mg total) by mouth daily. 30 tablet 0  . clopidogrel (PLAVIX) 75 MG tablet Take 1  tablet (75 mg total) by mouth daily. 90 tablet 1  . levothyroxine (SYNTHROID, LEVOTHROID) 100 MCG tablet Take 100 mcg by mouth daily before breakfast.    . metoprolol tartrate (LOPRESSOR) 25 MG tablet Take 0.5 tablets (12.5 mg total) by mouth 2 (two) times daily. 90 tablet 0  . nitroGLYCERIN (NITROSTAT) 0.4 MG SL tablet Place 1 tablet (0.4 mg total) under the tongue every 5 (five) minutes as needed. 25 tablet 2  . pantoprazole (PROTONIX) 40 MG tablet Take 1 tablet (40 mg total) by mouth daily. 30 tablet 0  . rosuvastatin (CRESTOR) 40 MG tablet Take 1 tablet (40 mg total) by mouth at bedtime. 90 tablet 1  . sodium bicarbonate 650 MG tablet Take 650 mg by mouth 2 (two) times daily.      No current facility-administered medications for this visit.     Allergies  Allergen Reactions  . Sulfa Antibiotics Nausea And Vomiting      Review of Systems:   General:  normal appetite, decreased energy, some weight gain, no weight loss, no fever  Cardiac:  no chest pain with exertion, no chest pain at rest, no SOB with  exertion, no resting SOB, no PND, no orthopnea, no palpitations, no arrhythmia, no atrial fibrillation, mild LE edema, no dizzy spells, no syncope  Respiratory:  no shortness of breath, no home oxygen, no productive cough, has dry cough, no bronchitis, no wheezing, no hemoptysis, no asthma, no pain with inspiration or cough, no sleep apnea, no CPAP at night  GI:   no difficulty swallowing, no reflux, no frequent heartburn, no hiatal hernia, no abdominal pain, no constipation, no diarrhea, no hematochezia, no hematemesis, no melena  GU:   no dysuria,  no frequency, no urinary tract infection, no hematuria,  no kidney stones, no kidney disease  Vascular:  no pain suggestive of claudication, no pain in feet, no leg cramps, no varicose veins, no DVT, no non-healing foot ulcer  Neuro:   no stroke, no TIA's, no seizures, no headaches, no temporary blindness one eye,  no slurred speech, no  peripheral neuropathy, no chronic pain, no instability of gait, no memory/cognitive dysfunction  Musculoskeletal: no arthritis, no joint swelling, no myalgias, no difficulty walking, normal mobility   Skin:   no rash, no itching, no skin infections, no pressure sores or ulcerations  Psych:   no anxiety, no depression, no nervousness, no unusual recent stress  Eyes:   no blurry vision, no floaters, no recent vision changes,  wears glasses or contacts  ENT:   no hearing loss, no loose or painful teeth, complete dentures   Hematologic:  no easy bruising, no abnormal bleeding, no clotting disorder, no frequent  epistaxis  Endocrine:  has diabetes, does  check CBG's at home        Physical Exam:   BP 132/76 (BP Location: Right Arm, Patient Position: Sitting, Cuff Size: Large)   Pulse 82   Resp 16   Ht 5\' 3"  (1.6 m)   Wt 187 lb (84.8 kg)   SpO2 99% Comment: ON RA  BMI 33.13 kg/m   General:  Obese,  well-appearing  HEENT:  Unremarkable, NCAT, PERLA, EOMI, oropharynx clear  Neck:   no JVD, no bruits, no adenopathy or thyromegaly  Chest:   clear to auscultation, symmetrical breath sounds, no wheezes, no rhonchi   CV:   RRR, grade lll/VI crescendo/decrescendo murmur heard best at RSB,  no diastolic murmur  Abdomen:  soft, non-tender, no masses or organomegaly  Extremities:  warm, well-perfused, pulses diminished, no LE edema  Rectal/GU  Deferred  Neuro:   Grossly non-focal and symmetrical throughout  Skin:   Clean and dry, no rashes, no breakdown   Diagnostic Tests:            *Leonore*                   *[facility]*                         [address],  [address]                            [phone]  ------------------------------------------------------------------- Transthoracic Echocardiography  Patient:    Abi, Shoults MR #:       [id number] Study Date: 02/15/2017 Gender:     F Age:        9 Height:     160  cm Weight:     85.4 kg BSA:        1.98 m^2 Pt. Status: Room:       [place]  Tresa Res, RDCS  ATTENDING    Nita Sells [id number]  Delano Metz, Dayna N  REFERRING    Dunn, Dayna N  PERFORMING   [facility], Inpatient  ADMITTING    Myrene Buddy P  cc:  ------------------------------------------------------------------- LV EF: 55% -   60%  ------------------------------------------------------------------- Indications:      Murmur 785.2.  ------------------------------------------------------------------- History:   Risk factors:  Hypertension. Diabetes mellitus.  ------------------------------------------------------------------- Study Conclusions  - Left ventricle: The cavity size was normal. Wall thickness was   increased in a pattern of mild LVH. Systolic function was normal.   The estimated ejection fraction was in the range of 55% to 60%.   Wall motion was normal; there were no regional wall motion   abnormalities. Doppler parameters are consistent with abnormal   left ventricular relaxation (grade 1 diastolic dysfunction). - Aortic valve: Trileaflet; severely calcified leaflets. There was   severe stenosis. There was mild regurgitation. Mean gradient (S):   61 mm Hg. Peak gradient (S): 93 mm Hg. Valve area (VTI): 0.44   cm^2. - Mitral valve: Moderately calcified annulus. There was no   significant regurgitation. - Left atrium: The atrium was mildly dilated. - Right ventricle: The cavity size was normal. Pacer wire or   catheter noted in right ventricle. Systolic function was normal. - Right atrium:  The atrium was mildly dilated. - Tricuspid valve: Peak RV-RA gradient (S): 22 mm Hg. - Pulmonary arteries: PA peak pressure: 25 mm Hg (S). - Inferior vena cava: The vessel was normal in size. The   respirophasic diameter changes were in the normal range (>= 50%),   consistent with normal central venous pressure.  Impressions:  -  Normal LV size with mild LV hypertrophy. EF 55-60%. Normal RV   size and systolic function. Severe aortic stenosis with mild   aortic insufficiency.  ------------------------------------------------------------------- Labs, prior tests, procedures, and surgery: Permanent pacemaker system implantation.  ------------------------------------------------------------------- Study data:   Study status:  Routine.  Study completion:  There were no complications.          Transthoracic echocardiography. M-mode, complete 2D, spectral Doppler, and color Doppler. Birthdate:  Patient birthdate: [birth year].  Age:  Patient is 83 yr old.  Sex:  Gender: female.    BMI: 33.3 kg/m^2.  Blood pressure:   146/53  Patient status:  Inpatient.  Study date:  Study date: 02/15/2017. Study time: 10:13 AM.  Location:  Bedside.  -------------------------------------------------------------------  ------------------------------------------------------------------- Left ventricle:  The cavity size was normal. Wall thickness was increased in a pattern of mild LVH. Systolic function was normal. The estimated ejection fraction was in the range of 55% to 60%. Wall motion was normal; there were no regional wall motion abnormalities. Doppler parameters are consistent with abnormal left ventricular relaxation (grade 1 diastolic dysfunction).  ------------------------------------------------------------------- Aortic valve:   Trileaflet; severely calcified leaflets.  Doppler:  There was severe stenosis.   There was mild regurgitation.    VTI ratio of LVOT to aortic valve: 0.19. Valve area (VTI): 0.44 cm^2. Indexed valve area (VTI): 0.22 cm^2/m^2. Peak velocity ratio of LVOT to aortic valve: 0.23. Indexed valve area (Vmax): 0.27 cm^2/m^2. Mean velocity ratio of LVOT to aortic valve: 0.23. Indexed valve area (Vmean): 0.26 cm^2/m^2.    Mean gradient (S): 61 mm Hg. Peak gradient (S): 93 mm  Hg.  ------------------------------------------------------------------- Aorta:  Aortic root: The aortic root was normal in size. Ascending aorta: The ascending aorta was normal in size.  ------------------------------------------------------------------- Mitral valve:   Moderately calcified annulus.  Doppler:   There was no evidence for stenosis.   There was no significant regurgitation.   ------------------------------------------------------------------- Left atrium:  The atrium was mildly dilated.  ------------------------------------------------------------------- Right ventricle:  The cavity size was normal. Pacer wire or catheter noted in right ventricle. Systolic function was normal.   ------------------------------------------------------------------- Pulmonic valve:    Structurally normal valve.   Cusp separation was normal.  Doppler:  Transvalvular velocity was within the normal range. There was no regurgitation.  ------------------------------------------------------------------- Tricuspid valve:   Doppler:  There was trivial regurgitation.   ------------------------------------------------------------------- Right atrium:  The atrium was mildly dilated.  ------------------------------------------------------------------- Pericardium:  There was no pericardial effusion.  ------------------------------------------------------------------- Systemic veins: Inferior vena cava: The vessel was normal in size. The respirophasic diameter changes were in the normal range (>= 50%), consistent with normal central venous pressure.  ------------------------------------------------------------------- Measurements   Left ventricle                           Value          Reference  LV ID, ED, PLAX chordal          (L)     41.3  mm       43 - 52  LV ID, ES, PLAX chordal  31.3  mm       23 - 38  LV fx shortening, PLAX chordal   (L)     24    %         >=29  LV PW thickness, ED                      11.9  mm       ----------  IVS/LV PW ratio, ED                      0.95           <=1.3  Stroke volume, 2D                        60    ml       ----------  Stroke volume/bsa, 2D                    30    ml/m^2   ----------  LV e&', lateral                           6.09  cm/s     ----------  LV E/e&', lateral                         10.76          ----------  LV e&', medial                            5.44  cm/s     ----------  LV E/e&', medial                          12.04          ----------  LV e&', average                           5.77  cm/s     ----------  LV E/e&', average                         11.36          ----------    Ventricular septum                       Value          Reference  IVS thickness, ED                        11.3  mm       ----------    LVOT                                     Value          Reference  LVOT ID, S                               17    mm       ----------  LVOT area  2.27  cm^2     ----------  LVOT peak velocity, S                    112   cm/s     ----------  LVOT mean velocity, S                    83.7  cm/s     ----------  LVOT VTI, S                              26.4  cm       ----------  LVOT peak gradient, S                    5     mm Hg    ----------    Aortic valve                             Value          Reference  Aortic valve peak velocity, S            481   cm/s     ----------  Aortic valve mean velocity, S            369   cm/s     ----------  Aortic valve VTI, S                      136   cm       ----------  Aortic mean gradient, S                  61    mm Hg    ----------  Aortic peak gradient, S                  93    mm Hg    ----------  VTI ratio, LVOT/AV                       0.19           ----------  Aortic valve area, VTI                   0.44  cm^2     ----------  Aortic valve area/bsa, VTI               0.22  cm^2/m^2 ----------   Velocity ratio, peak, LVOT/AV            0.23           ----------  Aortic valve area/bsa, peak              0.27  cm^2/m^2 ----------  velocity  Velocity ratio, mean, LVOT/AV            0.23           ----------  Aortic valve area/bsa, mean              0.26  cm^2/m^2 ----------  velocity  Aortic regurg pressure half-time         368   ms       ----------    Aorta  Value          Reference  Aortic root ID, ED                       30    mm       ----------    Left atrium                              Value          Reference  LA ID, A-P, ES                           38    mm       ----------  LA ID/bsa, A-P                           1.92  cm/m^2   <=2.2  LA volume, S                             38.8  ml       ----------  LA volume/bsa, S                         19.6  ml/m^2   ----------  LA volume, ES, 1-p A4C                   22.6  ml       ----------  LA volume/bsa, ES, 1-p A4C               11.4  ml/m^2   ----------  LA volume, ES, 1-p A2C                   57.4  ml       ----------  LA volume/bsa, ES, 1-p A2C               28.9  ml/m^2   ----------    Mitral valve                             Value          Reference  Mitral E-wave peak velocity              65.5  cm/s     ----------  Mitral A-wave peak velocity              124   cm/s     ----------  Mitral deceleration time                 187   ms       150 - 230  Mitral E/A ratio, peak                   0.5            ----------    Pulmonary arteries                       Value          Reference  PA pressure, S, DP  25    mm Hg    <=30    Tricuspid valve                          Value          Reference  Tricuspid regurg peak velocity           234   cm/s     ----------  Tricuspid peak RV-RA gradient            22    mm Hg    ----------    Right atrium                             Value          Reference  RA ID, S-I, ES, A4C              (H)     53    mm       34 -  49  RA area, ES, A4C                 (H)     21    cm^2     8.3 - 19.5  RA volume, ES, A/L                       69.2  ml       ----------  RA volume/bsa, ES, A/L                   34.9  ml/m^2   ----------    Systemic veins                           Value          Reference  Estimated CVP                            3     mm Hg    ----------    Right ventricle                          Value          Reference  TAPSE                                    24.9  mm       ----------  RV s&', lateral, S                        10.6  cm/s     ----------  Legend: (L)  and  (H)  mark values outside specified reference range.  ------------------------------------------------------------------- Prepared and Electronically Authenticated by  Loralie Champagne, M.D. 2018-08-21T15:01:59   [facility]  CARDIAC CATHETERIZATION  Order# [id number]  Reading physician: Wellington Hampshire, MD Ordering physician: Wellington Hampshire, MD Study date: 02/16/17  Physicians   Panel Physicians Referring Physician Case Authorizing Physician  Wellington Hampshire, MD (Primary)    Procedures   RIGHT/LEFT HEART CATH AND CORONARY ANGIOGRAPHY  Conclusion     Prox RCA to Mid RCA lesion, 90 %stenosed.  Mid Cx lesion, 70 %stenosed.   1.heavily calcified  coronary arteries with significant two-vessel coronary artery disease affecting the proximal to mid right coronary artery and distal left circumflex supplying a small OM 3 branch.  2. Right heart catheterization showed normal filling pressures, no significant pulmonary hypertension and normal cardiac output.  3. Heavily calcified aortic valve with severely restricted opening. I did not attempt to cross the valve. This is known to be severely stenotic by echo.  Recommendations: Aortic valve replacement +1 vessel CABG to the right coronary artery versus TAVR and RCA PCI ( this requires atherectomy and high risk for contrast-induced nephropathy.    Indications    Nonrheumatic aortic valve stenosis [I35.0 (ICD-10-CM)]  Procedural Details/Technique   Technical Details Procedural Details: The pre-existing IV in the right antecubital vein was exchanged under sterile fashion to a slender sheath. Right heart catheterization was performed using a 5 French Swan-Ganz catheter. Cardiac output was calculated by the Fick method. The right wrist was prepped, draped, and anesthetized with 1% lidocaine. Using the modified Seldinger technique, a 5 French sheath was introduced into the right radial artery. 3 mg of verapamil was administered through the sheath, weight-based unfractionated heparin was administered intravenously. A Jackie catheter was used for selective coronary angiography. I did not attempt to cross the aortic valve There were no immediate procedural complications. A TR band was used for radial hemostasis at the completion of the procedure. The patient was transferred to the post catheterization recovery area for further monitoring.   Estimated blood loss <50 mL.  During this procedure the patient was administered the following to achieve and maintain moderate conscious sedation: Versed 1 mg, Fentanyl 25 mcg, while the patient's heart rate, blood pressure, and oxygen saturation were continuously monitored. The period of conscious sedation was 28 minutes, of which I was present face-to-face 100% of this time.    Coronary Findings   Dominance: Right  Left Anterior Descending  There is mild the vessel.  First Diagonal Branch  Vessel is small in size. There is mild disease in the vessel.  Second Diagonal Branch  There is mild disease in the vessel.  Third Diagonal Branch  Vessel is small in size. There is mild disease in the vessel.  Left Circumflex  Mid Cx lesion, 70% stenosed.  First Obtuse Marginal Branch  The vessel exhibits minimal luminal irregularities.  Second Obtuse Marginal Branch  The vessel exhibits minimal luminal irregularities.   Third Obtuse Marginal Branch  Vessel is small in size. Vessel is angiographically normal.  Right Coronary Artery  Prox RCA to Mid RCA lesion, 90% stenosed. The lesion is severely calcified.  Right Posterior Descending Artery  The vessel exhibits minimal luminal irregularities.  Right Posterior Atrioventricular Branch  There is mild disease in the vessel.  First Right Posterolateral  There is mild disease in the vessel.  Second Right Posterolateral  There is mild disease in the vessel.  Coronary Diagrams   Diagnostic Diagram       Implants     No implant documentation for this case.  PACS Images   Show images for CARDIAC CATHETERIZATION   Link to Procedure Log   Procedure Log    Hemo Data    Most Recent Value  Fick Cardiac Output 4.78 L/min  Fick Cardiac Output Index 2.54 (L/min)/BSA  RA A Wave 5 mmHg  RA V Wave 3 mmHg  RA Mean 2 mmHg  RV Systolic Pressure 33 mmHg  RV Diastolic Pressure 2 mmHg  RV EDP 6 mmHg  PA Systolic Pressure 29 mmHg  PA Diastolic Pressure 8 mmHg  PA Mean 18 mmHg  PW A Wave 7 mmHg  PW V Wave 5 mmHg  PW Mean 4 mmHg  AO Systolic Pressure 161 mmHg  AO Diastolic Pressure 54 mmHg  AO Mean 82 mmHg  QP/QS 1  TPVR Index 7.09 HRUI  TSVR Index 32.29 HRUI  PVR SVR Ratio 0.18  TPVR/TSVR Ratio 0.22    [facility]  CARDIAC CATHETERIZATION  Order# [id number]  Reading physician: [name], MD Ordering physician: [name], MD Study date: 02/23/17  Physicians   Panel Physicians Referring Physician Case Authorizing Physician  [name], MD (Primary)    Procedures   CORONARY ATHERECTOMY  Conclusion   Successful PCI of severe stenosis in the mid right coronary artery using orbital atherectomy with drug-eluting stent implantation (3.5 x 23 mm Abbott Sierra DES)    ADDENDUM REPORT: 03/09/2017 14:42  CLINICAL DATA:  11 -year-old female with severe aortic stenosis being evaluated for a TAVR procedure.  EXAM: Cardiac TAVR  CT  TECHNIQUE: The patient was scanned on a [employer]. A 120 kV retrospective scan was triggered in the descending thoracic aorta at 111 HU's. Gantry rotation speed was 250 msecs and collimation was .6 mm. No beta blockade or nitro were given. The 3D data set was reconstructed in 5% intervals of the R-R cycle. Systolic and diastolic phases were analyzed on a dedicated work station using MPR, MIP and VRT modes. The patient received 80 cc of contrast.  FINDINGS: Aortic Valve: Trileaflet, severely thickened and calcified aortic valve with severely restricted leaflet opening. No calcifications are extending into the LVOT.  Aorta:  Mild diffuse calcifications, normal size, no dissection.  Sinotubular Junction:  25 x 24 mm  Ascending Thoracic Aorta:  34 x 32 mm  Aortic Arch:  24 x 24 mm  Descending Thoracic Aorta:  22 x 22 mm  Sinus of Valsalva Measurements:  Non-coronary:  26 mm  Right -coronary:  26 mm  Left -coronary:  25 mm  Coronary Artery Height above Annulus:  Left Main:  10 mm  Right Coronary:  11 mm  Virtual Basal Annulus Measurements:  Maximum/Minimum Diameter:  22 x 19 mm  Perimeter:  66 mm  Area:  336 mm2  Coronary Arteries:  Normal origin.  Optimum Fluoroscopic Angle for Delivery:  LAO 7 CAU 7  IMPRESSION: 1. Trileaflet, severely thickened and calcified aortic valve with severely restricted leaflet opening. No calcifications are extending into the LVOT. Annular size suitable for a 23 mm Edwards-SAPIEN 3 valve or a 26 mm Evolut Pro valve.  2. Relatively short annulus to left main coronary distance.  3. Optimum Fluoroscopic Angle for Delivery:  LAO 7 CAU 7  4.  There is a large left atrial appendage with no thrombus.  5. Dilated pulmonary artery measuring 39 x 38 consistent with pulmonary hypertension.  Ena Dawley   Electronically Signed   By: Ena Dawley   On: 03/09/2017 14:42    Addended by Dorothy Spark, MD on 03/09/2017 2:44 PM    Study Result   EXAM: OVER-READ INTERPRETATION  CT CHEST  The following report is an over-read performed by radiologist Dr. Rebekah [facility] on 03/09/2017. This over-read does not include interpretation of cardiac or coronary anatomy or pathology. The coronary calcium score/coronary CTA interpretation by the cardiologist is attached.  COMPARISON:  None.  FINDINGS: Extracardiac findings will be due described under separate dictation for contemporaneously obtained CTA of the chest, abdomen and pelvis.  IMPRESSION: Please see separate dictation for contemporaneously obtained CTA of the chest, abdomen and pelvis dated 03/07/2017 for full description of relevant extracardiac findings.  Electronically Signed: By: Vinnie Langton M.D. On: 03/09/2017 08:41       CLINICAL DATA:  83 year old female with history of severe aortic stenosis. Preprocedural study prior to potential transcatheter aortic valve replacement (TAVR).  EXAM: CT ANGIOGRAPHY CHEST, ABDOMEN AND PELVIS  TECHNIQUE: Multidetector CT imaging through the chest, abdomen and pelvis was performed using the standard protocol during bolus administration of intravenous contrast. Multiplanar reconstructed images and MIPs were obtained and reviewed to evaluate the vascular anatomy.  CONTRAST:  95 mL of Isovue 370.  COMPARISON:  None.  FINDINGS: CTA CHEST FINDINGS  Cardiovascular: Heart size is mildly enlarged. There is no significant pericardial fluid, thickening or pericardial calcification. There is aortic atherosclerosis, as well as atherosclerosis of the great vessels of the mediastinum and the coronary arteries, including calcified atherosclerotic plaque in the left main, left anterior descending, left circumflex and right coronary arteries. Severe thickening calcification of the aortic valve. Left-sided  pacemaker with lead tips terminating in the right atrium and near the right ventricular apex. Dilatation of the pulmonic trunk (4 cm in diameter).  Mediastinum/Lymph Nodes: No pathologically enlarged mediastinal or hilar lymph nodes. Esophagus is unremarkable in appearance. No axillary lymphadenopathy.  Lungs/Pleura: No acute consolidative airspace disease. No pleural effusions. No suspicious appearing pulmonary nodules or masses.  Musculoskeletal/Soft Tissues: There are no aggressive appearing lytic or blastic lesions noted in the visualized portions of the skeleton.  CTA ABDOMEN AND PELVIS FINDINGS  Hepatobiliary: No cystic or solid hepatic lesions. No intra or extrahepatic biliary ductal dilatation. Gallbladder is normal in appearance.  Pancreas: No pancreatic mass. No pancreatic ductal dilatation. No pancreatic or peripancreatic fluid or inflammatory changes.  Spleen: Unremarkable.  Adrenals/Urinary Tract: 1.2 cm bilateral adrenal nodules, unchanged in size compared to prior studies (previously low-attenuation on noncontrast CT examinations), compatible with small adenomas. Bilateral kidneys are normal in appearance. No hydroureteronephrosis. Urinary bladder is normal in appearance.  Stomach/Bowel: The appearance of the stomach is normal. There is no pathologic dilatation of small bowel or colon. The appendix is not confidently identified and may be surgically absent. Regardless, there are no inflammatory changes noted adjacent to the cecum to suggest the presence of an acute appendicitis at this time.  Vascular/Lymphatic: Aortic atherosclerosis with vascular findings and measurements pertinent to potential TAVR procedure, as detailed below. No aneurysm or dissection noted in the abdominal or pelvic vasculature. Celiac axis, superior mesenteric artery and inferior mesenteric artery all appear widely patent without definite hemodynamically significant stenosis.  No lymphadenopathy noted in the abdomen or pelvis.  Reproductive: Status post hysterectomy.  Ovaries are atrophic.  Other: No significant volume of ascites.  No pneumoperitoneum.  Musculoskeletal: Status post ORIF in the right proximal femur incompletely visualized. There are no aggressive appearing lytic or blastic lesions noted in the visualized portions of the skeleton.  VASCULAR MEASUREMENTS PERTINENT TO TAVR:  AORTA:  Minimal Aortic Diameter -  10 x 13 mm  Severity of Aortic Calcification -  moderate  RIGHT PELVIS:  Right Common Iliac Artery -  Minimal Diameter - 6.7 x 7.2 mm  Tortuosity - mild  Calcification - moderate  Right External Iliac Artery -  Minimal Diameter - 6.3 x 6.3 mm  Tortuosity - mild  Calcification - mild  Right Common Femoral Artery -  Minimal Diameter - 6.6 x 3.3 mm  Tortuosity - mild  Calcification -  moderate  LEFT PELVIS:  Left Common Iliac Artery -  Minimal Diameter - 5.8 x 8.4 mm  Tortuosity - mild  Calcification - moderate  Left External Iliac Artery -  Minimal Diameter - 6.4 x 6.3 mm  Tortuosity - mild  Calcification - none  Left Common Femoral Artery -  Minimal Diameter - 6.4 x 3.3 mm  Tortuosity - mild  Calcification - moderate  Review of the MIP images confirms the above findings.  IMPRESSION: 1. Vascular findings and measurements pertinent to potential TAVR procedure, as detailed above. This patient does not appear to have suitable pelvic arterial access on either side due to small caliber of the vessels. 2. Severe thickening calcification of the aortic valve, compatible with the reported clinical history of severe aortic stenosis. 3. Aortic atherosclerosis, in addition to left main and 3 vessel coronary artery disease. 4. Dilatation of the pulmonic trunk (4 cm in diameter), suggesting pulmonary arterial hypertension. 5. Additional incidental findings, as  above. Aortic Atherosclerosis (ICD10-I70.0).   Electronically Signed   By: Vinnie Langton M.D.   On: 03/09/2017 09:20  STS Risk Calculator: Procedure: AV Replacement   Risk of Mortality: 7.976%  Morbidity or Mortality: 41.177%  Long Length of Stay: 22.993%  Short Length of Stay: 9.177%  Permanent Stroke: 4.221%  Prolonged Ventilation: 29.694%  DSW Infection: 0.19%  Renal Failure: 22.086%  Reoperation: 12.319%  Impression:  This 83 year old woman has stage D, severe, symptomatic aortic stenosis with NYHA class II symptoms of exertional shortness of breath and fatigue as well as chest tightness consistent with angina and diastolic heart failure. She had a high grade mid RCA stenosis treated with atherectomy and DES. She is not having any significant symptoms since discharge but is not doing any activity. I have personally reviewed her echo, cath and CTA studies. Her echo shows severe AS with severe thickening, calcification and reduced mobility of her trileaflet valve. The mean gradient was 61 mm Hg. Her cath showed a high grade RCA stenosis that was treated with PCI on 8/29. There is a 70% distal LCX stenosis that is insignificant and only supplies a small OM3. I agree that AVR is indicated in this patient with severe to critical AS. Her operative risk for open surgical AVR would be high and therefore TAVR is felt to be the optimal treatment for her. Her gated cardiac CT shows anatomy suitable for a 23 mm Sapien 3 valve. Her abdominal and pelvic CT shows some calcified plaque and narrowing in the common femoral arteries bilaterally but think the vasculature is adequate to allow transfemoral insertion above this and there is minimal tortuosity.   The patient was counseled at length regarding treatment alternatives for management of severe symptomatic aortic stenosis. The risks and benefits of surgical intervention has been discussed in detail. Long-term prognosis with medical therapy was  discussed. Alternative approaches such as conventional surgical aortic valve replacement, transcatheter aortic valve replacement, and palliative medical therapy were compared and contrasted at length. This discussion was placed in the context of the patient's own specific clinical presentation and past medical history. All of her questions have been addressed. The patient has been advised of a variety of complications that might develop including but not limited to risks of death, stroke, paravalvular leak, aortic dissection or other major vascular complications, aortic annulus rupture, device embolization, cardiac rupture or perforation, mitral regurgitation, acute myocardial infarction, arrhythmia, heart block or bradycardia requiring permanent pacemaker placement, congestive heart failure, respiratory failure, renal failure, pneumonia,  infection, other late complications related to structural valve deterioration or migration, or other complications that might ultimately cause a temporary or permanent loss of functional independence or other long term morbidity. The patient provides full informed consent for the procedure as described and all questions were answered.    Plan:  She will be scheduled for transfemoral TAVR on 04/12/2017 with Dr. Burt Knack and Dr. Roxy Manns.   I spent 60 minutes performing this consultation and > 50% of this time was spent face to face counseling and coordinating the care of this patient's severe aortic stenosis.   Gaye Pollack, MD 03/16/2017

## 2017-03-22 ENCOUNTER — Other Ambulatory Visit: Payer: Self-pay

## 2017-03-22 DIAGNOSIS — I350 Nonrheumatic aortic (valve) stenosis: Secondary | ICD-10-CM

## 2017-03-23 ENCOUNTER — Ambulatory Visit (INDEPENDENT_AMBULATORY_CARE_PROVIDER_SITE_OTHER): Payer: Medicare Other | Admitting: Cardiology

## 2017-03-23 ENCOUNTER — Encounter: Payer: Self-pay | Admitting: Physician Assistant

## 2017-03-23 ENCOUNTER — Encounter (INDEPENDENT_AMBULATORY_CARE_PROVIDER_SITE_OTHER): Payer: Self-pay

## 2017-03-23 VITALS — BP 138/60 | HR 77 | Ht 63.0 in | Wt 191.0 lb

## 2017-03-23 DIAGNOSIS — K2990 Gastroduodenitis, unspecified, without bleeding: Secondary | ICD-10-CM

## 2017-03-23 DIAGNOSIS — N184 Chronic kidney disease, stage 4 (severe): Secondary | ICD-10-CM | POA: Diagnosis not present

## 2017-03-23 DIAGNOSIS — I129 Hypertensive chronic kidney disease with stage 1 through stage 4 chronic kidney disease, or unspecified chronic kidney disease: Secondary | ICD-10-CM

## 2017-03-23 DIAGNOSIS — E785 Hyperlipidemia, unspecified: Secondary | ICD-10-CM

## 2017-03-23 DIAGNOSIS — E1122 Type 2 diabetes mellitus with diabetic chronic kidney disease: Secondary | ICD-10-CM | POA: Diagnosis not present

## 2017-03-23 DIAGNOSIS — K2970 Gastritis, unspecified, without bleeding: Secondary | ICD-10-CM

## 2017-03-23 DIAGNOSIS — I350 Nonrheumatic aortic (valve) stenosis: Secondary | ICD-10-CM | POA: Diagnosis not present

## 2017-03-23 DIAGNOSIS — N185 Chronic kidney disease, stage 5: Secondary | ICD-10-CM | POA: Insufficient documentation

## 2017-03-23 DIAGNOSIS — I2510 Atherosclerotic heart disease of native coronary artery without angina pectoris: Secondary | ICD-10-CM

## 2017-03-23 MED ORDER — PANTOPRAZOLE SODIUM 40 MG PO TBEC: 40.0000 mg | DELAYED_RELEASE_TABLET | Freq: Every day | ORAL | 5 refills | Status: DC

## 2017-03-23 NOTE — Patient Instructions (Signed)
Medication Instructions:  Your physician recommends that you continue on your current medications as directed. Please refer to the Current Medication list given to you today.   Labwork: 05/06/17 FOR FASTING LIPID AND LIVER PANEL; IF YOU HAVE DONE LAB WITH PRIMARY CARE, THEN CANCEL LAB AT OUR OFFICE  Testing/Procedures: NONE ORDERED TODAY  Follow-Up: 3-4 MONTH GENERAL CARDIOLOGY WITH DR. Lewayne Bunting OR [facility] Any Other Special Instructions Will Be Listed Below (If Applicable).     If you need a refill on your cardiac medications before your next appointment, please call your pharmacy.

## 2017-03-23 NOTE — Progress Notes (Signed)
Cardiology Office Note:    Date:  03/23/2017   ID:  Molly Cobb, DOB Sep 26, 1931, MRN [id number]  PCP:  Guadlupe Spanish, MD  Cardiologist:  Dr. Burt Knack [facility] [place], [facility])  Referring MD: Guadlupe Spanish, MD   Chief Complaint  Patient presents with  . Follow-up    cath and stent    History of Present Illness:    Molly Cobb is a 83 y.o. female with a past medical history significant for Hypertension, HLD, hypothyroidism, stage IV CKD, diabetes type 2, S/P PPM, anemia, severe stage DI aortic stenosis, CAD and PPM ([employer]).  Molly Cobb was recently admitted to [facility] on 02/13/17 with angina in the setting of acute anemia. She was noted to have a murmur on physical exam with echo showing severe aortic stenosis with mean transient aortic gradient of 61 mmHg. She hadn't been too symptomatic until recently when she had significant anemia. She admitted to recent episodes of dyspnea on exertion and chest discomfort with physical exertion. She underwent left and right heart cath on 02/16/17 showing severe calcificstenosis of the RCA and heavily calcified aortic valve with severely restricted opening.  She was evaluated by Dr. Roxy Manns and felt to be a good candidate for TAVR. She then had successful PCI and DES of  the mid RCA on 02/23/17 and is planned for TAVR on 04/12/17 with Dr. Burt Knack. Pravastatin was changed to Crestor at the hospital.  In her workup for anemia in the hospital she was fecal occult blood positive and GI was consulted. An EGD showed actively oozing AVMs status post APC and mild gastritis. Dr. Ardis Hughs started her on a PPI, tested for H. pylori and cleared her for PCI and Her with appropriate antiplatelet therapy. Her most recent hemoglobin was stable at 9.2. She has chronic kidney disease and she maintain a stable creatinine level with maximum of 2.64 and on discharge was 2.26 on 02/24/17.   The patient is here today with her daughter. She is feeling well since her stent.  She denies chest pain, shortness of breath, orthopnea, edema, near syncope/syncope. She has mild lightheadedness when standing up from bending over position, but no other significant lightheadedness or dizziness. She is taking all of her medications as directed including aspirin and Plavix. She denies any unusual bleeding, blood in the stools or dark and tarry stools. She is very pleasant and looking forward to having her valve procedure.  Most recent pacemaker download 01/16/17 showed: No Significant Atrial or Ventricular Arrhythmias. Battery Status Adequate Battery Voltage. Lead Trends Lead Trends Stable   Past Medical History:  Diagnosis Date  . CKD (chronic kidney disease)    a. [facility]   . Coronary artery disease    02/23/17 PCI/DES to mRCA,  normal EF  . Depression   . Diabetes mellitus ([place])   . Gout   . Hypertension   . Presence of permanent cardiac pacemaker    a. followed by [facility]  . Severe aortic stenosis    a. diagnosed 01/2017 during admission for chest pain.   . Thyroid disease     Past Surgical History:  Procedure Laterality Date  . CORONARY ATHERECTOMY N/A 02/23/2017   Procedure: CORONARY ATHERECTOMY;  Surgeon: [name], MD;  Location: [facility];  Service: Cardiovascular;  Laterality: N/A;  . ESOPHAGOGASTRODUODENOSCOPY N/A 02/18/2017   Procedure: ESOPHAGOGASTRODUODENOSCOPY (EGD);  Surgeon: Milus Banister, MD;  Location: [facility] ENDOSCOPY;  Service: Endoscopy;  Laterality: N/A;  .  FEMUR FRACTURE SURGERY    . HOT HEMOSTASIS N/A 02/18/2017   Procedure: HOT HEMOSTASIS (ARGON PLASMA COAGULATION/BICAP);  Surgeon: Milus Banister, MD;  Location: [facility] ENDOSCOPY;  Service: Endoscopy;  Laterality: N/A;  . IR RADIOLOGY PERIPHERAL GUIDED IV START  03/07/2017  . IR US GUIDE VASC ACCESS RIGHT  03/07/2017  . RIGHT/LEFT HEART CATH AND CORONARY ANGIOGRAPHY N/A 02/16/2017   Procedure: RIGHT/LEFT HEART CATH AND CORONARY ANGIOGRAPHY;  Surgeon: Wellington Hampshire, MD;  Location: [facility];  Service: Cardiovascular;  Laterality: N/A;  . ROTATOR CUFF REPAIR      Current Medications: Current Meds  Medication Sig  . amLODipine (NORVASC) 10 MG tablet Take 10 mg by mouth daily.  Marland Kitchen aspirin 81 MG chewable tablet Chew 1 tablet (81 mg total) by mouth daily.  . clopidogrel (PLAVIX) 75 MG tablet Take 1 tablet (75 mg total) by mouth daily.  Marland Kitchen levothyroxine (SYNTHROID, LEVOTHROID) 100 MCG tablet Take 100 mcg by mouth daily before breakfast.  . metoprolol tartrate (LOPRESSOR) 25 MG tablet Take 0.5 tablets (12.5 mg total) by mouth 2 (two) times daily.  . nitroGLYCERIN (NITROSTAT) 0.4 MG SL tablet Place 1 tablet (0.4 mg total) under the tongue every 5 (five) minutes as needed.  . rosuvastatin (CRESTOR) 40 MG tablet Take 1 tablet (40 mg total) by mouth at bedtime.  . sodium bicarbonate 650 MG tablet Take 650 mg by mouth 2 (two) times daily.      Allergies:   Sulfa antibiotics   Social History   Social History  . Marital status: Widowed    Spouse name: N/A  . Number of children: N/A  . Years of education: N/A   Social History Main Topics  . Smoking status: Never Smoker  . Smokeless tobacco: Never Used  . Alcohol use No  . Drug use: No  . Sexual activity: Not Asked   Other Topics Concern  . None   Social History Narrative  . None     Family History: The patient's family history includes CAD in her mother; Diabetes in her father; Healthy in her sister and sister; Hypertension in her sister; Lung cancer in her sister; Stroke in her mother. There is no history of Heart disease. ROS:   Please see the history of present illness.     All other systems reviewed and are negative.  EKGs/Labs/Other Studies Reviewed:    The following studies were reviewed today:  CORONARY ATHERECTOMY  02/23/17  Conclusion  Successful PCI of severe stenosis in the mid right coronary artery using orbital atherectomy with drug-eluting stent implantation  (3.5 x 23 mm Abbott Sierra DES)   _______________________________________________________________________________________  RIGHT/LEFT HEART CATH AND CORONARY ANGIOGRAPHY  02/16/17  Conclusion    Prox RCA to Mid RCA lesion, 90 %stenosed.  Mid Cx lesion, 70 %stenosed.   1.  heavily calcified coronary arteries with significant two-vessel coronary artery disease affecting the proximal to mid right coronary artery and distal left circumflex supplying a small OM 3 branch. 2.   Right heart catheterization showed normal filling pressures, no significant pulmonary hypertension and normal cardiac output. 3.   Heavily calcified aortic valve with severely restricted opening. I did not attempt to cross the valve. This is known to be severely stenotic by echo.  Recommendations: Aortic valve replacement +1 vessel CABG to the right coronary artery versus TAVR and RCA PCI ( this requires atherectomy and high risk for contrast-induced nephropathy.    ______________________________________________________________________________________  Echocardiogram 02/15/2017 Study Conclusions - Left ventricle:  The cavity size was normal. Wall thickness was   increased in a pattern of mild LVH. Systolic function was normal.   The estimated ejection fraction was in the range of 55% to 60%.   Wall motion was normal; there were no regional wall motion   abnormalities. Doppler parameters are consistent with abnormal   left ventricular relaxation (grade 1 diastolic dysfunction). - Aortic valve: Trileaflet; severely calcified leaflets. There was   severe stenosis. There was mild regurgitation. Mean gradient (S):   61 mm Hg. Peak gradient (S): 93 mm Hg. Valve area (VTI): 0.44   cm^2. - Mitral valve: Moderately calcified annulus. There was no   significant regurgitation. - Left atrium: The atrium was mildly dilated. - Right ventricle: The cavity size was normal. Pacer wire or   catheter noted in right ventricle.  Systolic function was normal. - Right atrium: The atrium was mildly dilated. - Tricuspid valve: Peak RV-RA gradient (S): 22 mm Hg. - Pulmonary arteries: PA peak pressure: 25 mm Hg (S). - Inferior vena cava: The vessel was normal in size. The   respirophasic diameter changes were in the normal range (>= 50%),   consistent with normal central venous pressure. Impressions: - Normal LV size with mild LV hypertrophy. EF 55-60%. Normal RV   size and systolic function. Severe aortic stenosis with mild   aortic insufficiency.  EKG:  EKG is  ordered today.  The ekg ordered today demonstrates atrial pacing at 77 bpm  Recent Labs: 02/16/2017: Magnesium 1.9 02/23/2017: ALT 29 02/24/2017: BUN 37; Creatinine, Ser 2.26; Hemoglobin 9.2; Platelets 183; Potassium 4.7; Sodium 141   Recent Lipid Panel No results found for: CHOL, TRIG, HDL, CHOLHDL, VLDL, LDLCALC, LDLDIRECT  Physical Exam:    VS:  BP 138/60   Pulse 77   Ht 5\' 3"  (1.6 m)   Wt 191 lb (86.6 kg)   BMI 33.83 kg/m     Wt Readings from Last 3 Encounters:  03/23/17 191 lb (86.6 kg)  03/16/17 187 lb (84.8 kg)  02/24/17 176 lb 5.9 oz (80 kg)     Physical Exam  Constitutional: She is oriented to person, place, and time. She appears well-developed and well-nourished.  Central obesity  HENT:  Head: Normocephalic and atraumatic.  Neck: Normal range of motion. Neck supple. No JVD present.  Cardiovascular: Normal rate and regular rhythm.   Murmur heard.  Harsh midsystolic murmur is present with a grade of 3/6  at the upper right sternal border radiating to the neck Pulmonary/Chest: Effort normal and breath sounds normal. No respiratory distress. She has no wheezes. She has no rales.  Abdominal: Soft. Bowel sounds are normal. She exhibits no distension. There is no tenderness.  Musculoskeletal: Normal range of motion. She exhibits no edema.  Neurological: She is alert and oriented to person, place, and time.  Skin: Skin is warm and dry.    Psychiatric: She has a normal mood and affect. Her behavior is normal. Thought content normal.     ASSESSMENT:    1. Coronary artery disease involving native coronary artery of native heart without angina pectoris   2. Hyperlipidemia, unspecified hyperlipidemia type   3. Nonrheumatic aortic valve stenosis   4. Benign hypertension with CKD (chronic kidney disease) stage IV ([place])   5. Hyperlipidemia LDL goal <70   6. Type 2 diabetes mellitus with stage 4 chronic kidney disease, without long-term current use of insulin ([place])   7. Gastritis and gastroduodenitis    PLAN:    In  order of problems listed above:  1. CAD: S/P DES to RCA 02/23/17, residual 70% stenosis of mid Cx. On Plavix, aspirin 81 mg, BB and statin. Patient had had only mild angina type symptoms in the setting of anemia. No chest pain or shortness of breath since stent. No unusual bleeding or dark, tarry stools. Right wrist cath site soft, non-teneder without hematoma.   2. Severe aortic stenosis: Right heart cath done 02/16/17 that revealed heavily calcified aortic valve with severely restricted opening.  She was evaluated by Dr. Roxy Manns and felt to be a good candidate for TAVR. TAVR is scheduled with Dr. Burt Knack for 04/12/17, pre TAVR labs scheduled for 10/12.   3. CKD stage IV: Thought to be from diabetic nephropathy, Followed by [facility], Dr. Neta Ehlers.  Renal function remained stable through her hospital stay with serum creatinine maximum of 2.64 and on discharge was 2.26 on 02/24/17.   4. Hypertension: BP stable. Continue current medications.   5. Hyperlipidemia: Switched from pravastatin to Crestor in the hospital. Followed by PCP, Dr. Holley Raring. Should have fasting lipid panel and LFT's checked in 6 weeks.  6. Diabetes type 2:  Without long term use of insulin Hemoglobin A1c in 07/2015 was 5.3. Management per PCP, Dr. Holley Raring.  7. Gastritis: In the hospital EGD showed a single small, actively oozing gastric AVM (slow  persisent oozing) treated with APC for hemostasis and to destroy the lesion. Also found mild pan-gastritis, biospies to check for H. Pylori. To contineu daily PPI. She has not been to GI for follow up. She plans to follow up after TAVR. No current symptoms or dark stools.   Will send today's note to Dr. Holley Raring and Dr. Lewayne Bunting.  Medication Adjustments/Labs and Tests Ordered: Current medicines are reviewed at length with the patient today.  Concerns regarding medicines are outlined above. Labs and tests ordered and medication changes are outlined in the patient instructions below:  Patient Instructions  Medication Instructions:  Your physician recommends that you continue on your current medications as directed. Please refer to the Current Medication list given to you today.   Labwork: 05/06/17 FOR FASTING LIPID AND LIVER PANEL; IF YOU HAVE DONE LAB WITH PRIMARY CARE, THEN CANCEL LAB AT OUR OFFICE  Testing/Procedures: NONE ORDERED TODAY  Follow-Up: 3-4 MONTH GENERAL CARDIOLOGY WITH DR. Lewayne Bunting OR [facility] Any Other Special Instructions Will Be Listed Below (If Applicable).     If you need a refill on your cardiac medications before your next appointment, please call your pharmacy.    Signed, Daune Perch, NP  03/23/2017 5:12 PM    [facility]

## 2017-04-08 ENCOUNTER — Encounter (HOSPITAL_COMMUNITY)
Admission: RE | Admit: 2017-04-08 | Discharge: 2017-04-08 | Disposition: A | Discharge status: Home / Self Care | Payer: Medicare Other | Source: Ambulatory Visit | Attending: Cardiovascular Disease | Admitting: Cardiovascular Disease

## 2017-04-08 ENCOUNTER — Encounter (HOSPITAL_COMMUNITY): Payer: Self-pay

## 2017-04-08 DIAGNOSIS — Z01812 Encounter for preprocedural laboratory examination: Secondary | ICD-10-CM | POA: Diagnosis present

## 2017-04-08 DIAGNOSIS — Z950 Presence of cardiac pacemaker: Secondary | ICD-10-CM | POA: Diagnosis not present

## 2017-04-08 DIAGNOSIS — I495 Sick sinus syndrome: Secondary | ICD-10-CM | POA: Insufficient documentation

## 2017-04-08 DIAGNOSIS — I2510 Atherosclerotic heart disease of native coronary artery without angina pectoris: Secondary | ICD-10-CM | POA: Insufficient documentation

## 2017-04-08 DIAGNOSIS — I129 Hypertensive chronic kidney disease with stage 1 through stage 4 chronic kidney disease, or unspecified chronic kidney disease: Secondary | ICD-10-CM | POA: Diagnosis not present

## 2017-04-08 DIAGNOSIS — N189 Chronic kidney disease, unspecified: Secondary | ICD-10-CM | POA: Insufficient documentation

## 2017-04-08 DIAGNOSIS — Z01810 Encounter for preprocedural cardiovascular examination: Secondary | ICD-10-CM | POA: Diagnosis present

## 2017-04-08 DIAGNOSIS — Z9889 Other specified postprocedural states: Secondary | ICD-10-CM | POA: Diagnosis not present

## 2017-04-08 DIAGNOSIS — Z79899 Other long term (current) drug therapy: Secondary | ICD-10-CM | POA: Diagnosis not present

## 2017-04-08 DIAGNOSIS — Z7982 Long term (current) use of aspirin: Secondary | ICD-10-CM | POA: Insufficient documentation

## 2017-04-08 DIAGNOSIS — E039 Hypothyroidism, unspecified: Secondary | ICD-10-CM | POA: Diagnosis not present

## 2017-04-08 DIAGNOSIS — E1122 Type 2 diabetes mellitus with diabetic chronic kidney disease: Secondary | ICD-10-CM | POA: Diagnosis not present

## 2017-04-08 DIAGNOSIS — I350 Nonrheumatic aortic (valve) stenosis: Secondary | ICD-10-CM | POA: Diagnosis present

## 2017-04-08 HISTORY — DX: Hypothyroidism, unspecified: E03.9

## 2017-04-08 LAB — COMPREHENSIVE METABOLIC PANEL
ALBUMIN: 4.1 g/dL (ref 3.5–5.0)
ALT: 83 U/L — ABNORMAL HIGH (ref 14–54)
ANION GAP: 13 (ref 5–15)
AST: 105 U/L — ABNORMAL HIGH (ref 15–41)
Alkaline Phosphatase: 121 U/L (ref 38–126)
BILIRUBIN TOTAL: 0.6 mg/dL (ref 0.3–1.2)
BUN: 47 mg/dL — ABNORMAL HIGH (ref 6–20)
CO2: 20 mmol/L — ABNORMAL LOW (ref 22–32)
Calcium: 9.4 mg/dL (ref 8.9–10.3)
Chloride: 103 mmol/L (ref 101–111)
Creatinine, Ser: 2.76 mg/dL — ABNORMAL HIGH (ref 0.44–1.00)
GFR calc non Af Amer: 15 mL/min — ABNORMAL LOW (ref >60)
GFR, EST AFRICAN AMERICAN: 17 mL/min — AB (ref >60)
GLUCOSE: 111 mg/dL — AB (ref 65–99)
POTASSIUM: 4.3 mmol/L (ref 3.5–5.1)
SODIUM: 136 mmol/L (ref 135–145)
TOTAL PROTEIN: 7.9 g/dL (ref 6.5–8.1)

## 2017-04-08 LAB — CBC
HEMATOCRIT: 36.4 % (ref 36.0–46.0)
Hemoglobin: 11.6 g/dL — ABNORMAL LOW (ref 12.0–15.0)
MCH: 25.7 pg — ABNORMAL LOW (ref 26.0–34.0)
MCHC: 31.9 g/dL (ref 30.0–36.0)
MCV: 80.7 fL (ref 78.0–100.0)
Platelets: 163 10*3/uL (ref 150–400)
RBC: 4.51 MIL/uL (ref 3.87–5.11)
RDW: 14.1 % (ref 11.5–15.5)
WBC: 8.5 10*3/uL (ref 4.0–10.5)

## 2017-04-08 LAB — URINALYSIS, ROUTINE W REFLEX MICROSCOPIC
BILIRUBIN URINE: NEGATIVE
Glucose, UA: NEGATIVE mg/dL
Ketones, ur: NEGATIVE mg/dL
Nitrite: NEGATIVE
PH: 7 (ref 5.0–8.0)
Protein, ur: NEGATIVE mg/dL
SPECIFIC GRAVITY, URINE: 1.004 — AB (ref 1.005–1.030)

## 2017-04-08 LAB — APTT: APTT: 51 s — AB (ref 24–36)

## 2017-04-08 LAB — BLOOD GAS, ARTERIAL
ACID-BASE DEFICIT: 2.8 mmol/L — AB (ref 0.0–2.0)
BICARBONATE: 20.4 mmol/L (ref 20.0–28.0)
Drawn by: 470691
FIO2: 21
O2 Saturation: 87 %
PATIENT TEMPERATURE: 98.6
pCO2 arterial: 29 mmHg — ABNORMAL LOW (ref 32.0–48.0)
pH, Arterial: 7.462 — ABNORMAL HIGH (ref 7.350–7.450)
pO2, Arterial: 51 mmHg — ABNORMAL LOW (ref 83.0–108.0)

## 2017-04-08 LAB — PROTIME-INR
INR: 1.08
PROTHROMBIN TIME: 13.9 s (ref 11.4–15.2)

## 2017-04-08 LAB — HEMOGLOBIN A1C
Hgb A1c MFr Bld: 7.4 % — ABNORMAL HIGH (ref 4.8–5.6)
MEAN PLASMA GLUCOSE: 165.68 mg/dL

## 2017-04-08 LAB — SURGICAL PCR SCREEN
MRSA, PCR: NEGATIVE
STAPHYLOCOCCUS AUREUS: NEGATIVE

## 2017-04-08 LAB — GLUCOSE, CAPILLARY: GLUCOSE-CAPILLARY: 112 mg/dL — AB (ref 65–99)

## 2017-04-08 NOTE — Progress Notes (Addendum)
Pacemaker form faxed to [facility] in [place].  Also message left for Tomi Bamberger with [employer] of upcoming [place].   Just found out Dr Woodroe Chen died last month. Will F/U asap                    Called for records at office. Pt will f/u with Dr Burt Knack

## 2017-04-08 NOTE — Pre-Procedure Instructions (Signed)
Arial Galligan  04/08/2017      [facility] - [place], [place] - [address] [place] [address] Phone: [phone] Fax: [phone]    Your procedure is scheduled on 04/12/17.  Report to [facility] Admitting at 530 A.M.  Call this number if you have problems the morning of surgery:  [phone]   Remember:  Do not eat food or drink liquids after midnight.  Take these medicines the morning of surgery with A SIP OF WATER --norvasc,aspirin,synthroid,protonix   Do not wear jewelry, make-up or nail polish.  Do not wear lotions, powders, or perfumes, or deoderant.  Do not shave 48 hours prior to surgery.  Men may shave face and neck.  Do not bring valuables to the hospital.  [facility] is not responsible for any belongings or valuables.  Contacts, dentures or bridgework may not be worn into surgery.  Leave your suitcase in the car.  After surgery it may be brought to your room.  For patients admitted to the hospital, discharge time will be determined by your treatment team.  Patients discharged the day of surgery will not be allowed to drive home.   Name and phone number of your driver:    Special instructions:    Please read over the following fact sheets that you were given. MRSA Information Do not take any aspirin,anti-inflammatories,vitamins,or herbal supplements 5-7 days prior to surgery.[facility] - Preparing for Surgery  Before surgery, you can play an important role.  Because skin is not sterile, your skin needs to be as free of germs as possible.  You can reduce the number of germs on you skin by washing with CHG (chlorahexidine gluconate) soap before surgery.  CHG is an antiseptic cleaner which kills germs and bonds with the skin to continue killing germs even after washing.  Please DO NOT use if you have an allergy to CHG or antibacterial soaps.  If your skin becomes reddened/irritated stop using the CHG and  inform your nurse when you arrive at Short Stay.  Do not shave (including legs and underarms) for at least 48 hours prior to the first CHG shower.  You may shave your face.  Please follow these instructions carefully:   1.  Shower with CHG Soap the night before surgery and the                                morning of Surgery.  2.  If you choose to wash your hair, wash your hair first as usual with your       normal shampoo.  3.  After you shampoo, rinse your hair and body thoroughly to remove the                      Shampoo.  4.  Use CHG as you would any other liquid soap.  You can apply chg directly       to the skin and wash gently with scrungie or a clean washcloth.  5.  Apply the CHG Soap to your body ONLY FROM THE NECK DOWN.        Do not use on open wounds or open sores.  Avoid contact with your eyes,       ears, mouth and genitals (private parts).  Wash genitals (private parts)       with your normal soap.  6.  Wash thoroughly, paying special attention to the area where your surgery        will be performed.  7.  Thoroughly rinse your body with warm water from the neck down.  8.  DO NOT shower/wash with your normal soap after using and rinsing off       the CHG Soap.  9.  Pat yourself dry with a clean towel.            10.  Wear clean pajamas.            11.  Place clean sheets on your bed the night of your first shower and do not        sleep with pets.  Day of Surgery  Do not apply any lotions/deoderants the morning of surgery.  Please wear clean clothes to the hospital/surgery center.

## 2017-04-08 NOTE — Progress Notes (Signed)
Blood bank called.  Said that patient had blood transfusion back in August, and blood can only be held x 3 day.  Will need another sample on 10/16 -- dos

## 2017-04-11 MED ORDER — DEXTROSE 5 % IV SOLN
1.5000 g | INTRAVENOUS | Status: AC
  Administered 2017-04-12: 1.5 g via INTRAVENOUS
  Filled 2017-04-11: qty 1.5

## 2017-04-11 MED ORDER — SODIUM CHLORIDE 0.9 % IV SOLN
INTRAVENOUS | Status: DC
  Filled 2017-04-11: qty 30

## 2017-04-11 MED ORDER — POTASSIUM CHLORIDE 2 MEQ/ML IV SOLN
80.0000 meq | INTRAVENOUS | Status: DC
Start: 2017-04-12 — End: 2017-04-12
  Filled 2017-04-11: qty 40

## 2017-04-11 MED ORDER — SODIUM CHLORIDE 0.9 % IV SOLN
30.0000 ug/min | INTRAVENOUS | Status: DC
  Filled 2017-04-11: qty 2

## 2017-04-11 MED ORDER — DOPAMINE-DEXTROSE 3.2-5 MG/ML-% IV SOLN
0.0000 ug/kg/min | INTRAVENOUS | Status: DC
  Filled 2017-04-11: qty 250

## 2017-04-11 MED ORDER — DEXMEDETOMIDINE HCL IN NACL 400 MCG/100ML IV SOLN
0.1000 ug/kg/h | INTRAVENOUS | Status: AC
  Administered 2017-04-12: .5 ug/kg/h via INTRAVENOUS
  Filled 2017-04-11: qty 100

## 2017-04-11 MED ORDER — SODIUM CHLORIDE 0.9 % IV SOLN
1250.0000 mg | INTRAVENOUS | Status: AC
  Administered 2017-04-12: 1250 mg via INTRAVENOUS
  Filled 2017-04-11: qty 1250

## 2017-04-11 MED ORDER — EPINEPHRINE PF 1 MG/ML IJ SOLN
0.0000 ug/min | INTRAVENOUS | Status: DC
  Filled 2017-04-11: qty 4

## 2017-04-11 MED ORDER — MAGNESIUM SULFATE 50 % IJ SOLN
40.0000 meq | INTRAMUSCULAR | Status: DC
  Filled 2017-04-11: qty 10

## 2017-04-11 MED ORDER — SODIUM CHLORIDE 0.9 % IV SOLN
INTRAVENOUS | Status: DC
  Filled 2017-04-11: qty 1

## 2017-04-11 MED ORDER — NOREPINEPHRINE BITARTRATE 1 MG/ML IV SOLN
0.0000 ug/min | INTRAVENOUS | Status: DC
  Filled 2017-04-11: qty 4

## 2017-04-11 MED ORDER — NITROGLYCERIN IN D5W 200-5 MCG/ML-% IV SOLN
2.0000 ug/min | INTRAVENOUS | Status: DC
  Filled 2017-04-11: qty 250

## 2017-04-11 NOTE — Progress Notes (Signed)
Anesthesia Chart Review:  Pt is an 83 year old female scheduled for TAVR on 04/12/2017 with [name], MD  - Nephrologist is Red Christians, MD (notes in care everywhere)   PMH includes:  CAD (s/p PCI/DES to [facility] 02/23/17), severe aortic stenosis, sick sinus syndrome, pacemaker ([employer]), HTN, DM, CKD (stage 4), hypothroidism. Never smoker. BMI 33  Medications include: amlodipine, ASA 81 mg, Plavix, levothyroxine, metoprolol, Protonix, rosuvastatin. Pt to continue ASA and plavix perioperatively.   BP (!) 147/85   Pulse 98   Temp 36.4 C (Oral)   Resp 18   Ht 5\' 3"  (1.6 m)   Wt 185 lb 1 oz (83.9 kg)   SpO2 100%   BMI 32.78 kg/m   Preoperative labs reviewed.   - HbA1c 7.4, glucose 111 - Cr 2.76, BUN 47.  Baseline Cr appears to be around 2.3.  - PT normal, PTT 51.  - T&S will be redrawn DOS.  - I notified Lauren in Dr. Antionette Char office of elevated PTT and Cr.    CXR 04/08/17: No active cardiopulmonary disease.  EKG 03/23/17: Electronic atrial pacemaker. Cannot rule out anterior infarct, age undetermined.   Cardiac cath 02/16/17:   Prox RCA to Mid RCA lesion, 90 %stenosed.  Mid Cx lesion, 70 %stenosed.  1.heavily calcified coronary arteries with significant two-vessel coronary artery disease affecting the proximal to mid right coronary artery and distal left circumflex supplying a small OM 3 branch. - S/p PCI/DES to mRCA   2. Right heart catheterization showed normal filling pressures, no significant pulmonary hypertension and normal cardiac output.  3. Heavily calcified aortic valve with severely restricted opening. I did not attempt to cross the valve. This is known to be severely stenotic by echo.  Carotid duplex 02/16/17:  - Findings consistent with a 1- 39 percent stenosis involving the R ICA and the L ICA. - Limited evaluation of plaque in the internal carotid arteries bilaterally due to deep nature of vessels.  - Bilateral vertebral arteries appear patent and  antegrade.  Echo 02/15/17:  - Left ventricle: The cavity size was normal. Wall thickness was increased in a pattern of mild LVH. Systolic function was normal. The estimated ejection fraction was in the range of 55% to 60%. Wall motion was normal; there were no regional wall motion abnormalities. Doppler parameters are consistent with abnormal left ventricular relaxation (grade 1 diastolic dysfunction). - Aortic valve: Trileaflet; severely calcified leaflets. There was severe stenosis. There was mild regurgitation. Mean gradient (S):  61 mm Hg. Peak gradient (S): 93 mm Hg. Valve area (VTI): 0.44 cm^2. - Mitral valve: Moderately calcified annulus. There was no significant regurgitation. - Left atrium: The atrium was mildly dilated. - Right ventricle: The cavity size was normal. Pacer wire or catheter noted in right ventricle. Systolic function was normal. - Right atrium: The atrium was mildly dilated. - Tricuspid valve: Peak RV-RA gradient (S): 22 mm Hg. - Pulmonary arteries: PA peak pressure: 25 mm Hg (S). - Inferior vena cava: The vessel was normal in size. The respirophasic diameter changes were in the normal range (>= 50%), consistent with normal central venous pressure.  If no changes, I anticipate pt can proceed with surgery as scheduled.   Willeen Cass, FNP-BC [facility] Short Stay Surgical Center/Anesthesiology Phone: [phone] 04/11/2017 11:13 AM

## 2017-04-11 NOTE — Anesthesia Preprocedure Evaluation (Addendum)
Anesthesia Evaluation  Patient identified by MRN, date of birth, ID band Patient awake    Reviewed: Allergy & Precautions, NPO status , Patient's Chart, lab work & pertinent test results  History of Anesthesia Complications Negative for: history of anesthetic complications  Airway Mallampati: I  TM Distance: >3 FB Neck ROM: Full    Dental  (+) Edentulous Upper, Edentulous Lower, Lower Dentures, Upper Dentures   Pulmonary neg pulmonary ROS,    breath sounds clear to auscultation       Cardiovascular hypertension, Pt. on medications + CAD (90% RCA, 70% Cx) and + Cardiac Stents (DES to RCA)  + pacemaker (for Sick Sinus) + Valvular Problems/Murmurs (severe AS, AVA 0.44 cm2) AS  Rhythm:Regular Rate:Normal  8/18 ECHO: EF 55-60%, severe AS with Peak gradient 93 mmHg, mean gradient 61 mmHg, AVA 0.44 cm2(VTI)   Neuro/Psych negative neurological ROS     GI/Hepatic Neg liver ROS, GERD  Medicated and Controlled,  Endo/Other  diabetes (glu 127)Hypothyroidism   Renal/GU Renal InsufficiencyRenal disease (creat 2.76)     Musculoskeletal   Abdominal (+) + obese,   Peds  Hematology negative hematology ROS (+)   Anesthesia Other Findings   Reproductive/Obstetrics                            Anesthesia Physical Anesthesia Plan  ASA: IV  Anesthesia Plan: MAC   Post-op Pain Management:    Induction:   PONV Risk Score and Plan: 2 and Ondansetron and Treatment may vary due to age or medical condition  Airway Management Planned: Natural Airway and Simple Face Mask  Additional Equipment: Arterial line, CVP and Ultrasound Guidance Line Placement  Intra-op Plan:   Post-operative Plan:   Informed Consent: I have reviewed the patients History and Physical, chart, labs and discussed the procedure including the risks, benefits and alternatives for the proposed anesthesia with the patient or authorized  representative who has indicated his/her understanding and acceptance.   Dental advisory given  Plan Discussed with: CRNA and Surgeon  Anesthesia Plan Comments: (Plan routine monitors, A line, Central line, MAC )        Anesthesia Quick Evaluation

## 2017-04-12 ENCOUNTER — Encounter (HOSPITAL_COMMUNITY)
Admission: RE | Disposition: A | Discharge status: Home Health | Payer: Self-pay | Source: Ambulatory Visit | Attending: Cardiovascular Disease

## 2017-04-12 ENCOUNTER — Inpatient Hospital Stay (HOSPITAL_COMMUNITY): Payer: Medicare Other

## 2017-04-12 ENCOUNTER — Inpatient Hospital Stay (HOSPITAL_COMMUNITY): Payer: Medicare Other | Admitting: Anesthesiology

## 2017-04-12 ENCOUNTER — Encounter (HOSPITAL_COMMUNITY): Payer: Self-pay | Admitting: *Deleted

## 2017-04-12 ENCOUNTER — Inpatient Hospital Stay (HOSPITAL_COMMUNITY)
Admission: RE | Admit: 2017-04-12 | Discharge: 2017-04-14 | DRG: 267 | Disposition: A | Discharge status: Home Health | Payer: Medicare Other | Source: Ambulatory Visit | Attending: Cardiovascular Disease | Admitting: Cardiovascular Disease

## 2017-04-12 ENCOUNTER — Encounter: Payer: Self-pay | Admitting: Thoracic Surgery (Cardiothoracic Vascular Surgery)

## 2017-04-12 ENCOUNTER — Inpatient Hospital Stay (HOSPITAL_COMMUNITY): Payer: Medicare Other | Admitting: Emergency Medicine

## 2017-04-12 ENCOUNTER — Inpatient Hospital Stay (HOSPITAL_COMMUNITY)
Admission: RE | Admit: 2017-04-12 | Discharge: 2017-04-12 | Disposition: A | Discharge status: Home / Self Care | Payer: Medicare Other | Source: Ambulatory Visit | Attending: Cardiovascular Disease | Admitting: Cardiovascular Disease

## 2017-04-12 DIAGNOSIS — E1122 Type 2 diabetes mellitus with diabetic chronic kidney disease: Secondary | ICD-10-CM | POA: Diagnosis present

## 2017-04-12 DIAGNOSIS — I129 Hypertensive chronic kidney disease with stage 1 through stage 4 chronic kidney disease, or unspecified chronic kidney disease: Secondary | ICD-10-CM | POA: Diagnosis present

## 2017-04-12 DIAGNOSIS — Z955 Presence of coronary angioplasty implant and graft: Secondary | ICD-10-CM

## 2017-04-12 DIAGNOSIS — Z7982 Long term (current) use of aspirin: Secondary | ICD-10-CM | POA: Diagnosis not present

## 2017-04-12 DIAGNOSIS — I350 Nonrheumatic aortic (valve) stenosis: Principal | ICD-10-CM | POA: Diagnosis present

## 2017-04-12 DIAGNOSIS — D649 Anemia, unspecified: Secondary | ICD-10-CM | POA: Diagnosis present

## 2017-04-12 DIAGNOSIS — Z952 Presence of prosthetic heart valve: Secondary | ICD-10-CM

## 2017-04-12 DIAGNOSIS — K219 Gastro-esophageal reflux disease without esophagitis: Secondary | ICD-10-CM | POA: Diagnosis present

## 2017-04-12 DIAGNOSIS — Z7902 Long term (current) use of antithrombotics/antiplatelets: Secondary | ICD-10-CM | POA: Diagnosis not present

## 2017-04-12 DIAGNOSIS — E785 Hyperlipidemia, unspecified: Secondary | ICD-10-CM | POA: Diagnosis present

## 2017-04-12 DIAGNOSIS — D631 Anemia in chronic kidney disease: Secondary | ICD-10-CM | POA: Diagnosis present

## 2017-04-12 DIAGNOSIS — K31819 Angiodysplasia of stomach and duodenum without bleeding: Secondary | ICD-10-CM | POA: Diagnosis present

## 2017-04-12 DIAGNOSIS — N189 Chronic kidney disease, unspecified: Secondary | ICD-10-CM | POA: Diagnosis present

## 2017-04-12 DIAGNOSIS — Z91013 Allergy to seafood: Secondary | ICD-10-CM

## 2017-04-12 DIAGNOSIS — E039 Hypothyroidism, unspecified: Secondary | ICD-10-CM | POA: Diagnosis present

## 2017-04-12 DIAGNOSIS — Z882 Allergy status to sulfonamides status: Secondary | ICD-10-CM | POA: Diagnosis not present

## 2017-04-12 DIAGNOSIS — Z006 Encounter for examination for normal comparison and control in clinical research program: Secondary | ICD-10-CM

## 2017-04-12 DIAGNOSIS — Z950 Presence of cardiac pacemaker: Secondary | ICD-10-CM

## 2017-04-12 DIAGNOSIS — I2510 Atherosclerotic heart disease of native coronary artery without angina pectoris: Secondary | ICD-10-CM | POA: Diagnosis present

## 2017-04-12 DIAGNOSIS — N184 Chronic kidney disease, stage 4 (severe): Secondary | ICD-10-CM | POA: Diagnosis present

## 2017-04-12 DIAGNOSIS — N185 Chronic kidney disease, stage 5: Secondary | ICD-10-CM | POA: Diagnosis present

## 2017-04-12 DIAGNOSIS — I5030 Unspecified diastolic (congestive) heart failure: Secondary | ICD-10-CM | POA: Diagnosis not present

## 2017-04-12 DIAGNOSIS — K31811 Angiodysplasia of stomach and duodenum with bleeding: Secondary | ICD-10-CM | POA: Diagnosis present

## 2017-04-12 HISTORY — PX: TEE WITHOUT CARDIOVERSION: SHX5443

## 2017-04-12 HISTORY — PX: TRANSCATHETER AORTIC VALVE REPLACEMENT, TRANSFEMORAL: SHX6400

## 2017-04-12 HISTORY — DX: Presence of prosthetic heart valve: Z95.2

## 2017-04-12 LAB — CBC
HCT: 30.3 % — ABNORMAL LOW (ref 36.0–46.0)
Hemoglobin: 9.6 g/dL — ABNORMAL LOW (ref 12.0–15.0)
MCH: 25.1 pg — ABNORMAL LOW (ref 26.0–34.0)
MCHC: 31.7 g/dL (ref 30.0–36.0)
MCV: 79.3 fL (ref 78.0–100.0)
PLATELETS: 145 10*3/uL — AB (ref 150–400)
RBC: 3.82 MIL/uL — AB (ref 3.87–5.11)
RDW: 14 % (ref 11.5–15.5)
WBC: 7.4 10*3/uL (ref 4.0–10.5)

## 2017-04-12 LAB — POCT I-STAT 3, ART BLOOD GAS (G3+)
Acid-base deficit: 3 mmol/L — ABNORMAL HIGH (ref 0.0–2.0)
BICARBONATE: 21.8 mmol/L (ref 20.0–28.0)
O2 Saturation: 98 %
PCO2 ART: 37.8 mmHg (ref 32.0–48.0)
PO2 ART: 113 mmHg — AB (ref 83.0–108.0)
TCO2: 23 mmol/L (ref 22–32)
pH, Arterial: 7.366 (ref 7.350–7.450)

## 2017-04-12 LAB — POCT I-STAT, CHEM 8
BUN: 47 mg/dL — ABNORMAL HIGH (ref 6–20)
BUN: 46 mg/dL — ABNORMAL HIGH (ref 6–20)
BUN: 48 mg/dL — ABNORMAL HIGH (ref 6–20)
CALCIUM ION: 1.24 mmol/L (ref 1.15–1.40)
CHLORIDE: 108 mmol/L (ref 101–111)
CHLORIDE: 106 mmol/L (ref 101–111)
CREATININE: 2.8 mg/dL — AB (ref 0.44–1.00)
Calcium, Ion: 1.21 mmol/L (ref 1.15–1.40)
Calcium, Ion: 1.18 mmol/L (ref 1.15–1.40)
Chloride: 107 mmol/L (ref 101–111)
Creatinine, Ser: 2.6 mg/dL — ABNORMAL HIGH (ref 0.44–1.00)
Creatinine, Ser: 2.7 mg/dL — ABNORMAL HIGH (ref 0.44–1.00)
GLUCOSE: 158 mg/dL — AB (ref 65–99)
Glucose, Bld: 133 mg/dL — ABNORMAL HIGH (ref 65–99)
Glucose, Bld: 177 mg/dL — ABNORMAL HIGH (ref 65–99)
HEMATOCRIT: 28 % — AB (ref 36.0–46.0)
HEMATOCRIT: 29 % — AB (ref 36.0–46.0)
HEMATOCRIT: 30 % — AB (ref 36.0–46.0)
HEMOGLOBIN: 9.5 g/dL — AB (ref 12.0–15.0)
HEMOGLOBIN: 9.9 g/dL — AB (ref 12.0–15.0)
Hemoglobin: 10.2 g/dL — ABNORMAL LOW (ref 12.0–15.0)
POTASSIUM: 4.3 mmol/L (ref 3.5–5.1)
POTASSIUM: 4.3 mmol/L (ref 3.5–5.1)
Potassium: 4.7 mmol/L (ref 3.5–5.1)
SODIUM: 140 mmol/L (ref 135–145)
SODIUM: 141 mmol/L (ref 135–145)
SODIUM: 140 mmol/L (ref 135–145)
TCO2: 22 mmol/L (ref 22–32)
TCO2: 21 mmol/L — ABNORMAL LOW (ref 22–32)
TCO2: 23 mmol/L (ref 22–32)

## 2017-04-12 LAB — PROTIME-INR
INR: 1.35
PROTHROMBIN TIME: 16.5 s — AB (ref 11.4–15.2)

## 2017-04-12 LAB — GLUCOSE, CAPILLARY
Glucose-Capillary: 127 mg/dL — ABNORMAL HIGH (ref 65–99)
Glucose-Capillary: 194 mg/dL — ABNORMAL HIGH (ref 65–99)

## 2017-04-12 LAB — POCT I-STAT 4, (NA,K, GLUC, HGB,HCT)
Glucose, Bld: 180 mg/dL — ABNORMAL HIGH (ref 65–99)
HCT: 28 % — ABNORMAL LOW (ref 36.0–46.0)
Hemoglobin: 9.5 g/dL — ABNORMAL LOW (ref 12.0–15.0)
Potassium: 4.5 mmol/L (ref 3.5–5.1)
SODIUM: 141 mmol/L (ref 135–145)

## 2017-04-12 LAB — APTT: APTT: 48 s — AB (ref 24–36)

## 2017-04-12 LAB — PREPARE RBC (CROSSMATCH)

## 2017-04-12 SURGERY — IMPLANTATION, AORTIC VALVE, TRANSCATHETER, FEMORAL APPROACH
Anesthesia: Monitor Anesthesia Care | Site: Chest

## 2017-04-12 MED ORDER — HEPARIN SODIUM (PORCINE) 1000 UNIT/ML IJ SOLN
INTRAMUSCULAR | Status: AC
  Filled 2017-04-12: qty 1

## 2017-04-12 MED ORDER — ROSUVASTATIN CALCIUM 40 MG PO TABS
40.0000 mg | ORAL_TABLET | Freq: Every day | ORAL | Status: DC
  Administered 2017-04-12 – 2017-04-13 (×2): 40 mg via ORAL
  Filled 2017-04-12: qty 2
  Filled 2017-04-12: qty 1
  Filled 2017-04-12: qty 2
  Filled 2017-04-12: qty 1

## 2017-04-12 MED ORDER — FAMOTIDINE IN NACL 20-0.9 MG/50ML-% IV SOLN
20.0000 mg | Freq: Two times a day (BID) | INTRAVENOUS | Status: DC
  Administered 2017-04-12: 20 mg via INTRAVENOUS
  Filled 2017-04-12: qty 50

## 2017-04-12 MED ORDER — SODIUM CHLORIDE 0.9 % IV SOLN
1.0000 mL/kg/h | INTRAVENOUS | Status: AC
  Administered 2017-04-12: 1 mL/kg/h via INTRAVENOUS

## 2017-04-12 MED ORDER — CHLORHEXIDINE GLUCONATE 0.12 % MT SOLN: 15.0000 mL | Freq: Once | OROMUCOSAL | Status: DC

## 2017-04-12 MED ORDER — PROPOFOL 10 MG/ML IV BOLUS
INTRAVENOUS | Status: AC
  Filled 2017-04-12: qty 20

## 2017-04-12 MED ORDER — ROCURONIUM BROMIDE 50 MG/5ML IV SOLN
INTRAVENOUS | Status: AC
  Filled 2017-04-12: qty 1

## 2017-04-12 MED ORDER — SODIUM CHLORIDE 0.9 % IV SOLN: INTRAVENOUS | Status: DC

## 2017-04-12 MED ORDER — INSULIN ASPART 100 UNIT/ML ~~LOC~~ SOLN
0.0000 [IU] | Freq: Three times a day (TID) | SUBCUTANEOUS | Status: DC
  Administered 2017-04-13: 7 [IU] via SUBCUTANEOUS
  Administered 2017-04-13 (×2): 2 [IU] via SUBCUTANEOUS
  Administered 2017-04-14: 1 [IU] via SUBCUTANEOUS

## 2017-04-12 MED ORDER — LACTATED RINGERS IV SOLN
INTRAVENOUS | Status: DC | PRN
  Administered 2017-04-12: 07:00:00 via INTRAVENOUS

## 2017-04-12 MED ORDER — AMLODIPINE BESYLATE 10 MG PO TABS
10.0000 mg | ORAL_TABLET | Freq: Every day | ORAL | Status: DC
  Administered 2017-04-13 – 2017-04-14 (×2): 10 mg via ORAL
  Filled 2017-04-12 (×2): qty 1

## 2017-04-12 MED ORDER — REMIFENTANIL HCL 1 MG IV SOLR
INTRAVENOUS | Status: DC | PRN
  Administered 2017-04-12: .05 ug/kg/min via INTRAVENOUS

## 2017-04-12 MED ORDER — SODIUM CHLORIDE 0.9% FLUSH
3.0000 mL | Freq: Two times a day (BID) | INTRAVENOUS | Status: DC
  Administered 2017-04-12 – 2017-04-14 (×4): 3 mL via INTRAVENOUS

## 2017-04-12 MED ORDER — TRAMADOL HCL 50 MG PO TABS: 50.0000 mg | ORAL_TABLET | ORAL | Status: DC | PRN

## 2017-04-12 MED ORDER — LIDOCAINE 2% (20 MG/ML) 5 ML SYRINGE
INTRAMUSCULAR | Status: AC
  Filled 2017-04-12: qty 5

## 2017-04-12 MED ORDER — CHLORHEXIDINE GLUCONATE 4 % EX LIQD: 30.0000 mL | CUTANEOUS | Status: DC

## 2017-04-12 MED ORDER — MORPHINE SULFATE (PF) 2 MG/ML IV SOLN: 2.0000 mg | INTRAVENOUS | Status: DC | PRN

## 2017-04-12 MED ORDER — IODIXANOL 320 MG/ML IV SOLN
INTRAVENOUS | Status: DC | PRN
  Administered 2017-04-12: 30.4 mL via INTRAVENOUS

## 2017-04-12 MED ORDER — CHLORHEXIDINE GLUCONATE 4 % EX LIQD: 60.0000 mL | Freq: Once | CUTANEOUS | Status: DC

## 2017-04-12 MED ORDER — FENTANYL CITRATE (PF) 100 MCG/2ML IJ SOLN
INTRAMUSCULAR | Status: DC | PRN
  Administered 2017-04-12 (×2): 50 ug via INTRAVENOUS

## 2017-04-12 MED ORDER — NITROGLYCERIN IN D5W 200-5 MCG/ML-% IV SOLN: 0.0000 ug/min | INTRAVENOUS | Status: DC

## 2017-04-12 MED ORDER — FENTANYL CITRATE (PF) 250 MCG/5ML IJ SOLN
INTRAMUSCULAR | Status: AC
  Filled 2017-04-12: qty 5

## 2017-04-12 MED ORDER — ASPIRIN 81 MG PO CHEW
81.0000 mg | CHEWABLE_TABLET | Freq: Every day | ORAL | Status: DC
  Administered 2017-04-13 – 2017-04-14 (×2): 81 mg via ORAL
  Filled 2017-04-12 (×2): qty 1

## 2017-04-12 MED ORDER — METOPROLOL TARTRATE 12.5 MG HALF TABLET
12.5000 mg | ORAL_TABLET | Freq: Two times a day (BID) | ORAL | Status: DC
Start: 2017-04-12 — End: 2017-04-14
  Administered 2017-04-13 – 2017-04-14 (×3): 12.5 mg via ORAL
  Filled 2017-04-12 (×3): qty 1

## 2017-04-12 MED ORDER — 0.9 % SODIUM CHLORIDE (POUR BTL) OPTIME
TOPICAL | Status: DC | PRN
  Administered 2017-04-12: 1000 mL

## 2017-04-12 MED ORDER — SODIUM CHLORIDE 0.9 % IV SOLN: 250.0000 mL | INTRAVENOUS | Status: DC | PRN

## 2017-04-12 MED ORDER — OXYCODONE HCL 5 MG PO TABS: 5.0000 mg | ORAL_TABLET | ORAL | Status: DC | PRN

## 2017-04-12 MED ORDER — SODIUM CHLORIDE 0.9% FLUSH: 3.0000 mL | INTRAVENOUS | Status: DC | PRN

## 2017-04-12 MED ORDER — MIDAZOLAM HCL 2 MG/2ML IJ SOLN: 2.0000 mg | INTRAMUSCULAR | Status: DC | PRN

## 2017-04-12 MED ORDER — METOPROLOL TARTRATE 5 MG/5ML IV SOLN: 2.5000 mg | INTRAVENOUS | Status: DC | PRN

## 2017-04-12 MED ORDER — ALBUMIN HUMAN 5 % IV SOLN: 250.0000 mL | INTRAVENOUS | Status: AC | PRN

## 2017-04-12 MED ORDER — CHLORHEXIDINE GLUCONATE 0.12 % MT SOLN
15.0000 mL | OROMUCOSAL | Status: AC
  Administered 2017-04-12: 15 mL via OROMUCOSAL

## 2017-04-12 MED ORDER — PANTOPRAZOLE SODIUM 40 MG PO TBEC
40.0000 mg | DELAYED_RELEASE_TABLET | Freq: Every day | ORAL | Status: DC
  Administered 2017-04-13 – 2017-04-14 (×2): 40 mg via ORAL
  Filled 2017-04-12 (×2): qty 1

## 2017-04-12 MED ORDER — CLOPIDOGREL BISULFATE 75 MG PO TABS
75.0000 mg | ORAL_TABLET | Freq: Every day | ORAL | Status: DC
Start: 2017-04-13 — End: 2017-04-14
  Administered 2017-04-13 – 2017-04-14 (×2): 75 mg via ORAL
  Filled 2017-04-12 (×2): qty 1

## 2017-04-12 MED ORDER — INSULIN ASPART 100 UNIT/ML ~~LOC~~ SOLN: 0.0000 [IU] | Freq: Every day | SUBCUTANEOUS | Status: DC

## 2017-04-12 MED ORDER — ONDANSETRON HCL 4 MG/2ML IJ SOLN: 4.0000 mg | Freq: Four times a day (QID) | INTRAMUSCULAR | Status: DC | PRN

## 2017-04-12 MED ORDER — PHENYLEPHRINE 40 MCG/ML (10ML) SYRINGE FOR IV PUSH (FOR BLOOD PRESSURE SUPPORT)
PREFILLED_SYRINGE | INTRAVENOUS | Status: AC
  Filled 2017-04-12: qty 10

## 2017-04-12 MED ORDER — PHENYLEPHRINE HCL 10 MG/ML IJ SOLN
0.0000 ug/min | INTRAMUSCULAR | Status: DC
  Filled 2017-04-12: qty 2

## 2017-04-12 MED ORDER — HEPARIN SODIUM (PORCINE) 1000 UNIT/ML IJ SOLN
INTRAMUSCULAR | Status: DC | PRN
  Administered 2017-04-12: 12000 [IU] via INTRAVENOUS

## 2017-04-12 MED ORDER — PROTAMINE SULFATE 10 MG/ML IV SOLN
INTRAVENOUS | Status: DC | PRN
  Administered 2017-04-12: 140 mg via INTRAVENOUS

## 2017-04-12 MED ORDER — LIDOCAINE HCL (PF) 1 % IJ SOLN
INTRAMUSCULAR | Status: AC
  Filled 2017-04-12: qty 30

## 2017-04-12 MED ORDER — PROTAMINE SULFATE 10 MG/ML IV SOLN
INTRAVENOUS | Status: AC
  Filled 2017-04-12: qty 25

## 2017-04-12 MED ORDER — DEXTROSE 5 % IV SOLN
1.5000 g | Freq: Two times a day (BID) | INTRAVENOUS | Status: DC
  Administered 2017-04-12 – 2017-04-13 (×2): 1.5 g via INTRAVENOUS
  Filled 2017-04-12 (×3): qty 1.5

## 2017-04-12 MED ORDER — MORPHINE SULFATE (PF) 4 MG/ML IV SOLN
2.0000 mg | INTRAVENOUS | Status: DC | PRN
  Administered 2017-04-12: 4 mg via INTRAVENOUS
  Filled 2017-04-12: qty 1

## 2017-04-12 MED ORDER — VANCOMYCIN HCL IN DEXTROSE 1-5 GM/200ML-% IV SOLN
1000.0000 mg | Freq: Once | INTRAVENOUS | Status: AC
  Administered 2017-04-12: 1000 mg via INTRAVENOUS
  Filled 2017-04-12: qty 200

## 2017-04-12 MED ORDER — SODIUM CHLORIDE 0.9 % IV SOLN
0.0125 ug/kg/min | INTRAVENOUS | Status: DC
  Filled 2017-04-12: qty 2000

## 2017-04-12 MED ORDER — LACTATED RINGERS IV SOLN: 500.0000 mL | Freq: Once | INTRAVENOUS | Status: DC | PRN

## 2017-04-12 MED ORDER — LIDOCAINE HCL 1 % IJ SOLN
INTRAMUSCULAR | Status: DC | PRN
  Administered 2017-04-12: 14 mL via INTRADERMAL

## 2017-04-12 MED ORDER — LEVOTHYROXINE SODIUM 100 MCG PO TABS
100.0000 ug | ORAL_TABLET | Freq: Every day | ORAL | Status: DC
  Administered 2017-04-13 – 2017-04-14 (×2): 100 ug via ORAL
  Filled 2017-04-12 (×2): qty 1

## 2017-04-12 MED ORDER — SODIUM CHLORIDE 0.9 % IV SOLN
INTRAVENOUS | Status: DC | PRN
  Administered 2017-04-12: 500 mL

## 2017-04-12 MED ORDER — ONDANSETRON HCL 4 MG/2ML IJ SOLN
INTRAMUSCULAR | Status: AC
  Filled 2017-04-12: qty 2

## 2017-04-12 MED ORDER — PHENYLEPHRINE HCL 10 MG/ML IJ SOLN
INTRAMUSCULAR | Status: DC | PRN
  Administered 2017-04-12: 25 ug/min via INTRAVENOUS

## 2017-04-12 SURGICAL SUPPLY — 112 items
ADAPTER UNIV SWAN GANZ BIP (ADAPTER) ×2 IMPLANT
ADAPTER UNV SWAN GANZ BIP (ADAPTER) ×2
ATTRACTOMAT 16X20 MAGNETIC DRP (DRAPES) IMPLANT
BAG BANDED W/RUBBER/TAPE 36X54 (MISCELLANEOUS) ×4 IMPLANT
BAG DECANTER FOR FLEXI CONT (MISCELLANEOUS) IMPLANT
BAG SNAP BAND KOVER 36X36 (MISCELLANEOUS) ×8 IMPLANT
BLADE 10 SAFETY STRL DISP (BLADE) ×4 IMPLANT
BLADE CLIPPER SURG (BLADE) IMPLANT
BLADE STERNUM SYSTEM 6 (BLADE) ×4 IMPLANT
CABLE ADAPT CONN TEMP 6FT (ADAPTER) ×4 IMPLANT
CABLE PACING FASLOC BIEGE (MISCELLANEOUS) ×4 IMPLANT
CABLE PACING FASLOC BLUE (MISCELLANEOUS) ×4 IMPLANT
CANISTER SUCT 3000ML PPV (MISCELLANEOUS) IMPLANT
CANNULA FEM VENOUS REMOTE 22FR (CANNULA) IMPLANT
CANNULA OPTISITE PERFUSION 16F (CANNULA) IMPLANT
CANNULA OPTISITE PERFUSION 18F (CANNULA) IMPLANT
CATH DIAG EXPO 6F VENT PIG 145 (CATHETERS) ×8 IMPLANT
CATH EXPO 5FR AL1 (CATHETERS) ×4 IMPLANT
CATH S G BIP PACING (SET/KITS/TRAYS/PACK) ×8 IMPLANT
CATHETER LAUNCHER 6FR AL2 SH (CATHETERS) ×4 IMPLANT
CLIP VESOCCLUDE MED 24/CT (CLIP) ×4 IMPLANT
CLIP VESOCCLUDE SM WIDE 24/CT (CLIP) ×4 IMPLANT
CONT SPEC 4OZ CLIKSEAL STRL BL (MISCELLANEOUS) ×8 IMPLANT
COVER BACK TABLE 60X90IN (DRAPES) ×4 IMPLANT
COVER BACK TABLE 80X110 HD (DRAPES) ×4 IMPLANT
COVER DOME SNAP 22 D (MISCELLANEOUS) ×4 IMPLANT
COVER MAYO STAND STRL (DRAPES) IMPLANT
CRADLE DONUT ADULT HEAD (MISCELLANEOUS) ×4 IMPLANT
DERMABOND ADVANCED (GAUZE/BANDAGES/DRESSINGS) ×2
DERMABOND ADVANCED .7 DNX12 (GAUZE/BANDAGES/DRESSINGS) ×2 IMPLANT
DEVICE CLOSURE PERCLS PRGLD 6F (VASCULAR PRODUCTS) ×4 IMPLANT
DRAPE INCISE IOBAN 66X45 STRL (DRAPES) IMPLANT
DRAPE SLUSH MACHINE 52X66 (DRAPES) ×4 IMPLANT
DRSG TEGADERM 4X4.75 (GAUZE/BANDAGES/DRESSINGS) ×8 IMPLANT
ELECT REM PT RETURN 9FT ADLT (ELECTROSURGICAL) ×8
ELECTRODE REM PT RTRN 9FT ADLT (ELECTROSURGICAL) ×4 IMPLANT
FELT TEFLON 6X6 (MISCELLANEOUS) ×4 IMPLANT
FEMORAL VENOUS CANN RAP (CANNULA) IMPLANT
GAUZE SPONGE 4X4 12PLY STRL (GAUZE/BANDAGES/DRESSINGS) ×4 IMPLANT
GAUZE SPONGE 4X4 12PLY STRL LF (GAUZE/BANDAGES/DRESSINGS) ×4 IMPLANT
GLIDESHEATH SLEND SS 6F .021 (SHEATH) ×4 IMPLANT
GLOVE BIO SURGEON STRL SZ7.5 (GLOVE) ×4 IMPLANT
GLOVE BIO SURGEON STRL SZ8 (GLOVE) ×8 IMPLANT
GLOVE EUDERMIC 7 POWDERFREE (GLOVE) ×4 IMPLANT
GLOVE ORTHO TXT STRL SZ7.5 (GLOVE) ×4 IMPLANT
GOWN STRL REUS W/ TWL LRG LVL3 (GOWN DISPOSABLE) ×6 IMPLANT
GOWN STRL REUS W/ TWL XL LVL3 (GOWN DISPOSABLE) ×12 IMPLANT
GOWN STRL REUS W/TWL LRG LVL3 (GOWN DISPOSABLE) ×6
GOWN STRL REUS W/TWL XL LVL3 (GOWN DISPOSABLE) ×12
GUIDEWIRE INQWIRE 1.5J.035X260 (WIRE) ×2 IMPLANT
GUIDEWIRE SAF TJ AMPL .035X180 (WIRE) ×4 IMPLANT
GUIDEWIRE SAFE TJ AMPLATZ EXST (WIRE) ×4 IMPLANT
GUIDEWIRE STRAIGHT .035 260CM (WIRE) ×4 IMPLANT
GUIDEWIRE WHOLEY .035 145 JTIP (WIRE) ×4 IMPLANT
INQWIRE 1.5J .035X260CM (WIRE) ×4
INSERT FOGARTY 61MM (MISCELLANEOUS) IMPLANT
INSERT FOGARTY SM (MISCELLANEOUS) IMPLANT
INSERT FOGARTY XLG (MISCELLANEOUS) IMPLANT
KIT BASIN OR (CUSTOM PROCEDURE TRAY) ×4 IMPLANT
KIT DILATOR VASC 18G NDL (KITS) IMPLANT
KIT ENCORE 26 ADVANTAGE (KITS) ×4 IMPLANT
KIT HEART LEFT (KITS) ×4 IMPLANT
KIT ROOM TURNOVER OR (KITS) ×4 IMPLANT
KIT SUCTION CATH 14FR (SUCTIONS) ×8 IMPLANT
NEEDLE HYPO 25GX1X1/2 BEV (NEEDLE) ×4 IMPLANT
NEEDLE PERC 18GX7CM (NEEDLE) ×4 IMPLANT
NS IRRIG 1000ML POUR BTL (IV SOLUTION) ×16 IMPLANT
PACK AORTA (CUSTOM PROCEDURE TRAY) ×4 IMPLANT
PAD ARMBOARD 7.5X6 YLW CONV (MISCELLANEOUS) ×8 IMPLANT
PAD ELECT DEFIB RADIOL ZOLL (MISCELLANEOUS) ×4 IMPLANT
PATCH TACHOSII LRG 9.5X4.8 (VASCULAR PRODUCTS) IMPLANT
PERCLOSE PROGLIDE 6F (VASCULAR PRODUCTS) ×8
SET MICROPUNCTURE 5F STIFF (MISCELLANEOUS) ×4 IMPLANT
SHEATH AVANTI 11CM 8FR (MISCELLANEOUS) ×4 IMPLANT
SHEATH BRITE TIP 6FR 35CM (SHEATH) ×4 IMPLANT
SHEATH PINNACLE 6F 10CM (SHEATH) ×8 IMPLANT
SLEEVE REPOSITIONING LENGTH 30 (MISCELLANEOUS) ×4 IMPLANT
SPONGE LAP 4X18 X RAY DECT (DISPOSABLE) ×4 IMPLANT
STOPCOCK MORSE 400PSI 3WAY (MISCELLANEOUS) ×16 IMPLANT
SUT ETHIBOND X763 2 0 SH 1 (SUTURE) IMPLANT
SUT GORETEX CV 4 TH 22 36 (SUTURE) IMPLANT
SUT GORETEX CV4 TH-18 (SUTURE) IMPLANT
SUT GORETEX TH-18 36 INCH (SUTURE) IMPLANT
SUT MNCRL AB 3-0 PS2 18 (SUTURE) IMPLANT
SUT PROLENE 3 0 SH1 36 (SUTURE) IMPLANT
SUT PROLENE 4 0 RB 1 (SUTURE)
SUT PROLENE 4-0 RB1 .5 CRCL 36 (SUTURE) IMPLANT
SUT PROLENE 5 0 C 1 36 (SUTURE) IMPLANT
SUT PROLENE 6 0 C 1 30 (SUTURE) IMPLANT
SUT SILK  1 MH (SUTURE) ×2
SUT SILK 1 MH (SUTURE) ×2 IMPLANT
SUT SILK 2 0 SH CR/8 (SUTURE) IMPLANT
SUT VIC AB 2-0 CT1 27 (SUTURE)
SUT VIC AB 2-0 CT1 TAPERPNT 27 (SUTURE) IMPLANT
SUT VIC AB 2-0 CTX 36 (SUTURE) IMPLANT
SUT VIC AB 3-0 SH 8-18 (SUTURE) IMPLANT
SYR 10ML LL (SYRINGE) ×12 IMPLANT
SYR 30ML LL (SYRINGE) ×8 IMPLANT
SYR 50ML LL SCALE MARK (SYRINGE) ×4 IMPLANT
SYR CONTROL 10ML LL (SYRINGE) ×4 IMPLANT
TAPE CLOTH SURG 4X10 WHT LF (GAUZE/BANDAGES/DRESSINGS) ×4 IMPLANT
TOWEL OR 17X26 10 PK STRL BLUE (TOWEL DISPOSABLE) ×8 IMPLANT
TRANSDUCER W/STOPCOCK (MISCELLANEOUS) ×8 IMPLANT
TRAY FOLEY SILVER 14FR TEMP (SET/KITS/TRAYS/PACK) ×4 IMPLANT
TUBE SUCT INTRACARD DLP 20F (MISCELLANEOUS) IMPLANT
TUBING ART PRESS 72  MALE/FEM (TUBING) ×2
TUBING ART PRESS 72 MALE/FEM (TUBING) ×2 IMPLANT
TUBING HIGH PRESSURE 120CM (CONNECTOR) ×4 IMPLANT
VALVE HEART TRANSCATH SZ3 23MM (Prosthesis & Implant Heart) ×4 IMPLANT
WIRE AMPLATZ SS-J .035X180CM (WIRE) ×4 IMPLANT
WIRE BENTSON .035X145CM (WIRE) ×4 IMPLANT
WIRE COUGAR XT STRL 190CM (WIRE) ×4 IMPLANT

## 2017-04-12 NOTE — Progress Notes (Signed)
TCTS BRIEF SICU PROGRESS NOTE  Day of Surgery  S/P Procedure(s) (LRB): TRANSCATHETER AORTIC VALVE REPLACEMENT, TRANSFEMORAL (N/A) TRANSESOPHAGEAL ECHOCARDIOGRAM (TEE) (N/A)   Doing well.  No complaints NSR w/ stable BP O2 sats 100% Both groins okay  Plan: Continue routine post TAVR  Rexene Alberts, MD 04/12/2017 5:23 PM

## 2017-04-12 NOTE — Op Note (Signed)
HEART AND VASCULAR CENTER   MULTIDISCIPLINARY HEART VALVE TEAM   TAVR OPERATIVE NOTE   Date of Procedure:  04/12/2017  Preoperative Diagnosis: Severe Aortic Stenosis   Postoperative Diagnosis: Same   Procedure:    Transcatheter Aortic Valve Replacement - Percutaneous Right Transfemoral Approach  Edwards Sapien 3 THV (size 23 mm, model # 9600TFX, serial # [id number])   Co-Surgeons:  Valentina Gu. Roxy Manns, MD and [name], MD  Anesthesiologist:  Midge Minium, MD  Echocardiographer:  Ena Dawley, MD  Pre-operative Echo Findings:  Severe aortic stenosis  Normal left ventricular systolic function  Post-operative Echo Findings:  No paravalvular leak  Normal left ventricular systolic function   BRIEF CLINICAL NOTE AND INDICATIONS FOR SURGERY  The patient is an 83 year old woman with hypertension, hyperlipidemia, hypothyroidism, type 2 DM, stage IV CKD, CAD s/p PCI/DES to the mid RCA in 01/2017, s/p PPM, and anemia who had been followed by Dr. Lewayne Bunting in [facility]. She was in her usual state of health until 02/13/2017 when she presented with new onset chest pain while going up 3 flights of stairs. It was associated with shortness of breath and lightheadedness. It resolved after a few minutes but returned and she came to the [facility] ED. She ruled out for MI. Her Hgb was 7.9 and was 11.8 in May. She was transfused 2 units and had heme positive stool. She had a Lexiscan Myoview on 8/20 which was low risk with inferior scar and minimal peri-infarct ischemia. LVEF was 48%. A 2D echo showed an LVEF of 55-60% with severe AS with a mean gradient of 61 mm Hg and peak of 93 mm Hg. She underwent cath on 8/22 showing 90% mid RCA stenosis and a 70% distal LCX stenosis supplying a small OM3. EGD on 8/24 showed a single small, oozing AVM treated with APC. She then underwent PCI with orbital atherectomy and DES of the RCA on 02/23/2017. Since discharge she has been feeling ok with no  significant SOB or chest discomfort but she is not doing much.  During the course of the patient's preoperative work up they have been evaluated comprehensively by a multidisciplinary team of specialists coordinated through the [facility] in the [facility].  They have been demonstrated to suffer from symptomatic severe aortic stenosis as noted above. The patient has been counseled extensively as to the relative risks and benefits of all options for the treatment of severe aortic stenosis including long term medical therapy, conventional surgery for aortic valve replacement, and transcatheter aortic valve replacement.  All questions have been answered, and the patient provides full informed consent for the operation as described.   DETAILS OF THE OPERATIVE PROCEDURE  PREPARATION:    The patient is brought to the operating room on the above mentioned date and central monitoring was established by the anesthesia team including placement of a central venous line and radial arterial line. The patient is placed in the supine position on the operating table.  Intravenous antibiotics are administered. The patient is monitored closely throughout the procedure under conscious sedation.    Baseline transthoracic echocardiogram was performed. The patient's chest, abdomen, both groins, and both lower extremities are prepared and draped in a sterile manner. A time out procedure is performed.   PERIPHERAL ACCESS:    Using the modified Seldinger technique, femoral arterial and venous access was obtained with placement of 6 Fr sheaths on the left side.  Left radial access was obtained  and a pigtail diagnostic catheter was passed through the left radial sheath under fluoroscopic guidance into the aortic root.  A temporary transvenous pacemaker catheter was passed through the left femoral venous sheath under fluoroscopic guidance into the right ventricle.  The  pacemaker was tested to ensure stable lead placement and pacemaker capture. Aortic root angiography was performed in order to determine the optimal angiographic angle for valve deployment.   TRANSFEMORAL ACCESS:   Percutaneous transfemoral access and sheath placement was performed by Dr. Burt Knack using ultrasound guidance.  The right common femoral artery was cannulated using a micropuncture needle and appropriate location was verified using hand injection angiogram.  A pair of Abbott Perclose percutaneous closure devices were placed and a 6 French sheath replaced into the femoral artery.  The patient was heparinized systemically and ACT verified > 250 seconds.    A 14 Fr transfemoral E-sheath was introduced into the right common femoral artery after progressively dilating over an Amplatz superstiff wire. An AL-1 catheter was used to direct a straight-tip exchange length wire across the native aortic valve into the left ventricle. This was exchanged out for a pigtail catheter and position was confirmed in the LV apex. Simultaneous LV and Ao pressures were recorded.  The pigtail catheter was exchanged for an Amplatz Extra-stiff wire in the LV apex.  Echocardiography was utilized to confirm appropriate wire position and no sign of entanglement in the mitral subvalvular apparatus.   LEFT MAIN CORONARY PROTECTION:    Because of concerns regarding the relatively small size of the patient's aortic root and the close proximity of the left main coronary artery to the aortic annulus, left main coronary artery protection is employed.  The details of this portion of the procedure are dictated separately by Dr. Burt Knack.  Briefly, a left coronary guide catheter is placed from the left femoral artery sheath and positioned in the left main coronary artery.  A 0.014 cougar guidewire is passed through the left main down the left anterior descending coronary artery. A 12 x 4 mm drug-eluting stent is passed over the guidewire  and positioned in the proximal left anterior descending coronary artery. The left coronary guide catheter is pulled back so as not to remaining gauged in the left main coronary artery.   TRANSCATHETER HEART VALVE DEPLOYMENT:   An Edwards Sapien 3 transcatheter heart valve (size 23 mm, model #9600TFX, serial #[id number]) was prepared and crimped per manufacturer's guidelines, and the proper orientation of the valve is confirmed on the [employer] delivery system. The valve was advanced through the introducer sheath using normal technique until in an appropriate position in the abdominal aorta beyond the sheath tip. The balloon was then retracted and using the fine-tuning wheel was centered on the valve. The valve was then advanced across the aortic arch using appropriate flexion of the catheter. The valve was carefully positioned across the aortic valve annulus. The Commander catheter was retracted using normal technique. Once final position of the valve has been confirmed by angiographic assessment, the valve is deployed while temporarily holding ventilation and during rapid ventricular pacing to maintain systolic blood pressure < 50 mmHg and pulse pressure < 10 mmHg. The balloon inflation is held for >3 seconds after reaching full deployment volume. Once the balloon has fully deflated the balloon is retracted into the ascending aorta and valve function is assessed using echocardiography. There is felt to be no paravalvular leak and no central aortic insufficiency.  The patient's hemodynamic recovery following valve deployment is good.  The deployment balloon and guidewire are both removed.  The left coronary stent is pulled back into the left coronary guide and both the guide and the stent are removed from the patient uneventfully.   PROCEDURE COMPLETION:   The sheath was removed and femoral artery closure performed by Dr Burt Knack.  Protamine was administered once femoral arterial repair was complete.  The temporary pacemaker, pigtail catheters and femoral sheaths were removed with manual pressure used for hemostasis.   The patient tolerated the procedure well and is transported to the surgical intensive care in stable condition. There were no immediate intraoperative complications. All sponge instrument and needle counts are verified correct at completion of the operation.   No blood products were administered during the operation.  The patient received a total of 30.4 mL of intravenous contrast during the procedure.   Rexene Alberts, MD 04/12/2017 10:58 AM

## 2017-04-12 NOTE — Anesthesia Postprocedure Evaluation (Signed)
Anesthesia Post Note  Patient: Molly Cobb  Procedure(s) Performed: TRANSCATHETER AORTIC VALVE REPLACEMENT, TRANSFEMORAL (N/A Chest) TRANSESOPHAGEAL ECHOCARDIOGRAM (TEE) (N/A )     Patient location during evaluation: SICU Anesthesia Type: MAC Level of consciousness: awake and alert, patient cooperative and oriented Pain management: pain level controlled Vital Signs Assessment: post-procedure vital signs reviewed and stable Respiratory status: spontaneous breathing, nonlabored ventilation, respiratory function stable and patient connected to nasal cannula oxygen Cardiovascular status: blood pressure returned to baseline and stable Postop Assessment: no apparent nausea or vomiting and adequate PO intake Anesthetic complications: no    Last Vitals:  Vitals:   04/12/17 1700 04/12/17 1800  BP: 120/62 124/69  Pulse: 81 85  Resp: 15 15  Temp:    SpO2: 100% 100%    Last Pain:  Vitals:   04/12/17 0616  TempSrc: Oral                 Ilsa Bonello,E. Teyonna Plaisted

## 2017-04-12 NOTE — H&P (View-Only) (Signed)
Patient ID: Molly Cobb, female   DOB: 07/14/1931, 83 y.o.   MRN: [id number]   [facility] CONSULTATION REPORT  Referring Provider is [name], MD PCP is Guadlupe Spanish, MD  Chief Complaint  Patient presents with  . Aortic Stenosis    for 2nd TAVR, review studies and schedule surgery    HPI:  The patient is an 83 year old woman with hypertension, hyperlipidemia, hypothyroidism, type 2 DM, stage IV CKD, CAD s/p PCI/DES to the mid RCA in 01/2017, s/p PPM, and anemia who had been followed by Dr. Lewayne Bunting in [facility]. She was in her usual state of health until 02/13/2017 when she presented with new onset chest pain while going up 3 flights of stairs. It was associated with shortness of breath and lightheadedness. It resolved after a few minutes but returned and she came to the [facility] ED. She ruled out for MI. Her Hgb was 7.9 and was 11.8 in May. She was transfused 2 units and had heme positive stool. She had a Lexiscan Myoview on 8/20 which was low risk with inferior scar and minimal peri-infarct ischemia. LVEF was 48%. A 2D echo showed an LVEF of 55-60% with severe AS with a mean gradient of 61 mm Hg and peak of 93 mm Hg. She underwent cath on 8/22 showing 90% mid RCA stenosis and a 70% distal LCX stenosis supplying a small OM3. EGD on 8/24 showed a single small, oozing AVM treated with APC. She then underwent PCI with orbital atherectomy and DES of the RCA on 02/23/2017. Since discharge she has been feeling ok with no significant SOB or chest discomfort but she is not doing much.  Past Medical History:  Diagnosis Date  . CKD (chronic kidney disease)    a. followed by Dr. Neta Ehlers at [facility]   . Coronary artery disease    02/23/17 PCI/DES to mRCA,  normal EF  . Depression   . Diabetes mellitus ([place])   . Gout   . Hypertension   . Presence of permanent cardiac pacemaker    a. followed by [facility]  . Severe  aortic stenosis    a. diagnosed 01/2017 during admission for chest pain.   . Thyroid disease     Past Surgical History:  Procedure Laterality Date  . CORONARY ATHERECTOMY N/A 02/23/2017   Procedure: CORONARY ATHERECTOMY;  Surgeon: [name], MD;  Location: [place] [facility];  Service: Cardiovascular;  Laterality: N/A;  . ESOPHAGOGASTRODUODENOSCOPY N/A 02/18/2017   Procedure: ESOPHAGOGASTRODUODENOSCOPY (EGD);  Surgeon: Milus Banister, MD;  Location: [facility] ENDOSCOPY;  Service: Endoscopy;  Laterality: N/A;  . FEMUR FRACTURE SURGERY    . HOT HEMOSTASIS N/A 02/18/2017   Procedure: HOT HEMOSTASIS (ARGON PLASMA COAGULATION/BICAP);  Surgeon: Milus Banister, MD;  Location: [facility] ENDOSCOPY;  Service: Endoscopy;  Laterality: N/A;  . IR RADIOLOGY PERIPHERAL GUIDED IV START  03/07/2017  . IR US GUIDE VASC ACCESS RIGHT  03/07/2017  . RIGHT/LEFT HEART CATH AND CORONARY ANGIOGRAPHY N/A 02/16/2017   Procedure: RIGHT/LEFT HEART CATH AND CORONARY ANGIOGRAPHY;  Surgeon: Wellington Hampshire, MD;  Location: [place] [facility];  Service: Cardiovascular;  Laterality: N/A;  . ROTATOR CUFF REPAIR      Family History  Problem Relation Age of Onset  . Heart disease Neg Hx   . Stroke Neg Hx   . Diabetes Neg Hx     Social History   Social History  . Marital status:  Widowed    Spouse name: N/A  . Number of children: N/A  . Years of education: N/A   Occupational History  . Not on file.   Social History Main Topics  . Smoking status: Never Smoker  . Smokeless tobacco: Never Used  . Alcohol use No  . Drug use: No  . Sexual activity: Not on file   Other Topics Concern  . Not on file   Social History Narrative  . No narrative on file    Current Outpatient Prescriptions  Medication Sig Dispense Refill  . amLODipine (NORVASC) 10 MG tablet Take 10 mg by mouth daily.    Marland Kitchen aspirin 81 MG chewable tablet Chew 1 tablet (81 mg total) by mouth daily. 30 tablet 0  . clopidogrel (PLAVIX) 75 MG tablet Take 1  tablet (75 mg total) by mouth daily. 90 tablet 1  . levothyroxine (SYNTHROID, LEVOTHROID) 100 MCG tablet Take 100 mcg by mouth daily before breakfast.    . metoprolol tartrate (LOPRESSOR) 25 MG tablet Take 0.5 tablets (12.5 mg total) by mouth 2 (two) times daily. 90 tablet 0  . nitroGLYCERIN (NITROSTAT) 0.4 MG SL tablet Place 1 tablet (0.4 mg total) under the tongue every 5 (five) minutes as needed. 25 tablet 2  . pantoprazole (PROTONIX) 40 MG tablet Take 1 tablet (40 mg total) by mouth daily. 30 tablet 0  . rosuvastatin (CRESTOR) 40 MG tablet Take 1 tablet (40 mg total) by mouth at bedtime. 90 tablet 1  . sodium bicarbonate 650 MG tablet Take 650 mg by mouth 2 (two) times daily.      No current facility-administered medications for this visit.     Allergies  Allergen Reactions  . Sulfa Antibiotics Nausea And Vomiting      Review of Systems:   General:  normal appetite, decreased energy, some weight gain, no weight loss, no fever  Cardiac:  no chest pain with exertion, no chest pain at rest, no SOB with  exertion, no resting SOB, no PND, no orthopnea, no palpitations, no arrhythmia, no atrial fibrillation, mild LE edema, no dizzy spells, no syncope  Respiratory:  no shortness of breath, no home oxygen, no productive cough, has dry cough, no bronchitis, no wheezing, no hemoptysis, no asthma, no pain with inspiration or cough, no sleep apnea, no CPAP at night  GI:   no difficulty swallowing, no reflux, no frequent heartburn, no hiatal hernia, no abdominal pain, no constipation, no diarrhea, no hematochezia, no hematemesis, no melena  GU:   no dysuria,  no frequency, no urinary tract infection, no hematuria,  no kidney stones, no kidney disease  Vascular:  no pain suggestive of claudication, no pain in feet, no leg cramps, no varicose veins, no DVT, no non-healing foot ulcer  Neuro:   no stroke, no TIA's, no seizures, no headaches, no temporary blindness one eye,  no slurred speech, no  peripheral neuropathy, no chronic pain, no instability of gait, no memory/cognitive dysfunction  Musculoskeletal: no arthritis, no joint swelling, no myalgias, no difficulty walking, normal mobility   Skin:   no rash, no itching, no skin infections, no pressure sores or ulcerations  Psych:   no anxiety, no depression, no nervousness, no unusual recent stress  Eyes:   no blurry vision, no floaters, no recent vision changes,  wears glasses or contacts  ENT:   no hearing loss, no loose or painful teeth, complete dentures   Hematologic:  no easy bruising, no abnormal bleeding, no clotting disorder, no frequent  epistaxis  Endocrine:  has diabetes, does  check CBG's at home        Physical Exam:   BP 132/76 (BP Location: Right Arm, Patient Position: Sitting, Cuff Size: Large)   Pulse 82   Resp 16   Ht 5\' 3"  (1.6 m)   Wt 187 lb (84.8 kg)   SpO2 99% Comment: ON RA  BMI 33.13 kg/m   General:  Obese,  well-appearing  HEENT:  Unremarkable, NCAT, PERLA, EOMI, oropharynx clear  Neck:   no JVD, no bruits, no adenopathy or thyromegaly  Chest:   clear to auscultation, symmetrical breath sounds, no wheezes, no rhonchi   CV:   RRR, grade lll/VI crescendo/decrescendo murmur heard best at RSB,  no diastolic murmur  Abdomen:  soft, non-tender, no masses or organomegaly  Extremities:  warm, well-perfused, pulses diminished, no LE edema  Rectal/GU  Deferred  Neuro:   Grossly non-focal and symmetrical throughout  Skin:   Clean and dry, no rashes, no breakdown   Diagnostic Tests:            *[place]*                   *[facility]*                         [address], [place] [address]                            [phone]  ------------------------------------------------------------------- Transthoracic Echocardiography  Patient:    Itsel, Opfer MR #:       [id number] Study Date: 02/15/2017 Gender:     F Age:        54 Height:     160  cm Weight:     85.4 kg BSA:        1.98 m^2 Pt. Status: Room:       [place]  Tresa Res, RDCS  ATTENDING    Nita Sells [id number]  Delano Metz, Dayna N  REFERRING    Dunn, Dayna N  PERFORMING   [facility], Inpatient  ADMITTING    Myrene Buddy P  cc:  ------------------------------------------------------------------- LV EF: 55% -   60%  ------------------------------------------------------------------- Indications:      Murmur 785.2.  ------------------------------------------------------------------- History:   Risk factors:  Hypertension. Diabetes mellitus.  ------------------------------------------------------------------- Study Conclusions  - Left ventricle: The cavity size was normal. Wall thickness was   increased in a pattern of mild LVH. Systolic function was normal.   The estimated ejection fraction was in the range of 55% to 60%.   Wall motion was normal; there were no regional wall motion   abnormalities. Doppler parameters are consistent with abnormal   left ventricular relaxation (grade 1 diastolic dysfunction). - Aortic valve: Trileaflet; severely calcified leaflets. There was   severe stenosis. There was mild regurgitation. Mean gradient (S):   61 mm Hg. Peak gradient (S): 93 mm Hg. Valve area (VTI): 0.44   cm^2. - Mitral valve: Moderately calcified annulus. There was no   significant regurgitation. - Left atrium: The atrium was mildly dilated. - Right ventricle: The cavity size was normal. Pacer wire or   catheter noted in right ventricle. Systolic function was normal. - Right atrium:  The atrium was mildly dilated. - Tricuspid valve: Peak RV-RA gradient (S): 22 mm Hg. - Pulmonary arteries: PA peak pressure: 25 mm Hg (S). - Inferior vena cava: The vessel was normal in size. The   respirophasic diameter changes were in the normal range (>= 50%),   consistent with normal central venous pressure.  Impressions:  -  Normal LV size with mild LV hypertrophy. EF 55-60%. Normal RV   size and systolic function. Severe aortic stenosis with mild   aortic insufficiency.  ------------------------------------------------------------------- Labs, prior tests, procedures, and surgery: Permanent pacemaker system implantation.  ------------------------------------------------------------------- Study data:   Study status:  Routine.  Study completion:  There were no complications.          Transthoracic echocardiography. M-mode, complete 2D, spectral Doppler, and color Doppler. Birthdate:  Patient birthdate: September 27, 1931.  Age:  Patient is 83 yr old.  Sex:  Gender: female.    BMI: 33.3 kg/m^2.  Blood pressure:   146/53  Patient status:  Inpatient.  Study date:  Study date: 02/15/2017. Study time: 10:13 AM.  Location:  Bedside.  -------------------------------------------------------------------  ------------------------------------------------------------------- Left ventricle:  The cavity size was normal. Wall thickness was increased in a pattern of mild LVH. Systolic function was normal. The estimated ejection fraction was in the range of 55% to 60%. Wall motion was normal; there were no regional wall motion abnormalities. Doppler parameters are consistent with abnormal left ventricular relaxation (grade 1 diastolic dysfunction).  ------------------------------------------------------------------- Aortic valve:   Trileaflet; severely calcified leaflets.  Doppler:  There was severe stenosis.   There was mild regurgitation.    VTI ratio of LVOT to aortic valve: 0.19. Valve area (VTI): 0.44 cm^2. Indexed valve area (VTI): 0.22 cm^2/m^2. Peak velocity ratio of LVOT to aortic valve: 0.23. Indexed valve area (Vmax): 0.27 cm^2/m^2. Mean velocity ratio of LVOT to aortic valve: 0.23. Indexed valve area (Vmean): 0.26 cm^2/m^2.    Mean gradient (S): 61 mm Hg. Peak gradient (S): 93 mm  Hg.  ------------------------------------------------------------------- Aorta:  Aortic root: The aortic root was normal in size. Ascending aorta: The ascending aorta was normal in size.  ------------------------------------------------------------------- Mitral valve:   Moderately calcified annulus.  Doppler:   There was no evidence for stenosis.   There was no significant regurgitation.   ------------------------------------------------------------------- Left atrium:  The atrium was mildly dilated.  ------------------------------------------------------------------- Right ventricle:  The cavity size was normal. Pacer wire or catheter noted in right ventricle. Systolic function was normal.   ------------------------------------------------------------------- Pulmonic valve:    Structurally normal valve.   Cusp separation was normal.  Doppler:  Transvalvular velocity was within the normal range. There was no regurgitation.  ------------------------------------------------------------------- Tricuspid valve:   Doppler:  There was trivial regurgitation.   ------------------------------------------------------------------- Right atrium:  The atrium was mildly dilated.  ------------------------------------------------------------------- Pericardium:  There was no pericardial effusion.  ------------------------------------------------------------------- Systemic veins: Inferior vena cava: The vessel was normal in size. The respirophasic diameter changes were in the normal range (>= 50%), consistent with normal central venous pressure.  ------------------------------------------------------------------- Measurements   Left ventricle                           Value          Reference  LV ID, ED, PLAX chordal          (L)     41.3  mm       43 - 52  LV ID, ES, PLAX chordal  31.3  mm       23 - 38  LV fx shortening, PLAX chordal   (L)     24    %         >=29  LV PW thickness, ED                      11.9  mm       ----------  IVS/LV PW ratio, ED                      0.95           <=1.3  Stroke volume, 2D                        60    ml       ----------  Stroke volume/bsa, 2D                    30    ml/m^2   ----------  LV e&', lateral                           6.09  cm/s     ----------  LV E/e&', lateral                         10.76          ----------  LV e&', medial                            5.44  cm/s     ----------  LV E/e&', medial                          12.04          ----------  LV e&', average                           5.77  cm/s     ----------  LV E/e&', average                         11.36          ----------    Ventricular septum                       Value          Reference  IVS thickness, ED                        11.3  mm       ----------    LVOT                                     Value          Reference  LVOT ID, S                               17    mm       ----------  LVOT area  2.27  cm^2     ----------  LVOT peak velocity, S                    112   cm/s     ----------  LVOT mean velocity, S                    83.7  cm/s     ----------  LVOT VTI, S                              26.4  cm       ----------  LVOT peak gradient, S                    5     mm Hg    ----------    Aortic valve                             Value          Reference  Aortic valve peak velocity, S            481   cm/s     ----------  Aortic valve mean velocity, S            369   cm/s     ----------  Aortic valve VTI, S                      136   cm       ----------  Aortic mean gradient, S                  61    mm Hg    ----------  Aortic peak gradient, S                  93    mm Hg    ----------  VTI ratio, LVOT/AV                       0.19           ----------  Aortic valve area, VTI                   0.44  cm^2     ----------  Aortic valve area/bsa, VTI               0.22  cm^2/m^2 ----------   Velocity ratio, peak, LVOT/AV            0.23           ----------  Aortic valve area/bsa, peak              0.27  cm^2/m^2 ----------  velocity  Velocity ratio, mean, LVOT/AV            0.23           ----------  Aortic valve area/bsa, mean              0.26  cm^2/m^2 ----------  velocity  Aortic regurg pressure half-time         368   ms       ----------    Aorta  Value          Reference  Aortic root ID, ED                       30    mm       ----------    Left atrium                              Value          Reference  LA ID, A-P, ES                           38    mm       ----------  LA ID/bsa, A-P                           1.92  cm/m^2   <=2.2  LA volume, S                             38.8  ml       ----------  LA volume/bsa, S                         19.6  ml/m^2   ----------  LA volume, ES, 1-p A4C                   22.6  ml       ----------  LA volume/bsa, ES, 1-p A4C               11.4  ml/m^2   ----------  LA volume, ES, 1-p A2C                   57.4  ml       ----------  LA volume/bsa, ES, 1-p A2C               28.9  ml/m^2   ----------    Mitral valve                             Value          Reference  Mitral E-wave peak velocity              65.5  cm/s     ----------  Mitral A-wave peak velocity              124   cm/s     ----------  Mitral deceleration time                 187   ms       150 - 230  Mitral E/A ratio, peak                   0.5            ----------    Pulmonary arteries                       Value          Reference  PA pressure, S, DP  25    mm Hg    <=30    Tricuspid valve                          Value          Reference  Tricuspid regurg peak velocity           234   cm/s     ----------  Tricuspid peak RV-RA gradient            22    mm Hg    ----------    Right atrium                             Value          Reference  RA ID, S-I, ES, A4C              (H)     53    mm       34 -  49  RA area, ES, A4C                 (H)     21    cm^2     8.3 - 19.5  RA volume, ES, A/L                       69.2  ml       ----------  RA volume/bsa, ES, A/L                   34.9  ml/m^2   ----------    Systemic veins                           Value          Reference  Estimated CVP                            3     mm Hg    ----------    Right ventricle                          Value          Reference  TAPSE                                    24.9  mm       ----------  RV s&', lateral, S                        10.6  cm/s     ----------  Legend: (L)  and  (H)  mark values outside specified reference range.  ------------------------------------------------------------------- Prepared and Electronically Authenticated by  Loralie Champagne, M.D. 2018-08-21T15:01:59   [facility]  CARDIAC CATHETERIZATION  Order# [id number]  Reading physician: Wellington Hampshire, MD Ordering physician: Wellington Hampshire, MD Study date: 02/16/17  Physicians   Panel Physicians Referring Physician Case Authorizing Physician  Wellington Hampshire, MD (Primary)    Procedures   RIGHT/LEFT HEART CATH AND CORONARY ANGIOGRAPHY  Conclusion     Prox RCA to Mid RCA lesion, 90 %stenosed.  Mid Cx lesion, 70 %stenosed.   1.heavily calcified  coronary arteries with significant two-vessel coronary artery disease affecting the proximal to mid right coronary artery and distal left circumflex supplying a small OM 3 branch.  2. Right heart catheterization showed normal filling pressures, no significant pulmonary hypertension and normal cardiac output.  3. Heavily calcified aortic valve with severely restricted opening. I did not attempt to cross the valve. This is known to be severely stenotic by echo.  Recommendations: Aortic valve replacement +1 vessel CABG to the right coronary artery versus TAVR and RCA PCI ( this requires atherectomy and high risk for contrast-induced nephropathy.    Indications    Nonrheumatic aortic valve stenosis [I35.0 (ICD-10-CM)]  Procedural Details/Technique   Technical Details Procedural Details: The pre-existing IV in the right antecubital vein was exchanged under sterile fashion to a slender sheath. Right heart catheterization was performed using a 5 French Swan-Ganz catheter. Cardiac output was calculated by the Fick method. The right wrist was prepped, draped, and anesthetized with 1% lidocaine. Using the modified Seldinger technique, a 5 French sheath was introduced into the right radial artery. 3 mg of verapamil was administered through the sheath, weight-based unfractionated heparin was administered intravenously. A Jackie catheter was used for selective coronary angiography. I did not attempt to cross the aortic valve There were no immediate procedural complications. A TR band was used for radial hemostasis at the completion of the procedure. The patient was transferred to the post catheterization recovery area for further monitoring.   Estimated blood loss <50 mL.  During this procedure the patient was administered the following to achieve and maintain moderate conscious sedation: Versed 1 mg, Fentanyl 25 mcg, while the patient's heart rate, blood pressure, and oxygen saturation were continuously monitored. The period of conscious sedation was 28 minutes, of which I was present face-to-face 100% of this time.    Coronary Findings   Dominance: Right  Left Anterior Descending  There is mild the vessel.  First Diagonal Branch  Vessel is small in size. There is mild disease in the vessel.  Second Diagonal Branch  There is mild disease in the vessel.  Third Diagonal Branch  Vessel is small in size. There is mild disease in the vessel.  Left Circumflex  Mid Cx lesion, 70% stenosed.  First Obtuse Marginal Branch  The vessel exhibits minimal luminal irregularities.  Second Obtuse Marginal Branch  The vessel exhibits minimal luminal irregularities.   Third Obtuse Marginal Branch  Vessel is small in size. Vessel is angiographically normal.  Right Coronary Artery  Prox RCA to Mid RCA lesion, 90% stenosed. The lesion is severely calcified.  Right Posterior Descending Artery  The vessel exhibits minimal luminal irregularities.  Right Posterior Atrioventricular Branch  There is mild disease in the vessel.  First Right Posterolateral  There is mild disease in the vessel.  Second Right Posterolateral  There is mild disease in the vessel.  Coronary Diagrams   Diagnostic Diagram       Implants     No implant documentation for this case.  PACS Images   Show images for CARDIAC CATHETERIZATION   Link to Procedure Log   Procedure Log    Hemo Data    Most Recent Value  Fick Cardiac Output 4.78 L/min  Fick Cardiac Output Index 2.54 (L/min)/BSA  RA A Wave 5 mmHg  RA V Wave 3 mmHg  RA Mean 2 mmHg  RV Systolic Pressure 33 mmHg  RV Diastolic Pressure 2 mmHg  RV EDP 6 mmHg  PA Systolic Pressure 29 mmHg  PA Diastolic Pressure 8 mmHg  PA Mean 18 mmHg  PW A Wave 7 mmHg  PW V Wave 5 mmHg  PW Mean 4 mmHg  AO Systolic Pressure 740 mmHg  AO Diastolic Pressure 54 mmHg  AO Mean 82 mmHg  QP/QS 1  TPVR Index 7.09 HRUI  TSVR Index 32.29 HRUI  PVR SVR Ratio 0.18  TPVR/TSVR Ratio 0.22    [facility]  CARDIAC CATHETERIZATION  Order# [id number]  Reading physician: [name], MD Ordering physician: [name], MD Study date: 02/23/17  Physicians   Panel Physicians Referring Physician Case Authorizing Physician  [name], MD (Primary)    Procedures   CORONARY ATHERECTOMY  Conclusion   Successful PCI of severe stenosis in the mid right coronary artery using orbital atherectomy with drug-eluting stent implantation (3.5 x 23 mm Abbott Sierra DES)    ADDENDUM REPORT: 03/09/2017 14:42  CLINICAL DATA:  49 -year-old female with severe aortic stenosis being evaluated for a TAVR procedure.  EXAM: Cardiac TAVR  CT  TECHNIQUE: The patient was scanned on a [employer]. A 120 kV retrospective scan was triggered in the descending thoracic aorta at 111 HU's. Gantry rotation speed was 250 msecs and collimation was .6 mm. No beta blockade or nitro were given. The 3D data set was reconstructed in 5% intervals of the R-R cycle. Systolic and diastolic phases were analyzed on a dedicated work station using MPR, MIP and VRT modes. The patient received 80 cc of contrast.  FINDINGS: Aortic Valve: Trileaflet, severely thickened and calcified aortic valve with severely restricted leaflet opening. No calcifications are extending into the LVOT.  Aorta:  Mild diffuse calcifications, normal size, no dissection.  Sinotubular Junction:  25 x 24 mm  Ascending Thoracic Aorta:  34 x 32 mm  Aortic Arch:  24 x 24 mm  Descending Thoracic Aorta:  22 x 22 mm  Sinus of Valsalva Measurements:  Non-coronary:  26 mm  Right -coronary:  26 mm  Left -coronary:  25 mm  Coronary Artery Height above Annulus:  Left Main:  10 mm  Right Coronary:  11 mm  Virtual Basal Annulus Measurements:  Maximum/Minimum Diameter:  22 x 19 mm  Perimeter:  66 mm  Area:  336 mm2  Coronary Arteries:  Normal origin.  Optimum Fluoroscopic Angle for Delivery:  LAO 7 CAU 7  IMPRESSION: 1. Trileaflet, severely thickened and calcified aortic valve with severely restricted leaflet opening. No calcifications are extending into the LVOT. Annular size suitable for a 23 mm Edwards-SAPIEN 3 valve or a 26 mm Evolut Pro valve.  2. Relatively short annulus to left main coronary distance.  3. Optimum Fluoroscopic Angle for Delivery:  LAO 7 CAU 7  4.  There is a large left atrial appendage with no thrombus.  5. Dilated pulmonary artery measuring 39 x 38 consistent with pulmonary hypertension.  Ena Dawley   Electronically Signed   By: Ena Dawley   On: 03/09/2017 14:42    Addended by Dorothy Spark, MD on 03/09/2017 2:44 PM    Study Result   EXAM: OVER-READ INTERPRETATION  CT CHEST  The following report is an over-read performed by radiologist Dr. Rebekah Chesterfield [facility] on 03/09/2017. This over-read does not include interpretation of cardiac or coronary anatomy or pathology. The coronary calcium score/coronary CTA interpretation by the cardiologist is attached.  COMPARISON:  None.  FINDINGS: Extracardiac findings will be due described under separate dictation for contemporaneously obtained CTA of the chest, abdomen and pelvis.  IMPRESSION: Please see separate dictation for contemporaneously obtained CTA of the chest, abdomen and pelvis dated 03/07/2017 for full description of relevant extracardiac findings.  Electronically Signed: By: Vinnie Langton M.D. On: 03/09/2017 08:41       CLINICAL DATA:  83 year old female with history of severe aortic stenosis. Preprocedural study prior to potential transcatheter aortic valve replacement (TAVR).  EXAM: CT ANGIOGRAPHY CHEST, ABDOMEN AND PELVIS  TECHNIQUE: Multidetector CT imaging through the chest, abdomen and pelvis was performed using the standard protocol during bolus administration of intravenous contrast. Multiplanar reconstructed images and MIPs were obtained and reviewed to evaluate the vascular anatomy.  CONTRAST:  95 mL of Isovue 370.  COMPARISON:  None.  FINDINGS: CTA CHEST FINDINGS  Cardiovascular: Heart size is mildly enlarged. There is no significant pericardial fluid, thickening or pericardial calcification. There is aortic atherosclerosis, as well as atherosclerosis of the great vessels of the mediastinum and the coronary arteries, including calcified atherosclerotic plaque in the left main, left anterior descending, left circumflex and right coronary arteries. Severe thickening calcification of the aortic valve. Left-sided  pacemaker with lead tips terminating in the right atrium and near the right ventricular apex. Dilatation of the pulmonic trunk (4 cm in diameter).  Mediastinum/Lymph Nodes: No pathologically enlarged mediastinal or hilar lymph nodes. Esophagus is unremarkable in appearance. No axillary lymphadenopathy.  Lungs/Pleura: No acute consolidative airspace disease. No pleural effusions. No suspicious appearing pulmonary nodules or masses.  Musculoskeletal/Soft Tissues: There are no aggressive appearing lytic or blastic lesions noted in the visualized portions of the skeleton.  CTA ABDOMEN AND PELVIS FINDINGS  Hepatobiliary: No cystic or solid hepatic lesions. No intra or extrahepatic biliary ductal dilatation. Gallbladder is normal in appearance.  Pancreas: No pancreatic mass. No pancreatic ductal dilatation. No pancreatic or peripancreatic fluid or inflammatory changes.  Spleen: Unremarkable.  Adrenals/Urinary Tract: 1.2 cm bilateral adrenal nodules, unchanged in size compared to prior studies (previously low-attenuation on noncontrast CT examinations), compatible with small adenomas. Bilateral kidneys are normal in appearance. No hydroureteronephrosis. Urinary bladder is normal in appearance.  Stomach/Bowel: The appearance of the stomach is normal. There is no pathologic dilatation of small bowel or colon. The appendix is not confidently identified and may be surgically absent. Regardless, there are no inflammatory changes noted adjacent to the cecum to suggest the presence of an acute appendicitis at this time.  Vascular/Lymphatic: Aortic atherosclerosis with vascular findings and measurements pertinent to potential TAVR procedure, as detailed below. No aneurysm or dissection noted in the abdominal or pelvic vasculature. Celiac axis, superior mesenteric artery and inferior mesenteric artery all appear widely patent without definite hemodynamically significant stenosis.  No lymphadenopathy noted in the abdomen or pelvis.  Reproductive: Status post hysterectomy.  Ovaries are atrophic.  Other: No significant volume of ascites.  No pneumoperitoneum.  Musculoskeletal: Status post ORIF in the right proximal femur incompletely visualized. There are no aggressive appearing lytic or blastic lesions noted in the visualized portions of the skeleton.  VASCULAR MEASUREMENTS PERTINENT TO TAVR:  AORTA:  Minimal Aortic Diameter -  10 x 13 mm  Severity of Aortic Calcification -  moderate  RIGHT PELVIS:  Right Common Iliac Artery -  Minimal Diameter - 6.7 x 7.2 mm  Tortuosity - mild  Calcification - moderate  Right External Iliac Artery -  Minimal Diameter - 6.3 x 6.3 mm  Tortuosity - mild  Calcification - mild  Right Common Femoral Artery -  Minimal Diameter - 6.6 x 3.3 mm  Tortuosity - mild  Calcification -  moderate  LEFT PELVIS:  Left Common Iliac Artery -  Minimal Diameter - 5.8 x 8.4 mm  Tortuosity - mild  Calcification - moderate  Left External Iliac Artery -  Minimal Diameter - 6.4 x 6.3 mm  Tortuosity - mild  Calcification - none  Left Common Femoral Artery -  Minimal Diameter - 6.4 x 3.3 mm  Tortuosity - mild  Calcification - moderate  Review of the MIP images confirms the above findings.  IMPRESSION: 1. Vascular findings and measurements pertinent to potential TAVR procedure, as detailed above. This patient does not appear to have suitable pelvic arterial access on either side due to small caliber of the vessels. 2. Severe thickening calcification of the aortic valve, compatible with the reported clinical history of severe aortic stenosis. 3. Aortic atherosclerosis, in addition to left main and 3 vessel coronary artery disease. 4. Dilatation of the pulmonic trunk (4 cm in diameter), suggesting pulmonary arterial hypertension. 5. Additional incidental findings, as  above. Aortic Atherosclerosis (ICD10-I70.0).   Electronically Signed   By: Vinnie Langton M.D.   On: 03/09/2017 09:20  STS Risk Calculator: Procedure: AV Replacement   Risk of Mortality: 7.976%  Morbidity or Mortality: 41.177%  Long Length of Stay: 22.993%  Short Length of Stay: 9.177%  Permanent Stroke: 4.221%  Prolonged Ventilation: 29.694%  DSW Infection: 0.19%  Renal Failure: 22.086%  Reoperation: 12.319%  Impression:  This 83 year old woman has stage D, severe, symptomatic aortic stenosis with NYHA class II symptoms of exertional shortness of breath and fatigue as well as chest tightness consistent with angina and diastolic heart failure. She had a high grade mid RCA stenosis treated with atherectomy and DES. She is not having any significant symptoms since discharge but is not doing any activity. I have personally reviewed her echo, cath and CTA studies. Her echo shows severe AS with severe thickening, calcification and reduced mobility of her trileaflet valve. The mean gradient was 61 mm Hg. Her cath showed a high grade RCA stenosis that was treated with PCI on 8/29. There is a 70% distal LCX stenosis that is insignificant and only supplies a small OM3. I agree that AVR is indicated in this patient with severe to critical AS. Her operative risk for open surgical AVR would be high and therefore TAVR is felt to be the optimal treatment for her. Her gated cardiac CT shows anatomy suitable for a 23 mm Sapien 3 valve. Her abdominal and pelvic CT shows some calcified plaque and narrowing in the common femoral arteries bilaterally but think the vasculature is adequate to allow transfemoral insertion above this and there is minimal tortuosity.   The patient was counseled at length regarding treatment alternatives for management of severe symptomatic aortic stenosis. The risks and benefits of surgical intervention has been discussed in detail. Long-term prognosis with medical therapy was  discussed. Alternative approaches such as conventional surgical aortic valve replacement, transcatheter aortic valve replacement, and palliative medical therapy were compared and contrasted at length. This discussion was placed in the context of the patient's own specific clinical presentation and past medical history. All of her questions have been addressed. The patient has been advised of a variety of complications that might develop including but not limited to risks of death, stroke, paravalvular leak, aortic dissection or other major vascular complications, aortic annulus rupture, device embolization, cardiac rupture or perforation, mitral regurgitation, acute myocardial infarction, arrhythmia, heart block or bradycardia requiring permanent pacemaker placement, congestive heart failure, respiratory failure, renal failure, pneumonia,  infection, other late complications related to structural valve deterioration or migration, or other complications that might ultimately cause a temporary or permanent loss of functional independence or other long term morbidity. The patient provides full informed consent for the procedure as described and all questions were answered.    Plan:  She will be scheduled for transfemoral TAVR on 04/12/2017 with Dr. Burt Knack and Dr. Roxy Manns.   I spent 60 minutes performing this consultation and > 50% of this time was spent face to face counseling and coordinating the care of this patient's severe aortic stenosis.   Gaye Pollack, MD 03/16/2017

## 2017-04-12 NOTE — Transfer of Care (Signed)
Immediate Anesthesia Transfer of Care Note  Patient: Molly Cobb  Procedure(s) Performed: TRANSCATHETER AORTIC VALVE REPLACEMENT, TRANSFEMORAL (N/A Chest) TRANSESOPHAGEAL ECHOCARDIOGRAM (TEE) (N/A )  Patient Location: SICU  Anesthesia Type:MAC  Level of Consciousness: awake, alert , oriented and patient cooperative  Airway & Oxygen Therapy: Patient Spontanous Breathing and Patient connected to nasal cannula oxygen  Post-op Assessment: Report given to RN, Post -op Vital signs reviewed and stable and Patient moving all extremities  Post vital signs: Reviewed and stable  Last Vitals:  Vitals:   04/12/17 0616 04/12/17 0953  BP: (!) 139/57   Pulse: 81 74  Resp: 18   Temp: 36.6 C   SpO2: 99%     Last Pain:  Vitals:   04/12/17 0616  TempSrc: Oral      Patients Stated Pain Goal: 3 (05/69/79 4801)  Complications: No apparent anesthesia complications

## 2017-04-12 NOTE — Op Note (Signed)
[facility]   MULTIDISCIPLINARY HEART VALVE TEAM   TAVR OPERATIVE NOTE   Date of Procedure:  04/12/2017  Preoperative Diagnosis: Severe Aortic Stenosis   Postoperative Diagnosis: Same   Procedure:    Transcatheter Aortic Valve Replacement - Percutaneous Transfemoral Approach  Edwards Sapien 3 THV (size 23 mm, model # 9600TFX, serial # [id number])   Co-Surgeons:  Valentina Gu. Roxy Manns, MD and [name], MD  Anesthesiologist:  Dr Glennon Mac  Echocardiographer:  Dr Meda Coffee  Pre-operative Echo Findings:  Severe aortic stenosis  Normal left ventricular systolic function  Post-operative Echo Findings:  No paravalvular leak  Normal left ventricular systolic function  Mean aortic transvalvular gradient 3 mmHg  BRIEF CLINICAL NOTE AND INDICATIONS FOR SURGERY The patient is an 83 year old woman initially seen during a hospitalization in August 2018. She presented with shortness of breath and exertional angina in the setting of microcytic anemia. She was ultimately found to have severe aortic stenosis with a mean transvalvular gradient of 61 mmHg by echo assessment. The patient is noted to have stage IV chronic kidney disease with a baseline creatinine around 2.6 mg/dL. She was evaluated by the multidisciplinary heart valve team and felt to be a candidate for TAVR. She underwent upper endoscopy demonstrating an AVM and her hemoglobin remained stable after packed red blood cell transfusion. Cardiac catheterization demonstrated severe single-vessel coronary artery disease with severe stenosis of the mid right coronary artery. She underwent atherectomy and PCI using a drug-eluting stent platform without complication. She has done well in the interim and now presents for TAVR.   During the course of the patient's preoperative work up they have been evaluated comprehensively by a multidisciplinary team of specialists coordinated through the [facility] in  the [facility].  They have been demonstrated to suffer from symptomatic severe aortic stenosis as noted above. The patient has been counseled extensively as to the relative risks and benefits of all options for the treatment of severe aortic stenosis including long term medical therapy, conventional surgery for aortic valve replacement, and transcatheter aortic valve replacement.  The patient has been independently evaluated by two cardiac surgeons including Dr. Roxy Manns and Dr. Cyndia Bent, and they are felt to be at high risk for conventional surgical aortic valve replacement based upon a predicted risk of mortality using the Society of Thoracic Surgeons risk calculator of 8%. Both surgeons indicated the patient would be a poor candidate for conventional surgery because of comorbidities including Stage 4 CKD, recent GI bleeding. .   Based upon review of all of the patient's preoperative diagnostic tests they are felt to be candidate for transcatheter aortic valve replacement using the transfemoral approach as an alternative to high risk conventional surgery.    Following the decision to proceed with transcatheter aortic valve replacement, a discussion has been held regarding what types of management strategies would be attempted intraoperatively in the event of life-threatening complications, including whether or not the patient would be considered a candidate for the use of cardiopulmonary bypass and/or conversion to open sternotomy for attempted surgical intervention.  The patient has been advised of a variety of complications that might develop peculiar to this approach including but not limited to risks of death, stroke, paravalvular leak, aortic dissection or other major vascular complications, aortic annulus rupture, device embolization, cardiac rupture or perforation, acute myocardial infarction, arrhythmia, heart block or bradycardia requiring permanent pacemaker placement, congestive  heart failure, respiratory failure, renal failure, pneumonia, infection, other late complications  related to structural valve deterioration or migration, or other complications that might ultimately cause a temporary or permanent loss of functional independence or other long term morbidity.  The patient provides full informed consent for the procedure as described and all questions were answered preoperatively.  The patient is noted to have a small aortic root, small left coronary sinus, and low left mainstem. Because of this anatomy, we elected to gain additional arterial access in order to place a stent in the left coronary system so that it could potentially be pulled back to stent open the left main if there was compromise related to TAVR valve deployment.  DETAILS OF THE OPERATIVE PROCEDURE  PREPARATION:   The patient is brought to the operating room on the above mentioned date and central monitoring was established by the anesthesia team including placement of a central venous catheter and radial arterial line. The patient is placed in the supine position on the operating table.  Intravenous antibiotics are administered. The patient is monitored closely throughout the procedure under conscious sedation.  Baseline transesophageal echocardiogram is performed. The patient's chest, abdomen, both groins, and both lower extremities are prepared and draped in a sterile manner. A time out procedure is performed.  PERIPHERAL ACCESS:   There is an indwelling arterial line in the left radial artery. Using sterile technique, this is changed out for a 5/6 [place] slender sheath. A pigtail catheter is advanced into the aortic root from the left radial artery without difficulty. This catheter is used for aortic root angiography during the procedure.  Using ultrasound guidance, femoral arterial and venous access is obtained with placement of 6 Fr sheaths on the left side.  A temporary transvenous pacemaker  catheter was passed through the femoral venous sheath under fluoroscopic guidance into the right ventricle.  The pacemaker was tested to ensure stable lead placement and pacemaker capture. Aortic root angiography was performed in order to determine the optimal angiographic angle for valve deployment.  A 6 French EBU 3.5 cm guide catheter is inserted through the left femoral arterial sheath. The ACT is therapeutic after administration of IV heparin. A cougar guidewire is advanced into the apical portion of the LAD without difficulty. A 4.0 x 12 mm Promus DES is advanced into the LAD but not deployed. The stent is left in place in case there is compromise of the left main so that it could be pulled back to stent open the left mainstem. The guide catheter is pulled back more proximally in the ascending aorta and left in position with the wire and stent in place and the LAD.  TRANSFEMORAL ACCESS:  A micropuncture technique is used to access the right femoral artery under fluoroscopic and ultrasound guidance.  2 Perclose devices are deployed at 10' and 2' positions to 'PreClose' the femoral artery. An 8 French sheath is placed and then an Amplatz Superstiff wire is advanced through the sheath. This is changed out for a 14 French transfemoral E-Sheath after progressively dilating over the Superstiff wire.  An AL-1 catheter was used to direct a straight-tip exchange length wire across the native aortic valve into the left ventricle. This was exchanged out for a pigtail catheter and position was confirmed in the LV apex. Simultaneous LV and Ao pressures were recorded.  The pigtail catheter was exchanged for an Amplatz Extra-stiff wire in the LV apex.  Initially the patient became hypotensive with the stiff wire in the left ventricle. Ultrasound imaging demonstrated severe mitral regurgitation and we felt the  wire was likely entrapped in the mitral apparatus. The wire was repositioned using a pigtail catheter in the  patient's blood pressure quickly normalized. Echocardiography was utilized to confirm appropriate wire position and no sign of entanglement in the mitral subvalvular apparatus.  TRANSCATHETER HEART VALVE DEPLOYMENT:  An Edwards Sapien 3 transcatheter heart valve (size 23 mm, model #9600TFX, serial #[id number]) was prepared and crimped per manufacturer's guidelines, and the proper orientation of the valve is confirmed on the [employer] delivery system. The valve was advanced through the introducer sheath using normal technique until in an appropriate position in the abdominal aorta beyond the sheath tip. The balloon was then retracted and using the fine-tuning wheel was centered on the valve. The valve was then advanced across the aortic arch using appropriate flexion of the catheter. The valve was carefully positioned across the aortic valve annulus. The Commander catheter was retracted using normal technique. Once final position of the valve has been confirmed by angiographic assessment, the valve is deployed while temporarily holding ventilation and during rapid ventricular pacing to maintain systolic blood pressure < 50 mmHg and pulse pressure < 10 mmHg. The balloon inflation is held for >3 seconds after reaching full deployment volume. Once the balloon has fully deflated the balloon is retracted into the ascending aorta and valve function is assessed using echocardiography. There is felt to be no paravalvular leak and no central aortic insufficiency.  The patient's hemodynamic recovery following valve deployment is good.  The deployment balloon and guidewire are both removed. Echo demostrated acceptable post-procedural gradients, stable mitral valve function, and no aortic insufficiency.  After the valve is deployed and echo imaging confirms normal LV function and no significant valvular regurgitation, the stent is removed from the left coronary artery. There is a moderate amount of resistance pulling  the stent out of left main around the stent frame of the S3 valve. The stent is visualized coming back into the guide catheter. However, when the balloon catheter is removed the stent is no longer on the balloon. The guide catheter is removed from the body and the stent is found within the Tuohy-Borst adapter.   PROCEDURE COMPLETION:  The sheath was removed and femoral artery closure is performed using the 2 previously deployed Perclose devices.  Protamine is administered once femoral arterial repair was complete. The site is clear with no evidence of bleeding or hematoma after the sutures are tightened. The temporary pacemaker, pigtail catheters and femoral sheaths were removed with manual pressure used for hemostasis.   The patient tolerated the procedure well and is transported to the surgical intensive care in stable condition. There were no immediate intraoperative complications. All sponge instrument and needle counts are verified correct at completion of the operation.   The patient received a total of 35 mL of intravenous contrast during the procedure.  [name], MD 04/12/2017 11:02 AM

## 2017-04-12 NOTE — Progress Notes (Signed)
  Echocardiogram 2D Echocardiogram Limited for TAVR has been performed.  Darlina Sicilian M 04/12/2017, 10:18 AM

## 2017-04-12 NOTE — Care Management Note (Signed)
Case Management Note  Patient Details  Name: Marikay Roads MRN: [id number] Date of Birth: [birth year]  Subjective/Objective:    Severe aortic stenosis, TAVR                Action/Plan: Discharge Planning: NCM spoke to pt and Kinnie Feil # [phone] at bedside. Dtr states pt was active with [facility] for HHPT prior to admission. States pt will be going to her home post dc. Contacted [facility] to make aware. Will need resumption of HHPT orders at dc. Pt has RW and bedside commode at home.   Michelle's address:  [address], [place], [address]    PCP Guadlupe Spanish MD   Expected Discharge Date:  04/14/17               Expected Discharge Plan:  [place]  In-House Referral:  NA  Discharge planning Services  CM Consult  Post Acute Care Choice:  Home Health, Resumption of Svcs/PTA Provider Choice offered to:  Adult Children  DME Arranged:  N/A DME Agency:  NA  HH Arranged:  PT HH Agency:  [place]  Status of Service:  In process, will continue to follow  If discussed at Long Length of Stay Meetings, dates discussed:    Additional Comments:  Erenest Rasher, RN 04/12/2017, 4:57 PM

## 2017-04-12 NOTE — Anesthesia Procedure Notes (Signed)
Arterial Line Insertion Start/End10/16/2018 6:55 AM, 04/12/2017 7:05 AM Performed by: Kerby Less, CRNA  Patient location: Pre-op. Preanesthetic checklist: patient identified, IV checked, site marked, risks and benefits discussed, surgical consent, monitors and equipment checked, pre-op evaluation and timeout performed Lidocaine 1% used for infiltration Left, radial was placed Catheter size: 20 G Hand hygiene performed , maximum sterile barriers used  and Seldinger technique used Allen's test indicative of satisfactory collateral circulation Attempts: 2 Procedure performed without using ultrasound guided technique. Following insertion, Biopatch and dressing applied. Post procedure assessment: normal  Post procedure complications: local hematoma. Patient tolerated the procedure well with no immediate complications. Additional procedure comments: Cannulated overriding vein on first attempt small hematoma noted pressure held .

## 2017-04-12 NOTE — Progress Notes (Signed)
Left radial arterial line removed and TR band placed with 10cc of air in the band.  Site looks good.  6 french arterial sheath and 6 french venous sheath removed from left femoral site and pressure held x 20 minutes.  BP a little low during sheath removal with systolic about 90.  Site dressed with 4x4 and tape.  Site looked good post removal, level 0 with no hematoma.  Distal DP pulse in lt foot 1+.  Bedrest instructions given.  RN will continue to monitor site.

## 2017-04-12 NOTE — Interval H&P Note (Signed)
History and Physical Interval Note:  04/12/2017 7:25 AM  Molly Cobb  has presented today for surgery, with the diagnosis of severe aortic stenosis  The various methods of treatment have been discussed with the patient and family. After consideration of risks, benefits and other options for treatment, the patient has consented to  Procedure(s): TRANSCATHETER AORTIC VALVE REPLACEMENT, TRANSFEMORAL (N/A) TRANSESOPHAGEAL ECHOCARDIOGRAM (TEE) (N/A) as a surgical intervention .  The patient's history has been reviewed, patient examined, no change in status, stable for surgery.  I have reviewed the patient's chart and labs.  Questions were answered to the patient's satisfaction.     Rexene Alberts

## 2017-04-12 NOTE — Progress Notes (Signed)
Spoke with Molly Cobb this morning.  Patient is a pacemaker only and Molly Cobb stated that she should not be needed.  She did say that she would be on site this morning if we were to need her.  No device orders received, emergency orders placed on chart

## 2017-04-12 NOTE — Anesthesia Procedure Notes (Signed)
Procedure Name: MAC Performed by: Unnamed Zeien L Oxygen Delivery Method: Nasal cannula

## 2017-04-13 ENCOUNTER — Other Ambulatory Visit: Payer: Self-pay

## 2017-04-13 ENCOUNTER — Encounter (HOSPITAL_COMMUNITY): Payer: Self-pay | Admitting: Cardiovascular Disease

## 2017-04-13 ENCOUNTER — Inpatient Hospital Stay (HOSPITAL_COMMUNITY): Payer: Medicare Other

## 2017-04-13 DIAGNOSIS — I350 Nonrheumatic aortic (valve) stenosis: Principal | ICD-10-CM

## 2017-04-13 DIAGNOSIS — I5030 Unspecified diastolic (congestive) heart failure: Secondary | ICD-10-CM

## 2017-04-13 DIAGNOSIS — Z952 Presence of prosthetic heart valve: Secondary | ICD-10-CM

## 2017-04-13 LAB — CBC
HEMATOCRIT: 31.1 % — AB (ref 36.0–46.0)
Hemoglobin: 9.8 g/dL — ABNORMAL LOW (ref 12.0–15.0)
MCH: 25.4 pg — AB (ref 26.0–34.0)
MCHC: 31.5 g/dL (ref 30.0–36.0)
MCV: 80.6 fL (ref 78.0–100.0)
Platelets: 165 10*3/uL (ref 150–400)
RBC: 3.86 MIL/uL — ABNORMAL LOW (ref 3.87–5.11)
RDW: 13.6 % (ref 11.5–15.5)
WBC: 8 10*3/uL (ref 4.0–10.5)

## 2017-04-13 LAB — GLUCOSE, CAPILLARY
GLUCOSE-CAPILLARY: 169 mg/dL — AB (ref 65–99)
Glucose-Capillary: 155 mg/dL — ABNORMAL HIGH (ref 65–99)
Glucose-Capillary: 316 mg/dL — ABNORMAL HIGH (ref 65–99)
Glucose-Capillary: 165 mg/dL — ABNORMAL HIGH (ref 65–99)

## 2017-04-13 LAB — ECHOCARDIOGRAM LIMITED
HEIGHTINCHES: 63 in
WEIGHTICAEL: 2960 [oz_av]

## 2017-04-13 LAB — BASIC METABOLIC PANEL
ANION GAP: 7 (ref 5–15)
BUN: 49 mg/dL — AB (ref 6–20)
CALCIUM: 8.3 mg/dL — AB (ref 8.9–10.3)
CO2: 21 mmol/L — AB (ref 22–32)
Chloride: 109 mmol/L (ref 101–111)
Creatinine, Ser: 2.6 mg/dL — ABNORMAL HIGH (ref 0.44–1.00)
GFR calc Af Amer: 18 mL/min — ABNORMAL LOW (ref >60)
GFR calc non Af Amer: 16 mL/min — ABNORMAL LOW (ref >60)
GLUCOSE: 139 mg/dL — AB (ref 65–99)
Potassium: 4.4 mmol/L (ref 3.5–5.1)
Sodium: 137 mmol/L (ref 135–145)

## 2017-04-13 LAB — MAGNESIUM: Magnesium: 1.7 mg/dL (ref 1.7–2.4)

## 2017-04-13 MED ORDER — DEXTROSE 5 % IV SOLN
1.5000 g | INTRAVENOUS | Status: AC
  Administered 2017-04-14: 1.5 g via INTRAVENOUS
  Filled 2017-04-13: qty 1.5

## 2017-04-13 MED FILL — Potassium Chloride Inj 2 mEq/ML: INTRAVENOUS | Qty: 40 | Status: AC

## 2017-04-13 MED FILL — Magnesium Sulfate Inj 50%: INTRAMUSCULAR | Qty: 10 | Status: AC

## 2017-04-13 MED FILL — Heparin Sodium (Porcine) Inj 1000 Unit/ML: INTRAMUSCULAR | Qty: 30 | Status: AC

## 2017-04-13 NOTE — Progress Notes (Signed)
CARDIAC REHAB PHASE I   PRE:  Rate/Rhythm: 113 ST  BP:  Sitting: 113/55        SaO2: 98 RA  MODE:  Ambulation: 370 ft   POST:  Rate/Rhythm: 95 SR  BP:  Sitting: 134/58         SaO2: 100 RA  Pt states she has been up in the chair most of the day, eager to walk. Pt ambulated 370 ft on RA, hand held assist, steady gait, tolerated well with no complaints other than mild tenderness in her R groin. Encouraged IS. Pt to bed per pt request after walk, call bell within reach. Will follow.   [place], RN, BSN 04/13/2017 3:13 PM

## 2017-04-13 NOTE — Progress Notes (Signed)
  Echocardiogram 2D Echocardiogram limited post TAVR has been performed.  Darlina Sicilian M 04/13/2017, 12:09 PM

## 2017-04-13 NOTE — Progress Notes (Signed)
Progress Note  Patient Name: Molly Cobb Date of Encounter: 04/13/2017  Primary Cardiologist: Burt Knack  Subjective   Feels well. Complaining of some pain in her lower legs. Otherwise no complaints. Specifically denies chest pain or shortness of breath.  Inpatient Medications    Scheduled Meds: . amLODipine  10 mg Oral Daily  . aspirin  81 mg Oral Daily  . clopidogrel  75 mg Oral Daily  . insulin aspart  0-5 Units Subcutaneous QHS  . insulin aspart  0-9 Units Subcutaneous TID WC  . levothyroxine  100 mcg Oral QAC breakfast  . metoprolol tartrate  12.5 mg Oral BID  . pantoprazole  40 mg Oral Daily  . rosuvastatin  40 mg Oral QHS  . sodium chloride flush  3 mL Intravenous Q12H   Continuous Infusions: . sodium chloride Stopped (04/13/17 0530)  . albumin human    . cefUROXime (ZINACEF)  IV Stopped (04/13/17 0530)  . lactated ringers    . nitroGLYCERIN    . phenylephrine (NEO-SYNEPHRINE) Adult infusion Stopped (04/12/17 1600)   PRN Meds: sodium chloride, albumin human, lactated ringers, metoprolol tartrate, morphine injection, ondansetron (ZOFRAN) IV, oxyCODONE, sodium chloride flush, traMADol   Vital Signs    Vitals:   04/13/17 0500 04/13/17 0600 04/13/17 0700 04/13/17 0800  BP: 120/65 (!) 123/41 103/67 (!) 110/45  Pulse: 73 80 70 70  Resp: 17 14 15 15   Temp:    98.3 F (36.8 C)  TempSrc:      SpO2: 100% 95% 100% 99%  Weight:      Height:        Intake/Output Summary (Last 24 hours) at 04/13/17 0851 Last data filed at 04/13/17 0600  Gross per 24 hour  Intake           2431.4 ml  Output             2750 ml  Net           -318.6 ml   Filed Weights   04/12/17 0606 04/12/17 0616  Weight: 185 lb (83.9 kg) 185 lb (83.9 kg)    Telemetry    Atrial paced rhythm without significant arrhythmia - Personally Reviewed  ECG    Atrial paced rhythm, LVH with repolarization abnormality - Personally Reviewed  Physical Exam  Pleasant elderly woman in no distress,  sitting up in chair GEN: No acute distress.   Neck: No JVD Cardiac: RRR, 2/6 early peaking systolic murmur at the right upper sternal border, no diastolic murmur Respiratory: Clear to auscultation bilaterally. GI: Soft, nontender, non-distended  MS: No edema; No deformity.bilateral groin sites are clear without evidence of hematoma or ecchymoses Neuro:  Nonfocal  Psych: Normal affect   Labs    Chemistry Recent Labs Lab 04/08/17 1537  04/12/17 0930 04/12/17 1032 04/12/17 1058 04/13/17 0143  NA 136  < > 141 140 141 137  K 4.3  < > 4.3 4.7 4.5 4.4  CL 103  < > 108 106  --  109  CO2 20*  --   --   --   --  21*  GLUCOSE 111*  < > 158* 177* 180* 139*  BUN 47*  < > 46* 48*  --  49*  CREATININE 2.76*  < > 2.60* 2.70*  --  2.60*  CALCIUM 9.4  --   --   --   --  8.3*  PROT 7.9  --   --   --   --   --  ALBUMIN 4.1  --   --   --   --   --   AST 105*  --   --   --   --   --   ALT 83*  --   --   --   --   --   ALKPHOS 121  --   --   --   --   --   BILITOT 0.6  --   --   --   --   --   GFRNONAA 15*  --   --   --   --  16*  GFRAA 17*  --   --   --   --  18*  ANIONGAP 13  --   --   --   --  7  < > = values in this interval not displayed.   Hematology Recent Labs Lab 04/08/17 1537  04/12/17 1032 04/12/17 1058 04/13/17 0143  WBC 8.5  --   --  7.4 8.0  RBC 4.51  --   --  3.82* 3.86*  HGB 11.6*  < > 10.2* 9.5*  9.6* 9.8*  HCT 36.4  < > 30.0* 28.0*  30.3* 31.1*  MCV 80.7  --   --  79.3 80.6  MCH 25.7*  --   --  25.1* 25.4*  MCHC 31.9  --   --  31.7 31.5  RDW 14.1  --   --  14.0 13.6  PLT 163  --   --  145* 165  < > = values in this interval not displayed.  Cardiac EnzymesNo results for input(s): TROPONINI in the last 168 hours. No results for input(s): TROPIPOC in the last 168 hours.   BNPNo results for input(s): BNP, PROBNP in the last 168 hours.   DDimer No results for input(s): DDIMER in the last 168 hours.   Radiology    Dg Chest Port 1 View  Result Date:  04/13/2017 CLINICAL DATA:  Status post aortic valve replacement. EXAM: PORTABLE CHEST 1 VIEW COMPARISON:  Radiograph of April 12, 2017. FINDINGS: Stable cardiomediastinal silhouette. No pneumothorax is noted. Atherosclerosis of thoracic aorta is noted. Left-sided pacemaker is unchanged in position. No acute pulmonary disease is noted. No pleural effusion is noted. Bony thorax is unremarkable. IMPRESSION: Aortic atherosclerosis.  No acute cardiopulmonary abnormality seen. Electronically Signed   By: Marijo Conception, M.D.   On: 04/13/2017 08:07   Dg Chest Port 1 View  Result Date: 04/12/2017 CLINICAL DATA:  Status post aortic valve repair EXAM: PORTABLE CHEST 1 VIEW COMPARISON:  04/08/2017 FINDINGS: Status post aortic valve repair. Left pacer is unchanged. Heart is borderline in size. Lungs are clear. No effusions or acute bony abnormality. IMPRESSION: Aortic valve repair.  No acute findings. Electronically Signed   By: Rolm Baptise M.D.   On: 04/12/2017 11:21    Cardiac Studies   POD #1 echo pending.   Patient Profile     83 y.o. female admitted for TAVR 04-13-2017  Assessment & Plan    1. Severe aortic stenosis: s/p TAVR, uncomplicated, treated with 23 mm Sapien 3 THV. Mobilize, 2D echo today, tx tele bed.  2. HTN: BP well-controlled on amlodipine and low-dose metoprolol.   3. CAD, native vessel: no angina. S/P PCI of the RCA 02/23/17. Continue DAPT With ASA and plavix.   4. Anemia: post-op mild blood loss/dilutional. Stable with HgB 9.8. Appears to be tolerating DAPT after recent UGI bleed.   5. CKD 4: creatinine stable POD #  1.   Dispo: tx tele today, Phase 1 cardiac rehab, anticipate DC tomorrow.   For questions or updates, please contact [place] Please consult [email or web address] for contact info under Cardiology/STEMI.     Deatra James, MD  04/13/2017, 8:51 AM

## 2017-04-13 NOTE — Progress Notes (Signed)
Inpatient Diabetes Program Recommendations  AACE/ADA: New Consensus Statement on Inpatient Glycemic Control (2015)  Target Ranges:  Prepandial:   less than 140 mg/dL      Peak postprandial:   less than 180 mg/dL (1-2 hours)      Critically ill patients:  140 - 180 mg/dL   Results for Molly Cobb, Molly Cobb (MRN [id number]) as of 04/13/2017 12:31  Ref. Range 04/12/2017 06:14 04/12/2017 20:07 04/13/2017 09:04 04/13/2017 11:51  Glucose-Capillary Latest Ref Range: 65 - 99 mg/dL 127 (H) 194 (H) 155 (H) 316 (H)   Review of Glycemic Control  Diabetes history: DM2 Outpatient Diabetes medications: None Current orders for Inpatient glycemic control: Novolog 0-9 units TID with meals, Novolog 0-5 units QHS  Inpatient Diabetes Program Recommendations: HgbA1C: A1C 7.4% on 04/08/17 to indicate an average glucose of 166 mg/dl over the past 2-3 months. Recommend patient follow up with PCP; may need outpatient oral DM medication.  Thanks, Barnie Alderman, RN, MSN, CDE Diabetes Coordinator Inpatient Diabetes Program [phone] (Team Pager from 8am to 5pm)

## 2017-04-13 NOTE — Progress Notes (Signed)
[facility]  Patient Status: Active (receiving services up to time of hospitalization)  [facility] is providing the following services: PT  If patient discharges after hours, please call [phone].   Molly Cobb 04/13/2017, 10:38 AM

## 2017-04-14 LAB — GLUCOSE, CAPILLARY: GLUCOSE-CAPILLARY: 121 mg/dL — AB (ref 65–99)

## 2017-04-14 LAB — BASIC METABOLIC PANEL
ANION GAP: 7 (ref 5–15)
BUN: 45 mg/dL — AB (ref 6–20)
CALCIUM: 8.4 mg/dL — AB (ref 8.9–10.3)
CO2: 22 mmol/L (ref 22–32)
Chloride: 109 mmol/L (ref 101–111)
Creatinine, Ser: 2.92 mg/dL — ABNORMAL HIGH (ref 0.44–1.00)
GFR calc Af Amer: 16 mL/min — ABNORMAL LOW (ref >60)
GFR, EST NON AFRICAN AMERICAN: 14 mL/min — AB (ref >60)
GLUCOSE: 143 mg/dL — AB (ref 65–99)
Potassium: 4.3 mmol/L (ref 3.5–5.1)
SODIUM: 138 mmol/L (ref 135–145)

## 2017-04-14 LAB — CBC
HCT: 28.9 % — ABNORMAL LOW (ref 36.0–46.0)
HEMOGLOBIN: 9.1 g/dL — AB (ref 12.0–15.0)
MCH: 25.4 pg — ABNORMAL LOW (ref 26.0–34.0)
MCHC: 31.5 g/dL (ref 30.0–36.0)
MCV: 80.7 fL (ref 78.0–100.0)
Platelets: 142 10*3/uL — ABNORMAL LOW (ref 150–400)
RBC: 3.58 MIL/uL — ABNORMAL LOW (ref 3.87–5.11)
RDW: 13.7 % (ref 11.5–15.5)
WBC: 8.4 10*3/uL (ref 4.0–10.5)

## 2017-04-14 MED ORDER — TRAMADOL HCL 50 MG PO TABS: 50.0000 mg | ORAL_TABLET | Freq: Two times a day (BID) | ORAL | Status: DC | PRN

## 2017-04-14 NOTE — Progress Notes (Signed)
CARDIAC REHAB PHASE I   PRE:  Rate/Rhythm: pacing 80  BP:  Supine:   Sitting: 120/48  Standing:    SaO2: 100%RA  MODE:  Ambulation: 370 ft   POST:  Rate/Rhythm: 101 paced   BP:  Supine:   Sitting: 164/63  Standing:    SaO2: 100%RA 0926-1008 Pt walked 370 ft on RA with hand held asst. Tolerated well. Reviewed carb counting since pt's A1C has gone up to 7.4. Gave ex ed and will refer to [facility]. Pt stopped once to rest while walking. To recliner with call bell.   Graylon Good, RN BSN  04/14/2017 10:04 AM

## 2017-04-14 NOTE — Discharge Summary (Signed)
[place] VALVE TEAM   Discharge Summary    Patient ID: Molly Cobb,  MRN: [id number], DOB/AGE: October 24, 1931 83 y.o.  Admit date: 04/12/2017 Discharge date: 04/14/2017  Primary Care Provider: Guadlupe Spanish Primary Cardiologist: Dr. Burt Knack   Discharge Diagnoses    Principal Problem:   S/P TAVR (transcatheter aortic valve replacement) Active Problems:   Benign hypertension with CKD (chronic kidney disease) stage IV (HCC)   Hypothyroidism (acquired)   Type 2 diabetes mellitus with stage 4 chronic kidney disease, without long-term current use of insulin (HCC)   Anemia   S/P placement of cardiac pacemaker   AVM (arteriovenous malformation) of stomach, acquired with hemorrhage   CKD (chronic kidney disease) stage 4, GFR 15-29 ml/min (HCC)   Severe aortic stenosis   Allergies Allergies  Allergen Reactions  . Oysters [Shellfish Allergy] Nausea And Vomiting  . Sulfa Antibiotics Nausea And Vomiting     History of Present Illness     Molly Cobb is a 83 y.o. female with a history of HTN, HLD, hypothyroidism, stage IV CKD, DMT2, s/p PPM, anemia with recent GI bleed, CAD s/p PCI/DES to the mid RCA in 01/2017 and severe AS who presented to [facility] on 04/12/17 for planned TAVR.    She was in her usual state of health until 02/13/2017 when she presented with new onset chest pain while going up 3 flights of stairs. It was associated with shortness of breath and lightheadedness. It resolved after a few minutes but returned and she came to the [facility] ED. She ruled out for MI. Her Hgb was 7.9 and was 11.8 in May. She was transfused 2 units and had heme positive stool. She had a Lexiscan Myoview on 8/20 which was low risk with inferior scar and minimal peri-infarct ischemia. LVEF was 48%. A 2D echo showed an LVEF of 55-60% with severe AS with a mean gradient of 61 mm Hg and peak of 93 mm Hg. She underwent cath on 8/22 showing 90% mid RCA stenosis and a 70%  distal LCX stenosis supplying a small OM3. EGD on 8/24 showed a single small, oozing AVM treated with APC. She then underwent PCI with orbital atherectomy and DES of the RCA on 02/23/2017. Since discharge she has been feeling ok with no significant SOB or chest discomfort but she is not doing much.  She was evaluated by the multidisciplinary valve team and ultimately referred for TAVR, which was scheduled for 04/12/17.  Hospital Course     Consultants: none  1. Severe aortic stenosis: s/p successful TAVR with a 23 mm Sapien 3 THV via the TF approach on 04/12/17. Post operative echo showed good valve placement with no PVL. She was previously on DAPT with ASA/plavix for recent stent placement, which will be continued for at least 1 year.   2. HTN: BP well-controlled on amlodipine and low-dose metoprolol.   3. CAD, native vessel: no angina. S/P PCI of the RCA 02/23/17. Continue DAPT with ASA and plavix.   4. Anemia: post-op mild blood loss/dilutional. Stable with HgB 9.8--> 9.1. Appears to be tolerating DAPT after recent UGI bleed. Will follow as outpatient.   5. CKD 4: creatinine with mild bump POD #2 2.6--> 2.92. Will repeat labs at 1 week TOC appointment.   6. S/p PPM: was previously followed by Dr. Lewayne Bunting who was now passed away. We may need to have her establish care with one our EP docs. We will discuss at outpatient appointment next  week.   7. DMT2: HgA1c is 7.5. She is not on any diabetic medications. Would recommend that she follow up with PCP as oral antihyperglycemics are likely indicated.   The patient has had an uncomplicated hospital course and is recovering well. The femoral catheter sites are stable. She has been seen by Dr. Burt Knack today and deemed ready for discharge home. All follow-up appointments have been scheduled. Discharge medications are listed below.  _____________  Discharge Vitals Blood pressure (!) 154/48, pulse 70, temperature 98.7 F (37.1 C), temperature  source Oral, resp. rate (!) 25, height 5\' 3"  (1.6 m), weight 187 lb 11.2 oz (85.1 kg), SpO2 96 %.  Filed Weights   04/12/17 0606 04/12/17 0616 04/14/17 0630  Weight: 185 lb (83.9 kg) 185 lb (83.9 kg) 187 lb 11.2 oz (85.1 kg)   VS:  BP (!) 154/48 (BP Location: Right Arm)   Pulse 70   Temp 98.7 F (37.1 C) (Oral)   Resp (!) 25   Ht 5\' 3"  (1.6 m)   Wt 187 lb 11.2 oz (85.1 kg)   SpO2 96%   BMI 33.25 kg/m    GEN: Well nourished, well developed, in no acute distress, obese, sitting up in chair.  HEENT: normal  Neck: no JVD, carotid bruits, or masses Cardiac: RRR. 2/6 early peaking systolic murmur @ RUSB. No rubs, or gallops,no edema  Respiratory:  clear to auscultation bilaterally, normal work of breathing GI: soft, nontender, nondistended, + BS MS: no deformity or atrophy  Skin: warm and dry, no rash. Bilateral groin sites clear with no hematoma or ecchymosis  Neuro:  Alert and Oriented x 3, Strength and sensation are intact Psych: euthymic mood, full affect   Labs & Radiologic Studies     CBC  Recent Labs  04/13/17 0143 04/14/17 0211  WBC 8.0 8.4  HGB 9.8* 9.1*  HCT 31.1* 28.9*  MCV 80.6 80.7  PLT 165 846*   Basic Metabolic Panel  Recent Labs  04/13/17 0143 04/14/17 0211  NA 137 138  K 4.4 4.3  CL 109 109  CO2 21* 22  GLUCOSE 139* 143*  BUN 49* 45*  CREATININE 2.60* 2.92*  CALCIUM 8.3* 8.4*  MG 1.7  --    Liver Function Tests No results for input(s): AST, ALT, ALKPHOS, BILITOT, PROT, ALBUMIN in the last 72 hours. No results for input(s): LIPASE, AMYLASE in the last 72 hours. Cardiac Enzymes No results for input(s): CKTOTAL, CKMB, CKMBINDEX, TROPONINI in the last 72 hours. BNP Invalid input(s): POCBNP D-Dimer No results for input(s): DDIMER in the last 72 hours. Hemoglobin A1C No results for input(s): HGBA1C in the last 72 hours. Fasting Lipid Panel No results for input(s): CHOL, HDL, LDLCALC, TRIG, CHOLHDL, LDLDIRECT in the last 72 hours. Thyroid  Function Tests No results for input(s): TSH, T4TOTAL, T3FREE, THYROIDAB in the last 72 hours.  Invalid input(s): FREET3  Dg Chest 2 View  Result Date: 04/08/2017 CLINICAL DATA:  PRE OP EXAM. TRANSCATHETER AORTIC VALVE REPLACEMENT, TRANSFEMORAL TO BE DONE ON 03/13/17. Pt denies all current chest complaints. Hx of HTN, diabetes, and CAD. Non-smoker. EXAM: CHEST  2 VIEW COMPARISON:  Chest CT, 03/07/2017 FINDINGS: Cardiac silhouette is normal in size. No mediastinal or hilar masses. No evidence of adenopathy. Left anterior chest wall pacemaker is stable leads position in the right atrium right ventricle. Clear lungs. No pleural effusion or pneumothorax. Skeletal structures are demineralized but intact. IMPRESSION: No active cardiopulmonary disease. Electronically Signed   By: Dedra Skeens.D.  On: 04/08/2017 15:59   Dg Chest Port 1 View  Result Date: 04/13/2017 CLINICAL DATA:  Status post aortic valve replacement. EXAM: PORTABLE CHEST 1 VIEW COMPARISON:  Radiograph of April 12, 2017. FINDINGS: Stable cardiomediastinal silhouette. No pneumothorax is noted. Atherosclerosis of thoracic aorta is noted. Left-sided pacemaker is unchanged in position. No acute pulmonary disease is noted. No pleural effusion is noted. Bony thorax is unremarkable. IMPRESSION: Aortic atherosclerosis.  No acute cardiopulmonary abnormality seen. Electronically Signed   By: Marijo Conception, M.D.   On: 04/13/2017 08:07   Dg Chest Port 1 View  Result Date: 04/12/2017 CLINICAL DATA:  Status post aortic valve repair EXAM: PORTABLE CHEST 1 VIEW COMPARISON:  04/08/2017 FINDINGS: Status post aortic valve repair. Left pacer is unchanged. Heart is borderline in size. Lungs are clear. No effusions or acute bony abnormality. IMPRESSION: Aortic valve repair.  No acute findings. Electronically Signed   By: Rolm Baptise M.D.   On: 04/12/2017 11:21     Diagnostic Studies/Procedures    TAVR OPERATIVE NOTE   Date of Procedure:                 04/12/2017  Procedure:        Transcatheter Aortic Valve Replacement - Percutaneous Right Transfemoral Approach             Edwards Sapien 3 THV (size 23 mm, model # 9600TFX, serial # [id number])  Pre-operative Echo Findings: ? Severe aortic stenosis ? Normal left ventricular systolic function  Post-operative Echo Findings: ? No paravalvular leak ? Normal left ventricular systolic function  _____________  Post operative echo 04/13/17 Study Conclusions - Left ventricle: The cavity size was normal. Wall thickness was   increased in a pattern of mild LVH. Systolic function was normal.   The estimated ejection fraction was in the range of 55% to 60%.   Wall motion was normal; there were no regional wall motion   abnormalities. Doppler parameters are consistent with abnormal   left ventricular relaxation (grade 1 diastolic dysfunction). - Aortic valve: A bioprosthesis was present. Valve area (VTI): 1.34   cm^2. Valve area (Vmax): 1.31 cm^2. - Mitral valve: Calcified annulus. Mildly thickened leaflets . - Pulmonary arteries: Systolic pressure was mildly increased. PA   peak pressure: 36 mm Hg (S). Impressions: - Normal LV systolic function; mild diastolic dysfunction; mild   LVH; s/p AVR with no AI and normal mean gradient of 11 mmHg.  Disposition   Pt is being discharged home today in good condition.  Follow-up Plans & Appointments    Follow-up Information    Eileen Stanford, PA-C. Go on 04/21/2017.   Specialties:  Cardiology, Radiology Why:  @ 3pm, please arrive at least 10 minutes early. Contact information: [place] [address] [phone]          Discharge Instructions    Amb Referral to Cardiac Rehabilitation    Complete by:  As directed    Referring to [facility] Phase 2   Diagnosis:  Valve Replacement   Valve:  Aortic      Discharge Medications     Medication List    TAKE these medications     amLODipine 10 MG tablet Commonly known as:  NORVASC Take 10 mg by mouth daily.   aspirin 81 MG chewable tablet Chew 1 tablet (81 mg total) by mouth daily.   clopidogrel 75 MG tablet Commonly known as:  PLAVIX Take 1 tablet (75 mg total) by mouth  daily.   levothyroxine 100 MCG tablet Commonly known as:  SYNTHROID, LEVOTHROID Take 100 mcg by mouth daily before breakfast.   metoprolol tartrate 25 MG tablet Commonly known as:  LOPRESSOR Take 0.5 tablets (12.5 mg total) by mouth 2 (two) times daily.   nitroGLYCERIN 0.4 MG SL tablet Commonly known as:  NITROSTAT Place 1 tablet (0.4 mg total) under the tongue every 5 (five) minutes as needed. What changed:  reasons to take this   pantoprazole 40 MG tablet Commonly known as:  PROTONIX Take 1 tablet (40 mg total) by mouth daily.   rosuvastatin 40 MG tablet Commonly known as:  CRESTOR Take 1 tablet (40 mg total) by mouth at bedtime.   sodium bicarbonate 650 MG tablet Take 650 mg by mouth 2 (two) times daily.         Outstanding Labs/Studies   BMET , CBC  Duration of Discharge Encounter   Greater than 30 minutes including physician time.  Signed, Angelena Form PA-C 04/14/2017, 10:51 AM  Patient seen, examined. Available data reviewed. Agree with findings, assessment, and plan as outlined by Nell Range, PA-C. The patient is alert and oriented, in no distress. Lung fields are clear. JVP is normal. Heart is regular rate and rhythm with a 2/6 ejection murmur at the right upper sternal border. There is no diastolic murmur. Abdomen is soft and nontender. There is no leg edema. Laboratory data is reviewed. The patient's echocardiogram from yesterday is reviewed and she has normal function of her transcatheter heart valve with a mean gradient of 11 mmHg and no significant paravalvular regurgitation. Plans for discharge home today with close outpatient follow-up as detailed above. The patient should remain on aspirin and  clopidogrel.  [name], M.D. 04/14/2017 12:04 PM

## 2017-04-14 NOTE — Discharge Instructions (Signed)

## 2017-04-15 ENCOUNTER — Telehealth: Payer: Self-pay | Admitting: Physician Assistant

## 2017-04-15 LAB — TYPE AND SCREEN
ABO/RH(D): O NEG
Antibody Screen: POSITIVE
DAT, IGG: NEGATIVE
UNIT DIVISION: 0
UNIT DIVISION: 0

## 2017-04-15 LAB — BPAM RBC
BLOOD PRODUCT EXPIRATION DATE: 201810302359
Blood Product Expiration Date: 201810292359
ISSUE DATE / TIME: 201810130901
Unit Type and Rh: 9500
Unit Type and Rh: 9500

## 2017-04-15 NOTE — Telephone Encounter (Signed)
      Patient contacted regarding discharge from hospital on 04/14/17.  Patient understands to follow up with provider Angelena Form on 10/25 at 3pm at [address].  Patient understands discharge instructions? yes Patient understands medications and regiment? Yes  Patient understands to bring all medications to this visit? yes  Spoke with daughter as patient did not answer. Patient has a dry cough and will try some OTC meds.     Angelena Form PA-C  [facility]

## 2017-04-20 NOTE — Progress Notes (Signed)
Cardiology Office Note    Date:  04/21/2017   ID:  Molly Cobb, DOB [birth year], MRN [id number]  PCP:  Guadlupe Spanish, MD  Cardiologist:  Dr. Burt Knack ( she was previoulsy followed in [facility] by Dr. Lewayne Bunting) / Dr. Ola Spurr (EP)  CC: [facility] appointment s/p TAVR  History of Present Illness:  Molly Cobb is a 83 y.o. female with a history of HTN, HLD, hypothyroidism,stage IV CKD, DMT2, s/p PPM, anemia with recent GI bleed, CAD s/p PCI/DES to the mid RCA in 01/2017 and severe AS s/p TAVR (04/12/17) who presents to clinic for follow up.   She was in her usual state of health until 02/13/2017 when she presented with new onset chest pain while going up 3 flights of stairs. It was associated with shortness of breath and lightheadedness. It resolved after a few minutes but returned and she came to the [facility] ED. She ruled out for MI. Her Hgb was 7.9 and was 11.8 in May. She was transfused 2 units and had heme positive stool. She had a Lexiscan Myoview on 8/20 which was low risk with inferior scar and minimal peri-infarct ischemia. LVEF was 48%. A 2D echo showed an LVEF of 55-60% with severe AS with a mean gradient of 61 mm Hg and peak of 93 mm Hg. She underwent cath on 8/22 showing 90% mid RCA stenosis and a 70% distal LCX stenosis supplying a small OM3. EGD on 8/24 showed a single small, oozing AVM treated with APC. She then underwent PCI with orbital atherectomy and DES of the RCA on 02/23/2017. Since discharge she has been feeling ok with no significant SOB or chest discomfort but she is not doing much.  She was evaluated by the multidisciplinary valve team and ultimately referred for TAVR. She underwent successful TAVR with a 23 mm Sapien 3 THV via the TF approach on 04/12/17. Post operative echo showed good valve placement with no PVL. She was previously on DAPT with ASA/plavix for recent stent placement, which was continued.  Today she presents to clinic for follow up. No CP or SOB. No LE edema,  orthopnea or PND. No dizziness or syncope. No blood in stool or urine. No palpitations. She is so pleased with how great she is feeling. She thinks she is going to try to establish care with a new cardiologist in [facility].     Past Medical History:  Diagnosis Date  . CKD (chronic kidney disease)    a. followed by Dr. Neta Ehlers at [facility]   . Coronary artery disease    02/23/17 PCI/DES to mRCA,  normal EF  . Depression   . Diabetes mellitus ([place])   . Gout   . Hypertension   . Hypothyroidism   . Presence of permanent cardiac pacemaker    a. followed by [facility]  . S/P TAVR (transcatheter aortic valve replacement) 04/12/2017   23 mm Edwards Sapien 3 transcatheter heart valve placed via percutaneous right transfemoral approach   . Severe aortic stenosis    a. diagnosed 01/2017 during admission for chest pain.   . Thyroid disease     Past Surgical History:  Procedure Laterality Date  . APPENDECTOMY    . CESAREAN SECTION    . CORONARY ATHERECTOMY N/A 02/23/2017   Procedure: CORONARY ATHERECTOMY;  Surgeon: [name], MD;  Location: [facility];  Service: Cardiovascular;  Laterality: N/A;  . ESOPHAGOGASTRODUODENOSCOPY N/A 02/18/2017   Procedure: ESOPHAGOGASTRODUODENOSCOPY (EGD);  Surgeon: Milus Banister, MD;  Location:  [place] [facility];  Service: Endoscopy;  Laterality: N/A;  . EYE SURGERY    . FEMUR FRACTURE SURGERY    . HOT HEMOSTASIS N/A 02/18/2017   Procedure: HOT HEMOSTASIS (ARGON PLASMA COAGULATION/BICAP);  Surgeon: Milus Banister, MD;  Location: [facility]COPY;  Service: Endoscopy;  Laterality: N/A;  . IR RADIOLOGY PERIPHERAL GUIDED IV START  03/07/2017  . IR US GUIDE VASC ACCESS RIGHT  03/07/2017  . RIGHT/LEFT HEART CATH AND CORONARY ANGIOGRAPHY N/A 02/16/2017   Procedure: RIGHT/LEFT HEART CATH AND CORONARY ANGIOGRAPHY;  Surgeon: Wellington Hampshire, MD;  Location: [place] [facility];  Service: Cardiovascular;  Laterality: N/A;  . ROTATOR CUFF REPAIR    . TEE WITHOUT  CARDIOVERSION N/A 04/12/2017   Procedure: TRANSESOPHAGEAL ECHOCARDIOGRAM (TEE);  Surgeon: [name], MD;  Location: [place];  Service: Open Heart Surgery;  Laterality: N/A;  . THYROID SURGERY    . TRANSCATHETER AORTIC VALVE REPLACEMENT, TRANSFEMORAL N/A 04/12/2017   Procedure: TRANSCATHETER AORTIC VALVE REPLACEMENT, TRANSFEMORAL;  Surgeon: [name], MD;  Location: [place];  Service: Open Heart Surgery;  Laterality: N/A;    Current Medications: Outpatient Medications Prior to Visit  Medication Sig Dispense Refill  . amLODipine (NORVASC) 10 MG tablet Take 10 mg by mouth daily.    Marland Kitchen aspirin 81 MG chewable tablet Chew 1 tablet (81 mg total) by mouth daily. 30 tablet 0  . clopidogrel (PLAVIX) 75 MG tablet Take 1 tablet (75 mg total) by mouth daily. 90 tablet 1  . levothyroxine (SYNTHROID, LEVOTHROID) 100 MCG tablet Take 100 mcg by mouth daily before breakfast.    . metoprolol tartrate (LOPRESSOR) 25 MG tablet Take 0.5 tablets (12.5 mg total) by mouth 2 (two) times daily. 90 tablet 0  . pantoprazole (PROTONIX) 40 MG tablet Take 1 tablet (40 mg total) by mouth daily. 30 tablet 5  . rosuvastatin (CRESTOR) 40 MG tablet Take 1 tablet (40 mg total) by mouth at bedtime. 90 tablet 1  . sodium bicarbonate 650 MG tablet Take 650 mg by mouth 2 (two) times daily.     . nitroGLYCERIN (NITROSTAT) 0.4 MG SL tablet Place 1 tablet (0.4 mg total) under the tongue every 5 (five) minutes as needed. (Patient taking differently: Place 0.4 mg under the tongue every 5 (five) minutes as needed for chest pain. ) 25 tablet 2   No facility-administered medications prior to visit.      Allergies:   Oysters [shellfish allergy] and Sulfa antibiotics   Social History   Social History  . Marital status: Widowed    Spouse name: N/A  . Number of children: N/A  . Years of education: N/A   Social History Main Topics  . Smoking status: Never Smoker  . Smokeless tobacco: Never Used  . Alcohol use No  . Drug use:  No  . Sexual activity: Not Asked   Other Topics Concern  . None   Social History Narrative  . None     Family History:  The patient's family history includes CAD in her mother; Diabetes in her father; Healthy in her sister and sister; Hypertension in her sister; Lung cancer in her sister; Stroke in her mother.      ROS:   Please see the history of present illness.    ROS All other systems reviewed and are negative.   PHYSICAL EXAM:   VS:  BP 140/80   Pulse 78   Ht 5\' 3"  (1.6 m)   Wt 186 lb 3.2 oz (84.5 kg)   SpO2  96%   BMI 32.98 kg/m    GEN: Well nourished, well developed, in no acute distress, obese HEENT: normal  Neck: no JVD, carotid bruits, or masses Cardiac: RRR; 2/6 SEM @ RUSB. No rubs, or gallops,no edema  Respiratory:  clear to auscultation bilaterally, normal work of breathing GI: soft, nontender, nondistended, + BS MS: no deformity or atrophy  Skin: warm and dry, no rash. Groin sites healing well.  Neuro:  Alert and Oriented x 3, Strength and sensation are intact Psych: euthymic mood, full affect    Wt Readings from Last 3 Encounters:  04/21/17 186 lb 3.2 oz (84.5 kg)  04/14/17 187 lb 11.2 oz (85.1 kg)  04/08/17 185 lb 1 oz (83.9 kg)      Studies/Labs Reviewed:   EKG:  EKG is NOT ordered today.   Recent Labs: 04/08/2017: ALT 83 04/13/2017: Magnesium 1.7 04/14/2017: BUN 45; Creatinine, Ser 2.92; Hemoglobin 9.1; Platelets 142; Potassium 4.3; Sodium 138   Lipid Panel No results found for: CHOL, TRIG, HDL, CHOLHDL, VLDL, LDLCALC, LDLDIRECT  Additional studies/ records that were reviewed today include:  TAVR OPERATIVE NOTE   Date of Procedure:04/12/2017  Procedure:   Transcatheter Aortic Valve Replacement - Percutaneous RightTransfemoral Approach Edwards Sapien 3 THV (size 45mm, model # 9600TFX, serial # [id number])  Pre-operative Echo Findings: ? Severe aortic stenosis ? Normalleft ventricular systolic  function  Post-operative Echo Findings: ? Noparavalvular leak ? Normalleft ventricular systolic function  _____________  Post operative echo 04/13/17 Study Conclusions - Left ventricle: The cavity size was normal. Wall thickness was increased in a pattern of mild LVH. Systolic function was normal. The estimated ejection fraction was in the range of 55% to 60%. Wall motion was normal; there were no regional wall motion abnormalities. Doppler parameters are consistent with abnormal left ventricular relaxation (grade 1 diastolic dysfunction). - Aortic valve: A bioprosthesis was present. Valve area (VTI): 1.34 cm^2. Valve area (Vmax): 1.31 cm^2. - Mitral valve: Calcified annulus. Mildly thickened leaflets . - Pulmonary arteries: Systolic pressure was mildly increased. PA peak pressure: 36 mm Hg (S). Impressions: - Normal LV systolic function; mild diastolic dysfunction; mild LVH; s/p AVR with no AI and normal mean gradient of 11 mmHg.   ASSESSMENT & PLAN:   Severe AS s/p TAVR: she is doing excellent s/p TAVR. Groin sites stable. Continue ASA/plavix. She will be seen back 11/7 for an echo and follow up.   HTN: BP borderline today. No changes made.   CAD: s/p PCI/DES of RCA on 02/23/17. Continue ASA/plavix, BB and statin   Anemia: she has follow up with GI in 04/2017. Will check a CBC today.   CKD stage 4: creat was slightly elevated above baseline at discharge to 2.92. Will check a BMET today   S/p [employer] PPM: she wishes to continue to follow with Dr. Ola Spurr and Theda Belfast PA-C in [facility]. She is due for a pacer check. I have encouraged her to make an appointment.   DMT2: follow up with PCP   Medication Adjustments/Labs and Tests Ordered: Current medicines are reviewed at length with the patient today.  Concerns regarding medicines are outlined above.  Medication changes, Labs and Tests ordered today are listed in the Patient Instructions  below. Patient Instructions  Medication Instructions:  Your physician recommends that you continue on your current medications as directed. Please refer to the Current Medication list given to you today.   Labwork: Bmet and Cbc today  Testing/Procedures: Your physician has requested that you have an  echocardiogram. Echocardiography is a painless test that uses sound waves to create images of your heart. It provides your doctor with information about the size and shape of your heart and how well your heart's chambers and valves are working. This procedure takes approximately one hour. There are no restrictions for this procedure. ( Already scheduled for 05/04/17)  Follow-Up: Follow up as planned with Bonney Leitz, PA on 05/04/17  Any Other Special Instructions Will Be Listed Below (If Applicable).     If you need a refill on your cardiac medications before your next appointment, please call your pharmacy.      Signed, Angelena Form, PA-C  04/21/2017 9:07 PM    [facility] [address], [place], [place]  [address] Phone: [phone]; Fax: [phone]

## 2017-04-21 ENCOUNTER — Encounter: Payer: Self-pay | Admitting: Physician Assistant

## 2017-04-21 ENCOUNTER — Ambulatory Visit (INDEPENDENT_AMBULATORY_CARE_PROVIDER_SITE_OTHER): Payer: Medicare Other | Admitting: Physician Assistant

## 2017-04-21 ENCOUNTER — Encounter (INDEPENDENT_AMBULATORY_CARE_PROVIDER_SITE_OTHER): Payer: Self-pay

## 2017-04-21 VITALS — BP 140/80 | HR 78 | Ht 63.0 in | Wt 186.2 lb

## 2017-04-21 DIAGNOSIS — I2510 Atherosclerotic heart disease of native coronary artery without angina pectoris: Secondary | ICD-10-CM | POA: Diagnosis not present

## 2017-04-21 DIAGNOSIS — I129 Hypertensive chronic kidney disease with stage 1 through stage 4 chronic kidney disease, or unspecified chronic kidney disease: Secondary | ICD-10-CM | POA: Diagnosis not present

## 2017-04-21 DIAGNOSIS — E118 Type 2 diabetes mellitus with unspecified complications: Secondary | ICD-10-CM

## 2017-04-21 DIAGNOSIS — Z950 Presence of cardiac pacemaker: Secondary | ICD-10-CM | POA: Diagnosis not present

## 2017-04-21 DIAGNOSIS — Z952 Presence of prosthetic heart valve: Secondary | ICD-10-CM | POA: Diagnosis not present

## 2017-04-21 DIAGNOSIS — N184 Chronic kidney disease, stage 4 (severe): Secondary | ICD-10-CM | POA: Diagnosis not present

## 2017-04-21 DIAGNOSIS — N189 Chronic kidney disease, unspecified: Secondary | ICD-10-CM | POA: Diagnosis not present

## 2017-04-21 DIAGNOSIS — D649 Anemia, unspecified: Secondary | ICD-10-CM

## 2017-04-21 MED ORDER — NITROGLYCERIN 0.4 MG SL SUBL: 0.4000 mg | SUBLINGUAL_TABLET | SUBLINGUAL | 3 refills | Status: DC | PRN

## 2017-04-21 NOTE — Patient Instructions (Addendum)
Medication Instructions:  Your physician recommends that you continue on your current medications as directed. Please refer to the Current Medication list given to you today.   Labwork: Bmet and Cbc today  Testing/Procedures: Your physician has requested that you have an echocardiogram. Echocardiography is a painless test that uses sound waves to create images of your heart. It provides your doctor with information about the size and shape of your heart and how well your heart's chambers and valves are working. This procedure takes approximately one hour. There are no restrictions for this procedure. ( Already scheduled for 05/04/17)  Follow-Up: Follow up as planned with Bonney Leitz, PA on 05/04/17  Any Other Special Instructions Will Be Listed Below (If Applicable).     If you need a refill on your cardiac medications before your next appointment, please call your pharmacy.

## 2017-04-22 LAB — CBC
HEMOGLOBIN: 11.4 g/dL (ref 11.1–15.9)
Hematocrit: 35.7 % (ref 34.0–46.6)
MCH: 25.2 pg — AB (ref 26.6–33.0)
MCHC: 31.9 g/dL (ref 31.5–35.7)
MCV: 79 fL (ref 79–97)
Platelets: 233 10*3/uL (ref 150–379)
RBC: 4.52 x10E6/uL (ref 3.77–5.28)
RDW: 14.4 % (ref 12.3–15.4)
WBC: 8.1 10*3/uL (ref 3.4–10.8)

## 2017-04-22 LAB — BASIC METABOLIC PANEL
BUN/Creatinine Ratio: 23 (ref 12–28)
BUN: 55 mg/dL — ABNORMAL HIGH (ref 8–27)
CALCIUM: 9.4 mg/dL (ref 8.7–10.3)
CHLORIDE: 104 mmol/L (ref 96–106)
CO2: 20 mmol/L (ref 20–29)
Creatinine, Ser: 2.38 mg/dL — ABNORMAL HIGH (ref 0.57–1.00)
GFR calc Af Amer: 21 mL/min/{1.73_m2} — ABNORMAL LOW (ref >59)
GFR, EST NON AFRICAN AMERICAN: 18 mL/min/{1.73_m2} — AB (ref >59)
GLUCOSE: 129 mg/dL — AB (ref 65–99)
POTASSIUM: 5 mmol/L (ref 3.5–5.2)
Sodium: 141 mmol/L (ref 134–144)

## 2017-05-04 ENCOUNTER — Ambulatory Visit: Payer: Medicare Other | Admitting: Physician Assistant

## 2017-05-04 ENCOUNTER — Other Ambulatory Visit (HOSPITAL_COMMUNITY): Payer: Medicare Other

## 2017-05-06 ENCOUNTER — Other Ambulatory Visit: Payer: Medicare Other

## 2017-05-09 NOTE — Progress Notes (Signed)
[place]                                       Cardiology Office Note    Date:  05/12/2017   ID:  Molly Cobb, DOB February 21, 1932, MRN [id number]  PCP:  Guadlupe Spanish, MD  Cardiologist: Dr. Burt Knack ( she was previoulsy followed in [facility] by Dr. Lewayne Bunting) / Dr. Ola Spurr (EP)  CC: 1 month s/p TAVR   History of Present Illness:  Molly Cobb is a 83 y.o. female with a history of HTN, HLD, hypothyroidism,stage IV CKD, DMT2, s/p PPM, anemia with recent GI bleed, CAD s/p PCI/DES to the mid RCA in 01/2017 and severe AS s/p TAVR (04/12/17) who presents to clinic for follow up.   She was in her usual state of health until 02/13/2017 when she presented with new onset chest pain while going up 3 flights of stairs. It was associated with shortness of breath and lightheadedness. It resolved after a few minutes but returned and she came to the [facility] ED. She ruled out for MI. Her Hgb was 7.9 and was 11.8 in May. She was transfused 2 units and had heme positive stool. She had a Lexiscan Myoview on 8/20 which was low risk with inferior scar and minimal peri-infarct ischemia. LVEF was 48%. A 2D echo showed an LVEF of 55-60% with severe AS with a mean gradient of 61 mm Hg and peak of 93 mm Hg. She underwent cath on 8/22 showing 90% mid RCA stenosis and a 70% distal LCX stenosis supplying a small OM3. EGD on 8/24 showed a single small, oozing AVM treated with APC. She then underwent PCI with orbital atherectomy and DES of the RCA on 02/23/2017. Since discharge she has been feeling ok with no significant SOB or chest discomfort but she is not doing much.  She was evaluated by the multidisciplinary valve team and ultimately referred for TAVR. She underwent successful TAVR with a 23 mm Sapien 3 THV via the TF approach on 04/12/17. Post operative echo showed good valve placement with no PVL. She was previously on DAPT with ASA/plavix for recent stent placement,  which was continued.  Today she presents to clinic for follow up. No CP or SOB. No LE edema, orthopnea or PND. No dizziness or syncope. No blood in stool or urine. No palpitations. She has been back to all normal activities with no limitations.     Past Medical History:  Diagnosis Date  . CKD (chronic kidney disease)    a. followed by Dr. Neta Ehlers at [facility]   . Coronary artery disease    02/23/17 PCI/DES to mRCA,  normal EF  . Depression   . Diabetes mellitus ([place])   . Gout   . Hypertension   . Hypothyroidism   . Presence of permanent cardiac pacemaker    a. followed by [facility]  . S/P TAVR (transcatheter aortic valve replacement) 04/12/2017   23 mm Edwards Sapien 3 transcatheter heart valve placed via percutaneous right transfemoral approach   . Severe aortic stenosis    a. diagnosed 01/2017 during admission for chest pain.   . Thyroid disease     Past Surgical History:  Procedure Laterality Date  . APPENDECTOMY    . CESAREAN SECTION    . CORONARY ATHERECTOMY N/A 02/23/2017   Procedure: CORONARY ATHERECTOMY;  Surgeon: [name],  MD;  Location: [place] CV LAB;  Service: Cardiovascular;  Laterality: N/A;  . ESOPHAGOGASTRODUODENOSCOPY N/A 02/18/2017   Procedure: ESOPHAGOGASTRODUODENOSCOPY (EGD);  Surgeon: Milus Banister, MD;  Location: [facility] ENDOSCOPY;  Service: Endoscopy;  Laterality: N/A;  . EYE SURGERY    . FEMUR FRACTURE SURGERY    . HOT HEMOSTASIS N/A 02/18/2017   Procedure: HOT HEMOSTASIS (ARGON PLASMA COAGULATION/BICAP);  Surgeon: Milus Banister, MD;  Location: [facility] ENDOSCOPY;  Service: Endoscopy;  Laterality: N/A;  . IR RADIOLOGY PERIPHERAL GUIDED IV START  03/07/2017  . IR US GUIDE VASC ACCESS RIGHT  03/07/2017  . RIGHT/LEFT HEART CATH AND CORONARY ANGIOGRAPHY N/A 02/16/2017   Procedure: RIGHT/LEFT HEART CATH AND CORONARY ANGIOGRAPHY;  Surgeon: Wellington Hampshire, MD;  Location: [place] CV LAB;  Service: Cardiovascular;  Laterality: N/A;  . ROTATOR CUFF REPAIR     . TEE WITHOUT CARDIOVERSION N/A 04/12/2017   Procedure: TRANSESOPHAGEAL ECHOCARDIOGRAM (TEE);  Surgeon: [name], MD;  Location: [place];  Service: Open Heart Surgery;  Laterality: N/A;  . THYROID SURGERY    . TRANSCATHETER AORTIC VALVE REPLACEMENT, TRANSFEMORAL N/A 04/12/2017   Procedure: TRANSCATHETER AORTIC VALVE REPLACEMENT, TRANSFEMORAL;  Surgeon: [name], MD;  Location: [place];  Service: Open Heart Surgery;  Laterality: N/A;    Current Medications: Outpatient Medications Prior to Visit  Medication Sig Dispense Refill  . amLODipine (NORVASC) 10 MG tablet Take 10 mg by mouth daily.    Marland Kitchen aspirin 81 MG chewable tablet Chew 1 tablet (81 mg total) by mouth daily. 30 tablet 0  . clopidogrel (PLAVIX) 75 MG tablet Take 1 tablet (75 mg total) by mouth daily. 90 tablet 1  . GLIPIZIDE XL 5 MG 24 hr tablet Take 5 mg daily by mouth.    . levothyroxine (SYNTHROID, LEVOTHROID) 100 MCG tablet Take 100 mcg by mouth daily before breakfast.    . metoprolol tartrate (LOPRESSOR) 25 MG tablet Take 0.5 tablets (12.5 mg total) by mouth 2 (two) times daily. 90 tablet 0  . nitroGLYCERIN (NITROSTAT) 0.4 MG SL tablet Place 1 tablet (0.4 mg total) under the tongue every 5 (five) minutes as needed for chest pain. 25 tablet 3  . pantoprazole (PROTONIX) 40 MG tablet Take 1 tablet (40 mg total) by mouth daily. 30 tablet 5  . rosuvastatin (CRESTOR) 40 MG tablet Take 1 tablet (40 mg total) by mouth at bedtime. 90 tablet 1  . sodium bicarbonate 650 MG tablet Take 650 mg by mouth 2 (two) times daily.      No facility-administered medications prior to visit.      Allergies:   Oysters [shellfish allergy] and Sulfa antibiotics   Social History   Socioeconomic History  . Marital status: Widowed    Spouse name: None  . Number of children: None  . Years of education: None  . Highest education level: None  Social Needs  . Financial resource strain: None  . Food insecurity - worry: None  . Food insecurity  - inability: None  . Transportation needs - medical: None  . Transportation needs - non-medical: None  Occupational History  . None  Tobacco Use  . Smoking status: Never Smoker  . Smokeless tobacco: Never Used  Substance and Sexual Activity  . Alcohol use: No  . Drug use: No  . Sexual activity: None  Other Topics Concern  . None  Social History Narrative  . None     Family History:  The patient's family history includes CAD in her mother; Diabetes in  her father; Healthy in her sister and sister; Hypertension in her sister; Lung cancer in her sister; Stroke in her mother.     ROS:   Please see the history of present illness.    ROS All other systems reviewed and are negative.   PHYSICAL EXAM:   VS:  BP 138/80   Pulse 87   Ht 5\' 3"  (1.6 m)   Wt 188 lb 6.4 oz (85.5 kg)   SpO2 99%   BMI 33.37 kg/m    GEN: Well nourished, well developed, in no acute distress, obese HEENT: normal  Neck: no JVD, carotid bruits, or masses Cardiac: RRR; 2/6 SEM @ RUSB. No rubs, or gallops,no edema  Respiratory:  clear to auscultation bilaterally, normal work of breathing GI: soft, nontender, nondistended, + BS MS: no deformity or atrophy  Skin: warm and dry, no rash Neuro:  Alert and Oriented x 3, Strength and sensation are intact Psych: euthymic mood, full affect   Wt Readings from Last 3 Encounters:  05/11/17 188 lb 6.4 oz (85.5 kg)  04/21/17 186 lb 3.2 oz (84.5 kg)  04/14/17 187 lb 11.2 oz (85.1 kg)      Studies/Labs Reviewed:   EKG:  EKG is ordered today.  The ekg ordered today demonstrates paced rhythm with occasional intrinsic beats  Recent Labs: 04/08/2017: ALT 83 04/13/2017: Magnesium 1.7 04/21/2017: BUN 55; Creatinine, Ser 2.38; Hemoglobin 11.4; Platelets 233; Potassium 5.0; Sodium 141   Lipid Panel No results found for: CHOL, TRIG, HDL, CHOLHDL, VLDL, LDLCALC, LDLDIRECT  Additional studies/ records that were reviewed today include:  TAVR OPERATIVE NOTE   Date of  Procedure:04/12/2017  Procedure:   Transcatheter Aortic Valve Replacement - Percutaneous RightTransfemoral Approach Edwards Sapien 3 THV (size 86mm, model # 9600TFX, serial # [id number])  Pre-operative Echo Findings: ? Severe aortic stenosis ? Normalleft ventricular systolic function  Post-operative Echo Findings: ? Noparavalvular leak ? Normalleft ventricular systolic function  _____________  Post operative echo: 04/13/17 Study Conclusions - Left ventricle: The cavity size was normal. Wall thickness was increased in a pattern of mild LVH. Systolic function was normal. The estimated ejection fraction was in the range of 55% to 60%. Wall motion was normal; there were no regional wall motion abnormalities. Doppler parameters are consistent with abnormal left ventricular relaxation (grade 1 diastolic dysfunction). - Aortic valve: A bioprosthesis was present. Valve area (VTI): 1.34 cm^2. Valve area (Vmax): 1.31 cm^2. - Mitral valve: Calcified annulus. Mildly thickened leaflets . - Pulmonary arteries: Systolic pressure was mildly increased. PA peak pressure: 36 mm Hg (S). Impressions: - Normal LV systolic function; mild diastolic dysfunction; mild LVH; s/p AVR with no AI and normal mean gradient of 11 mmHg.  _____________  2D ECHO: 05/11/17 Study Conclusions - Left ventricle: The cavity size was normal. Wall thickness was   increased in a pattern of moderate LVH. Systolic function was   normal. The estimated ejection fraction was in the range of 60%   to 65%. Although no diagnostic regional wall motion abnormality   was identified, this possibility cannot be completely excluded on   the basis of this study. Doppler parameters are consistent with   abnormal left ventricular relaxation (grade 1 diastolic   dysfunction). - Aortic valve: Bioprosthetic aortic valve s/p TAVR. No significant   stenosis. There was no  regurgitation. Mean gradient (S): 10 mm   Hg. Valve area (VTI): 1.93 cm^2. - Mitral valve: Mildly calcified annulus. There was trivial   regurgitation. - Left atrium: The atrium was  mildly dilated. - Right ventricle: The cavity size was normal. Pacer wire or   catheter noted in right ventricle. Systolic function was normal. - Tricuspid valve: Peak RV-RA gradient (S): 28 mm Hg. - Pulmonary arteries: PA peak pressure: 31 mm Hg (S). - Inferior vena cava: The vessel was normal in size. The   respirophasic diameter changes were in the normal range (>= 50%),   consistent with normal central venous pressure. Impressions - Normal LV size with moderate LV hypertrophy. EF 60-65%. Normal RV   size and systolic function. Normally functioning bioprosthetic   aortic valve s/p TAVR.  Aortic valve:  Bioprosthetic aortic valve s/p TAVR. No significant stenosis.  Doppler:  There was no regurgitation.    VTI ratio of LVOT to aortic valve: 0.56. Valve area (VTI): 1.93 cm^2. Indexed valve area (VTI): 0.98 cm^2/m^2. Peak velocity ratio of LVOT to aortic valve: 0.45. Valve area (Vmax): 1.56 cm^2. Indexed valve area (Vmax): 0.79 cm^2/m^2. Mean velocity ratio of LVOT to aortic valve: 0.46. Valve area (Vmean): 1.58 cm^2. Indexed valve area (Vmean): 0.8 cm^2/m^2.    Mean gradient (S): 10 mm Hg. Peak gradient (S): 19 mm Hg.   ASSESSMENT & PLAN:   Severe AS s/p TAVR: she has made excellent progress after TAVR. She has NHYA class I symptoms. 2D ECHO shows well seated TAVR valve with no AI and no AS; mean gradient 10 mm Hg and peak gradient 19 mm Hg. SBE prophylaxis discussed. She will continue on DAPT with plavix for at least 1 year s/p DES placement in 01/2017. I will see her back in clinic in 1 year with an echo.   HTN: BP with moderate control today. No changes made  CAD: s/p PCI/DES of RCA on 02/23/17. Continue ASA/plavix, BB and statin             Anemia: CBC has remained stable. She follows with GI 11/26  for follow up of AVMs  CKD stage 4: creat has remained stable around 2.38. She is followed by Dr. Neta Ehlers  S/p [employer] PPM: she wishes to continue to follow with Dr. Ola Spurr and Theda Belfast PA-C in [facility]. She is due for a pacer check. I have encouraged her to make an appointment.   DMT2: HgA1c is 7.5. She was recently started on Glipizide   Dispo: she will establish care with a cardiologist in [facility] and resume care with EP there. I told her we are happy to take care of her in the meantime. She will let us know if there are any issues with establishing care near her home. She should be seen by general cardiology in 3 months.    Medication Adjustments/Labs and Tests Ordered: Current medicines are reviewed at length with the patient today.  Concerns regarding medicines are outlined above.  Medication changes, Labs and Tests ordered today are listed in the Patient Instructions below. There are no Patient Instructions on file for this visit.   Signed, Angelena Form, PA-C  05/12/2017 12:14 PM    [facility] [address], [place], [place]  [address] Phone: [phone]; Fax: [phone]

## 2017-05-10 ENCOUNTER — Telehealth: Payer: Self-pay | Admitting: Cardiovascular Disease

## 2017-05-10 NOTE — Telephone Encounter (Signed)
LVXBOZW calling with [facility], states that patient is now ready for Cardiac Rehab and would like to have it done in [place]. She asks that you please f/u with patient.

## 2017-05-11 ENCOUNTER — Ambulatory Visit (HOSPITAL_COMMUNITY): Payer: Medicare Other | Attending: Cardiology

## 2017-05-11 ENCOUNTER — Other Ambulatory Visit: Payer: Self-pay

## 2017-05-11 ENCOUNTER — Encounter: Payer: Self-pay | Admitting: Physician Assistant

## 2017-05-11 ENCOUNTER — Ambulatory Visit (INDEPENDENT_AMBULATORY_CARE_PROVIDER_SITE_OTHER): Payer: Medicare Other | Admitting: Physician Assistant

## 2017-05-11 ENCOUNTER — Encounter (INDEPENDENT_AMBULATORY_CARE_PROVIDER_SITE_OTHER): Payer: Self-pay

## 2017-05-11 VITALS — BP 138/80 | HR 87 | Ht 63.0 in | Wt 188.4 lb

## 2017-05-11 DIAGNOSIS — I1310 Hypertensive heart and chronic kidney disease without heart failure, with stage 1 through stage 4 chronic kidney disease, or unspecified chronic kidney disease: Secondary | ICD-10-CM | POA: Diagnosis not present

## 2017-05-11 DIAGNOSIS — D649 Anemia, unspecified: Secondary | ICD-10-CM

## 2017-05-11 DIAGNOSIS — E785 Hyperlipidemia, unspecified: Secondary | ICD-10-CM | POA: Insufficient documentation

## 2017-05-11 DIAGNOSIS — Z952 Presence of prosthetic heart valve: Secondary | ICD-10-CM

## 2017-05-11 DIAGNOSIS — N184 Chronic kidney disease, stage 4 (severe): Secondary | ICD-10-CM | POA: Diagnosis not present

## 2017-05-11 DIAGNOSIS — N189 Chronic kidney disease, unspecified: Secondary | ICD-10-CM | POA: Insufficient documentation

## 2017-05-11 DIAGNOSIS — Z953 Presence of xenogenic heart valve: Secondary | ICD-10-CM | POA: Diagnosis not present

## 2017-05-11 DIAGNOSIS — Z950 Presence of cardiac pacemaker: Secondary | ICD-10-CM | POA: Insufficient documentation

## 2017-05-11 DIAGNOSIS — E118 Type 2 diabetes mellitus with unspecified complications: Secondary | ICD-10-CM

## 2017-05-11 DIAGNOSIS — I129 Hypertensive chronic kidney disease with stage 1 through stage 4 chronic kidney disease, or unspecified chronic kidney disease: Secondary | ICD-10-CM | POA: Diagnosis not present

## 2017-05-11 DIAGNOSIS — E1122 Type 2 diabetes mellitus with diabetic chronic kidney disease: Secondary | ICD-10-CM | POA: Insufficient documentation

## 2017-05-11 DIAGNOSIS — I350 Nonrheumatic aortic (valve) stenosis: Secondary | ICD-10-CM | POA: Diagnosis not present

## 2017-05-11 DIAGNOSIS — I2510 Atherosclerotic heart disease of native coronary artery without angina pectoris: Secondary | ICD-10-CM | POA: Diagnosis not present

## 2017-05-11 DIAGNOSIS — Z8249 Family history of ischemic heart disease and other diseases of the circulatory system: Secondary | ICD-10-CM | POA: Diagnosis not present

## 2017-05-11 NOTE — Telephone Encounter (Signed)
Patient was evaluated by Nell Range, PA today.  She is to establish care with cardiologist in [place] and will follow-up for Cardiac Rehab if wanted through new Cardiologist.

## 2017-05-12 ENCOUNTER — Telehealth: Payer: Self-pay

## 2017-05-12 NOTE — Telephone Encounter (Signed)
Thank you! I spoke with her :)

## 2017-05-12 NOTE — Telephone Encounter (Signed)
Patient returned call by Nell Range yesterday. She states they got cut off and she thought Joellen Jersey had something else to tell her.  She requests a call back from [place] to finish discussion.

## 2017-05-18 ENCOUNTER — Encounter: Payer: Self-pay | Admitting: Thoracic Surgery (Cardiothoracic Vascular Surgery)

## 2017-05-23 ENCOUNTER — Ambulatory Visit: Payer: Medicare Other | Admitting: Gastroenterology

## 2017-05-23 ENCOUNTER — Telehealth: Payer: Self-pay | Admitting: Gastroenterology

## 2017-05-23 NOTE — Telephone Encounter (Signed)
No charge. 

## 2017-06-22 ENCOUNTER — Other Ambulatory Visit: Payer: Self-pay | Admitting: Cardiology

## 2017-06-29 ENCOUNTER — Other Ambulatory Visit: Payer: Self-pay | Admitting: Cardiology

## 2017-07-01 ENCOUNTER — Other Ambulatory Visit: Payer: Self-pay | Admitting: Cardiology

## 2017-10-13 IMAGING — CR DG CHEST 2V
2 series · 2 of 2 positions shown · non-contrast
Comparison: Chest CT, 03/07/2017

CLINICAL DATA: PRE OP EXAM. TRANSCATHETER AORTIC VALVE REPLACEMENT,
TRANSFEMORAL TO BE DONE ON 03/13/17. Pt denies all current chest
complaints. Hx of HTN, diabetes, and CAD. Non-smoker.

EXAM:
CHEST  2 VIEW

[w chest pa]
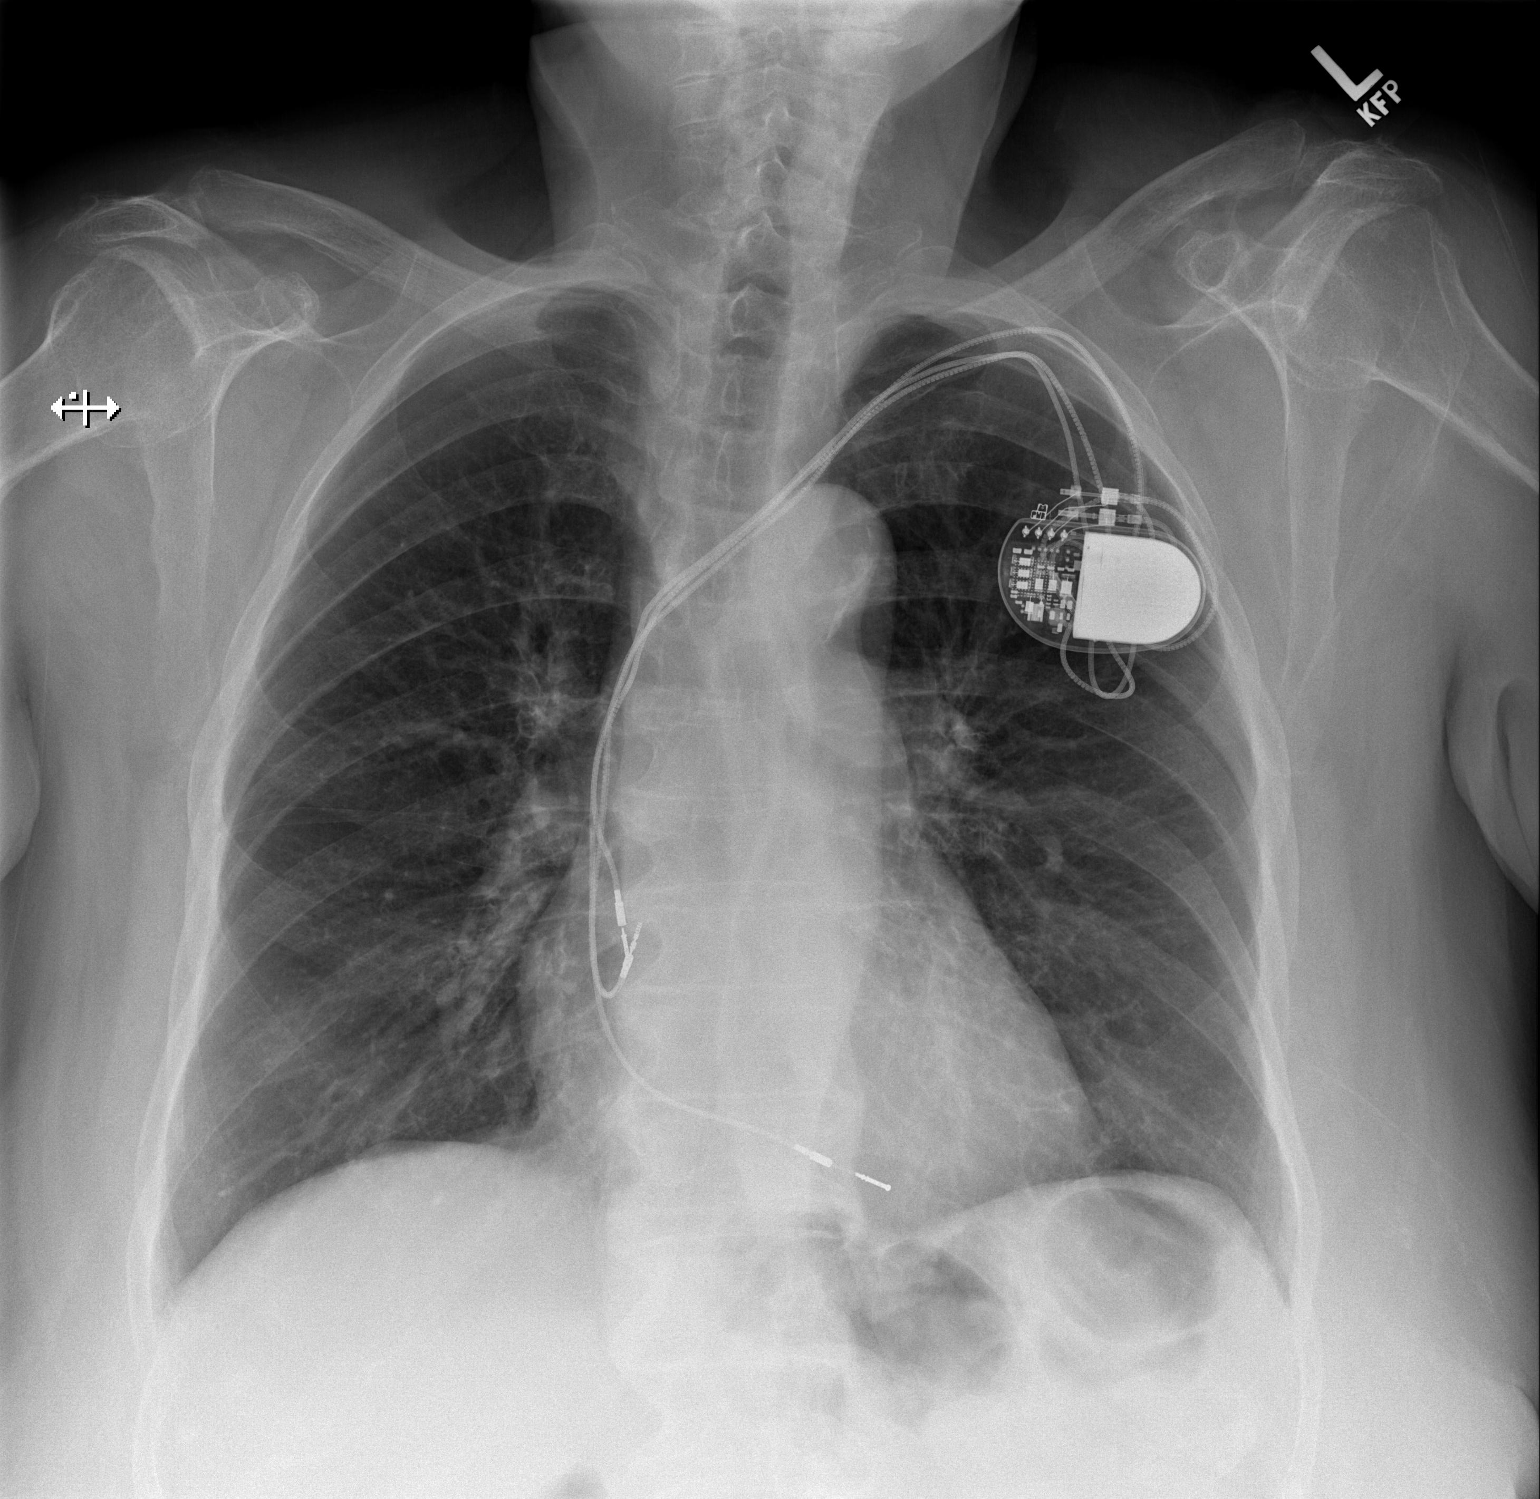

[w chest lat]
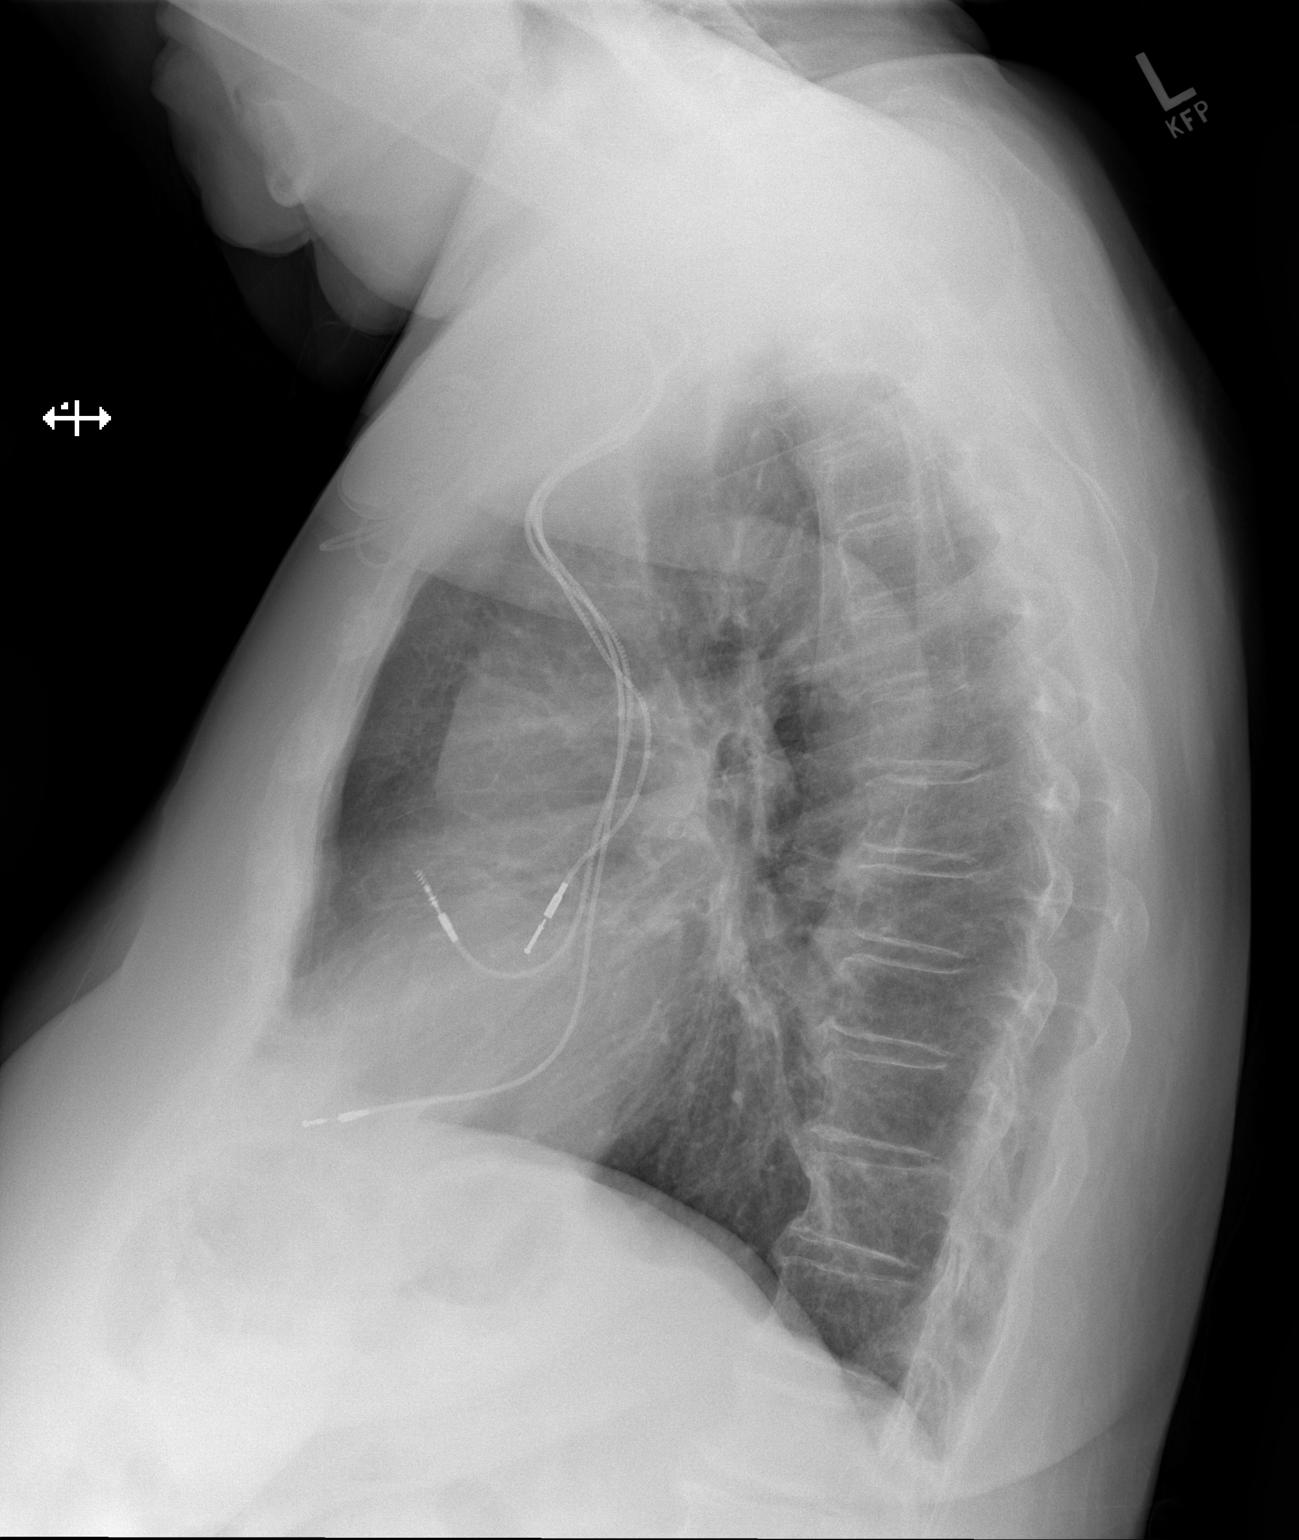

[2 of 2 positions shown; findings below may reference images not displayed]

FINDINGS: Cardiac silhouette is normal in size. No mediastinal or hilar
masses. No evidence of adenopathy.

Left anterior chest wall pacemaker is stable leads position in the
right atrium right ventricle.

Clear lungs.

No pleural effusion or pneumothorax.

Skeletal structures are demineralized but intact.
IMPRESSION: No active cardiopulmonary disease.

## 2017-10-17 IMAGING — DX DG CHEST 1V PORT
1 series · 1 of 1 positions shown · non-contrast
Comparison: 04/08/2017

CLINICAL DATA: Status post aortic valve repair

EXAM:
PORTABLE CHEST 1 VIEW

[chest]
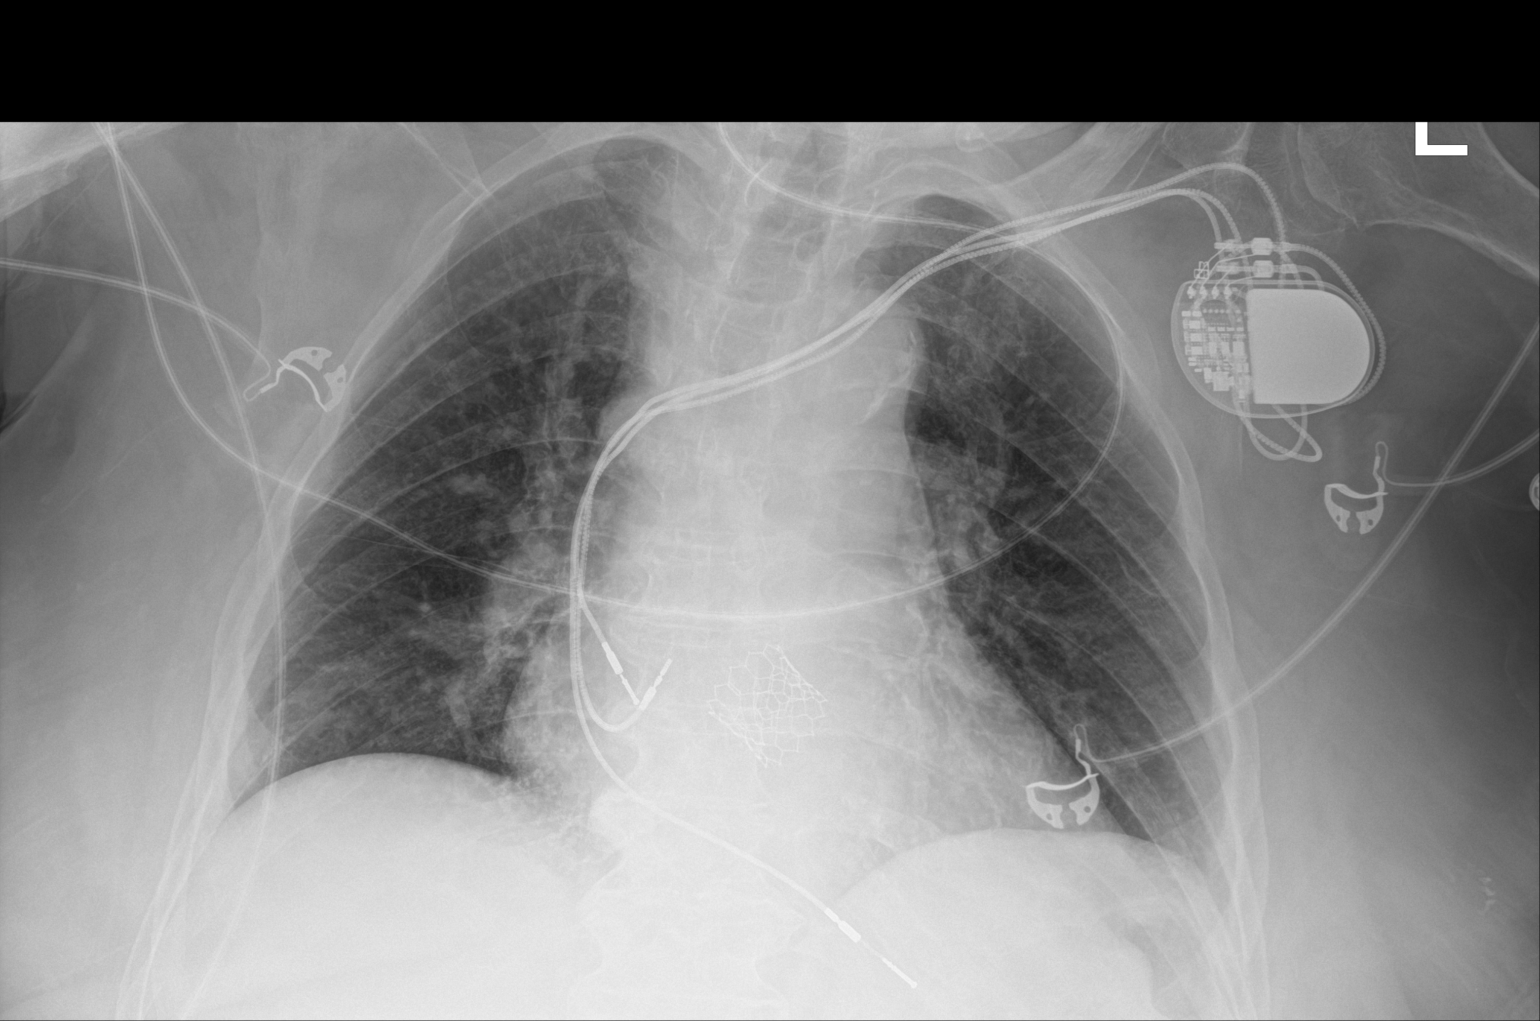

[1 of 1 positions shown; findings below may reference images not displayed]

FINDINGS: Status post aortic valve repair. Left pacer is unchanged. Heart is
borderline in size. Lungs are clear. No effusions or acute bony
abnormality.
IMPRESSION: Aortic valve repair.  No acute findings.

## 2017-10-18 IMAGING — DX DG CHEST 1V PORT
1 series · 1 of 1 positions shown · non-contrast
Comparison: Radiograph April 12, 2017.

CLINICAL DATA: Status post aortic valve replacement.

EXAM:
PORTABLE CHEST 1 VIEW

[chest]
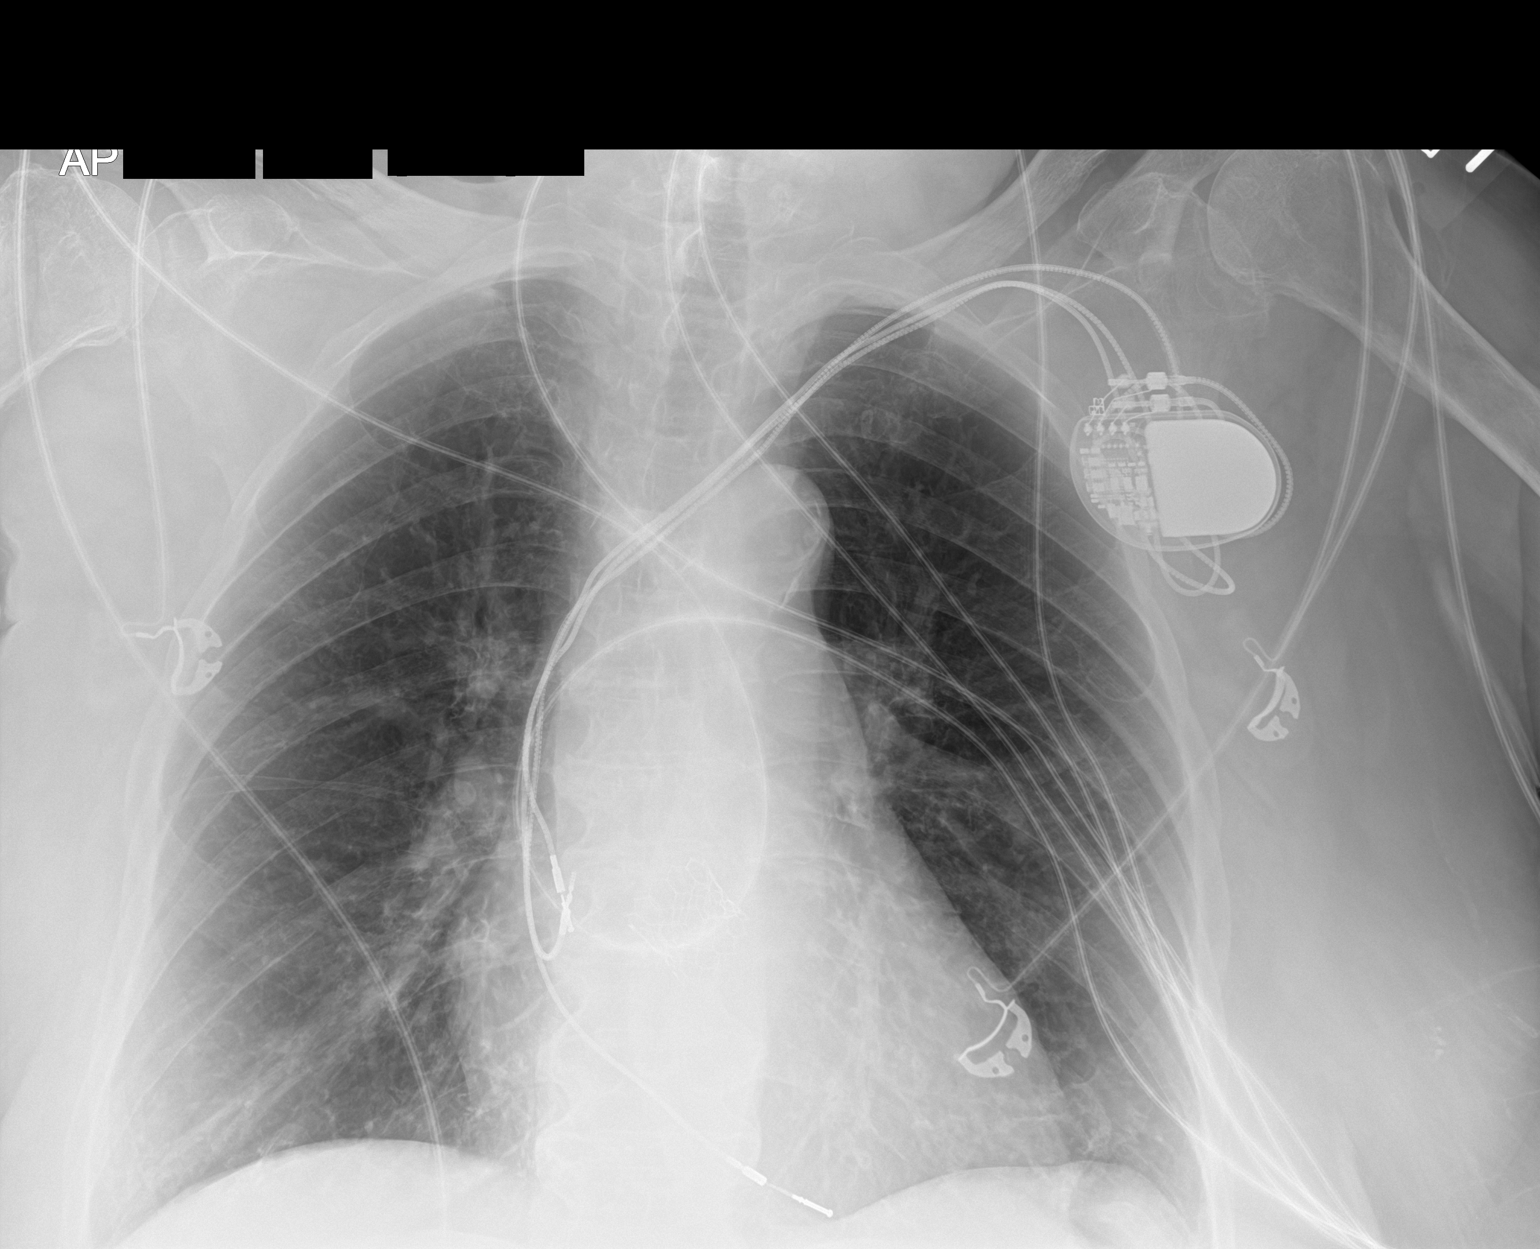

[1 of 1 positions shown; findings below may reference images not displayed]

FINDINGS: Stable cardiomediastinal silhouette. No pneumothorax is noted.
Atherosclerosis of thoracic aorta is noted. Left-sided pacemaker is
unchanged in position. No acute pulmonary disease is noted. No
pleural effusion is noted. Bony thorax is unremarkable.
IMPRESSION: Aortic atherosclerosis.  No acute cardiopulmonary abnormality seen.

## 2018-01-26 ENCOUNTER — Other Ambulatory Visit: Payer: Self-pay | Admitting: Physician Assistant

## 2018-01-26 DIAGNOSIS — Z952 Presence of prosthetic heart valve: Secondary | ICD-10-CM

## 2018-04-12 ENCOUNTER — Other Ambulatory Visit (HOSPITAL_COMMUNITY): Payer: Medicare Other

## 2018-04-12 ENCOUNTER — Ambulatory Visit: Payer: Medicare Other | Admitting: Physician Assistant

## 2018-04-14 ENCOUNTER — Ambulatory Visit (HOSPITAL_COMMUNITY): Payer: Medicare Other | Attending: Cardiology

## 2018-04-14 ENCOUNTER — Other Ambulatory Visit: Payer: Self-pay

## 2018-04-14 DIAGNOSIS — Z952 Presence of prosthetic heart valve: Secondary | ICD-10-CM | POA: Diagnosis not present

## 2018-04-26 NOTE — Progress Notes (Addendum)
[place]                                       Cardiology Office Note    Date:  04/27/2018   ID:  Molly Cobb, DOB May 10, 1932, MRN [id number]  PCP:  Guadlupe Spanish, MD  Cardiologist: Dr. Quillian Quince (HP) / Dr. Burt Knack & Dr. Roxy Manns (TAVR)  CC: 1 year s/p TAVR   History of Present Illness:  Molly Cobb is a 84 y.o. female with a history of HTN, HLD, hypothyroidism,stage IV CKD, DMT2, s/p PPM, anemia with GI bleed, CAD s/p PCI/DES to the mid RCA in 01/2017 and severe ASs/p TAVR (04/12/17) who presents to clinic for follow up.  She was in her usual state of health until 02/13/2017 when she presented with chest pain. Her Hgb was 7.9. She was transfused 2 units and had heme positive stool. She had a Lexiscan Myoview on 8/20 which was low risk with inferior scar and minimal peri-infarct ischemia. LVEF was 48%. A 2D echo showed an LVEF of 55-60% with severe AS with a mean gradient of 61 mm Hg and peak of 93 mm Hg. She underwent cath on 8/22 showing 90% mid RCA stenosis and a 70% distal LCX stenosis supplying a small OM3. EGD on 8/24 showed a single small, oozing AVM treated with APC. She then underwent PCI with orbital atherectomy and DES of the RCA on 02/23/2017  She was evaluated by the multidisciplinary valve team and ultimately referred for TAVR. She underwentsuccessful TAVR with a 23 mm Sapien 3 THV via the TF approach on 04/12/17. Post operative echo showed good valve placement with no PVL. She was previously on DAPT with ASA/plavix for recent stent placement, whichwas continued.   1 month echo showed a well seated TAVR valve with no AI and no AS; mean gradient 10 mm Hg and peak gradient 19 mm Hg.   She has established care with Dr. Quillian Quince in [facility] and has been doing well.   Today she presents to clinic for follow up. No CP or SOB. No LE edema, orthopnea or PND. No dizziness or syncope. No blood in stool or urine. She has been getting  recurrent palpitations that are associate with a funny feeling in her chest. She took a SL NTG for this recently which dropped her BP. These episodes last at most 15 minutes and then usually resolve. They occur 3-4 times a month. She has been wanting to work out at the fitness center at [facility] but the referral hasn't been placed. She used to walk outside but stopped when the weather got too hot.    Past Medical History:  Diagnosis Date  . CKD (chronic kidney disease)    a. followed by Dr. Neta Ehlers at [facility]   . Coronary artery disease    02/23/17 PCI/DES to mRCA,  normal EF  . Depression   . Diabetes mellitus ([place])   . Gout   . Hypertension   . Hypothyroidism   . Presence of permanent cardiac pacemaker    a. followed by [facility]  . S/P TAVR (transcatheter aortic valve replacement) 04/12/2017   23 mm Edwards Sapien 3 transcatheter heart valve placed via percutaneous right transfemoral approach   . Severe aortic stenosis    a. diagnosed 01/2017 during admission for chest pain.   . Thyroid disease  Past Surgical History:  Procedure Laterality Date  . APPENDECTOMY    . CESAREAN SECTION    . CORONARY ATHERECTOMY N/A 02/23/2017   Procedure: CORONARY ATHERECTOMY;  Surgeon: [name], MD;  Location: [place] [facility];  Service: Cardiovascular;  Laterality: N/A;  . ESOPHAGOGASTRODUODENOSCOPY N/A 02/18/2017   Procedure: ESOPHAGOGASTRODUODENOSCOPY (EGD);  Surgeon: Milus Banister, MD;  Location: [facility] ENDOSCOPY;  Service: Endoscopy;  Laterality: N/A;  . EYE SURGERY    . FEMUR FRACTURE SURGERY    . HOT HEMOSTASIS N/A 02/18/2017   Procedure: HOT HEMOSTASIS (ARGON PLASMA COAGULATION/BICAP);  Surgeon: Milus Banister, MD;  Location: [facility] ENDOSCOPY;  Service: Endoscopy;  Laterality: N/A;  . IR RADIOLOGY PERIPHERAL GUIDED IV START  03/07/2017  . IR US GUIDE VASC ACCESS RIGHT  03/07/2017  . RIGHT/LEFT HEART CATH AND CORONARY ANGIOGRAPHY N/A 02/16/2017   Procedure: RIGHT/LEFT HEART CATH AND CORONARY  ANGIOGRAPHY;  Surgeon: Wellington Hampshire, MD;  Location: [place] [facility] LAB;  Service: Cardiovascular;  Laterality: N/A;  . ROTATOR CUFF REPAIR    . TEE WITHOUT CARDIOVERSION N/A 04/12/2017   Procedure: TRANSESOPHAGEAL ECHOCARDIOGRAM (TEE);  Surgeon: [name], MD;  Location: [place];  Service: Open Heart Surgery;  Laterality: N/A;  . THYROID SURGERY    . TRANSCATHETER AORTIC VALVE REPLACEMENT, TRANSFEMORAL N/A 04/12/2017   Procedure: TRANSCATHETER AORTIC VALVE REPLACEMENT, TRANSFEMORAL;  Surgeon: [name], MD;  Location: [place];  Service: Open Heart Surgery;  Laterality: N/A;    Current Medications: Outpatient Medications Prior to Visit  Medication Sig Dispense Refill  . amLODipine (NORVASC) 10 MG tablet Take 10 mg by mouth daily.    Marland Kitchen aspirin 81 MG chewable tablet Chew 1 tablet (81 mg total) by mouth daily. 30 tablet 0  . Coenzyme Q10 (COQ-10) 100 MG CAPS Take 100 mg by mouth daily.    Marland Kitchen GLIPIZIDE XL 5 MG 24 hr tablet Take 5 mg daily by mouth.    . levothyroxine (SYNTHROID, LEVOTHROID) 100 MCG tablet Take 100 mcg by mouth daily before breakfast.    . metoprolol tartrate (LOPRESSOR) 25 MG tablet TAKE 1/2 (ONE-HALF) TABLET BY MOUTH TWICE DAILY 90 tablet 0  . nitroGLYCERIN (NITROSTAT) 0.4 MG SL tablet Place 1 tablet (0.4 mg total) under the tongue every 5 (five) minutes as needed for chest pain. 25 tablet 3  . pantoprazole (PROTONIX) 40 MG tablet Take 1 tablet (40 mg total) by mouth daily. 30 tablet 5  . rosuvastatin (CRESTOR) 40 MG tablet TAKE 1 TABLET BY MOUTH ONCE DAILY AT BEDTIME 90 tablet 3  . sodium bicarbonate 650 MG tablet Take 650 mg by mouth 2 (two) times daily.     . clopidogrel (PLAVIX) 75 MG tablet Take 1 tablet (75 mg total) by mouth daily. 90 tablet 1   No facility-administered medications prior to visit.      Allergies:   Oysters [shellfish allergy] and Sulfa antibiotics   Social History   Socioeconomic History  . Marital status: Widowed    Spouse name: Not  on file  . Number of children: Not on file  . Years of education: Not on file  . Highest education level: Not on file  Occupational History  . Not on file  Social Needs  . Financial resource strain: Not on file  . Food insecurity:    Worry: Not on file    Inability: Not on file  . Transportation needs:    Medical: Not on file    Non-medical: Not on file  Tobacco Use  .  Smoking status: Never Smoker  . Smokeless tobacco: Never Used  Substance and Sexual Activity  . Alcohol use: No  . Drug use: No  . Sexual activity: Not on file  Lifestyle  . Physical activity:    Days per week: Not on file    Minutes per session: Not on file  . Stress: Not on file  Relationships  . Social connections:    Talks on phone: Not on file    Gets together: Not on file    Attends religious service: Not on file    Active member of club or organization: Not on file    Attends meetings of clubs or organizations: Not on file    Relationship status: Not on file  Other Topics Concern  . Not on file  Social History Narrative  . Not on file     Family History:  The patient's family history includes CAD in her mother; Diabetes in her father; Healthy in her sister and sister; Hypertension in her sister; Lung cancer in her sister; Stroke in her mother.     ROS:   Please see the history of present illness.    ROS All other systems reviewed and are negative.   PHYSICAL EXAM:   VS:  BP 138/78   Pulse 88   Ht 5\' 3"  (1.6 m)   Wt 195 lb 12.8 oz (88.8 kg)   SpO2 97%   BMI 34.68 kg/m    GEN: Well nourished, well developed, in no acute distress, obese HEENT: normal Neck: no JVD or masses Cardiac: RRR; 2/6 SEM. No rubs, or gallops. Trace L LE edema  Respiratory:  clear to auscultation bilaterally, normal work of breathing GI: soft, nontender, nondistended, + BS MS: no deformity or atrophy Skin: warm and dry, no rash.  Neuro:  Alert and Oriented x 3, Strength and sensation are intact Psych: euthymic  mood, full affect   Wt Readings from Last 3 Encounters:  04/27/18 195 lb 12.8 oz (88.8 kg)  05/11/17 188 lb 6.4 oz (85.5 kg)  04/21/17 186 lb 3.2 oz (84.5 kg)      Studies/Labs Reviewed:   EKG:  EKG is NOT ordered today.    Recent Labs: No results found for requested labs within last 8760 hours.   Lipid Panel No results found for: CHOL, TRIG, HDL, CHOLHDL, VLDL, LDLCALC, LDLDIRECT  Additional studies/ records that were reviewed today include:  TAVR OPERATIVE NOTE   Date of Procedure:04/12/2017  Procedure:   Transcatheter Aortic Valve Replacement - Percutaneous RightTransfemoral Approach Edwards Sapien 3 THV (size 55mm, model # 9600TFX, serial # [id number])  Pre-operative Echo Findings: ? Severe aortic stenosis ? Normalleft ventricular systolic function  Post-operative Echo Findings: ? Noparavalvular leak ? Normalleft ventricular systolic function  _____________  2D ECHO: 05/11/17 Study Conclusions - Left ventricle: The cavity size was normal. Wall thickness was increased in a pattern of moderate LVH. Systolic function was normal. The estimated ejection fraction was in the range of 60% to 65%. Although no diagnostic regional wall motion abnormality was identified, this possibility cannot be completely excluded on the basis of this study. Doppler parameters are consistent with abnormal left ventricular relaxation (grade 1 diastolic dysfunction). - Aortic valve: Bioprosthetic aortic valve s/p TAVR. No significant stenosis. There was no regurgitation. Mean gradient (S): 10 mm Hg. Valve area (VTI): 1.93 cm^2. - Mitral valve: Mildly calcified annulus. There was trivial regurgitation. - Left atrium: The atrium was mildly dilated. - Right ventricle: The cavity size was normal. Pacer  wire or catheter noted in right ventricle. Systolic function was normal. - Tricuspid valve: Peak RV-RA gradient (S): 28  mm Hg. - Pulmonary arteries: PA peak pressure: 31 mm Hg (S). - Inferior vena cava: The vessel was normal in size. The respirophasic diameter changes were in the normal range (>= 50%), consistent with normal central venous pressure. Impressions - Normal LV size with moderate LV hypertrophy. EF 60-65%. Normal RV size and systolic function. Normally functioning bioprosthetic aortic valve s/p TAVR.  _____________   Echo 04/14/18 ( 1 year s/p TAVR) Study Conclusions - Left ventricle: The cavity size was normal. There was mild   concentric hypertrophy. Systolic function was vigorous. The   estimated ejection fraction was in the range of 65% to 70%. Wall   motion was normal; there were no regional wall motion   abnormalities. Doppler parameters are consistent with abnormal   left ventricular relaxation (grade 1 diastolic dysfunction).   Doppler parameters are consistent with elevated ventricular   end-diastolic filling pressure. - Aortic valve: Mean gradient (S): 15 mm Hg. Peak gradient (S): 28   mm Hg. - Mitral valve: There was trivial regurgitation. - Right ventricle: Systolic function was normal. - Right atrium: Pacer wire or catheter noted in right atrium. - Tricuspid valve: There was moderate regurgitation. - Pulmonary arteries: Systolic pressure was moderately increased.   PA peak pressure: 43 mm Hg (S). - Inferior vena cava: The vessel was normal in size. - Pericardium, extracardiac: There was no pericardial effusion. Impressions: - Since the last study on 05/11/2017 mean transaortic gradient has   slightly increased from 10 to 15 mmHg, but still in normal range.   Peak velocity 2.65 m/s.    ASSESSMENT & PLAN:   Severe AS s/p TAVR: echo 04/14/18 showed normally functioning TAVR with slightly increased gradients but still in an acceptable range (mean gradient 10 mm Hg to 15 mm Hg) and no PVL. She has NYHA class I symptoms, but hasn't been as active as she used to  be. Wants to start working out at the fitness center at [facility]. SBE prophylaxis discussed. Plavix can be discontinued now and she will stay on Aspirin 81 mg daily.   HTN:BP well controlled today  CAD:s/p PCI with DES to RCA in 01/2017. Plan to stop plavix now. Continue on Aspirin and statin   CKD stage 4: this is followed by Dr. Neta Ehlers.  S/p [employer] QPR:[id number] by Dr. Ola Spurr and Theda Belfast PA-C in [facility].  Palpitations: consider device interrogation by electrophysiologist.    Medication Adjustments/Labs and Tests Ordered: Current medicines are reviewed at length with the patient today.  Concerns regarding medicines are outlined above.  Medication changes, Labs and Tests ordered today are listed in the Patient Instructions below. Patient Instructions  Medication Instructions:  Your physician has recommended you make the following change in your medication: Stop Clopidogrel   If you need a refill on your cardiac medications before your next appointment, please call your pharmacy.   Lab work: none If you have labs (blood work) drawn today and your tests are completely normal, you will receive your results only by: Marland Kitchen MyChart Message (if you have MyChart) OR . A paper copy in the mail If you have any lab test that is abnormal or we need to change your treatment, we will call you to review the results.  Testing/Procedures: none  Follow-Up: Follow up with Dr. Quillian Quince regarding palpitations.     Signed, Angelena Form, PA-C  04/27/2018 2:09 PM  [facility] [address], [place], [place]  [address] Phone: [phone]; Fax: [phone]

## 2018-04-27 ENCOUNTER — Encounter: Payer: Self-pay | Admitting: Physician Assistant

## 2018-04-27 ENCOUNTER — Ambulatory Visit: Payer: Medicare Other | Admitting: Physician Assistant

## 2018-04-27 VITALS — BP 138/78 | HR 88 | Ht 63.0 in | Wt 195.8 lb

## 2018-04-27 DIAGNOSIS — Z950 Presence of cardiac pacemaker: Secondary | ICD-10-CM

## 2018-04-27 DIAGNOSIS — N189 Chronic kidney disease, unspecified: Secondary | ICD-10-CM | POA: Diagnosis not present

## 2018-04-27 DIAGNOSIS — I129 Hypertensive chronic kidney disease with stage 1 through stage 4 chronic kidney disease, or unspecified chronic kidney disease: Secondary | ICD-10-CM | POA: Diagnosis not present

## 2018-04-27 DIAGNOSIS — I2510 Atherosclerotic heart disease of native coronary artery without angina pectoris: Secondary | ICD-10-CM

## 2018-04-27 DIAGNOSIS — N184 Chronic kidney disease, stage 4 (severe): Secondary | ICD-10-CM

## 2018-04-27 DIAGNOSIS — Z952 Presence of prosthetic heart valve: Secondary | ICD-10-CM | POA: Diagnosis not present

## 2018-04-27 NOTE — Patient Instructions (Addendum)
Medication Instructions:  Your physician has recommended you make the following change in your medication: Stop Clopidogrel   If you need a refill on your cardiac medications before your next appointment, please call your pharmacy.   Lab work: none If you have labs (blood work) drawn today and your tests are completely normal, you will receive your results only by: Marland Kitchen MyChart Message (if you have MyChart) OR . A paper copy in the mail If you have any lab test that is abnormal or we need to change your treatment, we will call you to review the results.  Testing/Procedures: none  Follow-Up: Follow up with Dr. Quillian Quince regarding palpitations.

## 2018-05-04 ENCOUNTER — Encounter: Payer: Self-pay | Admitting: Thoracic Surgery (Cardiothoracic Vascular Surgery)

## 2018-05-05 ENCOUNTER — Encounter: Payer: Self-pay | Admitting: *Deleted

## 2019-09-29 ENCOUNTER — Emergency Department (HOSPITAL_BASED_OUTPATIENT_CLINIC_OR_DEPARTMENT_OTHER): Payer: Medicare Other

## 2019-09-29 ENCOUNTER — Emergency Department (HOSPITAL_BASED_OUTPATIENT_CLINIC_OR_DEPARTMENT_OTHER)
Admission: EM | Admit: 2019-09-29 | Discharge: 2019-09-29 | Disposition: A | Discharge status: Home / Self Care | Payer: Medicare Other | Attending: Emergency Medicine | Admitting: Emergency Medicine

## 2019-09-29 ENCOUNTER — Other Ambulatory Visit: Payer: Self-pay

## 2019-09-29 ENCOUNTER — Encounter (HOSPITAL_BASED_OUTPATIENT_CLINIC_OR_DEPARTMENT_OTHER): Payer: Self-pay

## 2019-09-29 DIAGNOSIS — Z79899 Other long term (current) drug therapy: Secondary | ICD-10-CM | POA: Diagnosis not present

## 2019-09-29 DIAGNOSIS — E039 Hypothyroidism, unspecified: Secondary | ICD-10-CM | POA: Insufficient documentation

## 2019-09-29 DIAGNOSIS — E1122 Type 2 diabetes mellitus with diabetic chronic kidney disease: Secondary | ICD-10-CM | POA: Diagnosis not present

## 2019-09-29 DIAGNOSIS — N184 Chronic kidney disease, stage 4 (severe): Secondary | ICD-10-CM | POA: Diagnosis not present

## 2019-09-29 DIAGNOSIS — M79605 Pain in left leg: Secondary | ICD-10-CM | POA: Diagnosis not present

## 2019-09-29 DIAGNOSIS — Z7984 Long term (current) use of oral hypoglycemic drugs: Secondary | ICD-10-CM | POA: Insufficient documentation

## 2019-09-29 DIAGNOSIS — Z7982 Long term (current) use of aspirin: Secondary | ICD-10-CM | POA: Diagnosis not present

## 2019-09-29 DIAGNOSIS — M25562 Pain in left knee: Secondary | ICD-10-CM | POA: Insufficient documentation

## 2019-09-29 DIAGNOSIS — I129 Hypertensive chronic kidney disease with stage 1 through stage 4 chronic kidney disease, or unspecified chronic kidney disease: Secondary | ICD-10-CM | POA: Diagnosis not present

## 2019-09-29 DIAGNOSIS — I2510 Atherosclerotic heart disease of native coronary artery without angina pectoris: Secondary | ICD-10-CM | POA: Insufficient documentation

## 2019-09-29 DIAGNOSIS — Z950 Presence of cardiac pacemaker: Secondary | ICD-10-CM | POA: Diagnosis not present

## 2019-09-29 MED ORDER — DICLOFENAC SODIUM 1 % EX GEL: 2.0000 g | Freq: Four times a day (QID) | CUTANEOUS | 0 refills | Status: DC

## 2019-09-29 NOTE — ED Provider Notes (Signed)
Molly Cobb   CSN: [id number] Arrival date & time: 09/29/19  1256     History Chief Complaint  Patient presents with   Leg Pain    Molly Cobb is a 86 y.o. female.  Patient is a 86 year old female who presents with left leg pain.  She noticed a knot behind her left knee 2 days ago and its been sore.  When she walks it feels like it is a little wobbly.  She denies any known injuries.  No noticeable swelling to her lower leg.  No numbness or weakness to the leg.  No fevers.        Past Medical History:  Diagnosis Date   CKD (chronic kidney disease)    a. followed by Dr. Neta Ehlers at [facility]    Coronary artery disease    02/23/17 PCI/DES to mRCA,  normal EF   Depression    Diabetes mellitus ([place])    Gout    Hypertension    Hypothyroidism    Presence of permanent cardiac pacemaker    a. followed by [facility]   S/P TAVR (transcatheter aortic valve replacement) 04/12/2017   23 mm Edwards Sapien 3 transcatheter heart valve placed via percutaneous right transfemoral approach    Severe aortic stenosis    a. diagnosed 01/2017 during admission for chest pain.    Thyroid disease     Patient Active Problem List   Diagnosis Date Noted   Severe aortic stenosis 04/12/2017   S/P TAVR (transcatheter aortic valve replacement) 04/12/2017   CKD (chronic kidney disease) stage 4, GFR 15-29 ml/min ([place]) 03/23/2017   AVM (arteriovenous malformation) of stomach, acquired with hemorrhage    S/P placement of cardiac pacemaker 02/16/2017   Anemia 02/13/2017   Hypothyroidism (acquired) 06/29/2016   Type 2 diabetes mellitus with stage 4 chronic kidney disease, without long-term current use of insulin ([place]) 09/09/2015   Benign hypertension with CKD (chronic kidney disease) stage IV ([place]) 08/02/2015    Past Surgical History:  Procedure Laterality Date   APPENDECTOMY     CESAREAN SECTION     CORONARY ATHERECTOMY N/A 02/23/2017     Procedure: CORONARY ATHERECTOMY;  Surgeon: [name], MD;  Location: [facility];  Service: Cardiovascular;  Laterality: N/A;   ESOPHAGOGASTRODUODENOSCOPY N/A 02/18/2017   Procedure: ESOPHAGOGASTRODUODENOSCOPY (EGD);  Surgeon: Milus Banister, MD;  Location: [facility];  Service: Endoscopy;  Laterality: N/A;   EYE SURGERY     FEMUR FRACTURE SURGERY     HOT HEMOSTASIS N/A 02/18/2017   Procedure: HOT HEMOSTASIS (ARGON PLASMA COAGULATION/BICAP);  Surgeon: Milus Banister, MD;  Location: [facility] ENDOSCOPY;  Service: Endoscopy;  Laterality: N/A;   IR RADIOLOGY PERIPHERAL GUIDED IV START  03/07/2017   IR US GUIDE VASC ACCESS RIGHT  03/07/2017   RIGHT/LEFT HEART CATH AND CORONARY ANGIOGRAPHY N/A 02/16/2017   Procedure: RIGHT/LEFT HEART CATH AND CORONARY ANGIOGRAPHY;  Surgeon: Wellington Hampshire, MD;  Location: [facility];  Service: Cardiovascular;  Laterality: N/A;   ROTATOR CUFF REPAIR     TEE WITHOUT CARDIOVERSION N/A 04/12/2017   Procedure: TRANSESOPHAGEAL ECHOCARDIOGRAM (TEE);  Surgeon: [name], MD;  Location: [place];  Service: Open Heart Surgery;  Laterality: N/A;   THYROID SURGERY     TRANSCATHETER AORTIC VALVE REPLACEMENT, TRANSFEMORAL N/A 04/12/2017   Procedure: TRANSCATHETER AORTIC VALVE REPLACEMENT, TRANSFEMORAL;  Surgeon: [name], MD;  Location: [place];  Service: Open Heart Surgery;  Laterality: N/A;     OB History  No obstetric history on file.     Family History  Problem Relation Age of Onset   CAD Mother        MI in her mid 54's   Stroke Mother        Light stroke   Diabetes Father    Hypertension Sister    Healthy Sister    Healthy Sister    Lung cancer Sister    Heart disease Neg Hx     Social History   Tobacco Use   Smoking status: Never Smoker   Smokeless tobacco: Never Used  Substance Use Topics   Alcohol use: No   Drug use: No    Home Medications Prior to Admission medications   Medication Sig Start  Date End Date Taking? Authorizing Provider  amLODipine (NORVASC) 10 MG tablet Take 10 mg by mouth daily.    [provider]  aspirin 81 MG chewable tablet Chew 1 tablet (81 mg total) by mouth daily. 02/25/17   Cheryln Manly, NP  Coenzyme Q10 (COQ-10) 100 MG CAPS Take 100 mg by mouth daily.    [provider]  diclofenac Sodium (VOLTAREN) 1 % GEL Apply 2 g topically 4 (four) times daily. 09/29/19   Malvin Johns, MD  GLIPIZIDE XL 5 MG 24 hr tablet Take 5 mg daily by mouth. 04/27/17   [provider]  levothyroxine (SYNTHROID, LEVOTHROID) 100 MCG tablet Take 100 mcg by mouth daily before breakfast.    [provider]  metoprolol tartrate (LOPRESSOR) 25 MG tablet TAKE 1/2 (ONE-HALF) TABLET BY MOUTH TWICE DAILY 06/22/17   Reino Bellis B, NP  nitroGLYCERIN (NITROSTAT) 0.4 MG SL tablet Place 1 tablet (0.4 mg total) under the tongue every 5 (five) minutes as needed for chest pain. 04/21/17   Eileen Stanford, PA-C  pantoprazole (PROTONIX) 40 MG tablet Take 1 tablet (40 mg total) by mouth daily. 03/23/17   Daune Perch, NP  rosuvastatin (CRESTOR) 40 MG tablet TAKE 1 TABLET BY MOUTH ONCE DAILY AT BEDTIME 07/01/17   [name], MD  sodium bicarbonate 650 MG tablet Take 650 mg by mouth 2 (two) times daily.     [provider]    Allergies    Oysters [shellfish allergy] and Sulfa antibiotics  Review of Systems   Review of Systems  Constitutional: Negative for fever.  Gastrointestinal: Negative for nausea and vomiting.  Musculoskeletal: Positive for arthralgias and joint swelling. Negative for back pain and neck pain.  Skin: Negative for wound.  Neurological: Negative for weakness, numbness and headaches.    Physical Exam Updated Vital Signs BP (!) 137/59 (BP Location: Left Arm)    Pulse 73    Temp 98.6 F (37 C)    Resp 18    Ht 5\' 3"  (1.6 m)    Wt 92.1 kg    SpO2 100%    BMI 35.96 kg/m   Physical Exam Constitutional:      Appearance:  She is well-developed.  HENT:     Head: Normocephalic and atraumatic.  Cardiovascular:     Rate and Rhythm: Normal rate.  Pulmonary:     Effort: Pulmonary effort is normal.  Musculoskeletal:        General: Tenderness present.     Cervical back: Normal range of motion and neck supple.     Comments: Patient has about a 2 cm soft area of swelling to the posterior medial aspect of her left knee.  There is no warmth or erythema.  No suggestions  of infection.  She has some tenderness on palpation the posterior knee but no other bony tenderness.  No calf swelling or tenderness.  Pedal pulses are intact bilaterally by Doppler.  She has normal warmth to the feet bilaterally.  Skin:    General: Skin is warm and dry.  Neurological:     Mental Status: She is alert and oriented to person, place, and time.     ED Results / Procedures / Treatments   Labs (all labs ordered are listed, but only abnormal results are displayed) Labs Reviewed - No data to display  EKG None  Radiology US Venous Img Lower Unilateral Left (DVT)  Result Date: 09/29/2019 CLINICAL DATA:  Left medial thigh pain for 2 days with no known injury. History of varicose veins. EXAM: LEFT LOWER EXTREMITY VENOUS DOPPLER ULTRASOUND TECHNIQUE: Gray-scale sonography with compression, as well as color and duplex ultrasound, were performed to evaluate the deep venous system(s) from the level of the common femoral vein through the popliteal and proximal calf veins. COMPARISON:  None. FINDINGS: VENOUS Normal compressibility of the common femoral, superficial femoral, and popliteal veins, as well as the visualized calf veins. Visualized portions of profunda femoral vein and great saphenous vein unremarkable. No filling defects to suggest DVT on grayscale or color Doppler imaging. Doppler waveforms show normal direction of venous flow, normal respiratory phasicity and response to augmentation. Limited views of the contralateral common femoral vein  are unremarkable. OTHER Limited imaging of the peroneal vein. Limitations: none IMPRESSION: No femoropopliteal DVT nor evidence of DVT within the visualized calf veins. If clinical symptoms are inconsistent or if there are persistent or worsening symptoms, further imaging (possibly involving the iliac veins) may be warranted. Electronically Signed   By: Lajean Manes M.D.   On: 09/29/2019 14:44   DG Knee Complete 4 Views Left  Result Date: 09/29/2019 CLINICAL DATA:  86 year old female with left posterior knee pain for the past 2 days. No known injury. EXAM: LEFT KNEE - COMPLETE 4+ VIEW COMPARISON:  None. FINDINGS: No evidence of acute fracture or malalignment. No knee joint effusion. Degenerative osteoarthritis is present in all 3 compartments but most significant in the medial compartment were there is loss of joint space, subchondral sclerosis, subchondral cyst formation and early osteophyte formation. No lytic or blastic osseous lesion. Atherosclerotic calcifications are visualized in the superficial femoral, popliteal and runoff arteries. IMPRESSION: 1. Medial compartment dominant degenerative osteoarthritis. 2. No acute fracture, malalignment or knee joint effusion. 3. Atherosclerotic vascular calcifications consistent with peripheral arterial disease. Electronically Signed   By: Jacqulynn Cadet M.D.   On: 09/29/2019 14:50    Procedures Procedures (including critical care time)  Medications Ordered in ED Medications - No data to display  ED Course  I have reviewed the triage vital signs and the nursing notes.  Pertinent labs & imaging results that were available during my care of the patient were reviewed by me and considered in my medical decision making (see chart for details).    MDM Rules/Calculators/A&P                      Patient is a 86 year old female who presents with pain behind her left knee.  There is a small area of swelling.  It is tender but not inflamed.  There is no  redness, warmth or suggestions of infection.  It could be a little cyst or a lipoma.  Ultrasound was performed which shows no evidence of DVT.  X-rays show  arthritic disease but no acute abnormalities.  An Ace wrap was placed around the knee.  She was given a prescription for Voltaren gel.  She was encouraged to follow-up next week with her PCP for recheck.  Return precautions were given. Final Clinical Impression(s) / ED Diagnoses Final diagnoses:  Acute pain of left knee    Rx / DC Orders ED Discharge Orders         Ordered    diclofenac Sodium (VOLTAREN) 1 % GEL  4 times daily     09/29/19 1457           Malvin Johns, MD 09/29/19 1459

## 2019-09-29 NOTE — ED Triage Notes (Signed)
Pt c/o pain in upper posterior left leg starting yesterday, reports it makes her feel like she may fall. States that she rubbed some cream on it and felt a knot.

## 2019-12-20 ENCOUNTER — Emergency Department (HOSPITAL_BASED_OUTPATIENT_CLINIC_OR_DEPARTMENT_OTHER): Payer: Medicare Other

## 2019-12-20 ENCOUNTER — Encounter (HOSPITAL_BASED_OUTPATIENT_CLINIC_OR_DEPARTMENT_OTHER): Payer: Self-pay | Admitting: *Deleted

## 2019-12-20 ENCOUNTER — Emergency Department (HOSPITAL_BASED_OUTPATIENT_CLINIC_OR_DEPARTMENT_OTHER)
Admission: EM | Admit: 2019-12-20 | Discharge: 2019-12-20 | Disposition: A | Discharge status: Home / Self Care | Payer: Medicare Other | Attending: Emergency Medicine | Admitting: Emergency Medicine

## 2019-12-20 ENCOUNTER — Other Ambulatory Visit: Payer: Self-pay

## 2019-12-20 DIAGNOSIS — S0003XA Contusion of scalp, initial encounter: Secondary | ICD-10-CM | POA: Diagnosis not present

## 2019-12-20 DIAGNOSIS — Y9301 Activity, walking, marching and hiking: Secondary | ICD-10-CM | POA: Diagnosis not present

## 2019-12-20 DIAGNOSIS — Y92009 Unspecified place in unspecified non-institutional (private) residence as the place of occurrence of the external cause: Secondary | ICD-10-CM | POA: Diagnosis not present

## 2019-12-20 DIAGNOSIS — W010XXA Fall on same level from slipping, tripping and stumbling without subsequent striking against object, initial encounter: Secondary | ICD-10-CM | POA: Diagnosis not present

## 2019-12-20 DIAGNOSIS — S0990XA Unspecified injury of head, initial encounter: Secondary | ICD-10-CM | POA: Diagnosis present

## 2019-12-20 DIAGNOSIS — Y999 Unspecified external cause status: Secondary | ICD-10-CM | POA: Diagnosis not present

## 2019-12-20 DIAGNOSIS — N184 Chronic kidney disease, stage 4 (severe): Secondary | ICD-10-CM | POA: Diagnosis not present

## 2019-12-20 DIAGNOSIS — Z79899 Other long term (current) drug therapy: Secondary | ICD-10-CM | POA: Diagnosis not present

## 2019-12-20 DIAGNOSIS — W109XXA Fall (on) (from) unspecified stairs and steps, initial encounter: Secondary | ICD-10-CM | POA: Diagnosis not present

## 2019-12-20 DIAGNOSIS — I1310 Hypertensive heart and chronic kidney disease without heart failure, with stage 1 through stage 4 chronic kidney disease, or unspecified chronic kidney disease: Secondary | ICD-10-CM | POA: Insufficient documentation

## 2019-12-20 DIAGNOSIS — E1122 Type 2 diabetes mellitus with diabetic chronic kidney disease: Secondary | ICD-10-CM | POA: Diagnosis not present

## 2019-12-20 DIAGNOSIS — W19XXXA Unspecified fall, initial encounter: Secondary | ICD-10-CM

## 2019-12-20 DIAGNOSIS — M25512 Pain in left shoulder: Secondary | ICD-10-CM | POA: Insufficient documentation

## 2019-12-20 MED ORDER — ACETAMINOPHEN 500 MG PO TABS
1000.0000 mg | ORAL_TABLET | Freq: Once | ORAL | Status: AC
  Administered 2019-12-20: 1000 mg via ORAL
  Filled 2019-12-20: qty 2

## 2019-12-20 MED ORDER — DICLOFENAC SODIUM 1 % EX GEL: 4.0000 g | Freq: Four times a day (QID) | CUTANEOUS | 0 refills | Status: DC

## 2019-12-20 MED FILL — DICLOFENAC SODIUM 1 % GEL: 1 | 7 days supply | Qty: 100 | Fill #0

## 2019-12-20 NOTE — Discharge Instructions (Addendum)
Take tylenol 1000mg (2 extra strength) four times a day.   Use the gel as prescribed.

## 2019-12-20 NOTE — ED Triage Notes (Signed)
Yesterday she tripped and fell going into the house. She hit the left side of her eye with bruise noted. No LOC. She drove here. She does not take blood thinners. Pain in her left shoulder, left hand and both knees. Alert and oriented.

## 2019-12-20 NOTE — ED Provider Notes (Signed)
[facility] EMERGENCY DEPARTMENT Provider Note   CSN: [id number] Arrival date & time: 12/20/19  1300     History Chief Complaint  Patient presents with  . Fall    Molly Cobb is a 86 y.o. female.  86 yo F with chief complaints of a fall.  This happened yesterday.  The patient was walking at someone's house and she has a gait disturbance that she needs to step up onto a step in a certain way and she did it incorrectly and she fell.  Struck the left side of her head and landed on her left shoulder scraped her left lateral hand and both of her knees.  No confusion no vomiting no blood thinner use.  Has been able to ambulate without difficulty.  Denies chest pain denies shortness of breath denies abdominal pain denies back pain.  Feels that her neck does hurt her little bit mostly on the left side and radiates down to the shoulder.  The history is provided by the patient.  Fall This is a new problem. The current episode started 2 days ago. The problem occurs constantly. The problem has not changed since onset.Associated symptoms include headaches. Pertinent negatives include no chest pain, no abdominal pain and no shortness of breath. Nothing aggravates the symptoms. Nothing relieves the symptoms. She has tried nothing for the symptoms. The treatment provided no relief.       Past Medical History:  Diagnosis Date  . CKD (chronic kidney disease)    a. followed by Dr. Neta Ehlers at [facility]   . Coronary artery disease    02/23/17 PCI/DES to mRCA,  normal EF  . Depression   . Diabetes mellitus ([place])   . Gout   . Hypertension   . Hypothyroidism   . Presence of permanent cardiac pacemaker    a. followed by [facility]  . S/P TAVR (transcatheter aortic valve replacement) 04/12/2017   23 mm Edwards Sapien 3 transcatheter heart valve placed via percutaneous right transfemoral approach   . Severe aortic stenosis    a. diagnosed 01/2017 during admission for chest pain.   . Thyroid  disease     Patient Active Problem List   Diagnosis Date Noted  . Severe aortic stenosis 04/12/2017  . S/P TAVR (transcatheter aortic valve replacement) 04/12/2017  . CKD (chronic kidney disease) stage 4, GFR 15-29 ml/min (HCC) 03/23/2017  . AVM (arteriovenous malformation) of stomach, acquired with hemorrhage   . S/P placement of cardiac pacemaker 02/16/2017  . Anemia 02/13/2017  . Hypothyroidism (acquired) 06/29/2016  . Type 2 diabetes mellitus with stage 4 chronic kidney disease, without long-term current use of insulin ([place]) 09/09/2015  . Benign hypertension with CKD (chronic kidney disease) stage IV ([place]) 08/02/2015    Past Surgical History:  Procedure Laterality Date  . APPENDECTOMY    . CESAREAN SECTION    . CORONARY ATHERECTOMY N/A 02/23/2017   Procedure: CORONARY ATHERECTOMY;  Surgeon: [name], MD;  Location: [facility];  Service: Cardiovascular;  Laterality: N/A;  . ESOPHAGOGASTRODUODENOSCOPY N/A 02/18/2017   Procedure: ESOPHAGOGASTRODUODENOSCOPY (EGD);  Surgeon: Milus Banister, MD;  Location: [facility] ENDOSCOPY;  Service: Endoscopy;  Laterality: N/A;  . EYE SURGERY    . FEMUR FRACTURE SURGERY    . HOT HEMOSTASIS N/A 02/18/2017   Procedure: HOT HEMOSTASIS (ARGON PLASMA COAGULATION/BICAP);  Surgeon: Milus Banister, MD;  Location[facility] ENDOSCOPY;  Service: Endoscopy;  Laterality: N/A;  . IR RADIOLOGY PERIPHERAL GUIDED IV START  03/07/2017  . IR US  GUIDE VASC ACCESS RIGHT  03/07/2017  . RIGHT/LEFT HEART CATH AND CORONARY ANGIOGRAPHY N/A 02/16/2017   Procedure: RIGHT/LEFT HEART CATH AND CORONARY ANGIOGRAPHY;  Surgeon: Wellington Hampshire, MD;  Location: [facility];  Service: Cardiovascular;  Laterality: N/A;  . ROTATOR CUFF REPAIR    . TEE WITHOUT CARDIOVERSION N/A 04/12/2017   Procedure: TRANSESOPHAGEAL ECHOCARDIOGRAM (TEE);  Surgeon: [name], MD;  Location: [facility];  Service: Open Heart Surgery;  Laterality: N/A;  . THYROID SURGERY    . TRANSCATHETER  AORTIC VALVE REPLACEMENT, TRANSFEMORAL N/A 04/12/2017   Procedure: TRANSCATHETER AORTIC VALVE REPLACEMENT, TRANSFEMORAL;  Surgeon: [name], MD;  Location: [facility];  Service: Open Heart Surgery;  Laterality: N/A;     OB History   No obstetric history on file.     Family History  Problem Relation Age of Onset  . CAD Mother        MI in her mid 56's  . Stroke Mother        Light stroke  . Diabetes Father   . Hypertension Sister   . Healthy Sister   . Healthy Sister   . Lung cancer Sister   . Heart disease Neg Hx     Social History   Tobacco Use  . Smoking status: Never Smoker  . Smokeless tobacco: Never Used  Vaping Use  . Vaping Use: Never used  Substance Use Topics  . Alcohol use: No  . Drug use: No    Home Medications Prior to Admission medications   Medication Sig Start Date End Date Taking? Authorizing Provider  amLODipine (NORVASC) 10 MG tablet Take 10 mg by mouth daily.   Yes [provider]  aspirin 81 MG chewable tablet Chew 1 tablet (81 mg total) by mouth daily. 02/25/17  Yes Reino Bellis B, NP  citalopram (CELEXA) 10 MG tablet Take 1 tablet by mouth daily. 01/25/19  Yes [provider]  Coenzyme Q10 (COQ-10) 100 MG CAPS Take 100 mg by mouth daily.   Yes [provider]  GLIPIZIDE XL 5 MG 24 hr tablet Take 5 mg daily by mouth. 04/27/17  Yes [provider]  levothyroxine (SYNTHROID, LEVOTHROID) 100 MCG tablet Take 100 mcg by mouth daily before breakfast.   Yes [provider]  metoprolol tartrate (LOPRESSOR) 25 MG tablet TAKE 1/2 (ONE-HALF) TABLET BY MOUTH TWICE DAILY 06/22/17  Yes Reino Bellis B, NP  nitroGLYCERIN (NITROSTAT) 0.4 MG SL tablet Place 1 tablet (0.4 mg total) under the tongue every 5 (five) minutes as needed for chest pain. 04/21/17  Yes Eileen Stanford, PA-C  pantoprazole (PROTONIX) 40 MG tablet Take 1 tablet (40 mg total) by mouth daily. 03/23/17  Yes Daune Perch, NP  rosuvastatin  (CRESTOR) 40 MG tablet TAKE 1 TABLET BY MOUTH ONCE DAILY AT BEDTIME 07/01/17  Yes [name], MD  sodium bicarbonate 650 MG tablet Take 650 mg by mouth 2 (two) times daily.    Yes [provider]  diclofenac Sodium (VOLTAREN) 1 % GEL Apply 4 g topically 4 (four) times daily. 12/20/19   Deno Etienne, DO    Allergies    Oysters [shellfish allergy] and Sulfa antibiotics  Review of Systems   Review of Systems  Constitutional: Negative for chills and fever.  HENT: Negative for congestion and rhinorrhea.   Eyes: Negative for redness and visual disturbance.  Respiratory: Negative for shortness of breath and wheezing.   Cardiovascular: Negative for chest pain and palpitations.  Gastrointestinal: Negative for abdominal pain, nausea  and vomiting.  Genitourinary: Negative for dysuria and urgency.  Musculoskeletal: Positive for arthralgias. Negative for myalgias.  Skin: Negative for pallor and wound.  Neurological: Positive for headaches. Negative for dizziness.    Physical Exam Updated Vital Signs BP 134/62   Pulse 73   Temp 98.7 F (37.1 C) (Oral)   Resp 18   Ht 5\' 3"  (1.6 m)   Wt 93.9 kg   SpO2 100%   BMI 36.67 kg/m   Physical Exam Vitals and nursing note reviewed.  Constitutional:      General: She is not in acute distress.    Appearance: She is well-developed. She is not diaphoretic.  HENT:     Head: Normocephalic.     Comments: Hematoma to the left temporal region.  Extraocular motor is intact. Eyes:     Pupils: Pupils are equal, round, and reactive to light.  Cardiovascular:     Rate and Rhythm: Normal rate and regular rhythm.     Heart sounds: No murmur heard.  No friction rub. No gallop.   Pulmonary:     Effort: Pulmonary effort is normal.     Breath sounds: No wheezing or rales.  Abdominal:     General: There is no distension.     Palpations: Abdomen is soft.     Tenderness: There is no abdominal tenderness.  Musculoskeletal:        General: No  tenderness.     Cervical back: Normal range of motion and neck supple.  Skin:    General: Skin is warm and dry.     Comments: Abrasion to the bilateral knees.  No obvious bony tenderness full range of motion of the knees without issue.  She has a very small abrasion to the medial aspects of the left hand right at the MCP.  Neurological:     Mental Status: She is alert and oriented to person, place, and time.  Psychiatric:        Behavior: Behavior normal.     ED Results / Procedures / Treatments   Labs (all labs ordered are listed, but only abnormal results are displayed) Labs Reviewed - No data to display  EKG None  Radiology CT Head Wo Contrast  Result Date: 12/20/2019 CLINICAL DATA:  Fall, left facial contusion, pain EXAM: CT HEAD WITHOUT CONTRAST CT CERVICAL SPINE WITHOUT CONTRAST TECHNIQUE: Multidetector CT imaging of the head and cervical spine was performed following the standard protocol without intravenous contrast. Multiplanar CT image reconstructions of the cervical spine were also generated. COMPARISON:  None. FINDINGS: CT HEAD FINDINGS Brain: No evidence of acute infarction, hemorrhage, hydrocephalus, extra-axial collection or mass lesion/mass effect. Moderate low-density changes within the periventricular and subcortical white matter compatible with chronic microvascular ischemic change. Mild diffuse cerebral volume loss. Vascular: Atherosclerotic calcifications involving the large vessels of the skull base. No unexpected hyperdense vessel. Skull: Normal. Negative for fracture or focal lesion. Sinuses/Orbits: Small air-fluid level within the right sphenoid sinus. Mastoid air cells clear. Orbital structures unremarkable. Other: None. CT CERVICAL SPINE FINDINGS Alignment: Facet joints aligned without dislocation. Dens and lateral masses aligned. Straightening of the cervical lordosis without traumatic listhesis. Skull base and vertebrae: No acute fracture. No primary bone lesion or  focal pathologic process. Soft tissues and spinal canal: No prevertebral fluid or swelling. No visible canal hematoma. Disc levels: Intervertebral disc height loss and degenerative endplate spurring most pronounced at C5-6 and C6-7. Facet arthropathy is most pronounced at C2-3 on the right and C3-4 on the left.  No evidence of high-grade foraminal or canal stenosis by CT. Upper chest: Visualized lung apices clear. Other: None. IMPRESSION: 1. No acute intracranial findings. 2. No evidence of acute fracture or traumatic listhesis of the cervical spine. 3. Moderate chronic microvascular ischemic changes and cerebral volume loss. 4. Air-fluid level within the right sphenoid sinus. Correlate for signs and symptoms of sinusitis. 5. Multilevel degenerative disc and facet arthropathy of the cervical spine. Electronically Signed   By: Davina Poke D.O.   On: 12/20/2019 14:49   CT Cervical Spine Wo Contrast  Result Date: 12/20/2019 CLINICAL DATA:  Fall, left facial contusion, pain EXAM: CT HEAD WITHOUT CONTRAST CT CERVICAL SPINE WITHOUT CONTRAST TECHNIQUE: Multidetector CT imaging of the head and cervical spine was performed following the standard protocol without intravenous contrast. Multiplanar CT image reconstructions of the cervical spine were also generated. COMPARISON:  None. FINDINGS: CT HEAD FINDINGS Brain: No evidence of acute infarction, hemorrhage, hydrocephalus, extra-axial collection or mass lesion/mass effect. Moderate low-density changes within the periventricular and subcortical white matter compatible with chronic microvascular ischemic change. Mild diffuse cerebral volume loss. Vascular: Atherosclerotic calcifications involving the large vessels of the skull base. No unexpected hyperdense vessel. Skull: Normal. Negative for fracture or focal lesion. Sinuses/Orbits: Small air-fluid level within the right sphenoid sinus. Mastoid air cells clear. Orbital structures unremarkable. Other: None. CT CERVICAL  SPINE FINDINGS Alignment: Facet joints aligned without dislocation. Dens and lateral masses aligned. Straightening of the cervical lordosis without traumatic listhesis. Skull base and vertebrae: No acute fracture. No primary bone lesion or focal pathologic process. Soft tissues and spinal canal: No prevertebral fluid or swelling. No visible canal hematoma. Disc levels: Intervertebral disc height loss and degenerative endplate spurring most pronounced at C5-6 and C6-7. Facet arthropathy is most pronounced at C2-3 on the right and C3-4 on the left. No evidence of high-grade foraminal or canal stenosis by CT. Upper chest: Visualized lung apices clear. Other: None. IMPRESSION: 1. No acute intracranial findings. 2. No evidence of acute fracture or traumatic listhesis of the cervical spine. 3. Moderate chronic microvascular ischemic changes and cerebral volume loss. 4. Air-fluid level within the right sphenoid sinus. Correlate for signs and symptoms of sinusitis. 5. Multilevel degenerative disc and facet arthropathy of the cervical spine. Electronically Signed   By: Davina Poke D.O.   On: 12/20/2019 14:49   DG Shoulder Left  Result Date: 12/20/2019 CLINICAL DATA:  Left shoulder pain following fall yesterday, initial encounter EXAM: LEFT SHOULDER - 2+ VIEW COMPARISON:  None. FINDINGS: There is no evidence of fracture or dislocation. There is no evidence of arthropathy or other focal bone abnormality. Soft tissues are unremarkable. IMPRESSION: No acute abnormality noted. Electronically Signed   By: Inez Catalina M.D.   On: 12/20/2019 14:35    Procedures Procedures (including critical care time)  Medications Ordered in ED Medications  acetaminophen (TYLENOL) tablet 1,000 mg (1,000 mg Oral Given 12/20/19 1406)    ED Course  I have reviewed the triage vital signs and the nursing notes.  Pertinent labs & imaging results that were available during my care of the patient were reviewed by me and considered in  my medical decision making (see chart for details).    MDM Rules/Calculators/A&P                          86 yo F with a cc of a fall.  This occurred yesterday.  She has a small hematoma to the left side  of her head.  Obtain a CT scan of the head and C-spine.  Plain film of the left shoulder.  Plain film of the shoulder is negative for fracture is viewed by me.  CT of the head and C-spine with no acute finding.  We will discharge the patient home.  PCP follow-up.  3:07 PM:  I have discussed the diagnosis/risks/treatment options with the patient and believe the pt to be eligible for discharge home to follow-up with PCP. We also discussed returning to the ED immediately if new or worsening sx occur. We discussed the sx which are most concerning (e.g., sudden worsening pain, fever, inability to tolerate by mouth) that necessitate immediate return. Medications administered to the patient during their visit and any new prescriptions provided to the patient are listed below.  Medications given during this visit Medications  acetaminophen (TYLENOL) tablet 1,000 mg (1,000 mg Oral Given 12/20/19 1406)     The patient appears reasonably screen and/or stabilized for discharge and I doubt any other medical condition or other [facility] requiring further screening, evaluation, or treatment in the ED at this time prior to discharge.   Final Clinical Impression(s) / ED Diagnoses Final diagnoses:  Fall, initial encounter  Acute pain of left shoulder    Rx / DC Orders ED Discharge Orders         Ordered    diclofenac Sodium (VOLTAREN) 1 % GEL  4 times daily     Discontinue  Reprint     12/20/19 [place], Shalaunda Weatherholtz, DO 12/20/19 1507

## 2019-12-20 NOTE — ED Notes (Signed)
Pt. Reports she was walking up stairs at a house today and she lost her balance and fell to the Left side hitting her Face and head on the L side.  Pt. Has bruising on the L side at her eye.  Pt. Is alert and oriented.

## 2021-04-23 ENCOUNTER — Emergency Department (HOSPITAL_BASED_OUTPATIENT_CLINIC_OR_DEPARTMENT_OTHER)
Admission: EM | Admit: 2021-04-23 | Discharge: 2021-04-23 | Disposition: A | Discharge status: Home / Self Care | Payer: Medicare Other | Attending: Student | Admitting: Student

## 2021-04-23 ENCOUNTER — Emergency Department (HOSPITAL_BASED_OUTPATIENT_CLINIC_OR_DEPARTMENT_OTHER): Payer: Medicare Other

## 2021-04-23 ENCOUNTER — Other Ambulatory Visit: Payer: Self-pay

## 2021-04-23 DIAGNOSIS — E1122 Type 2 diabetes mellitus with diabetic chronic kidney disease: Secondary | ICD-10-CM | POA: Diagnosis not present

## 2021-04-23 DIAGNOSIS — R52 Pain, unspecified: Secondary | ICD-10-CM

## 2021-04-23 DIAGNOSIS — M25511 Pain in right shoulder: Secondary | ICD-10-CM | POA: Diagnosis not present

## 2021-04-23 DIAGNOSIS — Z7982 Long term (current) use of aspirin: Secondary | ICD-10-CM | POA: Insufficient documentation

## 2021-04-23 DIAGNOSIS — I2510 Atherosclerotic heart disease of native coronary artery without angina pectoris: Secondary | ICD-10-CM | POA: Insufficient documentation

## 2021-04-23 DIAGNOSIS — Y92002 Bathroom of unspecified non-institutional (private) residence single-family (private) house as the place of occurrence of the external cause: Secondary | ICD-10-CM | POA: Insufficient documentation

## 2021-04-23 DIAGNOSIS — W01198A Fall on same level from slipping, tripping and stumbling with subsequent striking against other object, initial encounter: Secondary | ICD-10-CM | POA: Diagnosis not present

## 2021-04-23 DIAGNOSIS — Z79899 Other long term (current) drug therapy: Secondary | ICD-10-CM | POA: Insufficient documentation

## 2021-04-23 DIAGNOSIS — R109 Unspecified abdominal pain: Secondary | ICD-10-CM | POA: Diagnosis not present

## 2021-04-23 DIAGNOSIS — M79645 Pain in left finger(s): Secondary | ICD-10-CM | POA: Insufficient documentation

## 2021-04-23 DIAGNOSIS — S0083XA Contusion of other part of head, initial encounter: Secondary | ICD-10-CM | POA: Insufficient documentation

## 2021-04-23 DIAGNOSIS — N184 Chronic kidney disease, stage 4 (severe): Secondary | ICD-10-CM | POA: Insufficient documentation

## 2021-04-23 DIAGNOSIS — M542 Cervicalgia: Secondary | ICD-10-CM | POA: Diagnosis not present

## 2021-04-23 DIAGNOSIS — I129 Hypertensive chronic kidney disease with stage 1 through stage 4 chronic kidney disease, or unspecified chronic kidney disease: Secondary | ICD-10-CM | POA: Diagnosis not present

## 2021-04-23 DIAGNOSIS — E039 Hypothyroidism, unspecified: Secondary | ICD-10-CM | POA: Insufficient documentation

## 2021-04-23 DIAGNOSIS — Z950 Presence of cardiac pacemaker: Secondary | ICD-10-CM | POA: Insufficient documentation

## 2021-04-23 DIAGNOSIS — W19XXXA Unspecified fall, initial encounter: Secondary | ICD-10-CM

## 2021-04-23 DIAGNOSIS — S0990XA Unspecified injury of head, initial encounter: Secondary | ICD-10-CM | POA: Diagnosis present

## 2021-04-23 DIAGNOSIS — S0081XA Abrasion of other part of head, initial encounter: Secondary | ICD-10-CM

## 2021-04-23 NOTE — ED Triage Notes (Addendum)
Pt POV reports mechanical fall yesterday evening and hit her head on bathroom mirror. Denies LOC, Denies taking blood thinners. C/o lower back pain, pain/swelling to right temple and  left hand.

## 2021-04-23 NOTE — ED Provider Notes (Addendum)
[facility] EMERGENCY DEPARTMENT Provider Note   CSN: [id number] Arrival date & time: 04/23/21  4098     History Chief Complaint  Patient presents with   Molly Cobb    Molly Cobb is a 87 y.o. female.  87 y.o female with a PMH of CKD, HTN, TAVR presents to the ED status post mechanical fall yesterday evening.  According to patient she was exiting the bathroom, when she suddenly tripped and fell striking the right side of her face onto a mirror.  She reports she felt like she twisted at her back and fell landing on the left with stretched hand.  On today's visit she is endorsing some right facial pain, left thumb pain, right rib pain, right shoulder pain.  She has taken some Tylenol to help with pain without much improvement in her symptoms.  In addition, she has also tried some heat and ice.  He is currently not on any blood thinners, she did not lose consciousness.  Denies any other injury.  The history is provided by the patient.  Fall Pertinent negatives include no chest pain, no abdominal pain and no shortness of breath.      Past Medical History:  Diagnosis Date   CKD (chronic kidney disease)    a. followed by Dr. Neta Ehlers at [facility]    Coronary artery disease    02/23/17 PCI/DES to mRCA,  normal EF   Depression    Diabetes mellitus ([place])    Gout    Hypertension    Hypothyroidism    Presence of permanent cardiac pacemaker    a. followed by [facility]   S/P TAVR (transcatheter aortic valve replacement) 04/12/2017   23 mm Edwards Sapien 3 transcatheter heart valve placed via percutaneous right transfemoral approach    Severe aortic stenosis    a. diagnosed 01/2017 during admission for chest pain.    Thyroid disease     Patient Active Problem List   Diagnosis Date Noted   Severe aortic stenosis 04/12/2017   S/P TAVR (transcatheter aortic valve replacement) 04/12/2017   CKD (chronic kidney disease) stage 4, GFR 15-29 ml/min ([place]) 03/23/2017   AVM (arteriovenous  malformation) of stomach, acquired with hemorrhage    S/P placement of cardiac pacemaker 02/16/2017   Anemia 02/13/2017   Hypothyroidism (acquired) 06/29/2016   Type 2 diabetes mellitus with stage 4 chronic kidney disease, without long-term current use of insulin ([place]) 09/09/2015   Benign hypertension with CKD (chronic kidney disease) stage IV ([place]) 08/02/2015    Past Surgical History:  Procedure Laterality Date   APPENDECTOMY     CESAREAN SECTION     CORONARY ATHERECTOMY N/A 02/23/2017   Procedure: CORONARY ATHERECTOMY;  Surgeon: [name], MD;  Location: [place] CV [facility];  Service: Cardiovascular;  Laterality: N/A;   ESOPHAGOGASTRODUODENOSCOPY N/A 02/18/2017   Procedure: ESOPHAGOGASTRODUODENOSCOPY (EGD);  Surgeon: Milus Banister, MD;  Location: [facility] ENDOSCOPY;  Service: Endoscopy;  Laterality: N/A;   EYE SURGERY     FEMUR FRACTURE SURGERY     HOT HEMOSTASIS N/A 02/18/2017   Procedure: HOT HEMOSTASIS (ARGON PLASMA COAGULATION/BICAP);  Surgeon: Milus Banister, MD;  Location: [facility] ENDOSCOPY;  Service: Endoscopy;  Laterality: N/A;   IR RADIOLOGY PERIPHERAL GUIDED IV START  03/07/2017   IR US GUIDE VASC ACCESS RIGHT  03/07/2017   RIGHT/LEFT HEART CATH AND CORONARY ANGIOGRAPHY N/A 02/16/2017   Procedure: RIGHT/LEFT HEART CATH AND CORONARY ANGIOGRAPHY;  Surgeon: Wellington Hampshire, MD;  Location: [place] [facility];  Service: Cardiovascular;  Laterality: N/A;   ROTATOR CUFF REPAIR     TEE WITHOUT CARDIOVERSION N/A 04/12/2017   Procedure: TRANSESOPHAGEAL ECHOCARDIOGRAM (TEE);  Surgeon: [name], MD;  Location: [facility];  Service: Open Heart Surgery;  Laterality: N/A;   THYROID SURGERY     TRANSCATHETER AORTIC VALVE REPLACEMENT, TRANSFEMORAL N/A 04/12/2017   Procedure: TRANSCATHETER AORTIC VALVE REPLACEMENT, TRANSFEMORAL;  Surgeon: [name], MD;  Location: [facility];  Service: Open Heart Surgery;  Laterality: N/A;     OB History   No obstetric history on file.     Family History   Problem Relation Age of Onset   CAD Mother        MI in her mid 34's   Stroke Mother        Light stroke   Diabetes Father    Hypertension Sister    Healthy Sister    Healthy Sister    Lung cancer Sister    Heart disease Neg Hx     Social History   Tobacco Use   Smoking status: Never   Smokeless tobacco: Never  Vaping Use   Vaping Use: Never used  Substance Use Topics   Alcohol use: No   Drug use: No    Home Medications Prior to Admission medications   Medication Sig Start Date End Date Taking? Authorizing Provider  amLODipine (NORVASC) 10 MG tablet Take 10 mg by mouth daily.   Yes [provider]  aspirin 81 MG chewable tablet Chew 1 tablet (81 mg total) by mouth daily. 02/25/17  Yes Reino Bellis B, NP  Coenzyme Q10 (COQ-10) 100 MG CAPS Take 100 mg by mouth daily.   Yes [provider]  GLIPIZIDE XL 5 MG 24 hr tablet Take 5 mg daily by mouth. 04/27/17  Yes [provider]  levothyroxine (SYNTHROID, LEVOTHROID) 100 MCG tablet Take 100 mcg by mouth daily before breakfast.   Yes [provider]  metoprolol tartrate (LOPRESSOR) 25 MG tablet TAKE 1/2 (ONE-HALF) TABLET BY MOUTH TWICE DAILY 06/22/17  Yes Reino Bellis B, NP  pantoprazole (PROTONIX) 40 MG tablet Take 1 tablet (40 mg total) by mouth daily. 03/23/17  Yes Daune Perch, NP  rosuvastatin (CRESTOR) 40 MG tablet TAKE 1 TABLET BY MOUTH ONCE DAILY AT BEDTIME 07/01/17  Yes [name], MD  sodium bicarbonate 650 MG tablet Take 650 mg by mouth 2 (two) times daily.    Yes [provider]  citalopram (CELEXA) 10 MG tablet Take 1 tablet by mouth daily. 01/25/19   [provider]  diclofenac Sodium (VOLTAREN) 1 % GEL Apply 4 g topically 4 (four) times daily. 12/20/19   Deno Etienne, DO  nitroGLYCERIN (NITROSTAT) 0.4 MG SL tablet Place 1 tablet (0.4 mg total) under the tongue every 5 (five) minutes as needed for chest pain. 04/21/17   Eileen Stanford, PA-C     Allergies    Oysters [shellfish allergy] and Sulfa antibiotics  Review of Systems   Review of Systems  Constitutional:  Negative for chills and fever.  Respiratory:  Negative for shortness of breath.   Cardiovascular:  Negative for chest pain.  Gastrointestinal:  Negative for abdominal pain, diarrhea and vomiting.  Musculoskeletal:  Positive for back pain, myalgias and neck pain.  All other systems reviewed and are negative.  Physical Exam Updated Vital Signs BP (!) 174/79   Pulse 69   Temp 98.3 F (36.8 C) (Oral)   Resp 16   Ht 5\' 3"  (1.6 m)   Wt 86.2 kg  SpO2 100%   BMI 33.66 kg/m   Physical Exam Vitals and nursing note reviewed.  Constitutional:      Appearance: Normal appearance.  HENT:     Head: Normocephalic.     Comments: Right-sided facial bruising noted without any laceration present.    Mouth/Throat:     Mouth: Mucous membranes are moist.  Eyes:     Extraocular Movements: Extraocular movements intact.     Pupils: Pupils are equal, round, and reactive to light.     Comments: Pupils are equal and reactive.  Cardiovascular:     Rate and Rhythm: Normal rate.  Pulmonary:     Effort: Pulmonary effort is normal.     Breath sounds: Normal breath sounds. No decreased air movement.    Abdominal:     General: Abdomen is flat.     Palpations: Abdomen is soft.     Tenderness: There is no abdominal tenderness.  Musculoskeletal:        General: Tenderness present.     Cervical back: Normal range of motion and neck supple.     Comments: Pain with palpation of the left base, limited range of motion due to pain.  Pulses present, capillary refill is intact.  Skin:    General: Skin is warm and dry.  Neurological:     Mental Status: She is alert and oriented to person, place, and time.    ED Results / Procedures / Treatments   Labs (all labs ordered are listed, but only abnormal results are displayed) Labs Reviewed - No data to  display  EKG None  Radiology DG Ribs Bilateral W/Chest  Result Date: 04/23/2021 CLINICAL DATA:  Fall with bilateral rib pain EXAM: BILATERAL RIBS AND CHEST - 4+ VIEW COMPARISON:  08/12/2020 FINDINGS: No displaced fracture or other bone lesions are seen involving the ribs. There is no evidence of pneumothorax or pleural effusion. Both lungs are clear. Heart size and mediastinal contours are within normal limits. Prior TAVR. Aortic atherosclerosis. Left-sided implanted cardiac device remains in place. IMPRESSION: 1. No displaced rib fracture. 2. No acute cardiopulmonary findings. 3. Aortic atherosclerosis. Electronically Signed   By: Davina Poke D.O.   On: 04/23/2021 13:57   DG Shoulder Right  Result Date: 04/23/2021 CLINICAL DATA:  Trauma, fall EXAM: RIGHT SHOULDER - 2+ VIEW COMPARISON:  Right humerus done on 07/29/2017 FINDINGS: No recent fracture or dislocation is seen. There is deformity in the shaft of right humerus without definite radiolucent fracture line suggesting old malunited fracture. Osteopenia is seen in bony structures. Degenerative changes are noted in right AC joint. IMPRESSION: No recent fracture or dislocation is seen in right shoulder. Possible old malunited fracture in the shaft of right humerus. Osteopenia. Reading location: [place], [place]. Electronically Signed   By: Elmer Picker M.D.   On: 04/23/2021 13:53   CT Head Wo Contrast  Result Date: 04/23/2021 CLINICAL DATA:  Facial trauma EXAM: CT HEAD WITHOUT CONTRAST TECHNIQUE: Contiguous axial images were obtained from the base of the skull through the vertex without intravenous contrast. COMPARISON:  December 20, 2019. FINDINGS: Brain: No evidence of acute infarction, hemorrhage, hydrocephalus, extra-axial collection or mass lesion/mass effect. Similar patchy white matter hypoattenuation, nonspecific but of chronic microvascular ischemic disease. Similar mild for age atrophy ex vacuo ventricular dilation. Similar  small remote infarct in the right cerebellum. Vascular: Calcific intracranial atherosclerosis. No hyperdense vessel identified. Skull: No acute fracture. Sinuses/Orbits: Mild right sphenoid sinus mucosal thickening. Otherwise, clear visualized sinuses. No acute orbital findings. Other:  No mastoid effusions. IMPRESSION: 1. No evidence of acute intracranial abnormality. 2. Similar chronic microvascular ischemic disease, atrophy, and small remote right cerebellar infarct. Electronically Signed   By: Margaretha Sheffield M.D.   On: 04/23/2021 10:55   DG Finger Thumb Left  Result Date: 04/23/2021 CLINICAL DATA:  Pain EXAM: LEFT THUMB 2+V COMPARISON:  None. FINDINGS: There is no evidence of fracture or dislocation. Moderate degenerative changes of the first [facility] joint. Soft tissues are unremarkable. IMPRESSION: No acute osseous abnormality. Electronically Signed   By: Yetta Glassman M.D.   On: 04/23/2021 13:56    Procedures Procedures   Medications Ordered in ED Medications - No data to display  ED Course  I have reviewed the triage vital signs and the nursing notes.  Pertinent labs & imaging results that were available during my care of the patient were reviewed by me and considered in my medical decision making (see chart for details).    MDM Rules/Calculators/A&P   Presents to the ED status post mechanical fall yesterday.  According to patient she sustained a fall striking the right side of her face against the mirror, landing on her left outstretched hand.  Had no improvement with over-the-counter medication.  Endorses pain along her neck, face, right shoulder, left thumb, right flank.  No visible bruising noted.  Left hand exam remarkable for limited range of motion due to pain and swelling of the left thumb.  No bruising or lacerations noted throughout her whole body.  Denies any dizziness chest pain or lightheadedness prior to fall.  CT head showed: 1. No evidence of acute intracranial  abnormality.  2. Similar chronic microvascular ischemic disease, atrophy, and  small remote right cerebellar infarct.   Additional x-rays have been placed after evaluation with patient.  Xray of the ribs 1. No displaced rib fracture.  2. No acute cardiopulmonary findings.  3. Aortic atherosclerosis.      Xray right shoulder:  No recent fracture or dislocation is seen in right shoulder.  Possible old malunited fracture in the shaft of right humerus.  Osteopenia.     Reading location: [place], [place].   Xray of the left thumb: Within normal limits.  Continues to report pain along the area, I did suggest a Velcro splint.  She is agreeable of using this at this time.  Results were discussed at length with patient.  We did discuss symptomatic treatment with Tylenol, RICE therapy.  Follow-up with PCP as needed.  Patient understands and agrees to management, discharged from the ED with stable vital signs.  BP (!) 174/79   Pulse 69   Temp 98.3 F (36.8 C) (Oral)   Resp 16   Ht 5\' 3"  (1.6 m)   Wt 86.2 kg   SpO2 100%   BMI 33.66 kg/m    Portions of this note were generated with [employer]. Dictation errors may occur despite best attempts at proofreading.  Final Clinical Impression(s) / ED Diagnoses Final diagnoses:  Fall, initial encounter  Abrasion of face, initial encounter    Rx / DC Orders ED Discharge Orders     None        Janeece Fitting, PA-C 04/23/21 [place], [place] Subramaniam, PA-C 04/23/21 1442    Teressa Lower, MD 04/23/21 [id number]

## 2021-04-23 NOTE — Discharge Instructions (Addendum)
We discussed the results of your x-rays at length.  They were all found within normal limits.  The CT of your head did not show any acute findings.  You may continue treating your pain with Tylenol, heat, ice.  Follow-up with your primary care physician as needed.

## 2021-09-21 ENCOUNTER — Inpatient Hospital Stay (HOSPITAL_BASED_OUTPATIENT_CLINIC_OR_DEPARTMENT_OTHER)
Admission: EM | Admit: 2021-09-21 | Discharge: 2021-09-24 | DRG: 641 | Disposition: A | Discharge status: Skilled Nursing Facility | Payer: Medicare Other | Attending: Internal Medicine | Admitting: Internal Medicine

## 2021-09-21 ENCOUNTER — Encounter (HOSPITAL_BASED_OUTPATIENT_CLINIC_OR_DEPARTMENT_OTHER): Payer: Self-pay | Admitting: Urology

## 2021-09-21 ENCOUNTER — Other Ambulatory Visit: Payer: Self-pay

## 2021-09-21 ENCOUNTER — Emergency Department (HOSPITAL_BASED_OUTPATIENT_CLINIC_OR_DEPARTMENT_OTHER): Payer: Medicare Other

## 2021-09-21 DIAGNOSIS — F32A Depression, unspecified: Secondary | ICD-10-CM | POA: Diagnosis present

## 2021-09-21 DIAGNOSIS — E039 Hypothyroidism, unspecified: Secondary | ICD-10-CM | POA: Diagnosis present

## 2021-09-21 DIAGNOSIS — I350 Nonrheumatic aortic (valve) stenosis: Secondary | ICD-10-CM | POA: Diagnosis present

## 2021-09-21 DIAGNOSIS — Z8249 Family history of ischemic heart disease and other diseases of the circulatory system: Secondary | ICD-10-CM

## 2021-09-21 DIAGNOSIS — R296 Repeated falls: Secondary | ICD-10-CM | POA: Diagnosis present

## 2021-09-21 DIAGNOSIS — E871 Hypo-osmolality and hyponatremia: Principal | ICD-10-CM | POA: Diagnosis present

## 2021-09-21 DIAGNOSIS — Z79899 Other long term (current) drug therapy: Secondary | ICD-10-CM

## 2021-09-21 DIAGNOSIS — Z801 Family history of malignant neoplasm of trachea, bronchus and lung: Secondary | ICD-10-CM

## 2021-09-21 DIAGNOSIS — Z7982 Long term (current) use of aspirin: Secondary | ICD-10-CM

## 2021-09-21 DIAGNOSIS — I129 Hypertensive chronic kidney disease with stage 1 through stage 4 chronic kidney disease, or unspecified chronic kidney disease: Secondary | ICD-10-CM | POA: Diagnosis present

## 2021-09-21 DIAGNOSIS — Z7984 Long term (current) use of oral hypoglycemic drugs: Secondary | ICD-10-CM

## 2021-09-21 DIAGNOSIS — R2681 Unsteadiness on feet: Secondary | ICD-10-CM | POA: Diagnosis present

## 2021-09-21 DIAGNOSIS — I2510 Atherosclerotic heart disease of native coronary artery without angina pectoris: Secondary | ICD-10-CM | POA: Diagnosis present

## 2021-09-21 DIAGNOSIS — Z833 Family history of diabetes mellitus: Secondary | ICD-10-CM

## 2021-09-21 DIAGNOSIS — Z91013 Allergy to seafood: Secondary | ICD-10-CM

## 2021-09-21 DIAGNOSIS — Z823 Family history of stroke: Secondary | ICD-10-CM

## 2021-09-21 DIAGNOSIS — R350 Frequency of micturition: Secondary | ICD-10-CM | POA: Diagnosis present

## 2021-09-21 DIAGNOSIS — Z950 Presence of cardiac pacemaker: Secondary | ICD-10-CM

## 2021-09-21 DIAGNOSIS — M109 Gout, unspecified: Secondary | ICD-10-CM | POA: Diagnosis present

## 2021-09-21 DIAGNOSIS — E1122 Type 2 diabetes mellitus with diabetic chronic kidney disease: Secondary | ICD-10-CM | POA: Diagnosis present

## 2021-09-21 DIAGNOSIS — D631 Anemia in chronic kidney disease: Secondary | ICD-10-CM | POA: Diagnosis present

## 2021-09-21 DIAGNOSIS — N189 Chronic kidney disease, unspecified: Secondary | ICD-10-CM | POA: Diagnosis present

## 2021-09-21 DIAGNOSIS — W19XXXA Unspecified fall, initial encounter: Secondary | ICD-10-CM | POA: Diagnosis present

## 2021-09-21 DIAGNOSIS — N184 Chronic kidney disease, stage 4 (severe): Secondary | ICD-10-CM | POA: Diagnosis present

## 2021-09-21 DIAGNOSIS — Z953 Presence of xenogenic heart valve: Secondary | ICD-10-CM

## 2021-09-21 DIAGNOSIS — Z881 Allergy status to other antibiotic agents status: Secondary | ICD-10-CM

## 2021-09-21 DIAGNOSIS — Z79890 Hormone replacement therapy (postmenopausal): Secondary | ICD-10-CM

## 2021-09-21 DIAGNOSIS — Z888 Allergy status to other drugs, medicaments and biological substances status: Secondary | ICD-10-CM

## 2021-09-21 LAB — URINALYSIS, ROUTINE W REFLEX MICROSCOPIC
Bilirubin Urine: NEGATIVE
Glucose, UA: NEGATIVE mg/dL
Ketones, ur: NEGATIVE mg/dL
Nitrite: NEGATIVE
Protein, ur: 30 mg/dL — AB
Specific Gravity, Urine: 1.015 (ref 1.005–1.030)
pH: 6 (ref 5.0–8.0)

## 2021-09-21 LAB — CBC WITH DIFFERENTIAL/PLATELET
Abs Immature Granulocytes: 0.07 10*3/uL (ref 0.00–0.07)
Basophils Absolute: 0 10*3/uL (ref 0.0–0.1)
Basophils Relative: 0 %
Eosinophils Absolute: 0 10*3/uL (ref 0.0–0.5)
Eosinophils Relative: 0 %
HCT: 34.8 % — ABNORMAL LOW (ref 36.0–46.0)
Hemoglobin: 11.9 g/dL — ABNORMAL LOW (ref 12.0–15.0)
Immature Granulocytes: 1 %
Lymphocytes Relative: 14 %
Lymphs Abs: 1.6 10*3/uL (ref 0.7–4.0)
MCH: 28.4 pg (ref 26.0–34.0)
MCHC: 34.2 g/dL (ref 30.0–36.0)
MCV: 83.1 fL (ref 80.0–100.0)
Monocytes Absolute: 1.1 10*3/uL — ABNORMAL HIGH (ref 0.1–1.0)
Monocytes Relative: 10 %
Neutro Abs: 8.6 10*3/uL — ABNORMAL HIGH (ref 1.7–7.7)
Neutrophils Relative %: 75 %
Platelets: 172 10*3/uL (ref 150–400)
RBC: 4.19 MIL/uL (ref 3.87–5.11)
RDW: 14.1 % (ref 11.5–15.5)
WBC: 11.5 10*3/uL — ABNORMAL HIGH (ref 4.0–10.5)
nRBC: 0 % (ref 0.0–0.2)

## 2021-09-21 LAB — LIPASE, BLOOD: Lipase: 43 U/L (ref 11–51)

## 2021-09-21 LAB — COMPREHENSIVE METABOLIC PANEL
ALT: 23 U/L (ref 0–44)
AST: 35 U/L (ref 15–41)
Albumin: 4 g/dL (ref 3.5–5.0)
Alkaline Phosphatase: 75 U/L (ref 38–126)
Anion gap: 11 (ref 5–15)
BUN: 42 mg/dL — ABNORMAL HIGH (ref 8–23)
CO2: 21 mmol/L — ABNORMAL LOW (ref 22–32)
Calcium: 9.2 mg/dL (ref 8.9–10.3)
Chloride: 93 mmol/L — ABNORMAL LOW (ref 98–111)
Creatinine, Ser: 2.39 mg/dL — ABNORMAL HIGH (ref 0.44–1.00)
GFR, Estimated: 19 mL/min — ABNORMAL LOW (ref >60)
Glucose, Bld: 149 mg/dL — ABNORMAL HIGH (ref 70–99)
Potassium: 4 mmol/L (ref 3.5–5.1)
Sodium: 125 mmol/L — ABNORMAL LOW (ref 135–145)
Total Bilirubin: 0.9 mg/dL (ref 0.3–1.2)
Total Protein: 7.7 g/dL (ref 6.5–8.1)

## 2021-09-21 LAB — URINALYSIS, MICROSCOPIC (REFLEX)

## 2021-09-21 LAB — OSMOLALITY, URINE: Osmolality, Ur: 276 mOsm/kg — ABNORMAL LOW (ref 300–900)

## 2021-09-21 LAB — SODIUM, URINE, RANDOM: Sodium, Ur: 70 mmol/L

## 2021-09-21 LAB — CBG MONITORING, ED: Glucose-Capillary: 171 mg/dL — ABNORMAL HIGH (ref 70–99)

## 2021-09-21 MED ORDER — SODIUM CHLORIDE 0.9 % IV SOLN: Freq: Once | INTRAVENOUS | Status: AC

## 2021-09-21 MED ORDER — SODIUM CHLORIDE 0.9 % IV BOLUS
500.0000 mL | Freq: Once | INTRAVENOUS | Status: AC
  Administered 2021-09-21: 500 mL via INTRAVENOUS

## 2021-09-21 NOTE — ED Triage Notes (Signed)
Pt states feels like she needs to drink a lot of water, states frequent urination since yesterday  ?States RUQ pain with palpation only  ?Denies N/V, denies fever  ? ?

## 2021-09-21 NOTE — ED Notes (Signed)
Patient transported to CT 

## 2021-09-21 NOTE — ED Notes (Signed)
ED Provider at bedside. 

## 2021-09-21 NOTE — ED Notes (Signed)
ED Provider at bedside to update pt/family on plan of care.  ?

## 2021-09-21 NOTE — ED Provider Notes (Signed)
?[facility] EMERGENCY DEPARTMENT ?Provider Note ? ? ?CSN: [id number] ?Arrival date & time: 09/21/21  1819 ? ?  ? ?History ? ?Chief Complaint  ?Patient presents with  ? Urinary Frequency  ? Abdominal Pain  ? ? ?Molly Cobb is a 88 y.o. female. ? ?HPI ?88 year old female with a history of CKD, CAD, diabetes, hypertension, aortic stenosis, presents with right-sided flank pain and increased urination.  Both of the symptoms have been for a week.  She had the right-sided pain before though its been a while.  The pain seems to be primarily with certain positions such as sitting down.  At times it can get severe.  She is taken some Tylenol for it.  No dysuria but she is urinating more often than typical.  Her kidney doctor has told her to drink plenty of fluids but she has not changed her fluids recently and still is urinating more.  No fevers, vomiting, abdominal pain.  No chest pain, pleuritic pain, or shortness of breath. ? ?Home Medications ?Prior to Admission medications   ?Medication Sig Start Date End Date Taking? Authorizing Provider  ?amLODipine (NORVASC) 10 MG tablet Take 10 mg by mouth daily.    [provider]  ?aspirin 81 MG chewable tablet Chew 1 tablet (81 mg total) by mouth daily. 02/25/17   Cheryln Manly, NP  ?citalopram (CELEXA) 10 MG tablet Take 1 tablet by mouth daily. 01/25/19   [provider]  ?Coenzyme Q10 (COQ-10) 100 MG CAPS Take 100 mg by mouth daily.    [provider]  ?diclofenac Sodium (VOLTAREN) 1 % GEL Apply 4 g topically 4 (four) times daily. 12/20/19   Deno Etienne, DO  ?GLIPIZIDE XL 5 MG 24 hr tablet Take 5 mg daily by mouth. 04/27/17   [provider]  ?levothyroxine (SYNTHROID, LEVOTHROID) 100 MCG tablet Take 100 mcg by mouth daily before breakfast.    [provider]  ?metoprolol tartrate (LOPRESSOR) 25 MG tablet TAKE 1/2 (ONE-HALF) TABLET BY MOUTH TWICE DAILY 06/22/17   Reino Bellis B, NP  ?nitroGLYCERIN (NITROSTAT) 0.4 MG SL  tablet Place 1 tablet (0.4 mg total) under the tongue every 5 (five) minutes as needed for chest pain. 04/21/17   Eileen Stanford, PA-C  ?pantoprazole (PROTONIX) 40 MG tablet Take 1 tablet (40 mg total) by mouth daily. 03/23/17   Daune Perch, NP  ?rosuvastatin (CRESTOR) 40 MG tablet TAKE 1 TABLET BY MOUTH ONCE DAILY AT BEDTIME 07/01/17   [name], MD  ?sodium bicarbonate 650 MG tablet Take 650 mg by mouth 2 (two) times daily.     [provider]  ?   ? ?Allergies    ?Oysters [shellfish allergy] and Sulfa antibiotics   ? ?Review of Systems   ?Review of Systems  ?Constitutional:  Negative for fever.  ?Respiratory:  Negative for shortness of breath.   ?Cardiovascular:  Negative for chest pain.  ?Gastrointestinal:  Negative for abdominal pain and vomiting.  ?Genitourinary:  Positive for flank pain and frequency. Negative for dysuria.  ? ?Physical Exam ?Updated Vital Signs ?BP (!) 163/73   Pulse 75   Temp 98.5 ?F (36.9 ?C) (Oral)   Resp 16   Ht 5\' 3"  (1.6 m)   Wt 86.2 kg   SpO2 99%   BMI 33.66 kg/m?  ?Physical Exam ?Vitals and nursing note reviewed.  ?Constitutional:   ?   Appearance: She is well-developed.  ?HENT:  ?   Head: Normocephalic and atraumatic.  ?Cardiovascular:  ?  Rate and Rhythm: Normal rate and regular rhythm.  ?   Heart sounds: Normal heart sounds.  ?Pulmonary:  ?   Effort: Pulmonary effort is normal.  ?   Breath sounds: Normal breath sounds.  ?Abdominal:  ?   Palpations: Abdomen is soft.  ?   Tenderness: There is no abdominal tenderness. There is no right CVA tenderness or left CVA tenderness.  ?   Comments: No rash seen to the abdomen or back.  In the mid axillary line under her ribs there is some point tenderness to even very light touch.  ?Skin: ?   General: Skin is warm and dry.  ?Neurological:  ?   Mental Status: She is alert.  ? ? ?ED Results / Procedures / Treatments   ?Labs ?(all labs ordered are listed, but only abnormal results are displayed) ?Labs Reviewed   ?URINALYSIS, ROUTINE W REFLEX MICROSCOPIC - Abnormal; Notable for the following components:  ?    Result Value  ? Hgb urine dipstick TRACE (*)   ? Protein, ur 30 (*)   ? Leukocytes,Ua SMALL (*)   ? All other components within normal limits  ?COMPREHENSIVE METABOLIC PANEL - Abnormal; Notable for the following components:  ? Sodium 125 (*)   ? Chloride 93 (*)   ? CO2 21 (*)   ? Glucose, Bld 149 (*)   ? BUN 42 (*)   ? Creatinine, Ser 2.39 (*)   ? GFR, Estimated 19 (*)   ? All other components within normal limits  ?CBC WITH DIFFERENTIAL/PLATELET - Abnormal; Notable for the following components:  ? WBC 11.5 (*)   ? Hemoglobin 11.9 (*)   ? HCT 34.8 (*)   ? Neutro Abs 8.6 (*)   ? Monocytes Absolute 1.1 (*)   ? All other components within normal limits  ?URINALYSIS, MICROSCOPIC (REFLEX) - Abnormal; Notable for the following components:  ? Bacteria, UA RARE (*)   ? All other components within normal limits  ?CBG MONITORING, ED - Abnormal; Notable for the following components:  ? Glucose-Capillary 171 (*)   ? All other components within normal limits  ?LIPASE, BLOOD  ?SODIUM, URINE, RANDOM  ?OSMOLALITY, URINE  ? ? ?EKG ?None ? ?Radiology ?CT Renal Stone Study ? ?Result Date: 09/21/2021 ?CLINICAL DATA:  Flank pain EXAM: CT ABDOMEN AND PELVIS WITHOUT CONTRAST TECHNIQUE: Multidetector CT imaging of the abdomen and pelvis was performed following the standard protocol without IV contrast. RADIATION DOSE REDUCTION: This exam was performed according to the departmental dose-optimization program which includes automated exposure control, adjustment of the mA and/or kV according to patient size and/or use of iterative reconstruction technique. COMPARISON:  03/07/2017 FINDINGS: Lower chest: Coronary artery calcifications are seen. Pacer leads are noted in place. Hepatobiliary: No focal abnormality is seen in the liver. Gallbladder is not distended. There is tiny calcific density in the dependent portion of gallbladder, possibly  gallstone. There is no wall thickening in the gallbladder. Pancreas: There is pancreatic atrophy. No focal abnormality is seen. Spleen: Unremarkable. Adrenals/Urinary Tract: There is mild hypertrophy of both adrenals. There is 13 mm nodule in the right adrenal. There is 20 x 14 mm nodule in the left adrenal. These findings have not changed significantly. There is no hydronephrosis. There are scattered calcifications in the renal artery branches. Possibility of 1 of these calcifications being renal stone is not excluded. Ureters are not dilated. Urinary bladder is not distended. Stomach/Bowel: Small hiatal hernia is seen. Small bowel loops are not dilated. Appendix is not seen.  There is no focal pericecal inflammation. There is no significant wall thickening in the colon. There is no pericolic stranding. Vascular/Lymphatic: Arterial calcifications are seen in the abdominal aorta and its major branches. There are calcified nodules in the mesentery, possibly calcified lymph nodes. Reproductive: Uterus is not seen.  There are no adnexal masses. Other: There is no ascites or pneumoperitoneum. Musculoskeletal: There is previous internal fixation in the right femur. Degenerative changes are noted in the lumbar spine, particularly severe at L2-L3 level. IMPRESSION: There is no evidence of intestinal obstruction or pneumoperitoneum. There is no hydronephrosis. Small hiatal hernia. Coronary artery calcifications are seen. Possible tiny gallbladder stone. Nodules in the adrenals have not changed significantly, possibly adenomas. There are multiple small calcifications in both kidneys most likely calcifications in the renal artery branches. Possibility of 1 or more of these calcifications being nonobstructing renal stone is not excluded. Other findings as described in the body of the report. Electronically Signed   By: Elmer Picker M.D.   On: 09/21/2021 19:27   ? ?Procedures ?Procedures  ? ? ?Medications Ordered in  ED ?Medications  ?sodium chloride 0.9 % bolus 500 mL (0 mLs Intravenous Stopped 09/21/21 2023)  ?0.9 %  sodium chloride infusion ( Intravenous New Bag/Given 09/21/21 2309)  ? ? ?ED Course/ Medical Decision Making/ A&P ?  ?

## 2021-09-22 ENCOUNTER — Emergency Department (HOSPITAL_BASED_OUTPATIENT_CLINIC_OR_DEPARTMENT_OTHER): Payer: Medicare Other

## 2021-09-22 DIAGNOSIS — R350 Frequency of micturition: Secondary | ICD-10-CM | POA: Diagnosis not present

## 2021-09-22 DIAGNOSIS — I129 Hypertensive chronic kidney disease with stage 1 through stage 4 chronic kidney disease, or unspecified chronic kidney disease: Secondary | ICD-10-CM | POA: Diagnosis not present

## 2021-09-22 DIAGNOSIS — Z7984 Long term (current) use of oral hypoglycemic drugs: Secondary | ICD-10-CM | POA: Diagnosis not present

## 2021-09-22 DIAGNOSIS — I350 Nonrheumatic aortic (valve) stenosis: Secondary | ICD-10-CM | POA: Diagnosis not present

## 2021-09-22 DIAGNOSIS — N184 Chronic kidney disease, stage 4 (severe): Secondary | ICD-10-CM | POA: Diagnosis not present

## 2021-09-22 DIAGNOSIS — F32A Depression, unspecified: Secondary | ICD-10-CM | POA: Diagnosis not present

## 2021-09-22 DIAGNOSIS — Z833 Family history of diabetes mellitus: Secondary | ICD-10-CM | POA: Diagnosis not present

## 2021-09-22 DIAGNOSIS — I2510 Atherosclerotic heart disease of native coronary artery without angina pectoris: Secondary | ICD-10-CM | POA: Diagnosis not present

## 2021-09-22 DIAGNOSIS — Z953 Presence of xenogenic heart valve: Secondary | ICD-10-CM | POA: Diagnosis not present

## 2021-09-22 DIAGNOSIS — Z881 Allergy status to other antibiotic agents status: Secondary | ICD-10-CM | POA: Diagnosis not present

## 2021-09-22 DIAGNOSIS — Z888 Allergy status to other drugs, medicaments and biological substances status: Secondary | ICD-10-CM | POA: Diagnosis not present

## 2021-09-22 DIAGNOSIS — Z8249 Family history of ischemic heart disease and other diseases of the circulatory system: Secondary | ICD-10-CM | POA: Diagnosis not present

## 2021-09-22 DIAGNOSIS — Z801 Family history of malignant neoplasm of trachea, bronchus and lung: Secondary | ICD-10-CM | POA: Diagnosis not present

## 2021-09-22 DIAGNOSIS — E871 Hypo-osmolality and hyponatremia: Secondary | ICD-10-CM

## 2021-09-22 DIAGNOSIS — Z7982 Long term (current) use of aspirin: Secondary | ICD-10-CM | POA: Diagnosis not present

## 2021-09-22 DIAGNOSIS — E039 Hypothyroidism, unspecified: Secondary | ICD-10-CM | POA: Diagnosis not present

## 2021-09-22 DIAGNOSIS — Z91013 Allergy to seafood: Secondary | ICD-10-CM | POA: Diagnosis not present

## 2021-09-22 DIAGNOSIS — Z950 Presence of cardiac pacemaker: Secondary | ICD-10-CM | POA: Diagnosis not present

## 2021-09-22 DIAGNOSIS — W19XXXA Unspecified fall, initial encounter: Secondary | ICD-10-CM | POA: Diagnosis not present

## 2021-09-22 DIAGNOSIS — D631 Anemia in chronic kidney disease: Secondary | ICD-10-CM | POA: Diagnosis not present

## 2021-09-22 DIAGNOSIS — E1122 Type 2 diabetes mellitus with diabetic chronic kidney disease: Secondary | ICD-10-CM | POA: Diagnosis not present

## 2021-09-22 DIAGNOSIS — Z79890 Hormone replacement therapy (postmenopausal): Secondary | ICD-10-CM | POA: Diagnosis not present

## 2021-09-22 DIAGNOSIS — R296 Repeated falls: Secondary | ICD-10-CM | POA: Diagnosis not present

## 2021-09-22 DIAGNOSIS — Z823 Family history of stroke: Secondary | ICD-10-CM | POA: Diagnosis not present

## 2021-09-22 DIAGNOSIS — M109 Gout, unspecified: Secondary | ICD-10-CM | POA: Diagnosis not present

## 2021-09-22 LAB — URINALYSIS, ROUTINE W REFLEX MICROSCOPIC
Bacteria, UA: NONE SEEN
Bilirubin Urine: NEGATIVE
Glucose, UA: 150 mg/dL — AB
Hgb urine dipstick: NEGATIVE
Ketones, ur: NEGATIVE mg/dL
Leukocytes,Ua: NEGATIVE
Nitrite: NEGATIVE
Protein, ur: 30 mg/dL — AB
Specific Gravity, Urine: 1.006 (ref 1.005–1.030)
pH: 6 (ref 5.0–8.0)

## 2021-09-22 LAB — BASIC METABOLIC PANEL
Anion gap: 8 (ref 5–15)
Anion gap: 8 (ref 5–15)
BUN: 43 mg/dL — ABNORMAL HIGH (ref 8–23)
BUN: 41 mg/dL — ABNORMAL HIGH (ref 8–23)
CO2: 23 mmol/L (ref 22–32)
CO2: 22 mmol/L (ref 22–32)
Calcium: 8.7 mg/dL — ABNORMAL LOW (ref 8.9–10.3)
Calcium: 8.7 mg/dL — ABNORMAL LOW (ref 8.9–10.3)
Chloride: 97 mmol/L — ABNORMAL LOW (ref 98–111)
Chloride: 99 mmol/L (ref 98–111)
Creatinine, Ser: 2.36 mg/dL — ABNORMAL HIGH (ref 0.44–1.00)
Creatinine, Ser: 2.18 mg/dL — ABNORMAL HIGH (ref 0.44–1.00)
GFR, Estimated: 19 mL/min — ABNORMAL LOW (ref >60)
GFR, Estimated: 21 mL/min — ABNORMAL LOW (ref >60)
Glucose, Bld: 153 mg/dL — ABNORMAL HIGH (ref 70–99)
Glucose, Bld: 150 mg/dL — ABNORMAL HIGH (ref 70–99)
Potassium: 4.1 mmol/L (ref 3.5–5.1)
Potassium: 4 mmol/L (ref 3.5–5.1)
Sodium: 128 mmol/L — ABNORMAL LOW (ref 135–145)
Sodium: 129 mmol/L — ABNORMAL LOW (ref 135–145)

## 2021-09-22 LAB — CORTISOL: Cortisol, Plasma: 12.1 ug/dL

## 2021-09-22 LAB — GLUCOSE, CAPILLARY
Glucose-Capillary: 180 mg/dL — ABNORMAL HIGH (ref 70–99)
Glucose-Capillary: 145 mg/dL — ABNORMAL HIGH (ref 70–99)

## 2021-09-22 LAB — TSH: TSH: 1.664 u[IU]/mL (ref 0.350–4.500)

## 2021-09-22 LAB — CBG MONITORING, ED: Glucose-Capillary: 219 mg/dL — ABNORMAL HIGH (ref 70–99)

## 2021-09-22 MED ORDER — METOPROLOL TARTRATE 25 MG PO TABS
25.0000 mg | ORAL_TABLET | Freq: Two times a day (BID) | ORAL | Status: DC
  Administered 2021-09-22 – 2021-09-24 (×5): 25 mg via ORAL
  Filled 2021-09-22 (×5): qty 1

## 2021-09-22 MED ORDER — INFLUENZA VAC A&B SA ADJ QUAD 0.5 ML IM PRSY
0.5000 mL | PREFILLED_SYRINGE | INTRAMUSCULAR | Status: DC | PRN
  Filled 2021-09-22: qty 0.5

## 2021-09-22 MED ORDER — GLIPIZIDE ER 5 MG PO TB24: 5.0000 mg | ORAL_TABLET | Freq: Every day | ORAL | Status: DC

## 2021-09-22 MED ORDER — ESCITALOPRAM OXALATE 10 MG PO TABS: 10.0000 mg | ORAL_TABLET | Freq: Every morning | ORAL | Status: DC

## 2021-09-22 MED ORDER — PANTOPRAZOLE SODIUM 40 MG PO TBEC
40.0000 mg | DELAYED_RELEASE_TABLET | Freq: Every day | ORAL | Status: DC
  Administered 2021-09-22 – 2021-09-24 (×3): 40 mg via ORAL
  Filled 2021-09-22 (×3): qty 1

## 2021-09-22 MED ORDER — ACETAMINOPHEN 650 MG RE SUPP: 650.0000 mg | Freq: Four times a day (QID) | RECTAL | Status: DC | PRN

## 2021-09-22 MED ORDER — ONDANSETRON HCL 4 MG/2ML IJ SOLN: 4.0000 mg | Freq: Four times a day (QID) | INTRAMUSCULAR | Status: DC | PRN

## 2021-09-22 MED ORDER — LIDOCAINE 5 % EX PTCH
1.0000 | MEDICATED_PATCH | Freq: Every day | CUTANEOUS | Status: DC | PRN
  Filled 2021-09-22: qty 1

## 2021-09-22 MED ORDER — CITALOPRAM HYDROBROMIDE 10 MG PO TABS: 10.0000 mg | ORAL_TABLET | Freq: Every day | ORAL | Status: DC

## 2021-09-22 MED ORDER — EZETIMIBE 10 MG PO TABS
10.0000 mg | ORAL_TABLET | Freq: Every day | ORAL | Status: DC
  Administered 2021-09-22 – 2021-09-24 (×3): 10 mg via ORAL
  Filled 2021-09-22 (×3): qty 1

## 2021-09-22 MED ORDER — LEVOTHYROXINE SODIUM 100 MCG PO TABS: 100.0000 ug | ORAL_TABLET | Freq: Every day | ORAL | Status: DC

## 2021-09-22 MED ORDER — ATORVASTATIN CALCIUM 40 MG PO TABS
40.0000 mg | ORAL_TABLET | Freq: Every day | ORAL | Status: DC
Start: 2021-09-22 — End: 2021-09-24
  Administered 2021-09-22 – 2021-09-24 (×3): 40 mg via ORAL
  Filled 2021-09-22 (×3): qty 1

## 2021-09-22 MED ORDER — SODIUM CHLORIDE 0.9 % IV BOLUS
500.0000 mL | Freq: Once | INTRAVENOUS | Status: AC
  Administered 2021-09-22: 500 mL via INTRAVENOUS

## 2021-09-22 MED ORDER — PNEUMOCOCCAL 20-VAL CONJ VACC 0.5 ML IM SUSY
0.5000 mL | PREFILLED_SYRINGE | INTRAMUSCULAR | Status: DC | PRN
  Filled 2021-09-22: qty 0.5

## 2021-09-22 MED ORDER — MIRTAZAPINE 15 MG PO TABS: 7.5000 mg | ORAL_TABLET | Freq: Every evening | ORAL | Status: DC | PRN

## 2021-09-22 MED ORDER — NITROGLYCERIN 0.4 MG SL SUBL: 0.4000 mg | SUBLINGUAL_TABLET | SUBLINGUAL | Status: DC | PRN

## 2021-09-22 MED ORDER — LEVOTHYROXINE SODIUM 88 MCG PO TABS
88.0000 ug | ORAL_TABLET | Freq: Every day | ORAL | Status: DC
  Administered 2021-09-23 – 2021-09-24 (×2): 88 ug via ORAL
  Filled 2021-09-22 (×2): qty 1

## 2021-09-22 MED ORDER — ACETAMINOPHEN 325 MG PO TABS
650.0000 mg | ORAL_TABLET | Freq: Four times a day (QID) | ORAL | Status: DC | PRN
  Administered 2021-09-23: 650 mg via ORAL
  Filled 2021-09-22: qty 2

## 2021-09-22 MED ORDER — ASPIRIN 81 MG PO CHEW
81.0000 mg | CHEWABLE_TABLET | Freq: Every day | ORAL | Status: DC
Start: 2021-09-22 — End: 2021-09-24
  Administered 2021-09-22 – 2021-09-24 (×3): 81 mg via ORAL
  Filled 2021-09-22 (×3): qty 1

## 2021-09-22 MED ORDER — ISOSORBIDE MONONITRATE ER 30 MG PO TB24
30.0000 mg | ORAL_TABLET | Freq: Every day | ORAL | Status: DC
  Administered 2021-09-22 – 2021-09-24 (×3): 30 mg via ORAL
  Filled 2021-09-22 (×3): qty 1

## 2021-09-22 MED ORDER — ONDANSETRON HCL 4 MG PO TABS: 4.0000 mg | ORAL_TABLET | Freq: Four times a day (QID) | ORAL | Status: DC | PRN

## 2021-09-22 MED ORDER — ENOXAPARIN SODIUM 30 MG/0.3ML IJ SOSY
30.0000 mg | PREFILLED_SYRINGE | INTRAMUSCULAR | Status: DC
  Administered 2021-09-22 – 2021-09-23 (×2): 30 mg via SUBCUTANEOUS
  Filled 2021-09-22 (×2): qty 0.3

## 2021-09-22 MED ORDER — SODIUM BICARBONATE 650 MG PO TABS
650.0000 mg | ORAL_TABLET | Freq: Every morning | ORAL | Status: DC
  Administered 2021-09-23 – 2021-09-24 (×2): 650 mg via ORAL
  Filled 2021-09-22 (×2): qty 1

## 2021-09-22 MED ORDER — AMLODIPINE BESYLATE 10 MG PO TABS
10.0000 mg | ORAL_TABLET | Freq: Every day | ORAL | Status: DC
  Administered 2021-09-22 – 2021-09-24 (×3): 10 mg via ORAL
  Filled 2021-09-22: qty 2
  Filled 2021-09-22 (×2): qty 1

## 2021-09-22 MED ORDER — LINAGLIPTIN 5 MG PO TABS
5.0000 mg | ORAL_TABLET | Freq: Every day | ORAL | Status: DC
Start: 2021-09-22 — End: 2021-09-24
  Administered 2021-09-22 – 2021-09-24 (×3): 5 mg via ORAL
  Filled 2021-09-22 (×3): qty 1

## 2021-09-22 MED ORDER — SODIUM CHLORIDE 0.9 % IV SOLN: INTRAVENOUS | Status: DC

## 2021-09-22 MED ORDER — ENSURE ENLIVE PO LIQD
237.0000 mL | Freq: Two times a day (BID) | ORAL | Status: DC
  Administered 2021-09-24: 237 mL via ORAL

## 2021-09-22 MED ORDER — SODIUM CHLORIDE 0.9 % IV SOLN: Freq: Once | INTRAVENOUS | Status: AC

## 2021-09-22 MED ORDER — INSULIN ASPART 100 UNIT/ML IJ SOLN
0.0000 [IU] | Freq: Three times a day (TID) | INTRAMUSCULAR | Status: DC
  Administered 2021-09-22: 2 [IU] via SUBCUTANEOUS
  Administered 2021-09-23: 1 [IU] via SUBCUTANEOUS
  Administered 2021-09-23 – 2021-09-24 (×2): 3 [IU] via SUBCUTANEOUS

## 2021-09-22 NOTE — ED Notes (Signed)
Spoke to patient's daughter Albertine Patricia via phone and notified of incident of finding patient on the floor and updated her on plan of care.   ?

## 2021-09-22 NOTE — ED Notes (Signed)
Medications reviewed with patient and daughter. ?

## 2021-09-22 NOTE — ED Notes (Signed)
Pt asleep at this time, will review home medications with her upon awakening. ?

## 2021-09-22 NOTE — ED Provider Notes (Signed)
Patient was found on the floor, apparently had an unwitnessed fall.  She is complaining of pain in her neck, unable to state what happened.  On exam, there is mild tenderness in the posterior cervical spine but she has normal strength in both arms and both legs and normal sensation.  She will be sent for CT of head and cervical spine to rule out occult injury. ? ?CT scans showed no acute injury.  No evidence of intracranial bleed, skull fracture, cervical spine fracture.  I have independently viewed the images, and agree with the radiologist's interpretation. ? ? ?CT Head Wo Contrast ? ?Result Date: 09/22/2021 ?CLINICAL DATA:  Unwitnessed fall.  Neck pain. EXAM: CT HEAD WITHOUT CONTRAST CT CERVICAL SPINE WITHOUT CONTRAST TECHNIQUE: Multidetector CT imaging of the head and cervical spine was performed following the standard protocol without intravenous contrast. Multiplanar CT image reconstructions of the cervical spine were also generated. RADIATION DOSE REDUCTION: This exam was performed according to the departmental dose-optimization program which includes automated exposure control, adjustment of the mA and/or kV according to patient size and/or use of iterative reconstruction technique. COMPARISON:  Head CT 04/23/2021. Head and cervical spine CT 12/20/2019. FINDINGS: CT HEAD FINDINGS Brain: There is no evidence for acute hemorrhage, hydrocephalus, mass lesion, or abnormal extra-axial fluid collection. No definite CT evidence for acute infarction. Diffuse loss of parenchymal volume is consistent with atrophy. Patchy low attenuation in the deep hemispheric and periventricular white matter is nonspecific, but likely reflects chronic microvascular ischemic demyelination. Tiny lacunar infarct noted right cerebellum Vascular: No hyperdense vessel or unexpected calcification. Skull: No evidence for fracture. No worrisome lytic or sclerotic lesion. Sinuses/Orbits: Trace air-fluid level noted right sphenoid sinuses.  Remaining visualized paranasal sinuses and mastoid air cells are clear. Visualized portions of the globes and intraorbital fat are unremarkable. Other: None. CT CERVICAL SPINE FINDINGS Alignment: Normal. Skull base and vertebrae: No acute fracture. No primary bone lesion or focal pathologic process. Soft tissues and spinal canal: No prevertebral fluid or swelling. No visible canal hematoma. Disc levels: Loss of disc height noted C5-6, C6-7 and C7-T1. Facets are well aligned bilaterally. Upper chest: Unremarkable. Other: None. IMPRESSION: 1. No acute intracranial abnormality. 2. Atrophy with chronic microvascular ischemic demyelination. 3. Trace air-fluid level right sphenoid sinus, stable since 12/20/2019. Findings may reflect chronic sinusitis. 4. No cervical fracture or subluxation. 5. Degenerative changes in the cervical spine as above. Electronically Signed   By: Misty Stanley M.D.   On: 09/22/2021 05:26  ? ?CT Cervical Spine Wo Contrast ? ?Result Date: 09/22/2021 ?CLINICAL DATA:  Unwitnessed fall.  Neck pain. EXAM: CT HEAD WITHOUT CONTRAST CT CERVICAL SPINE WITHOUT CONTRAST TECHNIQUE: Multidetector CT imaging of the head and cervical spine was performed following the standard protocol without intravenous contrast. Multiplanar CT image reconstructions of the cervical spine were also generated. RADIATION DOSE REDUCTION: This exam was performed according to the departmental dose-optimization program which includes automated exposure control, adjustment of the mA and/or kV according to patient size and/or use of iterative reconstruction technique. COMPARISON:  Head CT 04/23/2021. Head and cervical spine CT 12/20/2019. FINDINGS: CT HEAD FINDINGS Brain: There is no evidence for acute hemorrhage, hydrocephalus, mass lesion, or abnormal extra-axial fluid collection. No definite CT evidence for acute infarction. Diffuse loss of parenchymal volume is consistent with atrophy. Patchy low attenuation in the deep hemispheric  and periventricular white matter is nonspecific, but likely reflects chronic microvascular ischemic demyelination. Tiny lacunar infarct noted right cerebellum Vascular: No hyperdense vessel or unexpected calcification.  Skull: No evidence for fracture. No worrisome lytic or sclerotic lesion. Sinuses/Orbits: Trace air-fluid level noted right sphenoid sinuses. Remaining visualized paranasal sinuses and mastoid air cells are clear. Visualized portions of the globes and intraorbital fat are unremarkable. Other: None. CT CERVICAL SPINE FINDINGS Alignment: Normal. Skull base and vertebrae: No acute fracture. No primary bone lesion or focal pathologic process. Soft tissues and spinal canal: No prevertebral fluid or swelling. No visible canal hematoma. Disc levels: Loss of disc height noted C5-6, C6-7 and C7-T1. Facets are well aligned bilaterally. Upper chest: Unremarkable. Other: None. IMPRESSION: 1. No acute intracranial abnormality. 2. Atrophy with chronic microvascular ischemic demyelination. 3. Trace air-fluid level right sphenoid sinus, stable since 12/20/2019. Findings may reflect chronic sinusitis. 4. No cervical fracture or subluxation. 5. Degenerative changes in the cervical spine as above. Electronically Signed   By: Misty Stanley M.D.   On: 09/22/2021 05:26  ?  ?Delora Fuel, MD ?99/77/41 [phone] ? ?

## 2021-09-22 NOTE — ED Notes (Signed)
Patient returned from CT

## 2021-09-22 NOTE — ED Notes (Signed)
Report given to [facility]. 

## 2021-09-22 NOTE — ED Notes (Signed)
Pt eating breakfast at this time, family at bedside. Pt alert and oriented, NAD noted ?

## 2021-09-22 NOTE — ED Notes (Signed)
Checked CBG 219, RN Amber informed ? ?

## 2021-09-22 NOTE — ED Notes (Addendum)
Rounded on patient.  Previous round at 0330, and patient resting in bed with eyes closed, respirations even and unlabored.  Patient found to be sitting on floor beside the bed.  Patient had self removed IV catheter from arm.  Patient had removed her gown.  Patient denies pain when asked.  When asked if she had a fall, she states she is unsure whether she fell or sat down on the floor.  No visible signs of injury.  Patient assisted back into bed using 1 person assist.  Patient then stated that she had some neck discomfort, but unable to rate on numeric scale.  New gown applied.  EKG performed.  New peripheral IV inserted that is a #20 gauge to L AC.  NS restarted.  Patient does not recall getting out of bed, or why.  Charge nurse Caralyn Guile, RN notified.  Dr. Roxanne Mins (ED physician) notified.  Order obtained for CT scans.  Patient given warm blanket per request.   ?

## 2021-09-22 NOTE — ED Notes (Signed)
Pt in room resting ? ?

## 2021-09-22 NOTE — H&P (Signed)
?History and Physical  ? ? ?Patient: Molly Cobb BHA:[id number] DOB: 04/25/1932 ?DOA: 09/21/2021 ?DOS: the patient was seen and examined on 09/22/2021 ?PCP: Jonathon Bellows, PA-C  ?Patient coming from: Home ? ?Chief Complaint:  ?Chief Complaint  ?Patient presents with  ? Urinary Frequency  ? Abdominal Pain  ? ?HPI: Molly Cobb is a 88 y.o. female with medical history significant of CKD, CAD, depression, type II DM, gout, hypertension, hypothyroidism, severe aortic stenosis with history of TAVR, history of pacemaker placement, hypothyroidism who  ? to the emergency department due to frequent urination and lower abdominal pain for the past week.  Positive right flank pain worsens with positional changes, but no fever, chills or hematuria.  No nausea, emesis, chest pain, dyspnea, palpitations, orthopnea or recent lower extremity edema.  She has been drinking a lot of water to help clear questionable infection.  However, her sodium decreases times daily to 125 mmol/L.  Urinalysis did not look particularly remarkable. ? ?ED course: Initial vital signs were temperature 98.5 ?F, pulse 70, respiration 18, BP 148/72 mmHg O2 sat 99% on room air.  The patient was started on NS at 50 mL/h after being given a 500 mL normal saline bolus. ? ?Lab work:  Urinalysis with trace hemoglobinuria, proteinuria 30 mg/dL and small leukocyte esterase.  Microscopic examination shows 6-10 WBC and rare bacteria. CBC is her white count 11.5, hemoglobin 11.9 g/dL platelets 17  Urine osmolality was 276 and random urine sodium was  70.  TSH and cortisol were unremarkable. ? ?Imaging: CT renal protocol with multiple calcifications in both kidneys likely from renal artery branches but unable to fully ruled out nonobstructing renal stones.  CT head and neck with no acute changes.   please see images and full radiology report for further details. ? ?Review of Systems: As mentioned in the history of present illness. All other systems reviewed and  are negative. ?Past Medical History:  ?Diagnosis Date  ? CKD (chronic kidney disease)   ? a. followed by Dr. Neta Ehlers at [facility]   ? Coronary artery disease   ? 02/23/17 PCI/DES to mRCA,  normal EF  ? Depression   ? Diabetes mellitus ([place])   ? Gout   ? Hypertension   ? Hypothyroidism   ? Presence of permanent cardiac pacemaker   ? a. followed by [facility]  ? S/P TAVR (transcatheter aortic valve replacement) 04/12/2017  ? 23 mm Edwards Sapien 3 transcatheter heart valve placed via percutaneous right transfemoral approach   ? Severe aortic stenosis   ? a. diagnosed 01/2017 during admission for chest pain.   ? Thyroid disease   ? ?Past Surgical History:  ?Procedure Laterality Date  ? APPENDECTOMY    ? CESAREAN SECTION    ? CORONARY ATHERECTOMY N/A 02/23/2017  ? Procedure: CORONARY ATHERECTOMY;  Surgeon: [name], MD;  Location: [place] [facility];  Service: Cardiovascular;  Laterality: N/A;  ? ESOPHAGOGASTRODUODENOSCOPY N/A 02/18/2017  ? Procedure: ESOPHAGOGASTRODUODENOSCOPY (EGD);  Surgeon: Milus Banister, MD;  Location: [facility] ENDOSCOPY;  Service: Endoscopy;  Laterality: N/A;  ? EYE SURGERY    ? FEMUR FRACTURE SURGERY    ? HOT HEMOSTASIS N/A 02/18/2017  ? Procedure: HOT HEMOSTASIS (ARGON PLASMA COAGULATION/BICAP);  Surgeon: Milus Banister, MD;  Location: [facility] ENDOSCOPY;  Service: Endoscopy;  Laterality: N/A;  ? IR RADIOLOGY PERIPHERAL GUIDED IV START  03/07/2017  ? IR US GUIDE VASC ACCESS RIGHT  03/07/2017  ? RIGHT/LEFT HEART CATH AND CORONARY ANGIOGRAPHY N/A 02/16/2017  ? Procedure:  RIGHT/LEFT HEART CATH AND CORONARY ANGIOGRAPHY;  Surgeon: Wellington Hampshire, MD;  Location: [place] [facility];  Service: Cardiovascular;  Laterality: N/A;  ? ROTATOR CUFF REPAIR    ? TEE WITHOUT CARDIOVERSION N/A 04/12/2017  ? Procedure: TRANSESOPHAGEAL ECHOCARDIOGRAM (TEE);  Surgeon: [name], MD;  Location: [facility];  Service: Open Heart Surgery;  Laterality: N/A;  ? THYROID SURGERY    ? TRANSCATHETER AORTIC VALVE REPLACEMENT,  TRANSFEMORAL N/A 04/12/2017  ? Procedure: TRANSCATHETER AORTIC VALVE REPLACEMENT, TRANSFEMORAL;  Surgeon: [name], MD;  Location: [facility];  Service: Open Heart Surgery;  Laterality: N/A;  ? ?Social History:  reports that she has never smoked. She has never used smokeless tobacco. She reports that she does not drink alcohol and does not use drugs. ? ?Allergies  ?Allergen Reactions  ? Oxybutynin Other (See Comments)  ?  Caused hallucinations  ? Oysters [Shellfish Allergy] Nausea And Vomiting  ? Sulfa Antibiotics Nausea And Vomiting  ? ? ?Family History  ?Problem Relation Age of Onset  ? CAD Mother   ?     MI in her mid 66's  ? Stroke Mother   ?     Light stroke  ? Diabetes Father   ? Hypertension Sister   ? Healthy Sister   ? Healthy Sister   ? Lung cancer Sister   ? Heart disease Neg Hx   ? ? ?Prior to Admission medications   ?Medication Sig Start Date End Date Taking? Authorizing Provider  ?amLODipine (NORVASC) 10 MG tablet Take 10 mg by mouth daily.   Yes [provider]  ?ascorbic Acid (VITAMIN C) 500 MG CPCR daily at 0600. 02/08/11  Yes [provider]  ?aspirin 81 MG chewable tablet Chew 1 tablet (81 mg total) by mouth daily. 02/25/17  Yes Reino Bellis B, NP  ?atorvastatin (LIPITOR) 40 MG tablet Take 40 mg by mouth daily. 08/16/21  Yes [provider]  ?B Complex Vitamins (VITAMIN B COMPLEX PO) Take 1 capsule by mouth daily.   Yes [provider]  ?Calcium Carbonate (CALCIUM 600 PO) Take 1 tablet by mouth daily.   Yes [provider]  ?cholecalciferol (VITAMIN D3) 25 MCG (1000 UNIT) tablet Take 1,000 Units by mouth daily. Takes every other day   Yes [provider]  ?citalopram (CELEXA) 10 MG tablet Take 1 tablet by mouth daily. 01/25/19  Yes [provider]  ?Coenzyme Q10 (COQ-10) 100 MG CAPS Take 100 mg by mouth daily.   Yes [provider]  ?ezetimibe (ZETIA) 10 MG tablet Take 10 mg by mouth daily. 09/09/21  Yes [provider]  ?GLIPIZIDE XL 5 MG 24 hr tablet Take 5 mg daily by mouth. 04/27/17  Yes [provider]  ?isosorbide mononitrate (IMDUR) 30 MG 24 hr tablet Take 1 tablet by mouth daily. 07/01/21  Yes [provider]  ?levothyroxine (SYNTHROID, LEVOTHROID) 100 MCG tablet Take 100 mcg by mouth daily before breakfast.   Yes [provider]  ?metoprolol tartrate (LOPRESSOR) 25 MG tablet TAKE 1/2 (ONE-HALF) TABLET BY MOUTH TWICE DAILY 06/22/17  Yes Reino Bellis B, NP  ?pantoprazole (PROTONIX) 40 MG tablet Take 1 tablet (40 mg total) by mouth daily. 03/23/17  Yes Daune Perch, NP  ?rosuvastatin (CRESTOR) 40 MG tablet TAKE 1 TABLET BY MOUTH ONCE DAILY AT BEDTIME 07/01/17  Yes [name], MD  ?sodium bicarbonate 650 MG tablet Take 650 mg by mouth 2 (two) times daily.    Yes [provider]  ?TRADJENTA 5 MG  TABS tablet Take 5 mg by mouth daily. 09/21/21  Yes [provider]  ?diclofenac Sodium (VOLTAREN) 1 % GEL Apply 4 g topically 4 (four) times daily. ?Patient not taking: Reported on 09/22/2021 12/20/19   Deno Etienne, DO  ?hydrOXYzine (ATARAX) 10 MG tablet Take 10 mg by mouth 2 (two) times daily as needed. 09/08/21   [provider]  ?nitroGLYCERIN (NITROSTAT) 0.4 MG SL tablet Place 1 tablet (0.4 mg total) under the tongue every 5 (five) minutes as needed for chest pain. 04/21/17   Eileen Stanford, PA-C  ? ? ?Physical Exam: ?Vitals:  ? 09/22/21 1000 09/22/21 1100 09/22/21 1200 09/22/21 1228  ?BP: (!) 154/55 (!) 174/66 (!) 154/61 (!) 154/63  ?Pulse: 70 70 79 69  ?Resp: (!) 21 19 18 18   ?Temp:    98.5 ?F (36.9 ?C)  ?TempSrc:    Oral  ?SpO2: 100% 100% 100% 100%  ?Weight:      ?Height:      ? ?Physical Exam ?Vitals and nursing note reviewed.  ?Constitutional:   ?   Appearance: She is well-developed. She is obese.  ?HENT:  ?   Head: Normocephalic.  ?   Mouth/Throat:  ?   Mouth: Mucous membranes are moist.  ?Eyes:  ?   Pupils: Pupils are equal, round, and reactive to light.   ?Cardiovascular:  ?   Rate and Rhythm: Normal rate and regular rhythm.  ?   Heart sounds: S1 normal and S2 normal.  ?Pulmonary:  ?   Effort: Pulmonary effort is normal.  ?   Breath sounds: Normal breath sounds.

## 2021-09-22 NOTE — ED Notes (Signed)
[facility] at bedside to transport patient. 

## 2021-09-22 NOTE — ED Notes (Signed)
Irene Pap, DO with hospitalist service notified of incident where patient was found sitting in floor of room.  ?

## 2021-09-23 DIAGNOSIS — I129 Hypertensive chronic kidney disease with stage 1 through stage 4 chronic kidney disease, or unspecified chronic kidney disease: Secondary | ICD-10-CM | POA: Diagnosis present

## 2021-09-23 DIAGNOSIS — I2510 Atherosclerotic heart disease of native coronary artery without angina pectoris: Secondary | ICD-10-CM | POA: Diagnosis present

## 2021-09-23 DIAGNOSIS — Z823 Family history of stroke: Secondary | ICD-10-CM | POA: Diagnosis not present

## 2021-09-23 DIAGNOSIS — N184 Chronic kidney disease, stage 4 (severe): Secondary | ICD-10-CM | POA: Diagnosis present

## 2021-09-23 DIAGNOSIS — Z801 Family history of malignant neoplasm of trachea, bronchus and lung: Secondary | ICD-10-CM | POA: Diagnosis not present

## 2021-09-23 DIAGNOSIS — R350 Frequency of micturition: Secondary | ICD-10-CM | POA: Diagnosis present

## 2021-09-23 DIAGNOSIS — I350 Nonrheumatic aortic (valve) stenosis: Secondary | ICD-10-CM | POA: Diagnosis present

## 2021-09-23 DIAGNOSIS — E871 Hypo-osmolality and hyponatremia: Secondary | ICD-10-CM | POA: Diagnosis present

## 2021-09-23 DIAGNOSIS — W19XXXA Unspecified fall, initial encounter: Secondary | ICD-10-CM | POA: Diagnosis present

## 2021-09-23 DIAGNOSIS — E039 Hypothyroidism, unspecified: Secondary | ICD-10-CM | POA: Diagnosis present

## 2021-09-23 DIAGNOSIS — Z833 Family history of diabetes mellitus: Secondary | ICD-10-CM | POA: Diagnosis not present

## 2021-09-23 DIAGNOSIS — Z953 Presence of xenogenic heart valve: Secondary | ICD-10-CM | POA: Diagnosis not present

## 2021-09-23 DIAGNOSIS — Z888 Allergy status to other drugs, medicaments and biological substances status: Secondary | ICD-10-CM | POA: Diagnosis not present

## 2021-09-23 DIAGNOSIS — Z8249 Family history of ischemic heart disease and other diseases of the circulatory system: Secondary | ICD-10-CM | POA: Diagnosis not present

## 2021-09-23 DIAGNOSIS — Z881 Allergy status to other antibiotic agents status: Secondary | ICD-10-CM | POA: Diagnosis not present

## 2021-09-23 DIAGNOSIS — D631 Anemia in chronic kidney disease: Secondary | ICD-10-CM | POA: Diagnosis present

## 2021-09-23 DIAGNOSIS — Z950 Presence of cardiac pacemaker: Secondary | ICD-10-CM | POA: Diagnosis not present

## 2021-09-23 DIAGNOSIS — Z91013 Allergy to seafood: Secondary | ICD-10-CM | POA: Diagnosis not present

## 2021-09-23 DIAGNOSIS — M109 Gout, unspecified: Secondary | ICD-10-CM | POA: Diagnosis present

## 2021-09-23 DIAGNOSIS — E1122 Type 2 diabetes mellitus with diabetic chronic kidney disease: Secondary | ICD-10-CM | POA: Diagnosis present

## 2021-09-23 DIAGNOSIS — R296 Repeated falls: Secondary | ICD-10-CM | POA: Diagnosis present

## 2021-09-23 DIAGNOSIS — F32A Depression, unspecified: Secondary | ICD-10-CM | POA: Diagnosis present

## 2021-09-23 DIAGNOSIS — Z79890 Hormone replacement therapy (postmenopausal): Secondary | ICD-10-CM | POA: Diagnosis not present

## 2021-09-23 DIAGNOSIS — Z7984 Long term (current) use of oral hypoglycemic drugs: Secondary | ICD-10-CM | POA: Diagnosis not present

## 2021-09-23 DIAGNOSIS — Z7982 Long term (current) use of aspirin: Secondary | ICD-10-CM | POA: Diagnosis not present

## 2021-09-23 LAB — CBC
HCT: 34 % — ABNORMAL LOW (ref 36.0–46.0)
Hemoglobin: 11.2 g/dL — ABNORMAL LOW (ref 12.0–15.0)
MCH: 28.4 pg (ref 26.0–34.0)
MCHC: 32.9 g/dL (ref 30.0–36.0)
MCV: 86.3 fL (ref 80.0–100.0)
Platelets: 152 10*3/uL (ref 150–400)
RBC: 3.94 MIL/uL (ref 3.87–5.11)
RDW: 14.1 % (ref 11.5–15.5)
WBC: 10.8 10*3/uL — ABNORMAL HIGH (ref 4.0–10.5)
nRBC: 0 % (ref 0.0–0.2)

## 2021-09-23 LAB — BASIC METABOLIC PANEL
Anion gap: 7 (ref 5–15)
Anion gap: 8 (ref 5–15)
BUN: 41 mg/dL — ABNORMAL HIGH (ref 8–23)
BUN: 39 mg/dL — ABNORMAL HIGH (ref 8–23)
CO2: 23 mmol/L (ref 22–32)
CO2: 22 mmol/L (ref 22–32)
Calcium: 8.7 mg/dL — ABNORMAL LOW (ref 8.9–10.3)
Calcium: 8.5 mg/dL — ABNORMAL LOW (ref 8.9–10.3)
Chloride: 101 mmol/L (ref 98–111)
Chloride: 102 mmol/L (ref 98–111)
Creatinine, Ser: 2.26 mg/dL — ABNORMAL HIGH (ref 0.44–1.00)
Creatinine, Ser: 2.19 mg/dL — ABNORMAL HIGH (ref 0.44–1.00)
GFR, Estimated: 20 mL/min — ABNORMAL LOW (ref >60)
GFR, Estimated: 21 mL/min — ABNORMAL LOW (ref >60)
Glucose, Bld: 111 mg/dL — ABNORMAL HIGH (ref 70–99)
Glucose, Bld: 139 mg/dL — ABNORMAL HIGH (ref 70–99)
Potassium: 4 mmol/L (ref 3.5–5.1)
Potassium: 3.8 mmol/L (ref 3.5–5.1)
Sodium: 131 mmol/L — ABNORMAL LOW (ref 135–145)
Sodium: 132 mmol/L — ABNORMAL LOW (ref 135–145)

## 2021-09-23 LAB — GLUCOSE, CAPILLARY
Glucose-Capillary: 103 mg/dL — ABNORMAL HIGH (ref 70–99)
Glucose-Capillary: 206 mg/dL — ABNORMAL HIGH (ref 70–99)
Glucose-Capillary: 149 mg/dL — ABNORMAL HIGH (ref 70–99)
Glucose-Capillary: 147 mg/dL — ABNORMAL HIGH (ref 70–99)

## 2021-09-23 LAB — HEMOGLOBIN A1C
Hgb A1c MFr Bld: 7.1 % — ABNORMAL HIGH (ref 4.8–5.6)
Mean Plasma Glucose: 157.07 mg/dL

## 2021-09-23 MED ORDER — METOPROLOL TARTRATE 5 MG/5ML IV SOLN: 5.0000 mg | INTRAVENOUS | Status: DC | PRN

## 2021-09-23 MED ORDER — HYDRALAZINE HCL 20 MG/ML IJ SOLN: 10.0000 mg | INTRAMUSCULAR | Status: DC | PRN

## 2021-09-23 MED ORDER — SODIUM CHLORIDE 0.9 % IV BOLUS
500.0000 mL | Freq: Once | INTRAVENOUS | Status: AC
  Administered 2021-09-23: 500 mL via INTRAVENOUS

## 2021-09-23 MED ORDER — HYDROXYZINE HCL 10 MG PO TABS
10.0000 mg | ORAL_TABLET | Freq: Two times a day (BID) | ORAL | Status: DC | PRN
  Filled 2021-09-23: qty 1

## 2021-09-23 MED ORDER — SENNOSIDES-DOCUSATE SODIUM 8.6-50 MG PO TABS
1.0000 | ORAL_TABLET | Freq: Every evening | ORAL | Status: DC | PRN
  Administered 2021-09-23: 1 via ORAL
  Filled 2021-09-23: qty 1

## 2021-09-23 MED ORDER — ESCITALOPRAM OXALATE 10 MG PO TABS
10.0000 mg | ORAL_TABLET | Freq: Every morning | ORAL | Status: DC
  Administered 2021-09-23 – 2021-09-24 (×2): 10 mg via ORAL
  Filled 2021-09-23 (×2): qty 1

## 2021-09-23 MED ORDER — DM-GUAIFENESIN ER 30-600 MG PO TB12: 1.0000 | ORAL_TABLET | Freq: Two times a day (BID) | ORAL | Status: DC | PRN

## 2021-09-23 NOTE — TOC Initial Note (Signed)
Transition of Care (TOC) - Initial/Assessment Note  ? ? ?Patient Details  ?Name: Molly Cobb ?MRN: [id number] ?Date of Birth: 1931-11-27 ? ?Transition of Care (TOC) CM/SW Contact:    ?Navy Rothschild, Marjie Skiff, RN ?Phone Number: ?09/23/2021, 2:29 PM ? ?Clinical Narrative:                 ?Spoke with pt at bedside for dc planning. Physical therapy recommendations gone over with pt. She agrees to short term SNF placement. She asks that I call her daughter to give her SNF bed offers when they become available. FL2 faxed out to area SNFs. ? ?Expected Discharge Plan: [place] ?Barriers to Discharge: Continued Medical Work up ? ? ?Patient Goals and CMS Choice ?Patient states their goals for this hospitalization and ongoing recovery are:: To get better ?  ?  ? ?Expected Discharge Plan and Services ?Expected Discharge Plan: [place] ?  ?Discharge Planning Services: CM Consult ?  ?Living arrangements for the past 2 months: Apartment ?                ?  ?  ?  ?  ?  ?  ?  ?  ?  ?  ? ?Prior Living Arrangements/Services ?Living arrangements for the past 2 months: Apartment ?Lives with:: Self ?Patient language and need for interpreter reviewed:: Yes ?       ?Need for Family Participation in Patient Care: Yes (Comment) ?Care giver support system in place?: Yes (comment) ?  ?Criminal Activity/Legal Involvement Pertinent to Current Situation/Hospitalization: No - Comment as needed ? ?Activities of Daily Living ?Home Assistive Devices/Equipment: Cane (specify quad or straight), Raised toilet seat with rails, Shower chair with back ?ADL Screening (condition at time of admission) ?Patient's cognitive ability adequate to safely complete daily activities?: Yes ?Is the patient deaf or have difficulty hearing?: Yes ?Does the patient have difficulty seeing, even when wearing glasses/contacts?: No ?Does the patient have difficulty concentrating, remembering, or making decisions?: No ?Patient able to express need for  assistance with ADLs?: Yes ?Does the patient have difficulty dressing or bathing?: No ?Independently performs ADLs?: Yes (appropriate for developmental age) ?Does the patient have difficulty walking or climbing stairs?: Yes (stairs) ?Weakness of Legs: Both ("a little bit sometimes" according to pt) ?Weakness of Arms/Hands: None ? ?Permission Sought/Granted ?  ?  ?   ?   ?   ?   ? ?Emotional Assessment ?Appearance:: Appears stated age ?Attitude/Demeanor/Rapport: Gracious ?Affect (typically observed): Calm ?Orientation: : Oriented to Self, Oriented to Place, Oriented to  Time, Oriented to Situation ?Alcohol / Substance Use: Not Applicable ?Psych Involvement: No (comment) ? ?Admission diagnosis:  Hyponatremia [E87.1] ?Patient Active Problem List  ? Diagnosis Date Noted  ? Frequency of micturition 09/22/2021  ? Hyponatremia 09/21/2021  ? Severe aortic stenosis 04/12/2017  ? S/P TAVR (transcatheter aortic valve replacement) 04/12/2017  ? CKD (chronic kidney disease) stage 4, GFR 15-29 ml/min (HCC) 03/23/2017  ? AVM (arteriovenous malformation) of stomach, acquired with hemorrhage   ? S/P placement of cardiac pacemaker 02/16/2017  ? Anemia in chronic renal disease 02/13/2017  ? Hypothyroidism (acquired) 06/29/2016  ? Type 2 diabetes mellitus with stage 4 chronic kidney disease, without long-term current use of insulin ([place]) 09/09/2015  ? Benign hypertension with CKD (chronic kidney disease) stage IV ([place]) 08/02/2015  ? ?PCP:  Jonathon Bellows, PA-C ?Pharmacy:   ?Insurance claims handler [address] [place], [place] - [address] ?[place] ?[place] [address] ?Phone:  [phone] Fax: [phone] ? ?[facility] ([facility] ) - [place], [place] ?[address] ?[place] [address] ?Phone: [phone] Fax: [phone] ? ? ? ? ?Social Determinants of Health (SDOH) Interventions ?  ? ?Readmission Risk Interventions ?   ? View : No data to display.   ?  ?  ?  ? ? ? ?

## 2021-09-23 NOTE — [place] FL2 (Signed)
?[place] MEDICA[place] FL2 LEVEL OF CARE SCREENING TOOL  ?  ? ?IDENTIFICATION  ?Patient Name: ?Araiya Tilmon Birthdate: 10-15-1931 Sex: female Admission Date (Current Location): ?09/21/2021  ?[place] and [place] Number: ? [place] ?  Facility and Address:  ?[facility],  [address] [place], [place] ?     Provider Number: ?[id number]  ?Attending Physician Name and Address:  ?Damita Lack, MD ? Relative Name and Phone Number:  ?  ?   ?Current Level of Care: ?Hospital Recommended Level of Care: ?[place] Prior Approval Number: ?  ? ?Date Approved/Denied: ?  PASRR Number: ?[id number] A ? ?Discharge Plan: ?SNF ?  ? ?Current Diagnoses: ?Patient Active Problem List  ? Diagnosis Date Noted  ? Frequency of micturition 09/22/2021  ? Hyponatremia 09/21/2021  ? Severe aortic stenosis 04/12/2017  ? S/P TAVR (transcatheter aortic valve replacement) 04/12/2017  ? CKD (chronic kidney disease) stage 4, GFR 15-29 ml/min (HCC) 03/23/2017  ? AVM (arteriovenous malformation) of stomach, acquired with hemorrhage   ? S/P placement of cardiac pacemaker 02/16/2017  ? Anemia in chronic renal disease 02/13/2017  ? Hypothyroidism (acquired) 06/29/2016  ? Type 2 diabetes mellitus with stage 4 chronic kidney disease, without long-term current use of insulin ([place]) 09/09/2015  ? Benign hypertension with CKD (chronic kidney disease) stage IV ([place]) 08/02/2015  ? ? ?Orientation RESPIRATION BLADDER Height & Weight   ?  ?Self, Time, Place ? Normal Incontinent Weight: 86.2 kg ?Height:  5\' 3"  (160 cm)  ?BEHAVIORAL SYMPTOMS/MOOD NEUROLOGICAL BOWEL NUTRITION STATUS  ?    Continent Diet (heart healthy, carb mod, 1200 fluid restriction)  ?AMBULATORY STATUS COMMUNICATION OF NEEDS Skin   ?Limited Assist Verbally Normal ?  ?  ?  ?    ?     ?     ? ? ?Personal Care Assistance Level of Assistance  ?Bathing, Feeding, Dressing Bathing Assistance: Limited assistance ?Feeding assistance: Independent ?Dressing Assistance: Limited  assistance ?   ? ?Functional Limitations Info  ?Sight, Hearing, Speech Sight Info: Adequate ?Hearing Info: Impaired ?Speech Info: Adequate  ? ? ?SPECIAL CARE FACTORS FREQUENCY  ?PT (By licensed PT), OT (By licensed OT)   ?  ?PT Frequency: 5 x weekly ?OT Frequency: 5 x weekly ?  ?  ?  ?   ? ? ?Contractures Contractures Info: Not present  ? ? ?Additional Factors Info  ?Allergies, Code Status Code Status Info: Full ?Allergies Info: Oxybutynin, Oysters, Sulfa Antibiotics ?  ?  ?  ?   ? ?Current Medications (09/23/2021):  This is the current hospital active medication list ?Current Facility-Administered Medications  ?Medication Dose Route Frequency Provider Last Rate Last Admin  ? 0.9 %  sodium chloride infusion   Intravenous Continuous Reubin Milan, MD 50 mL/hr at 09/23/21 1031 Restarted at 09/23/21 1031  ? acetaminophen (TYLENOL) tablet 650 mg  650 mg Oral Q6H PRN Reubin Milan, MD   650 mg at 09/23/21 0093  ? Or  ? acetaminophen (TYLENOL) suppository 650 mg  650 mg Rectal Q6H PRN Reubin Milan, MD      ? amLODipine (NORVASC) tablet 10 mg  10 mg Oral Daily Reubin Milan, MD   10 mg at 09/23/21 8182  ? aspirin chewable tablet 81 mg  81 mg Oral Daily Reubin Milan, MD   81 mg at 09/23/21 9937  ? atorvastatin (LIPITOR) tablet 40 mg  40 mg Oral Daily Reubin Milan, MD   40 mg at 09/23/21 1696  ? dextromethorphan-guaiFENesin (  MUCINEX DM) 30-600 MG per 12 hr tablet 1 tablet  1 tablet Oral BID PRN Amin, Ankit Chirag, MD      ? enoxaparin (LOVENOX) injection 30 mg  30 mg Subcutaneous Q24H Reubin Milan, MD   30 mg at 09/22/21 2225  ? escitalopram (LEXAPRO) tablet 10 mg  10 mg Oral q morning Amin, Ankit Chirag, MD   10 mg at 09/23/21 9449  ? ezetimibe (ZETIA) tablet 10 mg  10 mg Oral Daily Reubin Milan, MD   10 mg at 09/23/21 6759  ? feeding supplement (ENSURE ENLIVE / ENSURE PLUS) liquid 237 mL  237 mL Oral BID BM Reubin Milan, MD      ? hydrALAZINE (APRESOLINE) injection  10 mg  10 mg Intravenous Q4H PRN Amin, Jeanella Flattery, MD      ? hydrOXYzine (ATARAX) tablet 10 mg  10 mg Oral BID PRN Amin, Ankit Chirag, MD      ? influenza vaccine adjuvanted (FLUAD) injection 0.5 mL  0.5 mL Intramuscular Prior to discharge Reubin Milan, MD      ? insulin aspart (novoLOG) injection 0-9 Units  0-9 Units Subcutaneous TID WC Reubin Milan, MD   2 Units at 09/22/21 1612  ? isosorbide mononitrate (IMDUR) 24 hr tablet 30 mg  30 mg Oral Daily Reubin Milan, MD   30 mg at 09/23/21 0935  ? levothyroxine (SYNTHROID) tablet 88 mcg  88 mcg Oral QAC breakfast Reubin Milan, MD   88 mcg at 09/23/21 1638  ? lidocaine (LIDODERM) 5 % 1 patch  1 patch Transdermal Daily PRN Reubin Milan, MD      ? linagliptin [facility]) tablet 5 mg  5 mg Oral Daily Reubin Milan, MD   5 mg at 09/23/21 4665  ? metoprolol tartrate (LOPRESSOR) injection 5 mg  5 mg Intravenous Q4H PRN Amin, Ankit Chirag, MD      ? metoprolol tartrate (LOPRESSOR) tablet 25 mg  25 mg Oral BID Reubin Milan, MD   25 mg at 09/23/21 0935  ? mirtazapine (REMERON) tablet 7.5 mg  7.5 mg Oral QHS PRN Reubin Milan, MD      ? nitroGLYCERIN (NITROSTAT) SL tablet 0.4 mg  0.4 mg Sublingual Q5 Min x 3 PRN Reubin Milan, MD      ? ondansetron [facility]) tablet 4 mg  4 mg Oral Q6H PRN Reubin Milan, MD      ? Or  ? ondansetron [facility]) injection 4 mg  4 mg Intravenous Q6H PRN Reubin Milan, MD      ? pantoprazole (PROTONIX) EC tablet 40 mg  40 mg Oral Daily Reubin Milan, MD   40 mg at 09/23/21 0935  ? pneumococcal 20-valent conjugate vaccine (PREVNAR 20) injection 0.5 mL  0.5 mL Intramuscular Prior to discharge Reubin Milan, MD      ? senna-docusate (Senokot-S) tablet 1 tablet  1 tablet Oral QHS PRN Damita Lack, MD      ? sodium bicarbonate tablet 650 mg  650 mg Oral q morning Reubin Milan, MD   650 mg at 09/23/21 0935  ? ? ? ?Discharge Medications: ?Please see discharge  summary for a list of discharge medications. ? ?Relevant Imaging Results: ? ?Relevant Lab Results: ? ? ?Additional Information ?SS# [id number] Pfizer vaccines x 4 ? ?Rozlyn Yerby, Marjie Skiff, RN ? ? ? ? ?

## 2021-09-23 NOTE — Progress Notes (Signed)
Chaplain engaged in an initial visit with [facility].  Molly Cobb shared about feeling lonely and experiencing memory loss.  Molly Cobb explained that she lives alone in an apartment.  Most days she doesn't have anyone to converse with.  She also is no longer able to drive so she isn't able to go see others.  Molly Cobb expressed how hard that has been on her.  She truly values community and conversation and seemingly now feels shut off from the world.  She stated that she finds herself to talking to God a lot more because she has no one physically present with her.  Chaplain asked Molly Cobb about her support system.  Molly Cobb has a daughter who doesn't live in [place] and doesn't have a home that can accommodate Molly Cobb's needs. She also noted that she does have family but that they all seem too busy to check in on her and offer her listening and support.  Molly Cobb is looking forward to going to rehab because she'll be around other people.  ? ?Molly Cobb also spent some time talking about her memory loss.  She described specific events in which she couldn't remember how or why she had done something.  She noted that she used to do crossword puzzles regularly and now she sometimes can't figure out the words.  Chaplain could assess that losing her memory has been a source of confusion and pain for [facility].  She noted several times, "I'm not crazy." ? ?Molly Cobb was grateful to have someone to talk to and listen to her.  She values presence and community.  Chaplain was also able to put on a movie for her so she wouldn't be sitting in silence in her hospital room.  Chaplain offered a compassionate presence, listening and support. ? ? ? 09/23/21 1300  ?Clinical Encounter Type  ?Visited With Patient  ?Visit Type Initial;Spiritual support  ? ? ?

## 2021-09-23 NOTE — Progress Notes (Signed)
?PROGRESS NOTE ? ? ? ?Molly Cobb  WUJ:[id number] DOB: Jan 05, 1932 DOA: 09/21/2021 ?PCP: Jonathon Bellows, PA-C  ? ?Brief Narrative:  ?88 y.o. female with medical history significant of CKD, CAD, depression, type II DM, gout, hypertension, hypothyroidism, severe aortic stenosis with history of TAVR, history of pacemaker placement, hypothyroidism who  ? to the emergency department due to frequent urination and lower abdominal pain for the past week.   ? ? ?Assessment & Plan: ? Principal Problem: ?  Hyponatremia ?Active Problems: ?  Benign hypertension with CKD (chronic kidney disease) stage IV (HCC) ?  Hypothyroidism (acquired) ?  Type 2 diabetes mellitus with stage 4 chronic kidney disease, without long-term current use of insulin ([place]) ?  Anemia in chronic renal disease ?  Frequency of micturition ?  ?  Hyponatremia ?- Na has improved with IVF. Advised to restrict free fluid itself. Will give NS 500cc bolus followed by infusion. Na this morning 131, Repeat labs at 11am.  ? ?Weakness with freq falls at home ?-CT head and C spine neg.  ?TSH, am cortisol Normal ? ?  ? ?  Benign hypertension with  ?CKD (chronic kidney disease) stage IV ([place]) ?-Resume home regimen. NaBicarb Daily. Ct at baseline 2.26 ?Cont Norvasc and Imdur.  ?  ?  Hypothyroidism (acquired) ?Continue levothyroxine 88 mcg p.o. daily. ?  ?  Type 2 diabetes mellitus ? without long-term current use of insulin ([place]) ?Carbohydrate modified/renal diet. A1c 7.1 ?Continue Tradjenta 5 mg p.o. daily. ?  ?  Anemia in chronic renal disease ?Hb stable.  ?  ?  Frequency of micturition ?UA - neg ?  ? PT/OT - rec SNF.  ? ? ? ?DVT prophylaxis: Lovenox ?Code Status: Full  ?Family Communication:  Daughter updated, agreeable for SNF ? ?Na improved. PT recommended SNF, so TOC consulted.  ? ? ?Subjective: ?Feeling better, weak overall.  ? ? ? ?Examination: ? ?General exam: Appears calm and comfortable  ?Respiratory system: Clear to auscultation. Respiratory effort  normal. ?Cardiovascular system: S1 & S2 heard, RRR. No JVD, murmurs, rubs, gallops or clicks. No pedal edema. ?Gastrointestinal system: Abdomen is nondistended, soft and nontender. No organomegaly or masses felt. Normal bowel sounds heard. ?Central nervous system: Alert and oriented. No focal neurological deficits. ?Extremities: Symmetric 5 x 5 power. ?Skin: No rashes, lesions or ulcers ?Psychiatry: Judgement and insight appear normal. Mood & affect appropriate.  ? ? ? ?Objective: ?Vitals:  ? 09/22/21 1942 09/22/21 2207 09/23/21 0224 09/23/21 0526  ?BP:  (!) 141/118 126/68 (!) 147/71  ?Pulse: 77 70 71 69  ?Resp: 17 14 14 14   ?Temp:  98.1 ?F (36.7 ?C) 98.1 ?F (36.7 ?C) 97.9 ?F (36.6 ?C)  ?TempSrc:  Oral Oral Oral  ?SpO2: 96% 100% 100% 99%  ?Weight:      ?Height:      ? ? ?Intake/Output Summary (Last 24 hours) at 09/23/2021 1031 ?Last data filed at 09/23/2021 9562 ?Gross per 24 hour  ?Intake 1136.94 ml  ?Output 2100 ml  ?Net -963.06 ml  ? ?Filed Weights  ? 09/21/21 1826  ?Weight: 86.2 kg  ? ? ? ?Data Reviewed:  ? ?CBC: ?Recent Labs  ?Lab 09/21/21 ?1858 09/23/21 ?0503  ?WBC 11.5* 10.8*  ?NEUTROABS 8.6*  --   ?HGB 11.9* 11.2*  ?HCT 34.8* 34.0*  ?MCV 83.1 86.3  ?PLT 172 152  ? ?Basic Metabolic Panel: ?Recent Labs  ?Lab 09/21/21 ?1858 09/22/21 ?0824 09/22/21 ?1903 09/23/21 ?0503  ?NA 125* 128* 129* 131*  ?K 4.0 4.1 4.0 4.0  ?  CL 93* 97* 99 101  ?CO2 21* 23 22 23   ?GLUCOSE 149* 153* 150* 111*  ?BUN 42* 43* 41* 41*  ?CREATININE 2.39* 2.36* 2.18* 2.26*  ?CALCIUM 9.2 8.7* 8.7* 8.7*  ? ?GFR: ?Estimated Creatinine Clearance: 17.9 mL/min (A) (by C-G formula based on SCr of 2.26 mg/dL (H)). ?Liver Function Tests: ?Recent Labs  ?Lab 09/21/21 ?1858  ?AST 35  ?ALT 23  ?ALKPHOS 75  ?BILITOT 0.9  ?PROT 7.7  ?ALBUMIN 4.0  ? ?Recent Labs  ?Lab 09/21/21 ?1858  ?LIPASE 43  ? ?No results for input(s): AMMONIA in the last 168 hours. ?Coagulation Profile: ?No results for input(s): INR, PROTIME in the last 168 hours. ?Cardiac Enzymes: ?No  results for input(s): CKTOTAL, CKMB, CKMBINDEX, TROPONINI in the last 168 hours. ?BNP (last 3 results) ?No results for input(s): PROBNP in the last 8760 hours. ?HbA1C: ?Recent Labs  ?  09/23/21 ?0503  ?HGBA1C 7.1*  ? ?CBG: ?Recent Labs  ?Lab 09/21/21 ?1830 09/22/21 ?0423 09/22/21 ?1604 09/22/21 ?2209 09/23/21 ?0800  ?GLUCAP 171* 219* 180* 145* 103*  ? ?Lipid Profile: ?No results for input(s): CHOL, HDL, LDLCALC, TRIG, CHOLHDL, LDLDIRECT in the last 72 hours. ?Thyroid Function Tests: ?Recent Labs  ?  09/22/21 ?0824  ?TSH 1.664  ? ?Anemia Panel: ?No results for input(s): VITAMINB12, FOLATE, FERRITIN, TIBC, IRON, RETICCTPCT in the last 72 hours. ?Sepsis Labs: ?No results for input(s): PROCALCITON, LATICACIDVEN in the last 168 hours. ? ?No results found for this or any previous visit (from the past 240 hour(s)).  ? ? ? ? ? ?Radiology Studies: ?CT Head Wo Contrast ? ?Result Date: 09/22/2021 ?CLINICAL DATA:  Unwitnessed fall.  Neck pain. EXAM: CT HEAD WITHOUT CONTRAST CT CERVICAL SPINE WITHOUT CONTRAST TECHNIQUE: Multidetector CT imaging of the head and cervical spine was performed following the standard protocol without intravenous contrast. Multiplanar CT image reconstructions of the cervical spine were also generated. RADIATION DOSE REDUCTION: This exam was performed according to the departmental dose-optimization program which includes automated exposure control, adjustment of the mA and/or kV according to patient size and/or use of iterative reconstruction technique. COMPARISON:  Head CT 04/23/2021. Head and cervical spine CT 12/20/2019. FINDINGS: CT HEAD FINDINGS Brain: There is no evidence for acute hemorrhage, hydrocephalus, mass lesion, or abnormal extra-axial fluid collection. No definite CT evidence for acute infarction. Diffuse loss of parenchymal volume is consistent with atrophy. Patchy low attenuation in the deep hemispheric and periventricular white matter is nonspecific, but likely reflects chronic  microvascular ischemic demyelination. Tiny lacunar infarct noted right cerebellum Vascular: No hyperdense vessel or unexpected calcification. Skull: No evidence for fracture. No worrisome lytic or sclerotic lesion. Sinuses/Orbits: Trace air-fluid level noted right sphenoid sinuses. Remaining visualized paranasal sinuses and mastoid air cells are clear. Visualized portions of the globes and intraorbital fat are unremarkable. Other: None. CT CERVICAL SPINE FINDINGS Alignment: Normal. Skull base and vertebrae: No acute fracture. No primary bone lesion or focal pathologic process. Soft tissues and spinal canal: No prevertebral fluid or swelling. No visible canal hematoma. Disc levels: Loss of disc height noted C5-6, C6-7 and C7-T1. Facets are well aligned bilaterally. Upper chest: Unremarkable. Other: None. IMPRESSION: 1. No acute intracranial abnormality. 2. Atrophy with chronic microvascular ischemic demyelination. 3. Trace air-fluid level right sphenoid sinus, stable since 12/20/2019. Findings may reflect chronic sinusitis. 4. No cervical fracture or subluxation. 5. Degenerative changes in the cervical spine as above. Electronically Signed   By: Misty Stanley M.D.   On: 09/22/2021 05:26  ? ?CT Cervical Spine Wo  Contrast ? ?Result Date: 09/22/2021 ?CLINICAL DATA:  Unwitnessed fall.  Neck pain. EXAM: CT HEAD WITHOUT CONTRAST CT CERVICAL SPINE WITHOUT CONTRAST TECHNIQUE: Multidetector CT imaging of the head and cervical spine was performed following the standard protocol without intravenous contrast. Multiplanar CT image reconstructions of the cervical spine were also generated. RADIATION DOSE REDUCTION: This exam was performed according to the departmental dose-optimization program which includes automated exposure control, adjustment of the mA and/or kV according to patient size and/or use of iterative reconstruction technique. COMPARISON:  Head CT 04/23/2021. Head and cervical spine CT 12/20/2019. FINDINGS: CT HEAD  FINDINGS Brain: There is no evidence for acute hemorrhage, hydrocephalus, mass lesion, or abnormal extra-axial fluid collection. No definite CT evidence for acute infarction. Diffuse loss of parenchymal volume is consist

## 2021-09-23 NOTE — Evaluation (Signed)
Physical Therapy Evaluation ?Patient Details ?Name: Molly Cobb ?MRN: [id number] ?DOB: [birth year] ?Today's Date: 09/23/2021 ? ?History of Present Illness ? 88 year old female with a history of CKD, CAD, diabetes, hypertension, aortic stenosis, presents with right-sided flank pain and increased urination, labs are pertinent for hyponatremia  ?Clinical Impression ? The patient is emotional, states she does not what is wrong with her. Patient  ambulated  in room with RW and multimodal cues to stay on task and for safety..Patient's  depends saturated with pungent urine.  ? Patient oriented to person and [place]. Does not recall getting to hospital. ? Patient reports multiple falls. Patient does not appear to be able to live independently at this time, uness MS improves. Family not present.    ?Pt admitted with above diagnosis.  Pt currently with functional limitations due to the deficits listed below (see PT Problem List). Pt will benefit from skilled PT to increase their independence and safety with mobility to allow discharge to the venue listed below.   ? ?  ?   ? ?Recommendations for follow up therapy are one component of a multi-disciplinary discharge planning process, led by the attending physician.  Recommendations may be updated based on patient status, additional functional criteria and insurance authorization. ? ?Follow Up Recommendations Skilled nursing-short term rehab (<3 hours/day) ? ?  ?Assistance Recommended at Discharge    ?Patient can return home with the following ? A little help with walking and/or transfers;A little help with bathing/dressing/bathroom;Direct supervision/assist for financial management;Help with stairs or ramp for entrance;Assist for transportation;Assistance with cooking/housework ? ?  ?Equipment Recommendations None recommended by PT  ?Recommendations for Other Services ?    ?  ?Functional Status Assessment Patient has had a recent decline in their functional status and demonstrates the  ability to make significant improvements in function in a reasonable and predictable amount of time.  ? ?  ?Precautions / Restrictions Precautions ?Precautions: Fall ?Precaution Comments: urinary urgency, reports multiple falls recently  ? ?  ? ?Mobility ? Bed Mobility ?Overal bed mobility: Needs Assistance ?Bed Mobility: Rolling, Sidelying to Sit ?Rolling: Min guard ?Sidelying to sit: Min guard ?  ?  ?  ?General bed mobility comments: for safety, cues to initiate activiety, self distracted with multiple  complaints and feeling "LOw" ?  ? ?Transfers ?Overall transfer level: Needs assistance ?Equipment used: Rolling walker (2 wheels) ?Transfers: Sit to/from Stand ?Sit to Stand: Min guard ?  ?  ?  ?  ?  ?General transfer comment: at [facility] and  from toilet with 1 rail. ?  ? ?Ambulation/Gait ?Ambulation/Gait assistance: Min assist ?Gait Distance (Feet): 20 Feet (x 2) ?Assistive device: Rolling walker (2 wheels) ?Gait Pattern/deviations: Step-through pattern, Step-to pattern ?Gait velocity: decr ?  ?  ?General Gait Details: slow to initiate, assist with Rw around tight spaces(i BR) for safety ? ?Stairs ?  ?  ?  ?  ?  ? ?Wheelchair Mobility ?  ? ?Modified Rankin (Stroke Patients Only) ?  ? ?  ? ?Balance Overall balance assessment: Needs assistance ?Sitting-balance support: No upper extremity supported, Feet supported ?Sitting balance-Leahy Scale: Fair ?  ?  ?Standing balance support: During functional activity, Bilateral upper extremity supported, Reliant on assistive device for balance ?Standing balance-Leahy Scale: Fair ?Standing balance comment: stood with 1 extremity ato wash after toileting. ?  ?  ?  ?  ?  ?  ?  ?  ?  ?  ?  ?   ? ? ? ?Pertinent Vitals/Pain Pain Assessment ?Pain  Assessment: Faces ?Faces Pain Scale: Hurts little more ?Pain Location: roght lower flank ?Pain Descriptors / Indicators: Discomfort ?Pain Intervention(s): Monitored during session  ? ? ?Home Living Family/patient expects to be discharged to::  Private residence ?Living Arrangements: Alone ?Available Help at Discharge: Available PRN/intermittently ?Type of Home: Apartment ?Home Access: Level entry ?  ?  ?  ?Home Layout: One level ?Home Equipment: Conservation officer, nature (2 wheels);Cane - single point;Grab bars - tub/shower ?Additional Comments: daughter assists with groceries,  ?  ?Prior Function Prior Level of Function : Needs assist ?  ?  ?  ?  ?  ?  ?Mobility Comments: uses RW or cane when needed ?  ?  ? ? ?Hand Dominance  ?   ? ?  ?Extremity/Trunk Assessment  ?   ?  ? ?Lower Extremity Assessment ?Lower Extremity Assessment: Overall WFL for tasks assessed ?  ? ?Cervical / Trunk Assessment ?Cervical / Trunk Assessment: Normal  ?Communication  ?    ?Cognition Arousal/Alertness: Awake/alert ?Behavior During Therapy: Anxious ?Overall Cognitive Status: No family/caregiver present to determine baseline cognitive functioning ?Area of Impairment: Orientation, Memory, Attention, Following commands ?  ?  ?  ?  ?  ?  ?  ?  ?Orientation Level: Place, Time, Situation ?Current Attention Level: Sustained ?Memory: Decreased recall of precautions ?Following Commands: Follows one step commands consistently ?  ?  ?  ?General Comments: perseverates on feeling down, iv pulling, can't remeber  things,  extra time and need to repeat directions ?  ?  ? ?  ?General Comments   ? ?  ?Exercises    ? ?Assessment/Plan  ?  ?PT Assessment Patient needs continued PT services  ?PT Problem List Decreased mobility;Decreased safety awareness;Decreased knowledge of precautions;Decreased activity tolerance;Decreased cognition;Decreased balance ? ?   ?  ?PT Treatment Interventions DME instruction;Therapeutic activities;Gait training;Therapeutic exercise;Patient/family education;Functional mobility training   ? ?PT Goals (Current goals can be found in the Care Plan section)  ?Acute Rehab PT Goals ?Patient Stated Goal: I want to know what is wrong with me, i juast want to cry ?PT Goal Formulation: With  patient ?Time For Goal Achievement: 10/07/21 ?Potential to Achieve Goals: Fair ? ?  ?Frequency Min 3X/week ?  ? ? ?Co-evaluation   ?  ?  ?  ?  ? ? ?  ?AM-PAC PT "6 Clicks" Mobility  ?Outcome Measure Help needed turning from your back to your side while in a flat bed without using bedrails?: None ?Help needed moving from lying on your back to sitting on the side of a flat bed without using bedrails?: A Little ?Help needed moving to and from a bed to a chair (including a wheelchair)?: A Little ?Help needed standing up from a chair using your arms (e.g., wheelchair or bedside chair)?: A Little ?Help needed to walk in hospital room?: A Lot ?Help needed climbing 3-5 steps with a railing? : A Lot ?6 Click Score: 17 ? ?  ?End of Session Equipment Utilized During Treatment: Gait belt ?Activity Tolerance: Patient tolerated treatment well ?Patient left: in chair;with call bell/phone within reach;with chair alarm set ?Nurse Communication: Mobility status ?PT Visit Diagnosis: Unsteadiness on feet (R26.81);History of falling (Z91.81);Pain ?  ? ?Time: 2947-6546 ?PT Time Calculation (min) (ACUTE ONLY): 28 min ? ? ?Charges:   PT Evaluation ?$PT Eval Low Complexity: 1 Low ?PT Treatments ?$Gait Training: 8-22 mins ?  ?   ? ? ?Tresa Endo PT ?Acute Rehabilitation Services ?Pager [phone] ?Office [phone] ? ? ?Claretha Cooper ?09/23/2021,  10:20 AM ? ?

## 2021-09-23 NOTE — Evaluation (Signed)
Occupational Therapy Evaluation ?Patient Details ?Name: Molly Cobb ?MRN: [id number] ?DOB: 02-04-1932 ?Today's Date: 09/23/2021 ? ? ?History of Present Illness 88 year old female with a history of CKD, CAD, diabetes, hypertension, aortic stenosis, presents with right-sided flank pain and increased urination, labs are pertinent for hyponatremia  ? ?Clinical Impression ?  ?Patient is a 88 year old female who lives at home alone with some family support. Patient was noted to have decreased safety awareness, decreased activity tolerance, decreased endurance, decreased standing balance impacting participation in ADLs. Patient would benefit from 24/7 caregiver support in next level of care to be successful. Patient would continue to benefit from skilled OT services at this time while admitted and after d/c to address noted deficits in order to improve overall safety and independence in ADLs.  ?  ?   ? ?Recommendations for follow up therapy are one component of a multi-disciplinary discharge planning process, led by the attending physician.  Recommendations may be updated based on patient status, additional functional criteria and insurance authorization.  ? ?Follow Up Recommendations ? Skilled nursing-short term rehab (<3 hours/day)  ?  ?Assistance Recommended at Discharge Frequent or constant Supervision/Assistance  ?Patient can return home with the following A little help with walking and/or transfers;Assistance with cooking/housework;Direct supervision/assist for medications management;Assist for transportation;Help with stairs or ramp for entrance;Direct supervision/assist for financial management;A little help with bathing/dressing/bathroom ? ?  ?Functional Status Assessment ? Patient has had a recent decline in their functional status and demonstrates the ability to make significant improvements in function in a reasonable and predictable amount of time.  ?Equipment Recommendations ? None recommended by OT  ?   ?Recommendations for Other Services   ? ? ?  ?Precautions / Restrictions Precautions ?Precautions: Fall ?Precaution Comments: urinary urgency, reports multiple falls recently ?Restrictions ?Weight Bearing Restrictions: No  ? ?  ? ?Mobility Bed Mobility ?  ?  ?  ?  ?  ?  ?  ?General bed mobility comments: patient was up in recliner and returned to the same at the end of session ?  ? ?Transfers ?  ?  ?  ?  ?  ?  ?  ?  ?  ?  ?  ? ?  ?Balance Overall balance assessment: Needs assistance ?Sitting-balance support: No upper extremity supported, Feet supported ?Sitting balance-Leahy Scale: Fair ?  ?  ?Standing balance support: During functional activity, Bilateral upper extremity supported, Reliant on assistive device for balance ?Standing balance-Leahy Scale: Fair ?  ?  ?  ?  ?  ?  ?  ?  ?  ?  ?  ?  ?   ? ?ADL either performed or assessed with clinical judgement  ? ?ADL Overall ADL's : Needs assistance/impaired ?Eating/Feeding: Set up;Sitting ?  ?Grooming: Wash/dry face;Oral care;Standing;Min guard ?Grooming Details (indicate cue type and reason): cues for placment of RW ?Upper Body Bathing: Minimal assistance;Sitting ?  ?Lower Body Bathing: Minimal assistance;Sit to/from stand;Sitting/lateral leans;Cueing for safety ?  ?Upper Body Dressing : Minimal assistance;Sitting ?  ?Lower Body Dressing: Minimal assistance;Sit to/from stand;Sitting/lateral leans ?  ?Toilet Transfer: Minimal assistance;Rollator (4 wheels);Ambulation;Cueing for safety ?  ?Toileting- Clothing Manipulation and Hygiene: Minimal assistance;Sit to/from stand ?  ?  ?  ?  ?   ? ? ? ?Vision Patient Visual Report: No change from baseline ?Vision Assessment?: No apparent visual deficits ?Additional Comments: patient reported a change a while back but the MD stopped her from driving. patient unsure over what visual deficit caused it.  ?   ?  Perception   ?  ?Praxis   ?  ? ?Pertinent Vitals/Pain Pain Assessment ?Pain Assessment: Faces ?Faces Pain Scale: Hurts  little more ?Pain Location: R side lower flank ?Pain Descriptors / Indicators: Discomfort ?Pain Intervention(s): Monitored during session  ? ? ? ?Hand Dominance Right ?  ?Extremity/Trunk Assessment Upper Extremity Assessment ?Upper Extremity Assessment: Overall WFL for tasks assessed ?  ?Lower Extremity Assessment ?Lower Extremity Assessment: Defer to PT evaluation ?  ?Cervical / Trunk Assessment ?Cervical / Trunk Assessment: Normal ?  ?Communication Communication ?Communication: No difficulties ?  ?Cognition Arousal/Alertness: Awake/alert ?Behavior During Therapy: Anxious ?Overall Cognitive Status: No family/caregiver present to determine baseline cognitive functioning ?Area of Impairment: Orientation, Memory, Attention, Following commands ?  ?  ?  ?  ?  ?  ?  ?  ?Orientation Level: Place, Time, Situation ?Current Attention Level: Sustained ?Memory: Decreased recall of precautions ?Following Commands: Follows one step commands consistently ?  ?  ?  ?General Comments: patient perseverating on feeling down. patient needed increased cues for safety. patient reported having an imaginary husband at home that "lives in the closet" something she tells herself to feel better. patient did report it was a joke later in conversation. nurse made aware. ?  ?  ?General Comments    ? ?  ?Exercises   ?  ?Shoulder Instructions    ? ? ?Home Living Family/patient expects to be discharged to:: Private residence ?Living Arrangements: Alone ?Available Help at Discharge: Available PRN/intermittently ?Type of Home: Apartment ?Home Access: Level entry ?  ?  ?Home Layout: One level ?  ?  ?Bathroom Shower/Tub: Tub/shower unit ?  ?  ?  ?  ?Home Equipment: Conservation officer, nature (2 wheels);Cane - single point;Grab bars - tub/shower ?  ?Additional Comments: daughter assists with groceries, ?  ? ?  ?Prior Functioning/Environment Prior Level of Function : Needs assist ?  ?  ?  ?  ?  ?  ?Mobility Comments: uses RW or cane when needed. ?ADLs Comments:  patient reported being independent in ADLs at home. patient reported having difficulties with medication management tasks at home. reported that she lost her "hydro something" and only took them once. ?  ? ?  ?  ?OT Problem List: Decreased activity tolerance;Decreased knowledge of use of DME or AE;Decreased safety awareness;Decreased knowledge of precautions ?  ?   ?OT Treatment/Interventions: Self-care/ADL training;Therapeutic exercise;Neuromuscular education;Energy conservation;DME and/or AE instruction;Therapeutic activities;Balance training;Patient/family education  ?  ?OT Goals(Current goals can be found in the care plan section) Acute Rehab OT Goals ?Patient Stated Goal: to feel better ?OT Goal Formulation: With patient ?Time For Goal Achievement: 10/07/21 ?Potential to Achieve Goals: Fair  ?OT Frequency: Min 2X/week ?  ? ?Co-evaluation   ?  ?  ?  ?  ? ?  ?AM-PAC OT "6 Clicks" Daily Activity     ?Outcome Measure Help from another person eating meals?: A Little ?Help from another person taking care of personal grooming?: A Little ?Help from another person toileting, which includes using toliet, bedpan, or urinal?: A Little ?Help from another person bathing (including washing, rinsing, drying)?: A Lot ?Help from another person to put on and taking off regular upper body clothing?: A Little ?Help from another person to put on and taking off regular lower body clothing?: A Little ?6 Click Score: 17 ?  ?End of Session Equipment Utilized During Treatment: Gait belt;Rolling walker (2 wheels) ?Nurse Communication: Other (comment) (reports of wanting to speak with someone) ? ?Activity Tolerance: Patient tolerated treatment well ?  Patient left: in chair;with call bell/phone within reach;with chair alarm set ? ?OT Visit Diagnosis: Unsteadiness on feet (R26.81);Pain  ?              ?Time: 3614-4315 ?OT Time Calculation (min): 18 min ?Charges:  OT General Charges ?$OT Visit: 1 Visit ?OT Evaluation ?$OT Eval Moderate  Complexity: 1 Mod ? ?Tristan Proto OTR/L, MS ?Acute Rehabilitation Department ?Office# [phone] ?Pager# [phone] ? ? ?Feliz Beam Quron Ruddy ?09/23/2021, 11:59 AM ?

## 2021-09-24 LAB — GLUCOSE, CAPILLARY
Glucose-Capillary: 107 mg/dL — ABNORMAL HIGH (ref 70–99)
Glucose-Capillary: 209 mg/dL — ABNORMAL HIGH (ref 70–99)

## 2021-09-24 LAB — BASIC METABOLIC PANEL
Anion gap: 7 (ref 5–15)
BUN: 48 mg/dL — ABNORMAL HIGH (ref 8–23)
CO2: 20 mmol/L — ABNORMAL LOW (ref 22–32)
Calcium: 8.4 mg/dL — ABNORMAL LOW (ref 8.9–10.3)
Chloride: 105 mmol/L (ref 98–111)
Creatinine, Ser: 2.32 mg/dL — ABNORMAL HIGH (ref 0.44–1.00)
GFR, Estimated: 20 mL/min — ABNORMAL LOW (ref >60)
Glucose, Bld: 115 mg/dL — ABNORMAL HIGH (ref 70–99)
Potassium: 4.1 mmol/L (ref 3.5–5.1)
Sodium: 132 mmol/L — ABNORMAL LOW (ref 135–145)

## 2021-09-24 LAB — MAGNESIUM: Magnesium: 1.6 mg/dL — ABNORMAL LOW (ref 1.7–2.4)

## 2021-09-24 MED ORDER — MAGNESIUM SULFATE 4 GM/100ML IV SOLN
4.0000 g | Freq: Once | INTRAVENOUS | Status: AC
  Administered 2021-09-24: 4 g via INTRAVENOUS
  Filled 2021-09-24: qty 100

## 2021-09-24 NOTE — TOC Progression Note (Addendum)
Transition of Care (TOC) - Progression Note  ? ? ?Patient Details  ?Name: Molly Cobb ?MRN: [id number] ?Date of Birth: 31-Jan-1932 ? ?Transition of Care (TOC) CM/SW Contact  ?Delrick Dehart, Marjie Skiff, RN ?Phone Number: ?09/24/2021, 11:01 AM ? ?Clinical Narrative:    ?Spoke with pt daughter Sharyn Lull to offer SNF bed choice. [facility] chosen. [facility] liaison alerted of bed acceptance. Insurance auth started with [employer] [id number]. TOC will follow. ? ?1245 Addendum. Insurance auth for SNF approved by [employer] 3/30-4/3 [id number]. DC summary sent to [facility]. ? ?1451 Addendum: Pt to dc to [facility] room 312. She will go via [facility]. RN to call report to [phone]. ? ? ?Expected Discharge Plan: [place] ?Barriers to Discharge: Continued Medical Work up ? ?Expected Discharge Plan and Services ?Expected Discharge Plan: [place] ?  ?Discharge Planning Services: CM Consult ?  ?Living arrangements for the past 2 months: Apartment ?Expected Discharge Date: 09/24/21               ?  ?  ?Readmission Risk Interventions ?   ? View : No data to display.  ?  ?  ?  ? ? ?

## 2021-09-24 NOTE — Discharge Summary (Signed)
Physician Discharge Summary  ?Molly Cobb ZHG:[id number] DOB: [birth year] DOA: 09/21/2021 ? ?PCP: Jonathon Bellows, PA-C ? ?Admit date: 09/21/2021 ?Discharge date: 09/24/2021 ? ?Admitted From: Home ?Disposition: SNF ? ?Recommendations for Outpatient Follow-up:  ?Follow up with PCP in 1-2 weeks ?Please obtain BMP/CBC in one week your next doctors visit.  ? ? ? ?Discharge Condition: Stable ?CODE STATUS: Full code ?Diet recommendation: Heart healthy ? ?Brief/Interim Summary: ?88 y.o. female with medical history significant of CKD, CAD, depression, type II DM, gout, hypertension, hypothyroidism, severe aortic stenosis with history of TAVR, history of pacemaker placement, hypothyroidism who  ? to the emergency department due to frequent urination and lower abdominal pain for the past week.  Upon admission patient was noted to be hyponatremic requiring IV fluids which improved her sodium levels 132.  Due to frequent falls and unsteady gait at home she had CT of the head and cervical spine which were unremarkable.  PT OT recommended SNF therefore arrangements were made. ? ?Daughter and patient agreeable for SNF. ? ?PT/OT evaluation performed.  SNF recommended.  SNF appropriate as the patient has received 3 days of hospital care (or the 3 days stay Has been made by the patient's insurance company) and is felt to need rehab services to restore this patient to their prior level of function to achieve safe transition back to home care.  This patient needs rehab services for at least 5 days/week and skilled nursing services daily to facilitate this transition.  Rehab is been requested as the most appropriate discharge option for this patient and is not felt to be custodial care as evidenced by prior independent level of functioning.   ? ? ? Principal Problem: ?  Hyponatremia ?Active Problems: ?  Benign hypertension with CKD (chronic kidney disease) stage IV (HCC) ?  Hypothyroidism (acquired) ?  Type 2 diabetes mellitus with  stage 4 chronic kidney disease, without long-term current use of insulin ([place]) ?  Anemia in chronic renal disease ?  Frequency of micturition ?  ?  Hyponatremia ?-This is resolved with IV fluids.  Sodium levels today are 132.  Continue to monitor this after she goes home as well periodically. ?  ?Weakness with freq falls at home ?-CT head and C spine neg.  ?TSH, am cortisol Normal ?  ? Benign hypertension with  ?CKD (chronic kidney disease) stage IV ([place]) ?-Resume her home regimen of Norvasc, isosorbide mononitrate, sodium bicarb daily. ?  ?  Hypothyroidism (acquired) ?Continue levothyroxine 88 mcg p.o. daily. ?  ?  Type 2 diabetes mellitus ? without long-term current use of insulin ([place]) ?Carbohydrate modified/renal diet. A1c 7.1 ?Continue Tradjenta 5 mg p.o. daily. ?  ?  Anemia in chronic renal disease ?Hb stable.  ?  ?  Frequency of micturition ?UA - neg ?  ? PT/OT - rec SNF.  ?  ?Assessment and Plan: ?No notes have been filed under this hospital service. ?Service: Hospitalist ? ? ? ?  ?Body mass index is 33.66 kg/m?. ? ?  ? ? ? ?Discharge Diagnoses:  ?Principal Problem: ?  Hyponatremia ?Active Problems: ?  Benign hypertension with CKD (chronic kidney disease) stage IV (HCC) ?  Hypothyroidism (acquired) ?  Type 2 diabetes mellitus with stage 4 chronic kidney disease, without long-term current use of insulin ([place]) ?  Anemia in chronic renal disease ?  Frequency of micturition ? ? ? ? ? ?Consultations: ?None ? ?Subjective: ?Feels well, no acute events overnight.  Agreeable for SNF. ? ?Discharge Exam: ?Vitals:  ?  09/24/21 0700 09/24/21 0908  ?BP:  (!) 155/66  ?Pulse:  73  ?Resp: 18   ?Temp:  97.6 ?F (36.4 ?C)  ?SpO2:  99%  ? ?Vitals:  ? 09/24/21 0500 09/24/21 0600 09/24/21 0700 09/24/21 0908  ?BP:    (!) 155/66  ?Pulse:    73  ?Resp: 15 18 18    ?Temp:    97.6 ?F (36.4 ?C)  ?TempSrc:    Oral  ?SpO2:    99%  ?Weight:      ?Height:      ? ? ?General: Pt is alert, awake, not in acute distress ?Cardiovascular: RRR, S1/S2  +, no rubs, no gallops ?Respiratory: CTA bilaterally, no wheezing, no rhonchi ?Abdominal: Soft, NT, ND, bowel sounds + ?Extremities: no edema, no cyanosis ? ?Discharge Instructions ? ? ?Allergies as of 09/24/2021   ? ?   Reactions  ? Oxybutynin Other (See Comments)  ? Caused hallucinations  ? Oysters [shellfish Allergy] Nausea And Vomiting  ? Sulfa Antibiotics Nausea And Vomiting  ? ?  ? ?  ?Medication List  ?  ? ?TAKE these medications   ? ?amLODipine 10 MG tablet ?Commonly known as: NORVASC ?Take 10 mg by mouth every morning. ?  ?aspirin 81 MG chewable tablet ?Chew 1 tablet (81 mg total) by mouth daily. ?What changed: when to take this ?  ?atorvastatin 40 MG tablet ?Commonly known as: LIPITOR ?Take 40 mg by mouth every morning. ?  ?CALCIUM PO ?Take 1 tablet by mouth every morning. ?  ?citalopram 10 MG tablet ?Commonly known as: CELEXA ?Take 1 tablet by mouth daily. ?  ?conjugated estrogens vaginal cream ?Commonly known as: PREMARIN ?Place 1 applicator vaginally every Monday, Wednesday, and Friday. ?  ?COQ-10 PO ?Take 1 capsule by mouth every morning. ?  ?escitalopram 10 MG tablet ?Commonly known as: LEXAPRO ?Take 10 mg by mouth every morning. ?  ?ezetimibe 10 MG tablet ?Commonly known as: ZETIA ?Take 10 mg by mouth every morning. ?  ?glipiZIDE XL 5 MG 24 hr tablet ?Generic drug: glipiZIDE ?Take 5 mg by mouth every morning. ?  ?hydrOXYzine 10 MG tablet ?Commonly known as: ATARAX ?Take 10 mg by mouth 2 (two) times daily as needed for anxiety. ?  ?isosorbide mononitrate 30 MG 24 hr tablet ?Commonly known as: IMDUR ?Take 30 mg by mouth every morning. ?  ?levothyroxine 88 MCG tablet ?Commonly known as: SYNTHROID ?Take 88 mcg by mouth daily before breakfast. ?What changed: Another medication with the same name was removed. Continue taking this medication, and follow the directions you see here. ?  ?lidocaine 5 % ?Commonly known as: LIDODERM ?Place 1 patch onto the skin daily as needed (pain). Remove & Discard patch  within 12 hours or as directed by MD ?  ?metoprolol tartrate 25 MG tablet ?Commonly known as: LOPRESSOR ?TAKE 1/2 (ONE-HALF) TABLET BY MOUTH TWICE DAILY ?What changed: See the new instructions. ?  ?nitroGLYCERIN 0.4 MG SL tablet ?Commonly known as: Nitrostat ?Place 1 tablet (0.4 mg total) under the tongue every 5 (five) minutes as needed for chest pain. ?  ?pantoprazole 40 MG tablet ?Commonly known as: PROTONIX ?Take 1 tablet (40 mg total) by mouth daily. ?What changed: when to take this ?  ?PREVAGEN PO ?Take 1 capsule by mouth every morning. ?  ?sodium bicarbonate 650 MG tablet ?Take 650 mg by mouth every morning. ?  ?Tradjenta 5 MG Tabs tablet ?Generic drug: linagliptin ?Take 5 mg by mouth every morning. ?  ?VITAMIN B COMPLEX PO ?Take 1  capsule by mouth every morning. ?  ?VITAMIN C PO ?Take 1 tablet by mouth every morning. ?  ?VITAMIN D PO ?Take 1 tablet by mouth See admin instructions. Take 1 tablet by mouth every other night ?  ? ?  ? ? Follow-up Information   ? ? Jonathon Bellows, PA-C Follow up in 1 week(s).   ?Specialty: Physician Assistant ?Contact information: ?[address] ?[address] ?[place] [address] ?[phone] ? ? ?  ?  ? ?  ?  ? ?  ? ?Allergies  ?Allergen Reactions  ? Oxybutynin Other (See Comments)  ?  Caused hallucinations  ? Oysters [Shellfish Allergy] Nausea And Vomiting  ? Sulfa Antibiotics Nausea And Vomiting  ? ? ?You were cared for by a hospitalist during your hospital stay. If you have any questions about your discharge medications or the care you received while you were in the hospital after you are discharged, you can call the unit and asked to speak with the hospitalist on call if the hospitalist that took care of you is not available. Once you are discharged, your primary care physician will handle any further medical issues. Please note that no refills for any discharge medications will be authorized once you are discharged, as it is imperative that you return to your  primary care physician (or establish a relationship with a primary care physician if you do not have one) for your aftercare needs so that they can reassess your need for medications and monitor your lab value

## 2021-09-24 NOTE — Progress Notes (Signed)
Patient discharged to [facility] rehab, via [place], report called to [place].

## 2021-09-25 ENCOUNTER — Encounter: Payer: Self-pay | Admitting: Adult Health

## 2021-09-25 ENCOUNTER — Non-Acute Institutional Stay (SKILLED_NURSING_FACILITY): Payer: Medicare Other | Admitting: Adult Health

## 2021-09-25 DIAGNOSIS — E1122 Type 2 diabetes mellitus with diabetic chronic kidney disease: Secondary | ICD-10-CM

## 2021-09-25 DIAGNOSIS — E871 Hypo-osmolality and hyponatremia: Secondary | ICD-10-CM

## 2021-09-25 DIAGNOSIS — R296 Repeated falls: Secondary | ICD-10-CM

## 2021-09-25 DIAGNOSIS — I129 Hypertensive chronic kidney disease with stage 1 through stage 4 chronic kidney disease, or unspecified chronic kidney disease: Secondary | ICD-10-CM | POA: Diagnosis not present

## 2021-09-25 DIAGNOSIS — N184 Chronic kidney disease, stage 4 (severe): Secondary | ICD-10-CM

## 2021-09-25 DIAGNOSIS — F339 Major depressive disorder, recurrent, unspecified: Secondary | ICD-10-CM

## 2021-09-25 DIAGNOSIS — E039 Hypothyroidism, unspecified: Secondary | ICD-10-CM

## 2021-09-25 DIAGNOSIS — K5901 Slow transit constipation: Secondary | ICD-10-CM

## 2021-09-25 DIAGNOSIS — I350 Nonrheumatic aortic (valve) stenosis: Secondary | ICD-10-CM

## 2021-09-25 NOTE — Progress Notes (Signed)
? ?Location:  [facility] ?Nursing Home Room Number: 016-W ?Place of Service:  SNF (31) ?Provider:  Durenda Age, DNP, FNP-BC ? ?Patient Care Team: ?Jonathon Bellows, PA-C as PCP - General (Physician Assistant) ? ?Extended Emergency Contact Information ?Primary Emergency Contact: Campbell,Michelle ? [place] ?Mobile Phone: [phone] ?Relation: Daughter ? ?Code Status:  Full Code ? ?Goals of care: Advanced Directive information ? ?  09/25/2021  ? 10:23 AM  ?Advanced Directives  ?Does Patient Have a Medical Advance Directive? No  ?Does patient want to make changes to medical advance directive? No - Patient declined  ? ? ? ?Chief Complaint  ?Patient presents with  ? Hospitalization Follow-up  ? ? ?HPI:  ?Pt is a 88 y.o. female who was admitted to [facility] on 09/24/21 post hospital admission 09/21/21 to 09/24/21. She has a PMH of CKD, CAD, depression, type 2 diabetes mellitus, gout, hypertension, severe aortic stenosis with history of pacemaker placement and hypothyroidism. She presented to the ED due to frequent urination and lower abdominal pain a week prior to admission. Labs showed Na 125 for which she was given IV fluids. Na improved to 132. She was having frequent falls and unsteady gait at home. CT of the head and cervical spine were unremarkable. She was recommended by PT and OT for short-term rehabilitation. ? ?She was seen in the room today. She was noted to have tenderness on her RUQ and constipation.  No bruising was noted in the area. She lives at home by herself and has a daughter who lives in [place]. She stopped driving last 2/20 due to her poor vision.  ? ? ?Past Medical History:  ?Diagnosis Date  ? CKD (chronic kidney disease)   ? a. followed by Dr. Neta Ehlers at [facility]   ? Coronary artery disease   ? 02/23/17 PCI/DES to mRCA,  normal EF  ? Depression   ? Diabetes mellitus ([place])   ? Gout   ? Hypertension   ? Hypothyroidism   ? Presence  of permanent cardiac pacemaker   ? a. followed by [facility]  ? S/P TAVR (transcatheter aortic valve replacement) 04/12/2017  ? 23 mm Edwards Sapien 3 transcatheter heart valve placed via percutaneous right transfemoral approach   ? Severe aortic stenosis   ? a. diagnosed 01/2017 during admission for chest pain.   ? Thyroid disease   ? ?Past Surgical History:  ?Procedure Laterality Date  ? APPENDECTOMY    ? CESAREAN SECTION    ? CORONARY ATHERECTOMY N/A 02/23/2017  ? Procedure: CORONARY ATHERECTOMY;  Surgeon: [name], MD;  Location: [place] [facility];  Service: Cardiovascular;  Laterality: N/A;  ? ESOPHAGOGASTRODUODENOSCOPY N/A 02/18/2017  ? Procedure: ESOPHAGOGASTRODUODENOSCOPY (EGD);  Surgeon: Milus Banister, MD;  Location: [facility] ENDOSCOPY;  Service: Endoscopy;  Laterality: N/A;  ? EYE SURGERY    ? FEMUR FRACTURE SURGERY    ? HOT HEMOSTASIS N/A 02/18/2017  ? Procedure: HOT HEMOSTASIS (ARGON PLASMA COAGULATION/BICAP);  Surgeon: Milus Banister, MD;  Location: [facility] ENDOSCOPY;  Service: Endoscopy;  Laterality: N/A;  ? IR RADIOLOGY PERIPHERAL GUIDED IV START  03/07/2017  ? IR US GUIDE VASC ACCESS RIGHT  03/07/2017  ? RIGHT/LEFT HEART CATH AND CORONARY ANGIOGRAPHY N/A 02/16/2017  ? Procedure: RIGHT/LEFT HEART CATH AND CORONARY ANGIOGRAPHY;  Surgeon: Wellington Hampshire, MD;  Location: [place] [facility];  Service: Cardiovascular;  Laterality: N/A;  ? ROTATOR CUFF REPAIR    ? TEE WITHOUT CARDIOVERSION N/A 04/12/2017  ? Procedure: TRANSESOPHAGEAL  ECHOCARDIOGRAM (TEE);  Surgeon: [name], MD;  Location: [place];  Service: Open Heart Surgery;  Laterality: N/A;  ? THYROID SURGERY    ? TRANSCATHETER AORTIC VALVE REPLACEMENT, TRANSFEMORAL N/A 04/12/2017  ? Procedure: TRANSCATHETER AORTIC VALVE REPLACEMENT, TRANSFEMORAL;  Surgeon: [name], MD;  Location: [place];  Service: Open Heart Surgery;  Laterality: N/A;  ? ? ?Allergies  ?Allergen Reactions  ? Oxybutynin Other (See Comments)  ?  Caused hallucinations  ? Oysters  [Shellfish Allergy] Nausea And Vomiting  ? Sulfa Antibiotics Nausea And Vomiting  ? ? ?Outpatient Encounter Medications as of 09/25/2021  ?Medication Sig  ? amLODipine (NORVASC) 10 MG tablet Take 10 mg by mouth every morning.  ? Apoaequorin (PREVAGEN PO) Take 1 capsule by mouth every morning.  ? Ascorbic Acid (VITAMIN C PO) Take 1 tablet by mouth every morning.  ? aspirin 81 MG chewable tablet Chew 1 tablet (81 mg total) by mouth daily.  ? atorvastatin (LIPITOR) 40 MG tablet Take 40 mg by mouth every morning.  ? B Complex Vitamins (VITAMIN B COMPLEX PO) Take 1 capsule by mouth every morning.  ? bisacodyl (DULCOLAX) 10 MG suppository If not relieved by MOM, give 10 mg Bisacodyl suppositiory rectally X 1 dose in 24 hours as needed  ? Coenzyme Q10 (COQ-10 PO) Take 1 capsule by mouth every morning.  ? conjugated estrogens (PREMARIN) vaginal cream Place 1 applicator vaginally every Monday, Wednesday, and Friday.  ? escitalopram (LEXAPRO) 10 MG tablet Take 10 mg by mouth every morning.  ? ezetimibe (ZETIA) 10 MG tablet Take 10 mg by mouth every morning.  ? hydrOXYzine (ATARAX) 10 MG tablet Take 10 mg by mouth 2 (two) times daily as needed for anxiety.  ? isosorbide mononitrate (IMDUR) 30 MG 24 hr tablet Take 30 mg by mouth every morning.  ? levothyroxine (SYNTHROID) 88 MCG tablet Take 88 mcg by mouth daily before breakfast.  ? lidocaine (LIDODERM) 5 % Place 1 patch onto the skin daily as needed (pain). Remove & Discard patch within 12 hours or as directed by MD  ? Magnesium Hydroxide (MILK OF MAGNESIA PO) Take by mouth. If no BM in 3 days, give 30 cc Milk of Magnesium p.o. x 1 dose in 24 hours as needed  ? metoprolol tartrate (LOPRESSOR) 25 MG tablet TAKE 1/2 (ONE-HALF) TABLET BY MOUTH TWICE DAILY  ? nitroGLYCERIN (NITROSTAT) 0.4 MG SL tablet Place 1 tablet (0.4 mg total) under the tongue every 5 (five) minutes as needed for chest pain.  ? NON FORMULARY Diet:Heart Healthy/CCD/1200 fluid restriction  ? OYSTER SHELL CALCIUM  PO Take by mouth. 500 MG TB; TAKE 1 TABLET BY MOUTH ONCE DAILY FOR SUPPLEMENT  ? pantoprazole (PROTONIX) 40 MG tablet Take 1 tablet (40 mg total) by mouth daily.  ? sodium bicarbonate 650 MG tablet Take 650 mg by mouth every morning.  ? Sodium Phosphates (RA SALINE ENEMA RE) If not relieved by Biscodyl suppository, give disposable Saline Enema rectally X 1 dose/24 hrs as needed  ? TRADJENTA 5 MG TABS tablet Take 5 mg by mouth every morning.  ? VITAMIN D PO Take 1 tablet by mouth See admin instructions. Take 1 tablet by mouth every other night  ? citalopram (CELEXA) 10 MG tablet Take 1 tablet by mouth daily. (Patient not taking: Reported on 09/22/2021)  ? GLIPIZIDE XL 5 MG 24 hr tablet Take 5 mg by mouth every morning.  ? [DISCONTINUED] CALCIUM PO Take 1 tablet by mouth every morning.  ? ?No facility-administered encounter  medications on file as of 09/25/2021.  ? ? ?Review of Systems  ?Constitutional:  Negative for appetite change, chills, fatigue and fever.  ?HENT:  Negative for congestion, hearing loss, rhinorrhea and sore throat.   ?Eyes: Negative.   ?Respiratory:  Negative for cough, shortness of breath and wheezing.   ?Cardiovascular:  Negative for chest pain, palpitations and leg swelling.  ?Gastrointestinal:  Positive for abdominal pain and constipation. Negative for abdominal distention, diarrhea, nausea and vomiting.  ?Genitourinary:  Negative for dysuria.  ?Musculoskeletal:  Negative for arthralgias, back pain and myalgias.  ?Skin:  Negative for color change, rash and wound.  ?Neurological:  Negative for dizziness, weakness and headaches.  ?Psychiatric/Behavioral:  Negative for behavioral problems. The patient is not nervous/anxious.    ? ? ? ?Immunization History  ?Administered Date(s) Administered  ? Influenza, High Dose Seasonal PF 08/14/2020, 04/30/2021  ? PFIZER Comirnaty(Gray Top)Covid-19 Tri-Sucrose Vaccine 08/04/2019, 08/25/2019, 05/05/2020, 10/18/2020  ? Pneumococcal Polysaccharide-23 01/28/2020   ? ?Pertinent  Health Maintenance Due  ?Topic Date Due  ? FOOT EXAM  Never done  ? OPHTHALMOLOGY EXAM  Never done  ? URINE MICROALBUMIN  Never done  ? DEXA SCAN  Never done  ? HEMOGLOBIN A1C  03/26/2022  ?

## 2021-09-28 ENCOUNTER — Non-Acute Institutional Stay (SKILLED_NURSING_FACILITY): Payer: Medicare Other | Admitting: Internal Medicine

## 2021-09-28 ENCOUNTER — Encounter: Payer: Self-pay | Admitting: Internal Medicine

## 2021-09-28 DIAGNOSIS — E039 Hypothyroidism, unspecified: Secondary | ICD-10-CM

## 2021-09-28 DIAGNOSIS — R627 Adult failure to thrive: Secondary | ICD-10-CM

## 2021-09-28 DIAGNOSIS — E871 Hypo-osmolality and hyponatremia: Secondary | ICD-10-CM

## 2021-09-28 DIAGNOSIS — E1122 Type 2 diabetes mellitus with diabetic chronic kidney disease: Secondary | ICD-10-CM | POA: Diagnosis not present

## 2021-09-28 DIAGNOSIS — N184 Chronic kidney disease, stage 4 (severe): Secondary | ICD-10-CM | POA: Diagnosis not present

## 2021-09-28 DIAGNOSIS — D631 Anemia in chronic kidney disease: Secondary | ICD-10-CM

## 2021-09-28 NOTE — Assessment & Plan Note (Addendum)
Glucoses while hospitalized for recurrent falls 103-219.  A1c 7.1 indicating adequate control based on age and multiple comorbidities.  Critical is to avoid hypoglycemia. ?

## 2021-09-28 NOTE — Progress Notes (Signed)
? ?NURSING HOME LOCATION:  [place] ?ROOM NUMBER:  [id number] ? ?CODE STATUS: Full code ? ?PCP: Jossie Ng, PA-C ? ?This is a comprehensive admission note to this SNFperformed on this date less than 30 days from date of admission. ?Included are preadmission medical/surgical history; reconciled medication list; family history; social history and comprehensive review of systems.  ?Corrections and additions to the records were documented. Comprehensive physical exam was also performed. Additionally a clinical summary was entered for each active diagnosis pertinent to this admission in the Problem List to enhance continuity of care. ? ?HPI: Patient was hospitalized 3/27 - 09/24/2021 presenting to the ED with frequent urination and lower abdominal pain present for 1 week.  Urinalysis was negative.  CT renal scan study revealed previously documented adrenal adenomas as well as small calcific deposits in the kidneys; nephrolithiasis could not be ruled ou, but there was no hydronephrosis. Creatinine was 2.30 with a GFR of  19 documenting ESRD. ?Hyponatremia was present with a value of 125, necessitating IV fluid replacement with stabilization of the sodium level at 132. ?Presentation was in the context of history of frequent falls and unsteady gait at home.  CT imaging of the head and cervical spine were unremarkable.  Cortisol and TSH were normal or therapeutic. ?Tradjenta 5 mg daily was continued for her diabetes; A1c was 7.1%.  The ESRD was associated with anemia which was stable. ?Because of the recurrent falls; PT/OT recommended SNF placement for rehab. ? ?Past medical and surgical history: Includes diabetes with CKD, CAD, history of gout, essential hypertension, hypothyroidism, chronic anemia and depression. ?Surgeries and procedures include vascular access procedures, thyroid surgery, transcatheter aortic valve replacement, coronary atherectomy, and rotator cuff repair. ? ?Social history:  Nondrinker; non-smoker. ? ?Family history: Noncontributory due to advanced age. ?  ?Review of systems: She knew that she had been hospitalized with "low sodium".  She described nocturia 4-5 times per night.  She stated that if she would ingest even a half a glass of water she would have to go to the bathroom within an hour.  She stated that at home she was "staggery with poor balance".  She stated if she ambulated quickly and stopped she would stumble. ?She states that she cannot taste or smell since she had COVID remotely.  She has an occasional nonproductive cough. ?She describes tenderness to palpation in the right lower quadrant as well as "sharp, quick" pain which occasionally radiates to her back. ?She describes easy bruising. ?She has constipation and stool is dark and hard. ?She has intermittent numbness of her hands. ?She states she checks her glucoses but could not give me the number.  She states that "once or twice it is low and once or twice its high".  She states that she is on medicines for anxiety and depression.  She stated that she was told it was unsafe for her to drive apparently due to vision issues.  She does not know the diagnosis. ?She apparently has cold intolerance as she keeps her apartment "warm".  Her room ambient temperature at this time was 83 degrees. ? ?Constitutional: No fever, significant weight change  ?Eyes: No redness, discharge, pain,new vision change ?ENT/mouth: No nasal congestion, purulent discharge, earache, change in hearing, sore throat  ?Cardiovascular: No chest pain, palpitations, paroxysmal nocturnal dyspnea, claudication, edema  ?Respiratory: No  sputum production, hemoptysis, DOE, significant snoring, apnea Gastrointestinal: No heartburn, dysphagia,  nausea /vomiting, rectal bleeding, melena ?Genitourinary: No dysuria, hematuria, pyuria ?Musculoskeletal: No joint stiffness,  joint swelling ?Dermatologic: No rash, pruritus, change in appearance of skin ?Neurologic: No  headache, syncope, seizures ?Psychiatric: No significant insomnia ?Endocrine: No change in hair/skin/nails, excessive thirst, excessive hunger  ?Hematologic/lymphatic: No significant  lymphadenopathy, abnormal bleeding ?Allergy/immunology: No itchy/watery eyes, significant sneezing, urticaria, angioedema ? ?Physical exam:  ?Pertinent or positive findings: She is morbidly obese.  Eyebrows are decreased in density laterally.  There appears to be some fine hirsutism of the upper lip.  She has complete dentures.  Heart sounds are distant.  Breath sounds are decreased.  Central obesity is present.  She has trace edema at the sock line.  Pedal pulses are decreased. ? ?General appearance: Adequately nourished; no acute distress, increased work of breathing is present.   ?Lymphatic: No lymphadenopathy about the head, neck, axilla. ?Eyes: No conjunctival inflammation or lid edema is present. There is no scleral icterus. ?Ears:  External ear exam shows no significant lesions or deformities.   ?Nose:  External nasal examination shows no deformity or inflammation. Nasal mucosa are pink and moist without lesions, exudates ?Oral exam: Lips and gums are healthy appearing.There is no oropharyngeal erythema or exudate. ?Neck:  No thyromegaly, masses, tenderness noted.    ?Heart:  No murmur , gallop, or rub ?Lungs: Chest clear to auscultation without wheezes, rhonchi, rales, rubs. ?Abdomen: Bowel sounds are normal.  Abdomen is soft and nontender with no organomegaly, hernias, masses. ?GU: Deferred  ?Extremities:  No cyanosis, clubbing. ?Neurologic exam:  Balance, Rhomberg, finger to nose testing could not be completed due to clinical state ?Skin: Warm & dry w/o tenting. ?No significant lesions or rash. ? ?See clinical summary under each active problem in the Problem List with associated updated therapeutic plan ? ?

## 2021-09-28 NOTE — Assessment & Plan Note (Signed)
Total protein is normal at 7.7 as is albumin at 4. ?PT/OT at SNF. ?

## 2021-09-28 NOTE — Assessment & Plan Note (Signed)
In the context of end-stage renal disease.  Admission H/H11.9/34.8; at discharge H/H 11.2/34.  No bleeding dyscrasias reported at the SNF. ?

## 2021-09-28 NOTE — Patient Instructions (Signed)
See assessment and plan under each diagnosis in the problem list and acutely for this visit 

## 2021-09-28 NOTE — Assessment & Plan Note (Signed)
Current TSH therapeutic: No change indicated ?

## 2021-09-29 NOTE — Assessment & Plan Note (Signed)
Monitor serum sodium.  Most likely etiology is her ESRD.  She is not on HCTZ. ? ?

## 2021-10-01 ENCOUNTER — Encounter: Payer: Self-pay | Admitting: Adult Health

## 2021-10-01 ENCOUNTER — Non-Acute Institutional Stay (SKILLED_NURSING_FACILITY): Payer: Medicare Other | Admitting: Adult Health

## 2021-10-01 DIAGNOSIS — E871 Hypo-osmolality and hyponatremia: Secondary | ICD-10-CM | POA: Diagnosis not present

## 2021-10-01 DIAGNOSIS — E1122 Type 2 diabetes mellitus with diabetic chronic kidney disease: Secondary | ICD-10-CM | POA: Diagnosis not present

## 2021-10-01 DIAGNOSIS — F419 Anxiety disorder, unspecified: Secondary | ICD-10-CM

## 2021-10-01 DIAGNOSIS — N184 Chronic kidney disease, stage 4 (severe): Secondary | ICD-10-CM

## 2021-10-01 DIAGNOSIS — I350 Nonrheumatic aortic (valve) stenosis: Secondary | ICD-10-CM

## 2021-10-01 DIAGNOSIS — R296 Repeated falls: Secondary | ICD-10-CM

## 2021-10-01 DIAGNOSIS — I129 Hypertensive chronic kidney disease with stage 1 through stage 4 chronic kidney disease, or unspecified chronic kidney disease: Secondary | ICD-10-CM

## 2021-10-01 DIAGNOSIS — K219 Gastro-esophageal reflux disease without esophagitis: Secondary | ICD-10-CM

## 2021-10-01 DIAGNOSIS — E039 Hypothyroidism, unspecified: Secondary | ICD-10-CM

## 2021-10-01 DIAGNOSIS — Z79890 Hormone replacement therapy (postmenopausal): Secondary | ICD-10-CM

## 2021-10-01 DIAGNOSIS — F339 Major depressive disorder, recurrent, unspecified: Secondary | ICD-10-CM

## 2021-10-01 MED ORDER — AMLODIPINE BESYLATE 10 MG PO TABS: 10.0000 mg | ORAL_TABLET | Freq: Every morning | ORAL | 0 refills | Status: DC

## 2021-10-01 MED ORDER — SODIUM BICARBONATE 650 MG PO TABS: 650.0000 mg | ORAL_TABLET | Freq: Every morning | ORAL | 0 refills | Status: AC

## 2021-10-01 MED ORDER — HYDROXYZINE HCL 10 MG PO TABS: 10.0000 mg | ORAL_TABLET | Freq: Two times a day (BID) | ORAL | 0 refills | Status: DC | PRN

## 2021-10-01 MED ORDER — PANTOPRAZOLE SODIUM 40 MG PO TBEC: 40.0000 mg | DELAYED_RELEASE_TABLET | Freq: Every day | ORAL | 0 refills | Status: AC

## 2021-10-01 MED ORDER — LEVOTHYROXINE SODIUM 88 MCG PO TABS: 88.0000 ug | ORAL_TABLET | Freq: Every day | ORAL | 0 refills | Status: DC

## 2021-10-01 MED ORDER — EZETIMIBE 10 MG PO TABS: 10.0000 mg | ORAL_TABLET | Freq: Every morning | ORAL | 0 refills | Status: AC

## 2021-10-01 MED ORDER — TRADJENTA 5 MG PO TABS: 5.0000 mg | ORAL_TABLET | Freq: Every morning | ORAL | 0 refills | Status: AC

## 2021-10-01 MED ORDER — NITROGLYCERIN 0.4 MG SL SUBL: 0.4000 mg | SUBLINGUAL_TABLET | SUBLINGUAL | 0 refills | Status: DC | PRN

## 2021-10-01 MED ORDER — ATORVASTATIN CALCIUM 40 MG PO TABS: 40.0000 mg | ORAL_TABLET | Freq: Every morning | ORAL | 0 refills | Status: AC

## 2021-10-01 MED ORDER — ESTROGENS CONJUGATED 0.625 MG/GM VA CREA: 1.0000 | TOPICAL_CREAM | VAGINAL | 0 refills | Status: DC

## 2021-10-01 MED ORDER — ISOSORBIDE MONONITRATE ER 30 MG PO TB24: 30.0000 mg | ORAL_TABLET | Freq: Every morning | ORAL | 0 refills | Status: AC

## 2021-10-01 MED ORDER — ESCITALOPRAM OXALATE 10 MG PO TABS: 10.0000 mg | ORAL_TABLET | Freq: Every morning | ORAL | 0 refills | Status: AC

## 2021-10-01 MED ORDER — METOPROLOL TARTRATE 25 MG PO TABS: ORAL_TABLET | ORAL | 0 refills | Status: DC

## 2021-10-01 NOTE — Progress Notes (Signed)
? ?Location:  [facility] ?Nursing Home Room Number: 161 W ?Place of Service:  SNF (31) ?Provider:  Durenda Age, DNP, FNP-BC ? ?Patient Care Team: ?Jonathon Bellows, PA-C as PCP - General (Physician Assistant) ? ?Extended Emergency Contact Information ?Primary Emergency Contact: Campbell,Michelle ? [place] ?Mobile Phone: [phone] ?Relation: Daughter ? ?Code Status:  Full Code ? ?Goals of care: Advanced Directive information ? ?  09/25/2021  ? 10:23 AM  ?Advanced Directives  ?Does Patient Have a Medical Advance Directive? No  ?Does patient want to make changes to medical advance directive? No - Patient declined  ? ? ? ?Chief Complaint  ?Patient presents with  ? Discharge Note  ?  For discharge home on 10/02/21 with Home health PT and OT  ? ? ?HPI:  ?Pt is a 88 y.o. female who is for discharge home on 10/02/2021 with home health PT and OT. ? ?She was admitted to [facility] on 09/24/2021 post hospital admission 09/21/2021 to 09/24/2021.  She has a PMH of chronic kidney disease, CAD, depression, type 2 diabetes mellitus, gout, hypertension, severe aortic stenosis with history of pacemaker placement and hypothyroidism.  She presented to the ED due to frequent urination and lower abdominal pain a week prior to admission.  Labs showed sodium 125 for which she was given IV fluids.  Sodium improved to 132.  She was having frequent falls and unsteady gait at home.  CT of the head and cervical spine were unremarkable.  She was recommended by PT and OT for short-term rehabilitation. ? ?Patient was admitted to this facility for short-term rehabilitation after the patient's recent hospitalization.  Patient has completed SNF rehabilitation and therapy has cleared the patient for discharge. ? ? ?Past Medical History:  ?Diagnosis Date  ? CKD (chronic kidney disease)   ? a. followed by [facility]. Neta Ehlers at [facility]   ? Coronary artery disease   ? 02/23/17 PCI/DES to mRCA,   normal EF  ? Depression   ? Diabetes mellitus ([place])   ? Gout   ? Hypertension   ? Hypothyroidism   ? Presence of permanent cardiac pacemaker   ? a. followed by [facility]  ? S/P TAVR (transcatheter aortic valve replacement) 04/12/2017  ? 23 mm Edwards Sapien 3 transcatheter heart valve placed via percutaneous right transfemoral approach   ? Severe aortic stenosis   ? a. diagnosed 01/2017 during admission for chest pain.   ? ?Past Surgical History:  ?Procedure Laterality Date  ? APPENDECTOMY    ? CESAREAN SECTION    ? CORONARY ATHERECTOMY N/A 02/23/2017  ? Procedure: CORONARY ATHERECTOMY;  Surgeon: [name], MD;  Location: [place] [facility];  Service: Cardiovascular;  Laterality: N/A;  ? ESOPHAGOGASTRODUODENOSCOPY N/A 02/18/2017  ? Procedure: ESOPHAGOGASTRODUODENOSCOPY (EGD);  Surgeon: Milus Banister, MD;  Location: [facility] ENDOSCOPY;  Service: Endoscopy;  Laterality: N/A;  ? EYE SURGERY    ? FEMUR FRACTURE SURGERY    ? HOT HEMOSTASIS N/A 02/18/2017  ? Procedure: HOT HEMOSTASIS (ARGON PLASMA COAGULATION/BICAP);  Surgeon: Milus Banister, MD;  Location: [facility] ENDOSCOPY;  Service: Endoscopy;  Laterality: N/A;  ? IR RADIOLOGY PERIPHERAL GUIDED IV START  03/07/2017  ? IR US GUIDE VASC ACCESS RIGHT  03/07/2017  ? RIGHT/LEFT HEART CATH AND CORONARY ANGIOGRAPHY N/A 02/16/2017  ? Procedure: RIGHT/LEFT HEART CATH AND CORONARY ANGIOGRAPHY;  Surgeon: Wellington Hampshire, MD;  Location: [place] [facility];  Service: Cardiovascular;  Laterality: N/A;  ? ROTATOR CUFF REPAIR    ?  TEE WITHOUT CARDIOVERSION N/A 04/12/2017  ? Procedure: TRANSESOPHAGEAL ECHOCARDIOGRAM (TEE);  Surgeon: [name], MD;  Location: [place];  Service: Open Heart Surgery;  Laterality: N/A;  ? THYROID SURGERY    ? TRANSCATHETER AORTIC VALVE REPLACEMENT, TRANSFEMORAL N/A 04/12/2017  ? Procedure: TRANSCATHETER AORTIC VALVE REPLACEMENT, TRANSFEMORAL;  Surgeon: [name], MD;  Location: [place];  Service: Open Heart Surgery;  Laterality: N/A;  ? ? ?Allergies   ?Allergen Reactions  ? Oxybutynin Other (See Comments)  ?  Caused hallucinations  ? Oysters [Shellfish Allergy] Nausea And Vomiting  ? Sulfa Antibiotics Nausea And Vomiting  ? ? ?Outpatient Encounter Medications as of 10/01/2021  ?Medication Sig  ? amLODipine (NORVASC) 10 MG tablet Take 1 tablet (10 mg total) by mouth every morning.  ? Apoaequorin (PREVAGEN PO) Take 1 capsule by mouth every morning.  ? Ascorbic Acid (VITAMIN C PO) Take 1 tablet by mouth every morning.  ? aspirin 81 MG chewable tablet Chew 1 tablet (81 mg total) by mouth daily.  ? atorvastatin (LIPITOR) 40 MG tablet Take 1 tablet (40 mg total) by mouth every morning.  ? B Complex Vitamins (VITAMIN B COMPLEX PO) Take 1 capsule by mouth every morning.  ? bisacodyl (DULCOLAX) 10 MG suppository If not relieved by MOM, give 10 mg Bisacodyl suppositiory rectally X 1 dose in 24 hours as needed  ? Coenzyme Q10 (COQ-10 PO) Take 1 capsule by mouth every morning.  ? [START ON 10/02/2021] conjugated estrogens (PREMARIN) vaginal cream Place 1 Applicatorful vaginally every Monday, Wednesday, and Friday.  ? escitalopram (LEXAPRO) 10 MG tablet Take 1 tablet (10 mg total) by mouth every morning.  ? ezetimibe (ZETIA) 10 MG tablet Take 1 tablet (10 mg total) by mouth every morning.  ? hydrOXYzine (ATARAX) 10 MG tablet Take 1 tablet (10 mg total) by mouth 2 (two) times daily as needed for anxiety.  ? isosorbide mononitrate (IMDUR) 30 MG 24 hr tablet Take 1 tablet (30 mg total) by mouth every morning.  ? levothyroxine (SYNTHROID) 88 MCG tablet Take 1 tablet (88 mcg total) by mouth daily before breakfast.  ? lidocaine (LIDODERM) 5 % Place 1 patch onto the skin daily as needed (pain). Remove & Discard patch within 12 hours or as directed by MD  ? Magnesium Hydroxide (MILK OF MAGNESIA PO) Take by mouth. If no BM in 3 days, give 30 cc Milk of Magnesium p.o. x 1 dose in 24 hours as needed  ? metoprolol tartrate (LOPRESSOR) 25 MG tablet TAKE 1/2 (ONE-HALF) TABLET BY MOUTH TWICE  DAILY  ? nitroGLYCERIN (NITROSTAT) 0.4 MG SL tablet Place 1 tablet (0.4 mg total) under the tongue every 5 (five) minutes as needed for chest pain.  ? NON FORMULARY Diet:Heart Healthy/CCD/1200 fluid restriction  ? OYSTER SHELL CALCIUM PO Take by mouth. 500 MG TB; TAKE 1 TABLET BY MOUTH ONCE DAILY FOR SUPPLEMENT  ? pantoprazole (PROTONIX) 40 MG tablet Take 1 tablet (40 mg total) by mouth daily.  ? sodium bicarbonate 650 MG tablet Take 1 tablet (650 mg total) by mouth every morning.  ? Sodium Phosphates (RA SALINE ENEMA RE) If not relieved by Biscodyl suppository, give disposable Saline Enema rectally X 1 dose/24 hrs as needed  ? TRADJENTA 5 MG TABS tablet Take 1 tablet (5 mg total) by mouth every morning.  ? VITAMIN D PO Take 1 tablet by mouth See admin instructions. Take 1 tablet by mouth every other night  ? [DISCONTINUED] amLODipine (NORVASC) 10 MG tablet Take 10 mg by mouth  every morning.  ? [DISCONTINUED] atorvastatin (LIPITOR) 40 MG tablet Take 40 mg by mouth every morning.  ? [DISCONTINUED] conjugated estrogens (PREMARIN) vaginal cream Place 1 applicator vaginally every Monday, Wednesday, and Friday.  ? [DISCONTINUED] escitalopram (LEXAPRO) 10 MG tablet Take 10 mg by mouth every morning.  ? [DISCONTINUED] ezetimibe (ZETIA) 10 MG tablet Take 10 mg by mouth every morning.  ? [DISCONTINUED] hydrOXYzine (ATARAX) 10 MG tablet Take 10 mg by mouth 2 (two) times daily as needed for anxiety.  ? [DISCONTINUED] isosorbide mononitrate (IMDUR) 30 MG 24 hr tablet Take 30 mg by mouth every morning.  ? [DISCONTINUED] levothyroxine (SYNTHROID) 88 MCG tablet Take 88 mcg by mouth daily before breakfast.  ? [DISCONTINUED] metoprolol tartrate (LOPRESSOR) 25 MG tablet TAKE 1/2 (ONE-HALF) TABLET BY MOUTH TWICE DAILY  ? [DISCONTINUED] nitroGLYCERIN (NITROSTAT) 0.4 MG SL tablet Place 1 tablet (0.4 mg total) under the tongue every 5 (five) minutes as needed for chest pain.  ? [DISCONTINUED] pantoprazole (PROTONIX) 40 MG tablet Take 1  tablet (40 mg total) by mouth daily.  ? [DISCONTINUED] sodium bicarbonate 650 MG tablet Take 650 mg by mouth every morning.  ? [DISCONTINUED] TRADJENTA 5 MG TABS tablet Take 5 mg by mouth every morning.  ? ?No

## 2021-10-02 ENCOUNTER — Other Ambulatory Visit: Payer: Self-pay | Admitting: Adult Health

## 2021-10-02 DIAGNOSIS — I129 Hypertensive chronic kidney disease with stage 1 through stage 4 chronic kidney disease, or unspecified chronic kidney disease: Secondary | ICD-10-CM

## 2021-10-02 MED ORDER — AMLODIPINE BESYLATE 10 MG PO TABS: 10.0000 mg | ORAL_TABLET | Freq: Every morning | ORAL | 0 refills | Status: DC

## 2022-05-13 ENCOUNTER — Emergency Department (HOSPITAL_BASED_OUTPATIENT_CLINIC_OR_DEPARTMENT_OTHER): Payer: Medicare Other

## 2022-05-13 ENCOUNTER — Emergency Department (HOSPITAL_BASED_OUTPATIENT_CLINIC_OR_DEPARTMENT_OTHER)
Admission: EM | Admit: 2022-05-13 | Discharge: 2022-05-14 | Disposition: A | Discharge status: Home / Self Care | Payer: Medicare Other | Attending: Emergency Medicine | Admitting: Emergency Medicine

## 2022-05-13 DIAGNOSIS — W01198A Fall on same level from slipping, tripping and stumbling with subsequent striking against other object, initial encounter: Secondary | ICD-10-CM | POA: Diagnosis not present

## 2022-05-13 DIAGNOSIS — S42215D Unspecified nondisplaced fracture of surgical neck of left humerus, subsequent encounter for fracture with routine healing: Secondary | ICD-10-CM | POA: Diagnosis not present

## 2022-05-13 DIAGNOSIS — Z79899 Other long term (current) drug therapy: Secondary | ICD-10-CM | POA: Insufficient documentation

## 2022-05-13 DIAGNOSIS — S4992XD Unspecified injury of left shoulder and upper arm, subsequent encounter: Secondary | ICD-10-CM | POA: Diagnosis present

## 2022-05-13 DIAGNOSIS — S0990XA Unspecified injury of head, initial encounter: Secondary | ICD-10-CM | POA: Diagnosis not present

## 2022-05-13 DIAGNOSIS — I129 Hypertensive chronic kidney disease with stage 1 through stage 4 chronic kidney disease, or unspecified chronic kidney disease: Secondary | ICD-10-CM | POA: Diagnosis not present

## 2022-05-13 DIAGNOSIS — N189 Chronic kidney disease, unspecified: Secondary | ICD-10-CM | POA: Insufficient documentation

## 2022-05-13 DIAGNOSIS — Z7982 Long term (current) use of aspirin: Secondary | ICD-10-CM | POA: Insufficient documentation

## 2022-05-13 DIAGNOSIS — S42215P Unspecified nondisplaced fracture of surgical neck of left humerus, subsequent encounter for fracture with malunion: Secondary | ICD-10-CM

## 2022-05-13 LAB — COMPREHENSIVE METABOLIC PANEL
ALT: 6 U/L (ref 0–44)
AST: 30 U/L (ref 15–41)
Albumin: 3.3 g/dL — ABNORMAL LOW (ref 3.5–5.0)
Alkaline Phosphatase: 80 U/L (ref 38–126)
Anion gap: 9 (ref 5–15)
BUN: 45 mg/dL — ABNORMAL HIGH (ref 8–23)
CO2: 21 mmol/L — ABNORMAL LOW (ref 22–32)
Calcium: 8.5 mg/dL — ABNORMAL LOW (ref 8.9–10.3)
Chloride: 105 mmol/L (ref 98–111)
Creatinine, Ser: 3.86 mg/dL — ABNORMAL HIGH (ref 0.44–1.00)
GFR, Estimated: 11 mL/min — ABNORMAL LOW (ref >60)
Glucose, Bld: 256 mg/dL — ABNORMAL HIGH (ref 70–99)
Potassium: 4.2 mmol/L (ref 3.5–5.1)
Sodium: 135 mmol/L (ref 135–145)
Total Bilirubin: 0.5 mg/dL (ref 0.3–1.2)
Total Protein: 6.8 g/dL (ref 6.5–8.1)

## 2022-05-13 LAB — CBC WITH DIFFERENTIAL/PLATELET
Abs Immature Granulocytes: 0.04 10*3/uL (ref 0.00–0.07)
Basophils Absolute: 0 10*3/uL (ref 0.0–0.1)
Basophils Relative: 0 %
Eosinophils Absolute: 0.1 10*3/uL (ref 0.0–0.5)
Eosinophils Relative: 1 %
HCT: 28.4 % — ABNORMAL LOW (ref 36.0–46.0)
Hemoglobin: 9.1 g/dL — ABNORMAL LOW (ref 12.0–15.0)
Immature Granulocytes: 1 %
Lymphocytes Relative: 18 %
Lymphs Abs: 1.5 10*3/uL (ref 0.7–4.0)
MCH: 28 pg (ref 26.0–34.0)
MCHC: 32 g/dL (ref 30.0–36.0)
MCV: 87.4 fL (ref 80.0–100.0)
Monocytes Absolute: 0.9 10*3/uL (ref 0.1–1.0)
Monocytes Relative: 11 %
Neutro Abs: 6 10*3/uL (ref 1.7–7.7)
Neutrophils Relative %: 69 %
Platelets: 166 10*3/uL (ref 150–400)
RBC: 3.25 MIL/uL — ABNORMAL LOW (ref 3.87–5.11)
RDW: 16.2 % — ABNORMAL HIGH (ref 11.5–15.5)
WBC: 8.6 10*3/uL (ref 4.0–10.5)
nRBC: 0.2 % (ref 0.0–0.2)

## 2022-05-13 LAB — TROPONIN I (HIGH SENSITIVITY): Troponin I (High Sensitivity): 114 ng/L (ref <18)

## 2022-05-13 MED ORDER — TRAMADOL HCL 50 MG PO TABS
50.0000 mg | ORAL_TABLET | Freq: Once | ORAL | Status: AC
  Administered 2022-05-13: 50 mg via ORAL
  Filled 2022-05-13: qty 1

## 2022-05-13 MED ORDER — SODIUM CHLORIDE 0.9 % IV BOLUS
500.0000 mL | Freq: Once | INTRAVENOUS | Status: AC
  Administered 2022-05-13: 500 mL via INTRAVENOUS

## 2022-05-13 NOTE — ED Provider Notes (Signed)
Blood pressure (!) 134/44, pulse 70, temperature 98.4 F (36.9 C), resp. rate 16, SpO2 97 %.  Assuming care from Dr. Darl Householder.  In short, Molly Cobb is a 88 y.o. female with a chief complaint of Fall .  Refer to the original H&P for additional details.  The current plan of care is to follow up with vascular surgery and repeat troponin.   Spoke with Vascular Surgery at [name]. Discussed my exam and CT imaging results of the humerus along with labs. Inflammation and CT findings consistent with post-op day 2. Advises patient call the clinic in the AM and can be evaluated at that time.   12:29 AM Repeat troponin not significantly changed from initial.  Suspect elevation is related to her chronic kidney disease.  Discussed results and plan with family at bedside.  They will call the vascular surgery office tomorrow for close follow-up in the morning.  Discussed tricked ED return precaution.  Patient and family are comfortable with the plan at discharge.     Molly Fast, MD 05/14/22 0030

## 2022-05-13 NOTE — ED Notes (Signed)
Pt denies other pain or complaints related to fall.

## 2022-05-13 NOTE — ED Provider Notes (Signed)
[facility] EMERGENCY DEPARTMENT Provider Note   CSN: [id number] Arrival date & time: 05/13/22  2037     History  Chief Complaint  Patient presents with  . Fall    Sherral Dirocco is a 88 y.o. female hx of HTN, CKD, here presenting with fall.  Patient states that she slipped and fell and hit her head and face.  She apparently had a fall about a week ago and went to the ED and no fractures were found.  Patient also had a left AV fistula placed 2 days ago.  She states that the site is a little bit more red compared to previous.  Patient denies any chest pain.  She denies passing out  The history is provided by the patient.       Home Medications Prior to Admission medications   Medication Sig Start Date End Date Taking? Authorizing Provider  amLODipine (NORVASC) 10 MG tablet Take 1 tablet (10 mg total) by mouth every morning. 10/02/21   Medina-Vargas, Monina C, NP  Apoaequorin (PREVAGEN PO) Take 1 capsule by mouth every morning.    [provider]  Ascorbic Acid (VITAMIN C PO) Take 1 tablet by mouth every morning.    [provider]  aspirin 81 MG chewable tablet Chew 1 tablet (81 mg total) by mouth daily. 02/25/17   Cheryln Manly, NP  atorvastatin (LIPITOR) 40 MG tablet Take 1 tablet (40 mg total) by mouth every morning. 10/01/21   Medina-Vargas, Monina C, NP  B Complex Vitamins (VITAMIN B COMPLEX PO) Take 1 capsule by mouth every morning.    [provider]  bisacodyl (DULCOLAX) 10 MG suppository If not relieved by MOM, give 10 mg Bisacodyl suppositiory rectally X 1 dose in 24 hours as needed    [provider]  Coenzyme Q10 (COQ-10 PO) Take 1 capsule by mouth every morning.    [provider]  conjugated estrogens (PREMARIN) vaginal cream Place 1 Applicatorful vaginally every Monday, Wednesday, and Friday. 10/02/21   Medina-Vargas, Monina C, NP  escitalopram (LEXAPRO) 10 MG tablet Take 1 tablet (10 mg total) by mouth every morning.  10/01/21   Medina-Vargas, Monina C, NP  ezetimibe (ZETIA) 10 MG tablet Take 1 tablet (10 mg total) by mouth every morning. 10/01/21   Medina-Vargas, Monina C, NP  hydrOXYzine (ATARAX) 10 MG tablet Take 1 tablet (10 mg total) by mouth 2 (two) times daily as needed for anxiety. 10/01/21   Medina-Vargas, Monina C, NP  isosorbide mononitrate (IMDUR) 30 MG 24 hr tablet Take 1 tablet (30 mg total) by mouth every morning. 10/01/21   Medina-Vargas, Monina C, NP  levothyroxine (SYNTHROID) 88 MCG tablet Take 1 tablet (88 mcg total) by mouth daily before breakfast. 10/01/21   Medina-Vargas, Monina C, NP  lidocaine (LIDODERM) 5 % Place 1 patch onto the skin daily as needed (pain). Remove & Discard patch within 12 hours or as directed by MD    [provider]  Magnesium Hydroxide (MILK OF MAGNESIA PO) Take by mouth. If no BM in 3 days, give 30 cc Milk of Magnesium p.o. x 1 dose in 24 hours as needed    [provider]  metoprolol tartrate (LOPRESSOR) 25 MG tablet TAKE 1/2 (ONE-HALF) TABLET BY MOUTH TWICE DAILY 10/01/21   Medina-Vargas, Monina C, NP  nitroGLYCERIN (NITROSTAT) 0.4 MG SL tablet Place 1 tablet (0.4 mg total) under the tongue every 5 (five) minutes as needed for chest pain. 10/01/21   Medina-Vargas, Senaida Lange, NP  NON FORMULARY Diet:Heart Healthy/CCD/1200 fluid restriction    [provider]  OYSTER SHELL CALCIUM PO Take by mouth. 500 MG TB; TAKE 1 TABLET BY MOUTH ONCE DAILY FOR SUPPLEMENT    [provider]  pantoprazole (PROTONIX) 40 MG tablet Take 1 tablet (40 mg total) by mouth daily. 10/01/21   Medina-Vargas, Monina C, NP  sodium bicarbonate 650 MG tablet Take 1 tablet (650 mg total) by mouth every morning. 10/01/21   Medina-Vargas, Monina C, NP  Sodium Phosphates (RA SALINE ENEMA RE) If not relieved by Biscodyl suppository, give disposable Saline Enema rectally X 1 dose/24 hrs as needed    [provider]  TRADJENTA 5 MG TABS tablet Take 1 tablet (5 mg total) by mouth  every morning. 10/01/21   Medina-Vargas, Monina C, NP  VITAMIN D PO Take 1 tablet by mouth See admin instructions. Take 1 tablet by mouth every other night    [provider]      Allergies    Oxybutynin, Oysters [shellfish allergy], and Sulfa antibiotics    Review of Systems   Review of Systems  Musculoskeletal:        Left shoulder pain  Neurological:  Positive for headaches.  All other systems reviewed and are negative.   Physical Exam Updated Vital Signs BP 110/74   Pulse 71   Temp 98.4 F (36.9 C)   Resp 17   SpO2 99%  Physical Exam Vitals and nursing note reviewed.  Constitutional:      Comments: Chronically ill  HENT:     Head: Normocephalic.     Comments: Mild tenderness over the bridge of the nose but no obvious deformity Eyes:     Extraocular Movements: Extraocular movements intact.     Pupils: Pupils are equal, round, and reactive to light.  Cardiovascular:     Rate and Rhythm: Normal rate and regular rhythm.     Pulses: Normal pulses.     Heart sounds: Normal heart sounds.  Pulmonary:     Effort: Pulmonary effort is normal.     Breath sounds: Normal breath sounds.  Abdominal:     General: Abdomen is flat.     Palpations: Abdomen is soft.  Musculoskeletal:     Cervical back: Normal range of motion and neck supple.     Comments: Previous left shoulder surgery.  Patient has some tenderness over the shoulder.  Patient also has a recent left AV fistula and there is some erythema around it.  There is no obvious fluctuance or purulent drainage.  Skin:    General: Skin is warm.  Neurological:     General: No focal deficit present.  Psychiatric:        Mood and Affect: Mood normal.        Behavior: Behavior normal.    ED Results / Procedures / Treatments   Labs (all labs ordered are listed, but only abnormal results are displayed) Labs Reviewed  CBC WITH DIFFERENTIAL/PLATELET - Abnormal; Notable for the following components:      Result Value    RBC 3.25 (*)    Hemoglobin 9.1 (*)    HCT 28.4 (*)    RDW 16.2 (*)    All other components within normal limits  COMPREHENSIVE METABOLIC PANEL - Abnormal; Notable for the following components:   CO2 21 (*)    Glucose, Bld 256 (*)    BUN 45 (*)    Creatinine, Ser 3.86 (*)    Calcium 8.5 (*)  Albumin 3.3 (*)    GFR, Estimated 11 (*)    All other components within normal limits  TROPONIN I (HIGH SENSITIVITY)    EKG EKG Interpretation  Date/Time:  Thursday May 13 2022 21:06:28 EST Ventricular Rate:  70 PR Interval:    QRS Duration: 167 QT Interval:  483 QTC Calculation: 522 R Axis:   20 Text Interpretation: paced rhythmn Left bundle branch block Baseline wander in lead(s) V1 No significant change since last tracing Confirmed by Wandra Arthurs [phone]) on 05/13/2022 9:28:47 PM  Radiology DG Humerus Left  Result Date: 05/13/2022 CLINICAL DATA:  Fall, left arm pain EXAM: LEFT HUMERUS - 2+ VIEW COMPARISON:  10/06/2021 FINDINGS: There is an acute, overriding, anterior displaced fracture of the surgical neck of the left humerus with 1 shaft with anterior displacement and roughly 2 cm override of the distal fracture fragment. The lack of significant callus formation increasing displacement since prior examination suggests repeat fracture though this could be better characterized with CT imaging. No dislocation; the humeral head is seated within the glenoid fossa. Acromioclavicular joint space is preserved. Limited evaluation of the left hemithorax is unremarkable. IMPRESSION: 1. Suspected repeat acute, overriding, anterior displaced fracture of the surgical neck of the left humerus. Acuity could be confirmed with CT imaging. Electronically Signed   By: Fidela Salisbury M.D.   On: 05/13/2022 21:49   CT Head Wo Contrast  Result Date: 05/13/2022 CLINICAL DATA:  Head trauma EXAM: CT HEAD WITHOUT CONTRAST CT MAXILLOFACIAL WITHOUT CONTRAST CT CERVICAL SPINE WITHOUT CONTRAST TECHNIQUE:  Multidetector CT imaging of the head, cervical spine, and maxillofacial structures were performed using the standard protocol without intravenous contrast. Multiplanar CT image reconstructions of the cervical spine and maxillofacial structures were also generated. RADIATION DOSE REDUCTION: This exam was performed according to the departmental dose-optimization program which includes automated exposure control, adjustment of the mA and/or kV according to patient size and/or use of iterative reconstruction technique. COMPARISON:  CT brain and cervical spine 09/22/2021, CT 12/20/2019 FINDINGS: CT HEAD FINDINGS Brain: No acute territorial infarction, hemorrhage or intracranial mass. Atrophy. Moderate white matter hypodensity consistent with chronic small vessel ischemic change. Age indeterminate hypodensity in the right thalamo capsular region, series 2, image 14. Nonenlarged ventricles. Vascular: No hyperdense vessels.  Carotid vascular calcification. Skull: Normal. Negative for fracture or focal lesion. Other: None CT MAXILLOFACIAL FINDINGS Osseous: Mastoid air cells are clear. Mandibular heads are normally position. No mandibular fracture. Pterygoid plates and zygomatic arches appear intact. No acute nasal bone fracture. Orbits: Negative. No traumatic or inflammatory finding. Sinuses: Fluid levels in the right sphenoid sinuses but no definitive skull base fracture. Soft tissues: Stable 11 mm left parotid nodule or node. CT CERVICAL SPINE FINDINGS Alignment: Straightening of the cervical spine. No subluxation. Facet alignment within normal limits. Skull base and vertebrae: No acute fracture. No primary bone lesion or focal pathologic process. Soft tissues and spinal canal: No prevertebral fluid or swelling. No visible canal hematoma. Disc levels: Moderate disc space narrowing and degenerative change C5 through C7. Multilevel facet degenerative change Upper chest: Negative. Other: None IMPRESSION: 1. Negative for acute  intracranial hemorrhage. Atrophy and chronic small vessel ischemic changes of the white matter. 2. Age indeterminate small right the thalamo capsular infarct, but new compared with CT from March 3. No acute facial bone fracture 4. Straightening of the cervical spine with degenerative change. No acute osseous abnormality Electronically Signed   By: Donavan Foil M.D.   On: 05/13/2022 21:44   CT  Cervical Spine Wo Contrast  Result Date: 05/13/2022 CLINICAL DATA:  Head trauma EXAM: CT HEAD WITHOUT CONTRAST CT MAXILLOFACIAL WITHOUT CONTRAST CT CERVICAL SPINE WITHOUT CONTRAST TECHNIQUE: Multidetector CT imaging of the head, cervical spine, and maxillofacial structures were performed using the standard protocol without intravenous contrast. Multiplanar CT image reconstructions of the cervical spine and maxillofacial structures were also generated. RADIATION DOSE REDUCTION: This exam was performed according to the departmental dose-optimization program which includes automated exposure control, adjustment of the mA and/or kV according to patient size and/or use of iterative reconstruction technique. COMPARISON:  CT brain and cervical spine 09/22/2021, CT 12/20/2019 FINDINGS: CT HEAD FINDINGS Brain: No acute territorial infarction, hemorrhage or intracranial mass. Atrophy. Moderate white matter hypodensity consistent with chronic small vessel ischemic change. Age indeterminate hypodensity in the right thalamo capsular region, series 2, image 14. Nonenlarged ventricles. Vascular: No hyperdense vessels.  Carotid vascular calcification. Skull: Normal. Negative for fracture or focal lesion. Other: None CT MAXILLOFACIAL FINDINGS Osseous: Mastoid air cells are clear. Mandibular heads are normally position. No mandibular fracture. Pterygoid plates and zygomatic arches appear intact. No acute nasal bone fracture. Orbits: Negative. No traumatic or inflammatory finding. Sinuses: Fluid levels in the right sphenoid sinuses but no  definitive skull base fracture. Soft tissues: Stable 11 mm left parotid nodule or node. CT CERVICAL SPINE FINDINGS Alignment: Straightening of the cervical spine. No subluxation. Facet alignment within normal limits. Skull base and vertebrae: No acute fracture. No primary bone lesion or focal pathologic process. Soft tissues and spinal canal: No prevertebral fluid or swelling. No visible canal hematoma. Disc levels: Moderate disc space narrowing and degenerative change C5 through C7. Multilevel facet degenerative change Upper chest: Negative. Other: None IMPRESSION: 1. Negative for acute intracranial hemorrhage. Atrophy and chronic small vessel ischemic changes of the white matter. 2. Age indeterminate small right the thalamo capsular infarct, but new compared with CT from March 3. No acute facial bone fracture 4. Straightening of the cervical spine with degenerative change. No acute osseous abnormality Electronically Signed   By: Donavan Foil M.D.   On: 05/13/2022 21:44   CT Maxillofacial Wo Contrast  Result Date: 05/13/2022 CLINICAL DATA:  Head trauma EXAM: CT HEAD WITHOUT CONTRAST CT MAXILLOFACIAL WITHOUT CONTRAST CT CERVICAL SPINE WITHOUT CONTRAST TECHNIQUE: Multidetector CT imaging of the head, cervical spine, and maxillofacial structures were performed using the standard protocol without intravenous contrast. Multiplanar CT image reconstructions of the cervical spine and maxillofacial structures were also generated. RADIATION DOSE REDUCTION: This exam was performed according to the departmental dose-optimization program which includes automated exposure control, adjustment of the mA and/or kV according to patient size and/or use of iterative reconstruction technique. COMPARISON:  CT brain and cervical spine 09/22/2021, CT 12/20/2019 FINDINGS: CT HEAD FINDINGS Brain: No acute territorial infarction, hemorrhage or intracranial mass. Atrophy. Moderate white matter hypodensity consistent with chronic small  vessel ischemic change. Age indeterminate hypodensity in the right thalamo capsular region, series 2, image 14. Nonenlarged ventricles. Vascular: No hyperdense vessels.  Carotid vascular calcification. Skull: Normal. Negative for fracture or focal lesion. Other: None CT MAXILLOFACIAL FINDINGS Osseous: Mastoid air cells are clear. Mandibular heads are normally position. No mandibular fracture. Pterygoid plates and zygomatic arches appear intact. No acute nasal bone fracture. Orbits: Negative. No traumatic or inflammatory finding. Sinuses: Fluid levels in the right sphenoid sinuses but no definitive skull base fracture. Soft tissues: Stable 11 mm left parotid nodule or node. CT CERVICAL SPINE FINDINGS Alignment: Straightening of the cervical spine. No subluxation. Facet alignment  within normal limits. Skull base and vertebrae: No acute fracture. No primary bone lesion or focal pathologic process. Soft tissues and spinal canal: No prevertebral fluid or swelling. No visible canal hematoma. Disc levels: Moderate disc space narrowing and degenerative change C5 through C7. Multilevel facet degenerative change Upper chest: Negative. Other: None IMPRESSION: 1. Negative for acute intracranial hemorrhage. Atrophy and chronic small vessel ischemic changes of the white matter. 2. Age indeterminate small right the thalamo capsular infarct, but new compared with CT from March 3. No acute facial bone fracture 4. Straightening of the cervical spine with degenerative change. No acute osseous abnormality Electronically Signed   By: Donavan Foil M.D.   On: 05/13/2022 21:44   DG Chest 1 View  Result Date: 05/13/2022 CLINICAL DATA:  Fall. EXAM: CHEST  1 VIEW COMPARISON:  Chest radiograph dated 12/12/2021. FINDINGS: No focal consolidation, pleural effusion, or pneumothorax. The cardiac silhouette is within limits. 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 Osteopenia with degenerative changes of the spine. Chronic left  humeral neck fracture. No acute osseous pathology. IMPRESSION: No active disease. Electronically Signed   By: Anner Crete M.D.   On: 05/13/2022 21:37    Procedures Procedures    Medications Ordered in ED Medications  traMADol (ULTRAM) tablet 50 mg (50 mg Oral Given 05/13/22 2132)  sodium chloride 0.9 % bolus 500 mL (500 mLs Intravenous New Bag/Given 05/13/22 2139)    ED Course/ Medical Decision Making/ A&P                           Medical Decision Making Poppi Scantling is a 88 y.o. female here presenting with fall.  Patient also had recent life AV fistula placed and the area appears slightly erythematous but appears to have good blood flow.  Plan to get CBC and CMP and CT head neck and face and left shoulder x-ray.  11:05 PM I reviewed patient's labs and independently interpreted imaging studies.  Patient's hemoglobin is 9.1 and was 10 a week ago at [facility].  Her creatinine is 3.8 and was 3.5 several days ago.  Patient troponin is 114 but I am not clear if this is from her renal failure or not.  Patient has no chest pain.  Also x-ray showed possible left humerus fracture with unclear time course and she does have tenderness in the left proximal humerus.  CT of the left humerus was ordered.   11:20 pm CT of the humerus was pending.  If it shows a proximal humerus fracture, patient cannot get the sling due to recent AV graft placement.  Also CT can help assess for abscess around the AV graft.  The AV graft appears swollen and red and may be postop changes.  Signed out to Dr. Laverta Baltimore in the ED  Amount and/or Complexity of Data Reviewed Labs: ordered. Radiology: ordered. ECG/medicine tests: ordered.  Risk Prescription drug management.    Final Clinical Impression(s) / ED Diagnoses Final diagnoses:  None    Rx / DC Orders ED Discharge Orders     None         Drenda Freeze, MD 05/13/22 2326

## 2022-05-13 NOTE — ED Triage Notes (Signed)
Pt BIB EMS from [place] [facility]. Unwitnessed mechanical fall tonight. No thinners. No loc. Pt c/o nose pain and left upper arm pain.

## 2022-05-13 NOTE — ED Notes (Signed)
Pt to CT

## 2022-05-14 LAB — TROPONIN I (HIGH SENSITIVITY): Troponin I (High Sensitivity): 117 ng/L (ref <18)

## 2022-05-14 NOTE — Discharge Instructions (Signed)
Your x-rays and CT scans did not show any new broken bones in the arm.  You may have soreness and can take Tylenol or topical medicines for pain.  As we discussed, please call your vascular surgeon at [facility] tomorrow morning.  I would like for them to fit you in for a follow-up appointment tomorrow to evaluate your surgical site in the left arm.

## 2022-05-31 ENCOUNTER — Other Ambulatory Visit: Payer: Self-pay

## 2022-05-31 ENCOUNTER — Emergency Department (HOSPITAL_BASED_OUTPATIENT_CLINIC_OR_DEPARTMENT_OTHER): Payer: Medicare Other

## 2022-05-31 ENCOUNTER — Emergency Department (HOSPITAL_BASED_OUTPATIENT_CLINIC_OR_DEPARTMENT_OTHER)
Admission: EM | Admit: 2022-05-31 | Discharge: 2022-05-31 | Disposition: A | Discharge status: Home / Self Care | Payer: Medicare Other | Attending: Emergency Medicine | Admitting: Emergency Medicine

## 2022-05-31 ENCOUNTER — Encounter (HOSPITAL_BASED_OUTPATIENT_CLINIC_OR_DEPARTMENT_OTHER): Payer: Self-pay | Admitting: Urology

## 2022-05-31 DIAGNOSIS — Z79899 Other long term (current) drug therapy: Secondary | ICD-10-CM | POA: Diagnosis not present

## 2022-05-31 DIAGNOSIS — Z7982 Long term (current) use of aspirin: Secondary | ICD-10-CM | POA: Insufficient documentation

## 2022-05-31 DIAGNOSIS — W19XXXA Unspecified fall, initial encounter: Secondary | ICD-10-CM | POA: Insufficient documentation

## 2022-05-31 DIAGNOSIS — S322XXA Fracture of coccyx, initial encounter for closed fracture: Secondary | ICD-10-CM | POA: Diagnosis not present

## 2022-05-31 DIAGNOSIS — M7918 Myalgia, other site: Secondary | ICD-10-CM

## 2022-05-31 DIAGNOSIS — S32592A Other specified fracture of left pubis, initial encounter for closed fracture: Secondary | ICD-10-CM | POA: Diagnosis not present

## 2022-05-31 DIAGNOSIS — S3992XA Unspecified injury of lower back, initial encounter: Secondary | ICD-10-CM | POA: Diagnosis present

## 2022-05-31 MED ORDER — LIDOCAINE 5 % EX PTCH
1.0000 | MEDICATED_PATCH | Freq: Once | CUTANEOUS | Status: DC
  Filled 2022-05-31: qty 1

## 2022-05-31 MED ORDER — LIDOCAINE 5 % EX PTCH
2.0000 | MEDICATED_PATCH | CUTANEOUS | Status: DC
  Administered 2022-05-31: 2 via TRANSDERMAL
  Filled 2022-05-31: qty 2

## 2022-05-31 MED ORDER — LIDOCAINE 5 % EX PTCH: 1.0000 | MEDICATED_PATCH | CUTANEOUS | 0 refills | Status: AC

## 2022-05-31 NOTE — ED Triage Notes (Signed)
Pt states fell x 2 weeks ago onto buttocks States pain in buttocks worse with sitting  Pt denies hitting head, no LOC, No thinners

## 2022-05-31 NOTE — Discharge Instructions (Signed)
Please read and follow all provided instructions.  Your diagnoses today include:  1. Bilateral buttock pain   2. Fracture of coccyx, initial encounter for closed fracture ([place])   3. Closed fracture of left inferior pubic ramus, initial encounter ([place])     Tests performed today include: X-ray of the sacrum and tailbone appeared okay CT of the whole pelvis shows a possible tailbone fracture is well as a left-sided pelvic fracture that is already healing Vital signs. See below for your results today.   Medications prescribed:  Lidocaine patches  Take any prescribed medications only as directed.  Home care instructions:  Follow any educational materials contained in this packet.  BE VERY CAREFUL not to take multiple medicines containing Tylenol (also called acetaminophen). Doing so can lead to an overdose which can damage your liver and cause liver failure and possibly death.   Follow-up instructions: Please follow-up with your primary care provider in the next 7 days for further evaluation of your symptoms.   Return instructions:  Please return to the Emergency Department if you experience worsening symptoms.  Please return if you have any other emergent concerns.  Additional Information:  Your vital signs today were: BP 127/60 (BP Location: Right Arm)   Pulse 77   Temp 98.7 F (37.1 C) (Oral)   Resp 20   Ht 5\' 7"  (1.702 m)   Wt 82.7 kg   SpO2 100%   BMI 28.56 kg/m  If your blood pressure (BP) was elevated above 135/85 this visit, please have this repeated by your doctor within one month. --------------

## 2022-05-31 NOTE — ED Provider Notes (Signed)
[facility] Provider Note   CSN: [id number] Arrival date & time: 05/31/22  1510     History  Chief Complaint  Patient presents with   Molly Cobb    Molly Cobb is a 88 y.o. female.  Patient presents emerged department today for evaluation of bilateral buttocks pain that has been ongoing since a fall 2 weeks ago.  Patient has been using a topical medication without improvement.  She is able to ambulate, pain is worse with sitting and putting weight on the areas.  Pain has not been improving.  No weakness, numbness, or tingling in the lower extremities.  No abdominal pain.       Home Medications Prior to Admission medications   Medication Sig Start Date End Date Taking? Authorizing Provider  amLODipine (NORVASC) 10 MG tablet Take 1 tablet (10 mg total) by mouth every morning. 10/02/21   Medina-Vargas, Monina C, NP  Apoaequorin (PREVAGEN PO) Take 1 capsule by mouth every morning.    [provider]  Ascorbic Acid (VITAMIN C PO) Take 1 tablet by mouth every morning.    [provider]  aspirin 81 MG chewable tablet Chew 1 tablet (81 mg total) by mouth daily. 02/25/17   Cheryln Manly, NP  atorvastatin (LIPITOR) 40 MG tablet Take 1 tablet (40 mg total) by mouth every morning. 10/01/21   Medina-Vargas, Monina C, NP  B Complex Vitamins (VITAMIN B COMPLEX PO) Take 1 capsule by mouth every morning.    [provider]  bisacodyl (DULCOLAX) 10 MG suppository If not relieved by MOM, give 10 mg Bisacodyl suppositiory rectally X 1 dose in 24 hours as needed    [provider]  Coenzyme Q10 (COQ-10 PO) Take 1 capsule by mouth every morning.    [provider]  conjugated estrogens (PREMARIN) vaginal cream Place 1 Applicatorful vaginally every Monday, Wednesday, and Friday. 10/02/21   Medina-Vargas, Monina C, NP  escitalopram (LEXAPRO) 10 MG tablet Take 1 tablet (10 mg total) by mouth every morning. 10/01/21   Medina-Vargas, Monina  C, NP  ezetimibe (ZETIA) 10 MG tablet Take 1 tablet (10 mg total) by mouth every morning. 10/01/21   Medina-Vargas, Monina C, NP  hydrOXYzine (ATARAX) 10 MG tablet Take 1 tablet (10 mg total) by mouth 2 (two) times daily as needed for anxiety. 10/01/21   Medina-Vargas, Monina C, NP  isosorbide mononitrate (IMDUR) 30 MG 24 hr tablet Take 1 tablet (30 mg total) by mouth every morning. 10/01/21   Medina-Vargas, Monina C, NP  levothyroxine (SYNTHROID) 88 MCG tablet Take 1 tablet (88 mcg total) by mouth daily before breakfast. 10/01/21   Medina-Vargas, Monina C, NP  lidocaine (LIDODERM) 5 % Place 1 patch onto the skin daily as needed (pain). Remove & Discard patch within 12 hours or as directed by MD    [provider]  Magnesium Hydroxide (MILK OF MAGNESIA PO) Take by mouth. If no BM in 3 days, give 30 cc Milk of Magnesium p.o. x 1 dose in 24 hours as needed    [provider]  metoprolol tartrate (LOPRESSOR) 25 MG tablet TAKE 1/2 (ONE-HALF) TABLET BY MOUTH TWICE DAILY 10/01/21   Medina-Vargas, Monina C, NP  nitroGLYCERIN (NITROSTAT) 0.4 MG SL tablet Place 1 tablet (0.4 mg total) under the tongue every 5 (five) minutes as needed for chest pain. 10/01/21   Medina-Vargas, Senaida Lange, NP  NON FORMULARY Diet:Heart Healthy/CCD/1200 fluid restriction    [provider]  OYSTER SHELL CALCIUM PO Take  by mouth. 500 MG TB; TAKE 1 TABLET BY MOUTH ONCE DAILY FOR SUPPLEMENT    [provider]  pantoprazole (PROTONIX) 40 MG tablet Take 1 tablet (40 mg total) by mouth daily. 10/01/21   Medina-Vargas, Monina C, NP  sodium bicarbonate 650 MG tablet Take 1 tablet (650 mg total) by mouth every morning. 10/01/21   Medina-Vargas, Monina C, NP  Sodium Phosphates (RA SALINE ENEMA RE) If not relieved by Biscodyl suppository, give disposable Saline Enema rectally X 1 dose/24 hrs as needed    [provider]  TRADJENTA 5 MG TABS tablet Take 1 tablet (5 mg total) by mouth every morning. 10/01/21    Medina-Vargas, Monina C, NP  VITAMIN D PO Take 1 tablet by mouth See admin instructions. Take 1 tablet by mouth every other night    [provider]      Allergies    Oxybutynin, Oysters [shellfish allergy], and Sulfa antibiotics    Review of Systems   Review of Systems  Physical Exam Updated Vital Signs BP 127/60 (BP Location: Right Arm)   Pulse 77   Temp 98.7 F (37.1 C) (Oral)   Resp 20   Ht 5\' 7"  (1.702 m)   Wt 82.7 kg   SpO2 100%   BMI 28.56 kg/m   Physical Exam Vitals and nursing note reviewed.  Constitutional:      Appearance: She is well-developed.  HENT:     Head: Normocephalic and atraumatic.  Eyes:     Conjunctiva/sclera: Conjunctivae normal.  Pulmonary:     Effort: No respiratory distress.  Musculoskeletal:     Cervical back: Normal range of motion and neck supple.     Right hip: No tenderness. Normal range of motion.     Left hip: No tenderness. Normal range of motion.     Comments: Patient with tenderness over the posterior pelvis, more lateral bilaterally the midline, over the ischial areas.  Skin:    General: Skin is warm and dry.  Neurological:     Mental Status: She is alert.     ED Results / Procedures / Treatments   Labs (all labs ordered are listed, but only abnormal results are displayed) Labs Reviewed - No data to display  EKG None  Radiology CT PELVIS WO CONTRAST  Result Date: 05/31/2022 CLINICAL DATA:  Pelvis pain, stress fracture suspected, neg xray Fall about 2 weeks ago, continues with pain in the buttocks bilaterally, eval for pelvic fractures EXAM: CT PELVIS WITHOUT CONTRAST TECHNIQUE: Multidetector CT imaging of the pelvis was performed following the standard protocol without intravenous contrast. RADIATION DOSE REDUCTION: This exam was performed according to the departmental dose-optimization program which includes automated exposure control, adjustment of the mA and/or kV according to patient size and/or use of  iterative reconstruction technique. COMPARISON:  Sacral radiograph earlier today. Abdominopelvic CT 09/21/2021 FINDINGS: Urinary Tract:  Unremarkable urinary bladder. Bowel: Sigmoid diverticulosis. No diverticulitis or acute bowel inflammation. Vascular/Lymphatic: Moderate aorto bi-iliac atherosclerosis. There is scattered calcified mesenteric lymph nodes throughout the mesentery this is chronic and stable from prior exam. Reproductive: Hysterectomy. Quiescent ovaries tentatively visualized. No adnexal mass. Other:  No pelvic free fluid or hematoma. Musculoskeletal: Left inferior pubic ramus fracture has surrounding callus formation, subacute, but new from March exam. Possible nondisplaced coccygeal fracture, series 4, image 93. No definite CT findings of sacral insufficiency fracture. Right femoral intramedullary nail with trans trochanteric screw fixation, remote proximal femur fracture. Mild bilateral hip joint space narrowing. No acute proximal femur fracture. The  pubic symphysis and sacroiliac joints are congruent. Mild subcutaneous edema posteriorly. No confluent soft tissue hematoma. IMPRESSION: 1. Possible nondisplaced coccygeal fracture. 2. Left inferior pubic ramus fracture has surrounding callus formation. This is age indeterminate, callus formation indicates this is subacute, but new from March exam. 3. ORIF of remote proximal right femur fracture. Aortic Atherosclerosis (ICD10-I70.0). Electronically Signed   By: Keith Rake M.D.   On: 05/31/2022 19:39   DG Sacrum/Coccyx  Result Date: 05/31/2022 CLINICAL DATA:  Buttock pain after a fall 2 weeks ago. EXAM: SACRUM AND COCCYX - 2+ VIEW COMPARISON:  CT abdomen and pelvis 09/21/2021 FINDINGS: No acute fracture or pelvic diastasis is identified. There are degenerative changes of the SI joints and lumbar spine, including advanced disc space narrowing at L2-3. Coarse calcifications projecting over the central abdomen correspond to mesenteric  calcifications on CT. Prior right femoral ORIF and atherosclerotic vascular calcifications are noted. IMPRESSION: No acute osseous abnormality identified. Electronically Signed   By: Logan Bores M.D.   On: 05/31/2022 16:04    Procedures Procedures    Medications Ordered in ED Medications  lidocaine (LIDODERM) 5 % 1 patch (has no administration in time range)    ED Course/ Medical Decision Making/ A&P    Patient seen and examined. History obtained directly from patient.   Labs/EKG: None ordered  Imaging: Personally reviewed and interpreted sacral and coccygeal x-ray.  Agree no obvious fractures.  Given duration of pain and age, will obtain CT imaging to evaluate for any occult pelvic fractures.  Medications/Fluids: None ordered.    Most recent vital signs reviewed and are as follows: BP 127/60 (BP Location: Right Arm)   Pulse 77   Temp 98.7 F (37.1 C) (Oral)   Resp 20   Ht 5\' 7"  (1.702 m)   Wt 82.7 kg   SpO2 100%   BMI 28.56 kg/m   Initial impression: Buttocks and posterior pelvis pain after fall  8:16 PM Reassessment performed. Patient appears stable.  Lying in the exam bed.  Imaging personally visualized and interpreted including: Agree with fractures noted on CT imaging report  Reviewed pertinent lab work and imaging with patient at bedside. Questions answered.   Most current vital signs reviewed and are as follows: BP 127/60 (BP Location: Right Arm)   Pulse 77   Temp 98.7 F (37.1 C) (Oral)   Resp 20   Ht 5\' 7"  (1.702 m)   Wt 82.7 kg   SpO2 100%   BMI 28.56 kg/m   Plan: Discharge to home.   Prescriptions written for: Lidoderm patches  Other home care instructions discussed: Cushioning, rest.  Discussed typical course of healing, gradual improvement of pain over several weeks.  ED return instructions discussed: Uncontrolled pain, new symptoms or other concerns  Follow-up instructions discussed: Patient encouraged to follow-up with their PCP in 7 days.                               Medical Decision Making Amount and/or Complexity of Data Reviewed Radiology: ordered.  Risk Prescription drug management.   Patient with pain after a fall several weeks ago.  This is in the bilateral buttocks area.  X-ray negative but persistent concern for pelvic fracture.  This shows an old healing left inferior pubic ramus fracture, unclear if this was related to her most recent fall or not but is new since March.  Patient is otherwise neurovascularly intact.  Pain is controlled.  She is ambulatory.  She has supportive family.  No indication for admission at this time.  The patient's vital signs, pertinent lab work and imaging were reviewed and interpreted as discussed in the ED course. Hospitalization was considered for further testing, treatments, or serial exams/observation. However as patient is well-appearing, has a stable exam, and reassuring studies today, I do not feel that they warrant admission at this time. This plan was discussed with the patient who verbalizes agreement and comfort with this plan and seems reliable and able to return to the Emergency Department with worsening or changing symptoms.          Final Clinical Impression(s) / ED Diagnoses Final diagnoses:  Bilateral buttock pain  Fracture of coccyx, initial encounter for closed fracture [facility])  Closed fracture of left inferior pubic ramus, initial encounter [facility])    Rx / DC Orders ED Discharge Orders          Ordered    lidocaine (LIDODERM) 5 %  Every 24 hours        05/31/22 2014              Carlisle Cater, Hershal Coria 05/31/22 2018    Regan Lemming, MD 05/31/22 2131

## 2022-08-07 ENCOUNTER — Encounter (HOSPITAL_BASED_OUTPATIENT_CLINIC_OR_DEPARTMENT_OTHER): Payer: Self-pay | Admitting: *Deleted

## 2022-08-07 ENCOUNTER — Emergency Department (HOSPITAL_BASED_OUTPATIENT_CLINIC_OR_DEPARTMENT_OTHER)
Admission: EM | Admit: 2022-08-07 | Discharge: 2022-08-08 | Disposition: A | Discharge status: Home / Self Care | Payer: Medicare Other | Attending: Emergency Medicine | Admitting: Emergency Medicine

## 2022-08-07 ENCOUNTER — Other Ambulatory Visit: Payer: Self-pay

## 2022-08-07 ENCOUNTER — Emergency Department (HOSPITAL_BASED_OUTPATIENT_CLINIC_OR_DEPARTMENT_OTHER): Payer: Medicare Other

## 2022-08-07 DIAGNOSIS — E1122 Type 2 diabetes mellitus with diabetic chronic kidney disease: Secondary | ICD-10-CM | POA: Insufficient documentation

## 2022-08-07 DIAGNOSIS — Z992 Dependence on renal dialysis: Secondary | ICD-10-CM | POA: Diagnosis not present

## 2022-08-07 DIAGNOSIS — Z7982 Long term (current) use of aspirin: Secondary | ICD-10-CM | POA: Insufficient documentation

## 2022-08-07 DIAGNOSIS — I2510 Atherosclerotic heart disease of native coronary artery without angina pectoris: Secondary | ICD-10-CM | POA: Insufficient documentation

## 2022-08-07 DIAGNOSIS — U071 COVID-19: Secondary | ICD-10-CM | POA: Insufficient documentation

## 2022-08-07 DIAGNOSIS — N185 Chronic kidney disease, stage 5: Secondary | ICD-10-CM | POA: Insufficient documentation

## 2022-08-07 DIAGNOSIS — R0602 Shortness of breath: Secondary | ICD-10-CM | POA: Diagnosis present

## 2022-08-07 DIAGNOSIS — N189 Chronic kidney disease, unspecified: Secondary | ICD-10-CM

## 2022-08-07 LAB — CBC WITH DIFFERENTIAL/PLATELET
Abs Immature Granulocytes: 0.07 10*3/uL (ref 0.00–0.07)
Basophils Absolute: 0 10*3/uL (ref 0.0–0.1)
Basophils Relative: 0 %
Eosinophils Absolute: 0.1 10*3/uL (ref 0.0–0.5)
Eosinophils Relative: 0 %
HCT: 31.4 % — ABNORMAL LOW (ref 36.0–46.0)
Hemoglobin: 10.2 g/dL — ABNORMAL LOW (ref 12.0–15.0)
Immature Granulocytes: 1 %
Lymphocytes Relative: 7 %
Lymphs Abs: 1 10*3/uL (ref 0.7–4.0)
MCH: 28.5 pg (ref 26.0–34.0)
MCHC: 32.5 g/dL (ref 30.0–36.0)
MCV: 87.7 fL (ref 80.0–100.0)
Monocytes Absolute: 1.2 10*3/uL — ABNORMAL HIGH (ref 0.1–1.0)
Monocytes Relative: 8 %
Neutro Abs: 11.4 10*3/uL — ABNORMAL HIGH (ref 1.7–7.7)
Neutrophils Relative %: 84 %
Platelets: 185 10*3/uL (ref 150–400)
RBC: 3.58 MIL/uL — ABNORMAL LOW (ref 3.87–5.11)
RDW: 15.6 % — ABNORMAL HIGH (ref 11.5–15.5)
WBC: 13.7 10*3/uL — ABNORMAL HIGH (ref 4.0–10.5)
nRBC: 0 % (ref 0.0–0.2)

## 2022-08-07 LAB — COMPREHENSIVE METABOLIC PANEL
ALT: 15 U/L (ref 0–44)
AST: 31 U/L (ref 15–41)
Albumin: 3.6 g/dL (ref 3.5–5.0)
Alkaline Phosphatase: 97 U/L (ref 38–126)
Anion gap: 9 (ref 5–15)
BUN: 59 mg/dL — ABNORMAL HIGH (ref 8–23)
CO2: 22 mmol/L (ref 22–32)
Calcium: 8.5 mg/dL — ABNORMAL LOW (ref 8.9–10.3)
Chloride: 104 mmol/L (ref 98–111)
Creatinine, Ser: 3.75 mg/dL — ABNORMAL HIGH (ref 0.44–1.00)
GFR, Estimated: 11 mL/min — ABNORMAL LOW (ref >60)
Glucose, Bld: 304 mg/dL — ABNORMAL HIGH (ref 70–99)
Potassium: 5.7 mmol/L — ABNORMAL HIGH (ref 3.5–5.1)
Sodium: 135 mmol/L (ref 135–145)
Total Bilirubin: 0.7 mg/dL (ref 0.3–1.2)
Total Protein: 7 g/dL (ref 6.5–8.1)

## 2022-08-07 LAB — RESP PANEL BY RT-PCR (RSV, FLU A&B, COVID)  RVPGX2
Influenza A by PCR: NEGATIVE
Influenza B by PCR: NEGATIVE
Resp Syncytial Virus by PCR: NEGATIVE
SARS Coronavirus 2 by RT PCR: POSITIVE — AB

## 2022-08-07 LAB — BRAIN NATRIURETIC PEPTIDE: B Natriuretic Peptide: 127.8 pg/mL — ABNORMAL HIGH (ref 0.0–100.0)

## 2022-08-07 LAB — TROPONIN I (HIGH SENSITIVITY): Troponin I (High Sensitivity): 14 ng/L (ref <18)

## 2022-08-07 MED ORDER — ACETAMINOPHEN 325 MG PO TABS
650.0000 mg | ORAL_TABLET | Freq: Once | ORAL | Status: AC
  Administered 2022-08-07: 650 mg via ORAL
  Filled 2022-08-07: qty 2

## 2022-08-07 MED ORDER — MOLNUPIRAVIR EUA 200MG CAPSULE: 4.0000 | ORAL_CAPSULE | Freq: Two times a day (BID) | ORAL | 0 refills | Status: AC

## 2022-08-07 MED ORDER — SODIUM ZIRCONIUM CYCLOSILICATE 10 G PO PACK
10.0000 g | PACK | Freq: Once | ORAL | Status: AC
  Administered 2022-08-07: 10 g via ORAL
  Filled 2022-08-07: qty 1

## 2022-08-07 MED ORDER — SODIUM CHLORIDE 0.9 % IV BOLUS: 500.0000 mL | Freq: Once | INTRAVENOUS | Status: DC

## 2022-08-07 NOTE — ED Provider Notes (Signed)
  Provider Note MRN:  [id number]  Arrival date & time: 08/08/22    ED Course and Medical Decision Making  Assumed care from Dr. Annamaria Boots at shift change.  Presenting with chills and mild dyspnea, has tested positive for COVID-19.  History of CKD, potassium 5.7 here in the emergency department.  Question hemolyzed sample.  Obtaining repeat troponin and repeat BMP, providing Lokelma.  If repeat labs are reassuring patient would be appropriate for discharge.  Patient and family aware of plan.  12:50 AM update: Second troponin is negative, repeat BMP is reassuring.  Appropriate for discharge.  Procedures  Final Clinical Impressions(s) / ED Diagnoses     ICD-10-CM   1. COVID-19  U07.1     2. Chronic kidney disease, unspecified CKD stage  N18.9       ED Discharge Orders          Ordered    molnupiravir EUA (LAGEVRIO) 200 mg CAPS capsule  2 times daily        08/07/22 2320              Discharge Instructions      Take molnupiravir as prescribed for COVID.   See your doctor   Return to ER if you have worse chest pain or shortness of breath.     Barth Kirks. Sedonia Small, [place] [email or web address]    Maudie Flakes, MD 08/08/22 [phone]

## 2022-08-07 NOTE — Discharge Instructions (Addendum)
Take molnupiravir as prescribed for COVID.   See your doctor   Return to ER if you have worse chest pain or shortness of breath.

## 2022-08-07 NOTE — ED Notes (Signed)
Patient's right elbow is sore from prior fall.  She has bruising and swelling to the area.  She reports this was evaluated the last time she was in the hospital.  Patient reports she has had 5 falls since November.

## 2022-08-07 NOTE — ED Notes (Signed)
Patient is resting.  She requested to contact her daughter.  Message left.

## 2022-08-07 NOTE — ED Provider Notes (Signed)
[facility] [place] Provider Note   CSN: [id number] Arrival date & time: 08/07/22  2151     History  Chief Complaint  Patient presents with   Shortness of Breath   Cough    Molly Cobb is a 89 y.o. female history of CAD, stage V CKD but not on dialysis, diabetes, pacemaker for sick sinus syndrome, here presenting with cough and chills and shortness of breath.  Patient resides at [facility] independent [facility].  Patient states that 2 hours prior to arrival, she has been having some chills and nonproductive cough.  She has some subjective shortness of breath and chest pressure as well.  She denies any sick contacts.  Of note patient was seen at [facility] about 2 weeks ago for chest pain and had stable troponins of around 20.   The history is provided by the patient.       Home Medications Prior to Admission medications   Medication Sig Start Date End Date Taking? Authorizing Provider  amLODipine (NORVASC) 10 MG tablet Take 1 tablet (10 mg total) by mouth every morning. 10/02/21   Medina-Vargas, Monina C, NP  Apoaequorin (PREVAGEN PO) Take 1 capsule by mouth every morning.    [provider]  Ascorbic Acid (VITAMIN C PO) Take 1 tablet by mouth every morning.    [provider]  aspirin 81 MG chewable tablet Chew 1 tablet (81 mg total) by mouth daily. 02/25/17   Cheryln Manly, NP  atorvastatin (LIPITOR) 40 MG tablet Take 1 tablet (40 mg total) by mouth every morning. 10/01/21   Medina-Vargas, Monina C, NP  B Complex Vitamins (VITAMIN B COMPLEX PO) Take 1 capsule by mouth every morning.    [provider]  bisacodyl (DULCOLAX) 10 MG suppository If not relieved by MOM, give 10 mg Bisacodyl suppositiory rectally X 1 dose in 24 hours as needed    [provider]  Coenzyme Q10 (COQ-10 PO) Take 1 capsule by mouth every morning.    [provider]  conjugated estrogens (PREMARIN) vaginal cream Place 1  Applicatorful vaginally every Monday, Wednesday, and Friday. 10/02/21   Medina-Vargas, Monina C, NP  escitalopram (LEXAPRO) 10 MG tablet Take 1 tablet (10 mg total) by mouth every morning. 10/01/21   Medina-Vargas, Monina C, NP  ezetimibe (ZETIA) 10 MG tablet Take 1 tablet (10 mg total) by mouth every morning. 10/01/21   Medina-Vargas, Monina C, NP  hydrOXYzine (ATARAX) 10 MG tablet Take 1 tablet (10 mg total) by mouth 2 (two) times daily as needed for anxiety. 10/01/21   Medina-Vargas, Monina C, NP  isosorbide mononitrate (IMDUR) 30 MG 24 hr tablet Take 1 tablet (30 mg total) by mouth every morning. 10/01/21   Medina-Vargas, Monina C, NP  levothyroxine (SYNTHROID) 88 MCG tablet Take 1 tablet (88 mcg total) by mouth daily before breakfast. 10/01/21   Medina-Vargas, Monina C, NP  lidocaine (LIDODERM) 5 % Place 1 patch onto the skin daily as needed (pain). Remove & Discard patch within 12 hours or as directed by MD    [provider]  lidocaine (LIDODERM) 5 % Place 1 patch onto the skin daily. Remove & Discard patch within 12 hours or as directed by MD 05/31/22   Carlisle Cater, PA-C  Magnesium Hydroxide (MILK OF MAGNESIA PO) Take by mouth. If no BM in 3 days, give 30 cc Milk of Magnesium p.o. x 1 dose in 24 hours as needed    [provider]  metoprolol tartrate (LOPRESSOR) 25 MG tablet TAKE 1/2 (ONE-HALF) TABLET BY MOUTH TWICE DAILY 10/01/21   Medina-Vargas, Monina C, NP  nitroGLYCERIN (NITROSTAT) 0.4 MG SL tablet Place 1 tablet (0.4 mg total) under the tongue every 5 (five) minutes as needed for chest pain. 10/01/21   Medina-Vargas, Senaida Lange, NP  NON FORMULARY Diet:Heart Healthy/CCD/1200 fluid restriction    [provider]  OYSTER SHELL CALCIUM PO Take by mouth. 500 MG TB; TAKE 1 TABLET BY MOUTH ONCE DAILY FOR SUPPLEMENT    [provider]  pantoprazole (PROTONIX) 40 MG tablet Take 1 tablet (40 mg total) by mouth daily. 10/01/21   Medina-Vargas, Monina C, NP  sodium bicarbonate 650  MG tablet Take 1 tablet (650 mg total) by mouth every morning. 10/01/21   Medina-Vargas, Monina C, NP  Sodium Phosphates (RA SALINE ENEMA RE) If not relieved by Biscodyl suppository, give disposable Saline Enema rectally X 1 dose/24 hrs as needed    [provider]  TRADJENTA 5 MG TABS tablet Take 1 tablet (5 mg total) by mouth every morning. 10/01/21   Medina-Vargas, Monina C, NP  VITAMIN D PO Take 1 tablet by mouth See admin instructions. Take 1 tablet by mouth every other night    [provider]      Allergies    Oxybutynin, Oysters [shellfish allergy], and Sulfa antibiotics    Review of Systems   Review of Systems  Respiratory:  Positive for shortness of breath.   Cardiovascular:  Positive for chest pain.  All other systems reviewed and are negative.   Physical Exam Updated Vital Signs BP (!) 160/47   Pulse 74   Temp 100.1 F (37.8 C) (Oral)   Resp 15   Ht 5' 6"$  (1.676 m)   Wt 70.8 kg   SpO2 100%   BMI 25.18 kg/m  Physical Exam Vitals and nursing note reviewed.  Constitutional:      Comments: Chronically ill and slightly tachypneic  HENT:     Head: Normocephalic.  Cardiovascular:     Rate and Rhythm: Normal rate and regular rhythm.  Pulmonary:     Comments: Crackles on the left base. Abdominal:     General: Bowel sounds are normal.     Palpations: Abdomen is soft.  Musculoskeletal:        General: Normal range of motion.     Cervical back: Normal range of motion and neck supple.  Skin:    General: Skin is warm.     Capillary Refill: Capillary refill takes less than 2 seconds.  Neurological:     General: No focal deficit present.     Mental Status: She is oriented to person, place, and time.  Psychiatric:        Mood and Affect: Mood normal.        Behavior: Behavior normal.     ED Results / Procedures / Treatments   Labs (all labs ordered are listed, but only abnormal results are displayed) Labs Reviewed  CBC WITH DIFFERENTIAL/PLATELET -  Abnormal; Notable for the following components:      Result Value   WBC 13.7 (*)    RBC 3.58 (*)    Hemoglobin 10.2 (*)    HCT 31.4 (*)    RDW 15.6 (*)    Neutro Abs 11.4 (*)    Monocytes Absolute 1.2 (*)    All other components within normal limits  COMPREHENSIVE METABOLIC PANEL - Abnormal; Notable for the following components:   Potassium 5.7 (*)  Glucose, Bld 304 (*)    BUN 59 (*)    Creatinine, Ser 3.75 (*)    Calcium 8.5 (*)    GFR, Estimated 11 (*)    All other components within normal limits  RESP PANEL BY RT-PCR (RSV, FLU A&B, COVID)  [id number]  BRAIN NATRIURETIC PEPTIDE  TROPONIN I (HIGH SENSITIVITY)    EKG EKG Interpretation  Date/Time:  Saturday August 07 2022 22:11:06 EST Ventricular Rate:  72 PR Interval:  217 QRS Duration: 153 QT Interval:  438 QTC Calculation: 480 R Axis:   -14 Text Interpretation: Sinus rhythm Borderline prolonged PR interval Left bundle branch block No significant change since last tracing Confirmed by Wandra Arthurs [phone]) on 08/07/2022 10:12:26 PM  Radiology No results found.  Procedures Procedures    Medications Ordered in ED Medications  acetaminophen (TYLENOL) tablet 650 mg (has no administration in time range)    ED Course/ Medical Decision Making/ A&P                             Medical Decision Making Zarian Facey is a 89 y.o. female who presented with cough and chills and subjective shortness of breath and chest pain.  I think likely flu or COVID or pneumonia.  She does have history of CAD so consider ACS as well.  Also consider worsening renal failure.  Plan to get CBC and CMP and BNP and troponin x 2 and chest x-ray and COVID and flu and RSV test  11:25 PM Patient's potassium is 5.7 and creatinine is 3.7 which is baseline.  Patient is COVID-positive and has no oxygen requirement.  Patient is high risk for complications prescribed molnupiravir.  Patient also given Lokelma.  Signed out pending repeat BMP and troponin.  If  stable, anticipate that patient will be discharged on molnupiravir.  I have updated patient and family. Signed out to Dr. Sedonia Small.   Amount and/or Complexity of Data Reviewed Labs: ordered. Radiology: ordered.  Risk OTC drugs. Prescription drug management.    Final Clinical Impression(s) / ED Diagnoses Final diagnoses:  None    Rx / DC Orders ED Discharge Orders     None         Drenda Freeze, MD 08/07/22 2326

## 2022-08-07 NOTE — ED Triage Notes (Addendum)
Patient reports onset of cough and sob tonight approx 2 hours ago.  She felt warm to touch.  Bp 140/58, HR 70's.  RR 18, pulse ox 98%.  CBG is 363.  Patient arrives alert and oriented.  She lives at the [facility] independent living.  She walks with walker.

## 2022-08-08 LAB — BASIC METABOLIC PANEL
Anion gap: 8 (ref 5–15)
BUN: 57 mg/dL — ABNORMAL HIGH (ref 8–23)
CO2: 23 mmol/L (ref 22–32)
Calcium: 8.4 mg/dL — ABNORMAL LOW (ref 8.9–10.3)
Chloride: 104 mmol/L (ref 98–111)
Creatinine, Ser: 3.62 mg/dL — ABNORMAL HIGH (ref 0.44–1.00)
GFR, Estimated: 12 mL/min — ABNORMAL LOW (ref >60)
Glucose, Bld: 242 mg/dL — ABNORMAL HIGH (ref 70–99)
Potassium: 4.6 mmol/L (ref 3.5–5.1)
Sodium: 135 mmol/L (ref 135–145)

## 2022-08-08 LAB — TROPONIN I (HIGH SENSITIVITY): Troponin I (High Sensitivity): 14 ng/L (ref <18)

## 2022-08-08 NOTE — ED Notes (Signed)
Reviewed discharge instructions with patient and with her family.  They verbalized understanding of reasons to return to the ED and quarantine for Covid.  Patient is being transported home by daughter.  She has her fall pendant and her key for her home with her.

## 2022-10-26 ENCOUNTER — Emergency Department (HOSPITAL_BASED_OUTPATIENT_CLINIC_OR_DEPARTMENT_OTHER): Payer: Medicare Other

## 2022-10-26 ENCOUNTER — Emergency Department (HOSPITAL_BASED_OUTPATIENT_CLINIC_OR_DEPARTMENT_OTHER)
Admission: EM | Admit: 2022-10-26 | Discharge: 2022-10-27 | Disposition: A | Discharge status: Home / Self Care | Payer: Medicare Other | Attending: Emergency Medicine | Admitting: Emergency Medicine

## 2022-10-26 ENCOUNTER — Other Ambulatory Visit: Payer: Self-pay

## 2022-10-26 ENCOUNTER — Encounter (HOSPITAL_BASED_OUTPATIENT_CLINIC_OR_DEPARTMENT_OTHER): Payer: Self-pay | Admitting: Pediatrics

## 2022-10-26 DIAGNOSIS — R739 Hyperglycemia, unspecified: Secondary | ICD-10-CM

## 2022-10-26 DIAGNOSIS — E039 Hypothyroidism, unspecified: Secondary | ICD-10-CM | POA: Diagnosis not present

## 2022-10-26 DIAGNOSIS — E1122 Type 2 diabetes mellitus with diabetic chronic kidney disease: Secondary | ICD-10-CM | POA: Insufficient documentation

## 2022-10-26 DIAGNOSIS — Z79890 Hormone replacement therapy (postmenopausal): Secondary | ICD-10-CM | POA: Diagnosis not present

## 2022-10-26 DIAGNOSIS — E1165 Type 2 diabetes mellitus with hyperglycemia: Secondary | ICD-10-CM | POA: Insufficient documentation

## 2022-10-26 DIAGNOSIS — Z79899 Other long term (current) drug therapy: Secondary | ICD-10-CM | POA: Diagnosis not present

## 2022-10-26 DIAGNOSIS — N189 Chronic kidney disease, unspecified: Secondary | ICD-10-CM | POA: Diagnosis not present

## 2022-10-26 DIAGNOSIS — I2510 Atherosclerotic heart disease of native coronary artery without angina pectoris: Secondary | ICD-10-CM | POA: Insufficient documentation

## 2022-10-26 DIAGNOSIS — I6782 Cerebral ischemia: Secondary | ICD-10-CM | POA: Diagnosis not present

## 2022-10-26 DIAGNOSIS — I129 Hypertensive chronic kidney disease with stage 1 through stage 4 chronic kidney disease, or unspecified chronic kidney disease: Secondary | ICD-10-CM | POA: Insufficient documentation

## 2022-10-26 DIAGNOSIS — Z7982 Long term (current) use of aspirin: Secondary | ICD-10-CM | POA: Insufficient documentation

## 2022-10-26 DIAGNOSIS — Z950 Presence of cardiac pacemaker: Secondary | ICD-10-CM | POA: Insufficient documentation

## 2022-10-26 LAB — BASIC METABOLIC PANEL
Anion gap: 11 (ref 5–15)
BUN: 58 mg/dL — ABNORMAL HIGH (ref 8–23)
CO2: 22 mmol/L (ref 22–32)
Calcium: 9.2 mg/dL (ref 8.9–10.3)
Chloride: 100 mmol/L (ref 98–111)
Creatinine, Ser: 3.13 mg/dL — ABNORMAL HIGH (ref 0.44–1.00)
GFR, Estimated: 14 mL/min — ABNORMAL LOW (ref >60)
Glucose, Bld: 259 mg/dL — ABNORMAL HIGH (ref 70–99)
Potassium: 5.1 mmol/L (ref 3.5–5.1)
Sodium: 133 mmol/L — ABNORMAL LOW (ref 135–145)

## 2022-10-26 LAB — URINALYSIS, ROUTINE W REFLEX MICROSCOPIC
Bilirubin Urine: NEGATIVE
Glucose, UA: 250 mg/dL — AB
Hgb urine dipstick: NEGATIVE
Ketones, ur: NEGATIVE mg/dL
Nitrite: NEGATIVE
Protein, ur: 100 mg/dL — AB
Specific Gravity, Urine: 1.015 (ref 1.005–1.030)
pH: 5.5 (ref 5.0–8.0)

## 2022-10-26 LAB — CBC
HCT: 32.3 % — ABNORMAL LOW (ref 36.0–46.0)
Hemoglobin: 10.5 g/dL — ABNORMAL LOW (ref 12.0–15.0)
MCH: 28.8 pg (ref 26.0–34.0)
MCHC: 32.5 g/dL (ref 30.0–36.0)
MCV: 88.5 fL (ref 80.0–100.0)
Platelets: 203 10*3/uL (ref 150–400)
RBC: 3.65 MIL/uL — ABNORMAL LOW (ref 3.87–5.11)
RDW: 15.8 % — ABNORMAL HIGH (ref 11.5–15.5)
WBC: 9.8 10*3/uL (ref 4.0–10.5)
nRBC: 0.2 % (ref 0.0–0.2)

## 2022-10-26 LAB — URINALYSIS, MICROSCOPIC (REFLEX)

## 2022-10-26 LAB — CBG MONITORING, ED
Glucose-Capillary: 234 mg/dL — ABNORMAL HIGH (ref 70–99)
Glucose-Capillary: 181 mg/dL — ABNORMAL HIGH (ref 70–99)

## 2022-10-26 NOTE — ED Triage Notes (Signed)
Daughter at bedside reports she was notified by [place] of episode of confusion and found to have blood sugar of 300+  Daughter stated patient is in early stage of Dementia and CKD V

## 2022-10-26 NOTE — ED Provider Notes (Addendum)
[facility] Provider Note   CSN: [id number] Arrival date & time: 10/26/22  4098     History  Chief Complaint  Patient presents with   Hyperglycemia    Molly Cobb is a 89 y.o. female.  The history is provided by the patient and a relative.  Hyperglycemia Blood sugar level PTA:  302 Severity:  Moderate Onset quality:  Gradual Duration:  1 day Timing:  Constant Progression:  Unchanged Chronicity:  New Current diabetic treatments: glp1. Time since last antidiabetic medication:  1 day Context: not change in medication   Relieved by:  Nothing Ineffective treatments:  None tried Associated symptoms: no abdominal pain, no chest pain, no increased thirst, no shortness of breath, no vomiting and no weakness   Risk factors: no pancreatic disease   Patient with DM and memory disease presents with elevated sugar and memory issues.  No fevers, no chest pain.      Past Medical History:  Diagnosis Date   CKD (chronic kidney disease)    a. followed by Dr. Janit Pagan at [facility]    Coronary artery disease    02/23/17 PCI/DES to mRCA,  normal EF   Depression    Diabetes mellitus (HCC)    Gout    Hypertension    Hypothyroidism    Presence of permanent cardiac pacemaker    a. followed by [facility]   S/P TAVR (transcatheter aortic valve replacement) 04/12/2017   23 mm Edwards Sapien 3 transcatheter heart valve placed via percutaneous right transfemoral approach    Severe aortic stenosis    a. diagnosed 01/2017 during admission for chest pain.      Home Medications Prior to Admission medications   Medication Sig Start Date End Date Taking? Authorizing Provider  amLODipine (NORVASC) 10 MG tablet Take 1 tablet (10 mg total) by mouth every morning. 10/02/21   Medina-Vargas, Monina C, NP  Apoaequorin (PREVAGEN PO) Take 1 capsule by mouth every morning.    [provider]  Ascorbic Acid (VITAMIN C PO) Take 1 tablet by mouth every  morning.    [provider]  aspirin 81 MG chewable tablet Chew 1 tablet (81 mg total) by mouth daily. 02/25/17   Arty Baumgartner, NP  atorvastatin (LIPITOR) 40 MG tablet Take 1 tablet (40 mg total) by mouth every morning. 10/01/21   Medina-Vargas, Monina C, NP  B Complex Vitamins (VITAMIN B COMPLEX PO) Take 1 capsule by mouth every morning.    [provider]  bisacodyl (DULCOLAX) 10 MG suppository If not relieved by MOM, give 10 mg Bisacodyl suppositiory rectally X 1 dose in 24 hours as needed    [provider]  Coenzyme Q10 (COQ-10 PO) Take 1 capsule by mouth every morning.    [provider]  conjugated estrogens (PREMARIN) vaginal cream Place 1 Applicatorful vaginally every Monday, Wednesday, and Friday. 10/02/21   Medina-Vargas, Monina C, NP  escitalopram (LEXAPRO) 10 MG tablet Take 1 tablet (10 mg total) by mouth every morning. 10/01/21   Medina-Vargas, Monina C, NP  ezetimibe (ZETIA) 10 MG tablet Take 1 tablet (10 mg total) by mouth every morning. 10/01/21   Medina-Vargas, Monina C, NP  hydrOXYzine (ATARAX) 10 MG tablet Take 1 tablet (10 mg total) by mouth 2 (two) times daily as needed for anxiety. 10/01/21   Medina-Vargas, Monina C, NP  isosorbide mononitrate (IMDUR) 30 MG 24 hr tablet Take 1 tablet (30 mg total) by mouth every morning. 10/01/21   Medina-Vargas,  Monina C, NP  levothyroxine (SYNTHROID) 88 MCG tablet Take 1 tablet (88 mcg total) by mouth daily before breakfast. 10/01/21   Medina-Vargas, Monina C, NP  lidocaine (LIDODERM) 5 % Place 1 patch onto the skin daily as needed (pain). Remove & Discard patch within 12 hours or as directed by MD    [provider]  lidocaine (LIDODERM) 5 % Place 1 patch onto the skin daily. Remove & Discard patch within 12 hours or as directed by MD 05/31/22   Renne Crigler, PA-C  Magnesium Hydroxide (MILK OF MAGNESIA PO) Take by mouth. If no BM in 3 days, give 30 cc Milk of Magnesium p.o. x 1 dose in 24 hours as needed     [provider]  metoprolol tartrate (LOPRESSOR) 25 MG tablet TAKE 1/2 (ONE-HALF) TABLET BY MOUTH TWICE DAILY 10/01/21   Medina-Vargas, Monina C, NP  nitroGLYCERIN (NITROSTAT) 0.4 MG SL tablet Place 1 tablet (0.4 mg total) under the tongue every 5 (five) minutes as needed for chest pain. 10/01/21   Medina-Vargas, Margit Banda, NP  NON FORMULARY Diet:Heart Healthy/CCD/1200 fluid restriction    [provider]  OYSTER SHELL CALCIUM PO Take by mouth. 500 MG TB; TAKE 1 TABLET BY MOUTH ONCE DAILY FOR SUPPLEMENT    [provider]  pantoprazole (PROTONIX) 40 MG tablet Take 1 tablet (40 mg total) by mouth daily. 10/01/21   Medina-Vargas, Monina C, NP  sodium bicarbonate 650 MG tablet Take 1 tablet (650 mg total) by mouth every morning. 10/01/21   Medina-Vargas, Monina C, NP  Sodium Phosphates (RA SALINE ENEMA RE) If not relieved by Biscodyl suppository, give disposable Saline Enema rectally X 1 dose/24 hrs as needed    [provider]  TRADJENTA 5 MG TABS tablet Take 1 tablet (5 mg total) by mouth every morning. 10/01/21   Medina-Vargas, Monina C, NP  VITAMIN D PO Take 1 tablet by mouth See admin instructions. Take 1 tablet by mouth every other night    [provider]      Allergies    Oxybutynin, Oysters [shellfish allergy], and Sulfa antibiotics    Review of Systems   Review of Systems  HENT:  Negative for facial swelling.   Eyes:  Negative for redness.  Respiratory:  Negative for cough, shortness of breath, wheezing and stridor.   Cardiovascular:  Negative for chest pain.  Gastrointestinal:  Negative for abdominal pain and vomiting.  Endocrine: Negative for polydipsia and polyphagia.  Neurological:  Negative for speech difficulty, weakness and numbness.  All other systems reviewed and are negative.   Physical Exam Updated Vital Signs BP (!) 145/57 (BP Location: Right Arm)   Pulse 70   Temp 97.7 F (36.5 C) (Oral)   Resp 14   Ht 5\' 3"  (1.6 m)   Wt 77.1 kg    SpO2 100%   BMI 30.11 kg/m  Physical Exam Vitals and nursing note reviewed.  Constitutional:      General: She is not in acute distress.    Appearance: Normal appearance. She is well-developed.  HENT:     Head: Normocephalic and atraumatic.     Nose: Nose normal.  Eyes:     Pupils: Pupils are equal, round, and reactive to light.  Cardiovascular:     Rate and Rhythm: Normal rate and regular rhythm.     Pulses: Normal pulses.     Heart sounds: Normal heart sounds.  Pulmonary:     Effort: Pulmonary effort is normal. No respiratory distress.  Breath sounds: Normal breath sounds.  Abdominal:     General: Bowel sounds are normal. There is no distension.     Palpations: Abdomen is soft.     Tenderness: There is no abdominal tenderness. There is no guarding or rebound.  Genitourinary:    Vagina: No vaginal discharge.  Musculoskeletal:        General: Normal range of motion.     Cervical back: Neck supple.  Skin:    General: Skin is warm and dry.     Capillary Refill: Capillary refill takes less than 2 seconds.     Findings: No erythema or rash.  Neurological:     General: No focal deficit present.     Mental Status: She is alert and oriented to person, place, and time.     Deep Tendon Reflexes: Reflexes normal.  Psychiatric:        Mood and Affect: Mood normal.        Behavior: Behavior normal.     ED Results / Procedures / Treatments   Labs (all labs ordered are listed, but only abnormal results are displayed) Results for orders placed or performed during the hospital encounter of 10/26/22  Basic metabolic panel  Result Value Ref Range   Sodium 133 (L) 135 - 145 mmol/L   Potassium 5.1 3.5 - 5.1 mmol/L   Chloride 100 98 - 111 mmol/L   CO2 22 22 - 32 mmol/L   Glucose, Bld 259 (H) 70 - 99 mg/dL   BUN 58 (H) 8 - 23 mg/dL   Creatinine, Ser 1.61 (H) 0.44 - 1.00 mg/dL   Calcium 9.2 8.9 - 09.6 mg/dL   GFR, Estimated 14 (L) >60 mL/min   Anion gap 11 5 - 15  CBC   Result Value Ref Range   WBC 9.8 4.0 - 10.5 K/uL   RBC 3.65 (L) 3.87 - 5.11 MIL/uL   Hemoglobin 10.5 (L) 12.0 - 15.0 g/dL   HCT 04.5 (L) 40.9 - 81.1 %   MCV 88.5 80.0 - 100.0 fL   MCH 28.8 26.0 - 34.0 pg   MCHC 32.5 30.0 - 36.0 g/dL   RDW 91.4 (H) 78.2 - 95.6 %   Platelets 203 150 - 400 K/uL   nRBC 0.2 0.0 - 0.2 %  Urinalysis, Routine w reflex microscopic -Urine, Clean Catch  Result Value Ref Range   Color, Urine YELLOW YELLOW   APPearance CLEAR CLEAR   Specific Gravity, Urine 1.015 1.005 - 1.030   pH 5.5 5.0 - 8.0   Glucose, UA 250 (A) NEGATIVE mg/dL   Hgb urine dipstick NEGATIVE NEGATIVE   Bilirubin Urine NEGATIVE NEGATIVE   Ketones, ur NEGATIVE NEGATIVE mg/dL   Protein, ur 213 (A) NEGATIVE mg/dL   Nitrite NEGATIVE NEGATIVE   Leukocytes,Ua SMALL (A) NEGATIVE  Urinalysis, Microscopic (reflex)  Result Value Ref Range   RBC / HPF 0-5 0 - 5 RBC/hpf   WBC, UA 6-10 0 - 5 WBC/hpf   Bacteria, UA FEW (A) NONE SEEN   Squamous Epithelial / HPF 0-5 0 - 5 /HPF  CBG monitoring, ED  Result Value Ref Range   Glucose-Capillary 234 (H) 70 - 99 mg/dL   Comment 1 Notify RN   CBG monitoring, ED  Result Value Ref Range   Glucose-Capillary 181 (H) 70 - 99 mg/dL   No results found.  None  Radiology No results found.  Procedures Procedures    Medications Ordered in ED Medications - No data to display  ED Course/  Medical Decision Making/ A&P                             Medical Decision Making Patient with DM and memory issues presents with sugar 302 and memory issues   Amount and/or Complexity of Data Reviewed Independent Historian:     Details: Daughter see above  External Data Reviewed: notes.    Details: Previous notes reviewed  Labs: ordered.    Details: Urine is without UTI, sodium 133, potassium 5.1, glucose slight elevation 259, creatinine improved from previous 3.13 no anion gap. White count normal 9.8, low hemoglobin 10.5,  Radiology: ordered and independent  interpretation performed.    Details: No acute finding on head ct     Final Clinical Impression(s) / ED Diagnoses Return for intractable cough, coughing up blood, fevers > 100.4 unrelieved by medication, shortness of breath, intractable vomiting, chest pain, shortness of breath, weakness, numbness, changes in speech, facial asymmetry, abdominal pain, passing out, Inability to tolerate liquids or food, cough, altered mental status or any concerns. No signs of systemic illness or infection. The patient is nontoxic-appearing on exam and vital signs are within normal limits.  I have reviewed the triage vital signs and the nursing notes. Pertinent labs & imaging results that were available during my care of the patient were reviewed by me and considered in my medical decision making (see chart for details). After history, exam, and medical workup I feel the patient has been appropriately medically screened and is safe for discharge home. Pertinent diagnoses were discussed with the patient. Patient was given return precautions.   Armarion Greek, MD 10/26/22 2354

## 2023-01-19 ENCOUNTER — Other Ambulatory Visit: Payer: Self-pay | Admitting: Adult Health

## 2023-01-19 DIAGNOSIS — I350 Nonrheumatic aortic (valve) stenosis: Secondary | ICD-10-CM

## 2023-06-17 ENCOUNTER — Other Ambulatory Visit: Payer: Self-pay

## 2023-06-17 ENCOUNTER — Encounter (HOSPITAL_COMMUNITY): Payer: Self-pay

## 2023-06-17 ENCOUNTER — Emergency Department (HOSPITAL_COMMUNITY)
Admission: EM | Admit: 2023-06-17 | Discharge: 2023-06-17 | Disposition: A | Discharge status: Home / Self Care | Payer: Medicare Other | Attending: Emergency Medicine | Admitting: Emergency Medicine

## 2023-06-17 DIAGNOSIS — R5383 Other fatigue: Secondary | ICD-10-CM | POA: Diagnosis not present

## 2023-06-17 DIAGNOSIS — N189 Chronic kidney disease, unspecified: Secondary | ICD-10-CM | POA: Diagnosis not present

## 2023-06-17 DIAGNOSIS — E1122 Type 2 diabetes mellitus with diabetic chronic kidney disease: Secondary | ICD-10-CM | POA: Diagnosis not present

## 2023-06-17 DIAGNOSIS — R300 Dysuria: Secondary | ICD-10-CM | POA: Insufficient documentation

## 2023-06-17 DIAGNOSIS — R531 Weakness: Secondary | ICD-10-CM | POA: Insufficient documentation

## 2023-06-17 DIAGNOSIS — I2510 Atherosclerotic heart disease of native coronary artery without angina pectoris: Secondary | ICD-10-CM | POA: Insufficient documentation

## 2023-06-17 DIAGNOSIS — Z1152 Encounter for screening for COVID-19: Secondary | ICD-10-CM | POA: Insufficient documentation

## 2023-06-17 DIAGNOSIS — I129 Hypertensive chronic kidney disease with stage 1 through stage 4 chronic kidney disease, or unspecified chronic kidney disease: Secondary | ICD-10-CM | POA: Insufficient documentation

## 2023-06-17 DIAGNOSIS — E039 Hypothyroidism, unspecified: Secondary | ICD-10-CM | POA: Insufficient documentation

## 2023-06-17 LAB — COMPREHENSIVE METABOLIC PANEL
ALT: 19 U/L (ref 0–44)
AST: 32 U/L (ref 15–41)
Albumin: 3.1 g/dL — ABNORMAL LOW (ref 3.5–5.0)
Alkaline Phosphatase: 120 U/L (ref 38–126)
Anion gap: 12 (ref 5–15)
BUN: 83 mg/dL — ABNORMAL HIGH (ref 8–23)
CO2: 19 mmol/L — ABNORMAL LOW (ref 22–32)
Calcium: 8.9 mg/dL (ref 8.9–10.3)
Chloride: 107 mmol/L (ref 98–111)
Creatinine, Ser: 4.16 mg/dL — ABNORMAL HIGH (ref 0.44–1.00)
GFR, Estimated: 10 mL/min — ABNORMAL LOW (ref >60)
Glucose, Bld: 212 mg/dL — ABNORMAL HIGH (ref 70–99)
Potassium: 4.9 mmol/L (ref 3.5–5.1)
Sodium: 138 mmol/L (ref 135–145)
Total Bilirubin: 0.6 mg/dL (ref <1.2)
Total Protein: 6.3 g/dL — ABNORMAL LOW (ref 6.5–8.1)

## 2023-06-17 LAB — URINALYSIS, W/ REFLEX TO CULTURE (INFECTION SUSPECTED)
Bacteria, UA: NONE SEEN
Bilirubin Urine: NEGATIVE
Glucose, UA: 50 mg/dL — AB
Hgb urine dipstick: NEGATIVE
Ketones, ur: NEGATIVE mg/dL
Leukocytes,Ua: NEGATIVE
Nitrite: NEGATIVE
Protein, ur: 30 mg/dL — AB
Specific Gravity, Urine: 1.013 (ref 1.005–1.030)
pH: 6 (ref 5.0–8.0)

## 2023-06-17 LAB — CBC WITH DIFFERENTIAL/PLATELET
Abs Immature Granulocytes: 0.03 10*3/uL (ref 0.00–0.07)
Basophils Absolute: 0 10*3/uL (ref 0.0–0.1)
Basophils Relative: 0 %
Eosinophils Absolute: 0.2 10*3/uL (ref 0.0–0.5)
Eosinophils Relative: 4 %
HCT: 30.3 % — ABNORMAL LOW (ref 36.0–46.0)
Hemoglobin: 9.4 g/dL — ABNORMAL LOW (ref 12.0–15.0)
Immature Granulocytes: 0 %
Lymphocytes Relative: 17 %
Lymphs Abs: 1.2 10*3/uL (ref 0.7–4.0)
MCH: 28.4 pg (ref 26.0–34.0)
MCHC: 31 g/dL (ref 30.0–36.0)
MCV: 91.5 fL (ref 80.0–100.0)
Monocytes Absolute: 0.8 10*3/uL (ref 0.1–1.0)
Monocytes Relative: 11 %
Neutro Abs: 4.7 10*3/uL (ref 1.7–7.7)
Neutrophils Relative %: 68 %
Platelets: 146 10*3/uL — ABNORMAL LOW (ref 150–400)
RBC: 3.31 MIL/uL — ABNORMAL LOW (ref 3.87–5.11)
RDW: 15.5 % (ref 11.5–15.5)
WBC: 7 10*3/uL (ref 4.0–10.5)
nRBC: 0 % (ref 0.0–0.2)

## 2023-06-17 LAB — RESP PANEL BY RT-PCR (RSV, FLU A&B, COVID)  RVPGX2
Influenza A by PCR: NEGATIVE
Influenza B by PCR: NEGATIVE
Resp Syncytial Virus by PCR: NEGATIVE
SARS Coronavirus 2 by RT PCR: NEGATIVE

## 2023-06-17 MED ORDER — SODIUM CHLORIDE 0.9 % IV BOLUS
500.0000 mL | Freq: Once | INTRAVENOUS | Status: AC
  Administered 2023-06-17: 500 mL via INTRAVENOUS

## 2023-06-17 NOTE — ED Notes (Signed)
During triage assessment, pt is A&Ox4.

## 2023-06-17 NOTE — ED Triage Notes (Signed)
PER EMS: pt is from [facility] with c/o UTI. She was diagnosed with UTI about a month ago and finished 2 abx (unsure which). She reports urinary urgency, retention, dysuria and urine appears more dark, associated with weakness and fatigue. Pt is A&OX2, ems unsure of baseline mentation.   BP- 168/82, HR-90, 98% RA, RR-24, CBG-275

## 2023-06-17 NOTE — ED Provider Notes (Signed)
Emergency Department Provider Note   I have reviewed the triage vital signs and the nursing notes.   HISTORY  Chief Complaint Dysuria   HPI Molly Cobb is a 89 y.o. female past history of CKD and DM presents to the emergency department from [facility] with continued fatigue, poor appetite, and dysuria. She reports being on 2 different abx over the last month but cannot recall which ones. Reports urine is dark. No abdominal or back pain. No fever. No HA. No CP.  Past Medical History:  Diagnosis Date   CKD (chronic kidney disease)    a. followed by Dr. Janit Pagan at [facility]    Coronary artery disease    02/23/17 PCI/DES to mRCA,  normal EF   Depression    Diabetes mellitus (HCC)    Gout    Hypertension    Hypothyroidism    Presence of permanent cardiac pacemaker    a. followed by [facility]   S/P TAVR (transcatheter aortic valve replacement) 04/12/2017   23 mm Edwards Sapien 3 transcatheter heart valve placed via percutaneous right transfemoral approach    Severe aortic stenosis    a. diagnosed 01/2017 during admission for chest pain.     Review of Systems  Constitutional: No fever/chills. Positive weakness.  Cardiovascular: Denies chest pain. Respiratory: Denies shortness of breath. Gastrointestinal: No abdominal pain.  No nausea, no vomiting.   Genitourinary: Positive for dysuria. Musculoskeletal: Negative for back pain. Skin: Negative for rash. Neurological: Negative for headaches ____________________________________________   PHYSICAL EXAM:  VITAL SIGNS: Vitals:   06/17/23 0254 06/17/23 0453  BP: (!) 118/47 135/64  Pulse: 72 75  Resp: 16 20  Temp: 98.4 F (36.9 C) 98.8 F (37.1 C)  SpO2: 98% 100%    Constitutional: Alert and oriented. Well appearing and in no acute distress. Eyes: Conjunctivae are normal.  Head: Atraumatic. Nose: No congestion/rhinnorhea. Mouth/Throat: Mucous membranes are moist.   Neck: No stridor.   Cardiovascular: Normal  rate, regular rhythm. Good peripheral circulation. Grossly normal heart sounds.   Respiratory: Normal respiratory effort.  No retractions. Lungs CTAB. Gastrointestinal: Soft and nontender. No distention.  Musculoskeletal: No gross deformities of extremities. Neurologic:  Normal speech and language.  Skin:  Skin is warm, dry and intact. No rash noted.   ____________________________________________   LABS (all labs ordered are listed, but only abnormal results are displayed)  Labs Reviewed  URINALYSIS, W/ REFLEX TO CULTURE (INFECTION SUSPECTED) - Abnormal; Notable for the following components:      Result Value   Glucose, UA 50 (*)    Protein, ur 30 (*)    All other components within normal limits  COMPREHENSIVE METABOLIC PANEL - Abnormal; Notable for the following components:   CO2 19 (*)    Glucose, Bld 212 (*)    BUN 83 (*)    Creatinine, Ser 4.16 (*)    Total Protein 6.3 (*)    Albumin 3.1 (*)    GFR, Estimated 10 (*)    All other components within normal limits  CBC WITH DIFFERENTIAL/PLATELET - Abnormal; Notable for the following components:   RBC 3.31 (*)    Hemoglobin 9.4 (*)    HCT 30.3 (*)    Platelets 146 (*)    All other components within normal limits  RESP PANEL BY RT-PCR (RSV, FLU A&B, COVID)  [id number]  URINE CULTURE   ____________________________________________   PROCEDURES  Procedure(s) performed:   Procedures  None  ____________________________________________   INITIAL IMPRESSION / ASSESSMENT AND PLAN /  ED COURSE  Pertinent labs & imaging results that were available during my care of the patient were reviewed by me and considered in my medical decision making (see chart for details).   This patient is Presenting for Evaluation of weakness, which does require a range of treatment options, and is a complaint that involves a high risk of morbidity and mortality.  The Differential Diagnoses include UTI, dehydration, AKI, sepsis, etc.  Critical  Interventions-    Medications  sodium chloride 0.9 % bolus 500 mL (0 mLs Intravenous Stopped 06/17/23 0416)    Reassessment after intervention: symptoms improved.   I decided to review pertinent External Data, and in summary no recent ED visits in our system.   Clinical Laboratory Tests Ordered, included patient with near baseline GFR. No uremia. Normal electrolytes. No UTI.   Social Determinants of Health Risk patient is a non-smoker.   Medical Decision Making: Summary:  Patient presents emergency department with generalized weakness and dysuria.  Does have some UTI symptoms.  No SIRS vitals.  No flank pain or back discomfort to suspect pyelonephritis.  Plan for screening blood work given age and UA.  Plan for IV fluids and reassess.   Reevaluation with update and discussion with patient and now family at bedside. Patient feeling improved after IVF. Stable for discharge. No UTI.   Considered admission but labs are reassuring.   Patient's presentation is most consistent with acute presentation with potential threat to life or bodily function.   Disposition: discharge  ____________________________________________  FINAL CLINICAL IMPRESSION(S) / ED DIAGNOSES  Final diagnoses:  Generalized weakness  Chronic kidney disease, unspecified CKD stage    Note:  This document was prepared using Dragon voice recognition software and may include unintentional dictation errors.  Alona Bene, MD, [facility]    Taiwo Fish, Arlyss Repress, MD 06/17/23 [id number]

## 2023-06-17 NOTE — Discharge Instructions (Signed)
Please follow closely with your PCP in the coming week. I do not see any sign of a urine infection today and wonder if you may have been suffering from some mild dehydration. Return to the ED with any new or worsening symptoms.

## 2023-06-18 LAB — URINE CULTURE: Culture: <10000 — AB

## 2023-07-01 ENCOUNTER — Emergency Department (HOSPITAL_COMMUNITY): Payer: Medicare Other

## 2023-07-01 ENCOUNTER — Inpatient Hospital Stay (HOSPITAL_COMMUNITY)
Admission: EM | Admit: 2023-07-01 | Discharge: 2023-07-13 | DRG: 562 | Disposition: A | Discharge status: Skilled Nursing Facility | Payer: Medicare Other | Source: Skilled Nursing Facility | Attending: Internal Medicine | Admitting: Internal Medicine

## 2023-07-01 DIAGNOSIS — I120 Hypertensive chronic kidney disease with stage 5 chronic kidney disease or end stage renal disease: Secondary | ICD-10-CM | POA: Diagnosis present

## 2023-07-01 DIAGNOSIS — S82001A Unspecified fracture of right patella, initial encounter for closed fracture: Principal | ICD-10-CM | POA: Diagnosis present

## 2023-07-01 DIAGNOSIS — W010XXA Fall on same level from slipping, tripping and stumbling without subsequent striking against object, initial encounter: Secondary | ICD-10-CM | POA: Diagnosis present

## 2023-07-01 DIAGNOSIS — R54 Age-related physical debility: Secondary | ICD-10-CM | POA: Diagnosis present

## 2023-07-01 DIAGNOSIS — E039 Hypothyroidism, unspecified: Secondary | ICD-10-CM | POA: Diagnosis present

## 2023-07-01 DIAGNOSIS — E785 Hyperlipidemia, unspecified: Secondary | ICD-10-CM | POA: Diagnosis present

## 2023-07-01 DIAGNOSIS — D649 Anemia, unspecified: Secondary | ICD-10-CM | POA: Diagnosis present

## 2023-07-01 DIAGNOSIS — I350 Nonrheumatic aortic (valve) stenosis: Secondary | ICD-10-CM | POA: Diagnosis present

## 2023-07-01 DIAGNOSIS — Z882 Allergy status to sulfonamides status: Secondary | ICD-10-CM

## 2023-07-01 DIAGNOSIS — K5900 Constipation, unspecified: Secondary | ICD-10-CM | POA: Diagnosis not present

## 2023-07-01 DIAGNOSIS — R9431 Abnormal electrocardiogram [ECG] [EKG]: Secondary | ICD-10-CM | POA: Diagnosis present

## 2023-07-01 DIAGNOSIS — Z79899 Other long term (current) drug therapy: Secondary | ICD-10-CM

## 2023-07-01 DIAGNOSIS — I2510 Atherosclerotic heart disease of native coronary artery without angina pectoris: Secondary | ICD-10-CM | POA: Diagnosis present

## 2023-07-01 DIAGNOSIS — M79641 Pain in right hand: Secondary | ICD-10-CM | POA: Diagnosis present

## 2023-07-01 DIAGNOSIS — N185 Chronic kidney disease, stage 5: Secondary | ICD-10-CM | POA: Diagnosis present

## 2023-07-01 DIAGNOSIS — N3000 Acute cystitis without hematuria: Secondary | ICD-10-CM

## 2023-07-01 DIAGNOSIS — Z953 Presence of xenogenic heart valve: Secondary | ICD-10-CM

## 2023-07-01 DIAGNOSIS — N390 Urinary tract infection, site not specified: Secondary | ICD-10-CM | POA: Diagnosis present

## 2023-07-01 DIAGNOSIS — D696 Thrombocytopenia, unspecified: Secondary | ICD-10-CM | POA: Diagnosis not present

## 2023-07-01 DIAGNOSIS — Z888 Allergy status to other drugs, medicaments and biological substances status: Secondary | ICD-10-CM

## 2023-07-01 DIAGNOSIS — Z950 Presence of cardiac pacemaker: Secondary | ICD-10-CM

## 2023-07-01 DIAGNOSIS — E871 Hypo-osmolality and hyponatremia: Secondary | ICD-10-CM | POA: Diagnosis not present

## 2023-07-01 DIAGNOSIS — E8809 Other disorders of plasma-protein metabolism, not elsewhere classified: Secondary | ICD-10-CM | POA: Diagnosis present

## 2023-07-01 DIAGNOSIS — W19XXXA Unspecified fall, initial encounter: Principal | ICD-10-CM

## 2023-07-01 DIAGNOSIS — Z91013 Allergy to seafood: Secondary | ICD-10-CM

## 2023-07-01 DIAGNOSIS — E875 Hyperkalemia: Secondary | ICD-10-CM | POA: Diagnosis present

## 2023-07-01 DIAGNOSIS — S82009A Unspecified fracture of unspecified patella, initial encounter for closed fracture: Secondary | ICD-10-CM | POA: Diagnosis not present

## 2023-07-01 DIAGNOSIS — Z7984 Long term (current) use of oral hypoglycemic drugs: Secondary | ICD-10-CM

## 2023-07-01 DIAGNOSIS — N179 Acute kidney failure, unspecified: Secondary | ICD-10-CM

## 2023-07-01 DIAGNOSIS — G9341 Metabolic encephalopathy: Secondary | ICD-10-CM | POA: Diagnosis present

## 2023-07-01 DIAGNOSIS — F32A Depression, unspecified: Secondary | ICD-10-CM | POA: Diagnosis present

## 2023-07-01 DIAGNOSIS — Z6830 Body mass index (BMI) 30.0-30.9, adult: Secondary | ICD-10-CM

## 2023-07-01 DIAGNOSIS — Z8249 Family history of ischemic heart disease and other diseases of the circulatory system: Secondary | ICD-10-CM

## 2023-07-01 DIAGNOSIS — Z751 Person awaiting admission to adequate facility elsewhere: Secondary | ICD-10-CM

## 2023-07-01 DIAGNOSIS — N184 Chronic kidney disease, stage 4 (severe): Secondary | ICD-10-CM | POA: Diagnosis present

## 2023-07-01 DIAGNOSIS — B9620 Unspecified Escherichia coli [E. coli] as the cause of diseases classified elsewhere: Secondary | ICD-10-CM | POA: Diagnosis present

## 2023-07-01 DIAGNOSIS — E66811 Obesity, class 1: Secondary | ICD-10-CM | POA: Diagnosis present

## 2023-07-01 DIAGNOSIS — S00531A Contusion of lip, initial encounter: Secondary | ICD-10-CM | POA: Diagnosis present

## 2023-07-01 DIAGNOSIS — Z79890 Hormone replacement therapy (postmenopausal): Secondary | ICD-10-CM

## 2023-07-01 DIAGNOSIS — E1122 Type 2 diabetes mellitus with diabetic chronic kidney disease: Secondary | ICD-10-CM | POA: Diagnosis present

## 2023-07-01 DIAGNOSIS — Z7982 Long term (current) use of aspirin: Secondary | ICD-10-CM

## 2023-07-01 DIAGNOSIS — E872 Acidosis, unspecified: Secondary | ICD-10-CM | POA: Diagnosis present

## 2023-07-01 DIAGNOSIS — Y9301 Activity, walking, marching and hiking: Secondary | ICD-10-CM | POA: Diagnosis present

## 2023-07-01 DIAGNOSIS — Z8781 Personal history of (healed) traumatic fracture: Secondary | ICD-10-CM

## 2023-07-01 DIAGNOSIS — M109 Gout, unspecified: Secondary | ICD-10-CM | POA: Diagnosis present

## 2023-07-01 DIAGNOSIS — Z833 Family history of diabetes mellitus: Secondary | ICD-10-CM

## 2023-07-01 LAB — CBC WITH DIFFERENTIAL/PLATELET
Abs Immature Granulocytes: 0.04 10*3/uL (ref 0.00–0.07)
Basophils Absolute: 0 10*3/uL (ref 0.0–0.1)
Basophils Relative: 0 %
Eosinophils Absolute: 0.2 10*3/uL (ref 0.0–0.5)
Eosinophils Relative: 2 %
HCT: 33.9 % — ABNORMAL LOW (ref 36.0–46.0)
Hemoglobin: 10.8 g/dL — ABNORMAL LOW (ref 12.0–15.0)
Immature Granulocytes: 0 %
Lymphocytes Relative: 11 %
Lymphs Abs: 1.1 10*3/uL (ref 0.7–4.0)
MCH: 29 pg (ref 26.0–34.0)
MCHC: 31.9 g/dL (ref 30.0–36.0)
MCV: 91.1 fL (ref 80.0–100.0)
Monocytes Absolute: 0.7 10*3/uL (ref 0.1–1.0)
Monocytes Relative: 8 %
Neutro Abs: 7.6 10*3/uL (ref 1.7–7.7)
Neutrophils Relative %: 79 %
Platelets: 153 10*3/uL (ref 150–400)
RBC: 3.72 MIL/uL — ABNORMAL LOW (ref 3.87–5.11)
RDW: 15.3 % (ref 11.5–15.5)
WBC: 9.6 10*3/uL (ref 4.0–10.5)
nRBC: 0 % (ref 0.0–0.2)

## 2023-07-01 LAB — URINALYSIS, ROUTINE W REFLEX MICROSCOPIC
Bilirubin Urine: NEGATIVE
Glucose, UA: 50 mg/dL — AB
Hgb urine dipstick: NEGATIVE
Ketones, ur: NEGATIVE mg/dL
Nitrite: NEGATIVE
Protein, ur: 100 mg/dL — AB
Specific Gravity, Urine: 1.012 (ref 1.005–1.030)
pH: 6 (ref 5.0–8.0)

## 2023-07-01 LAB — BASIC METABOLIC PANEL
Anion gap: 11 (ref 5–15)
BUN: 71 mg/dL — ABNORMAL HIGH (ref 8–23)
CO2: 20 mmol/L — ABNORMAL LOW (ref 22–32)
Calcium: 9 mg/dL (ref 8.9–10.3)
Chloride: 104 mmol/L (ref 98–111)
Creatinine, Ser: 4.28 mg/dL — ABNORMAL HIGH (ref 0.44–1.00)
GFR, Estimated: 9 mL/min — ABNORMAL LOW (ref >60)
Glucose, Bld: 184 mg/dL — ABNORMAL HIGH (ref 70–99)
Potassium: 5.2 mmol/L — ABNORMAL HIGH (ref 3.5–5.1)
Sodium: 135 mmol/L (ref 135–145)

## 2023-07-01 MED ORDER — ACETAMINOPHEN 325 MG PO TABS
650.0000 mg | ORAL_TABLET | Freq: Once | ORAL | Status: AC
  Administered 2023-07-01: 650 mg via ORAL
  Filled 2023-07-01: qty 2

## 2023-07-01 MED ORDER — SODIUM CHLORIDE 0.9 % IV SOLN
1.0000 g | Freq: Once | INTRAVENOUS | Status: AC
  Administered 2023-07-01: 1 g via INTRAVENOUS
  Filled 2023-07-01: qty 10

## 2023-07-01 NOTE — ED Provider Notes (Signed)
 Patient seen by me along with physician assistant.  I provided a substantive portion of the care of this patient.  I personally made/approved the management plan for this patient and take responsibility for the patient management.      Patient sent in from [facility] following a fall.  Appears the patient fell and hit her face.  She does have a laceration to the inner lip area.  Nothing external.  Bleeding controlled.  Also some swelling to the lip.  Has lower dentures so no evidence of any injury to the dentures.  Blood pressure is 134/62.  Patient states she was walking with her walker and then went down.  Past medical history significant for hypertension diabetes severe aortic stenosis chronic kidney disease hypothyroidism.  Appears the patient is not on any blood thinners.  On exam patient complaining of right knee pain.  Also when raise left leg patient seems to complain of pain in the hip area.  Based on that would recommend CT head CT maxillofacial CT cervical spine portable chest x-ray x-ray of the right knee and x-ray of the pelvis.  In addition they will get CBC basic metabolic panel and urinalysis.  EKG as well.     Molly Keilman, MD 07/01/23 2007

## 2023-07-01 NOTE — ED Triage Notes (Signed)
 Pt arrived via [facility] from [facility] green for fall, R knee pain. Aaox4 per EMS.  Vs 134/62 bp 93 hr 100 ra 220 cbg 96.101F temp

## 2023-07-01 NOTE — ED Provider Notes (Signed)
 [facility] Provider Note   CSN: [id number] Arrival date & time: 07/01/23  1919     History  Chief Complaint  Patient presents with   Fall   Knee Pain    Molly Cobb is a 89 y.o. female with a past medical history significant for diabetes, CKD, hypertension, depression, and CAD who presents to the ED after a fall.  Patient unsure what caused her to fall.  Notes she was walking with her walker and fell forward and hit her face.  No LOC.  On ASA 81 mg however, no other blood thinners.  Patient also admits to right knee pain.  Denies hip pain.  No chest pain or shortness of breath.  Denies abdominal pain.  Denies back pain.  No other injuries.  History obtained from patient and past medical records. No interpreter used during encounter.       Home Medications Prior to Admission medications   Medication Sig Start Date End Date Taking? Authorizing Provider  aspirin  81 MG chewable tablet Chew 1 tablet (81 mg total) by mouth daily. 02/25/17  Yes Henry Manuelita NOVAK, NP  atorvastatin  (LIPITOR) 40 MG tablet Take 1 tablet (40 mg total) by mouth every morning. 10/01/21  Yes Medina-Vargas, Monina C, NP  bisacodyl  (DULCOLAX) 10 MG suppository If not relieved by MOM, give 10 mg Bisacodyl  suppositiory rectally X 1 dose in 24 hours as needed   Yes [provider]  carvedilol  (COREG ) 12.5 MG tablet Take 12.5 mg by mouth 2 (two) times daily with a meal. 03/30/23 03/29/24 Yes [provider]  Coenzyme Q10 (COQ-10 PO) Take 1 capsule by mouth every morning.   Yes [provider]  conjugated estrogens  (PREMARIN ) vaginal cream Place 1 Applicatorful vaginally every Monday, Wednesday, and Friday. 10/02/21  Yes Medina-Vargas, Monina C, NP  escitalopram  (LEXAPRO ) 10 MG tablet Take 1 tablet (10 mg total) by mouth every morning. 10/01/21  Yes Medina-Vargas, Monina C, NP  ezetimibe  (ZETIA ) 10 MG tablet Take 1 tablet (10 mg total) by mouth every  morning. 10/01/21  Yes Medina-Vargas, Monina C, NP  glipiZIDE  (GLUCOTROL  XL) 5 MG 24 hr tablet Take 5 mg by mouth daily with breakfast.   Yes [provider]  isosorbide  mononitrate (IMDUR ) 30 MG 24 hr tablet Take 1 tablet (30 mg total) by mouth every morning. 10/01/21  Yes Medina-Vargas, Monina C, NP  levothyroxine  (SYNTHROID ) 88 MCG tablet Take 1 tablet (88 mcg total) by mouth daily before breakfast. 10/01/21  Yes Medina-Vargas, Monina C, NP  lidocaine  (LIDODERM ) 5 % Place 1 patch onto the skin daily. Remove & Discard patch within 12 hours or as directed by MD Patient taking differently: Place 1 patch onto the skin daily as needed. Remove & Discard patch within 12 hours or as directed by MD 05/31/22  Yes Desiderio Chew, PA-C  Magnesium  Hydroxide (MILK OF MAGNESIA PO) Take by mouth. If no BM in 3 days, give 30 cc Milk of Magnesium  p.o. x 1 dose in 24 hours as needed   Yes [provider]  Melatonin 5 MG CHEW Chew 1 tablet by mouth at bedtime.   Yes [provider]  Multiple Vitamin (MULTIVITAMIN WITH MINERALS) TABS tablet Take 1 tablet by mouth daily.   Yes [provider]  nitroGLYCERIN  (NITROSTAT ) 0.4 MG SL tablet Place 1 tablet (0.4 mg total) under the tongue every 5 (five) minutes as needed for chest pain. 10/01/21  Yes Medina-Vargas, Monina C, NP  sodium bicarbonate   650 MG tablet Take 1 tablet (650 mg total) by mouth every morning. Patient taking differently: Take 650-1,300 mg by mouth 2 (two) times daily. Take 1 tablet (650 mg) in the morning and Take 2 tablets (1300 mg) at bedtime 10/01/21  Yes Medina-Vargas, Monina C, NP  Sodium Phosphates (RA SALINE ENEMA RE) If not relieved by Biscodyl suppository, give disposable Saline Enema rectally X 1 dose/24 hrs as needed   Yes [provider]  TRADJENTA  5 MG TABS tablet Take 1 tablet (5 mg total) by mouth every morning. 10/01/21  Yes Medina-Vargas, Monina C, NP  amLODipine  (NORVASC ) 10 MG tablet Take 1 tablet (10 mg  total) by mouth every morning. Patient not taking: Reported on 07/01/2023 10/02/21   Medina-Vargas, Monina C, NP  hydrOXYzine  (ATARAX ) 10 MG tablet Take 1 tablet (10 mg total) by mouth 2 (two) times daily as needed for anxiety. Patient not taking: Reported on 07/01/2023 10/01/21   Medina-Vargas, Monina C, NP  metoprolol  tartrate (LOPRESSOR ) 25 MG tablet TAKE 1/2 (ONE-HALF) TABLET BY MOUTH TWICE DAILY Patient not taking: Reported on 07/01/2023 10/01/21   Medina-Vargas, Monina C, NP  NON FORMULARY Diet:Heart Healthy/CCD/1200 fluid restriction    [provider]  pantoprazole  (PROTONIX ) 40 MG tablet Take 1 tablet (40 mg total) by mouth daily. Patient not taking: Reported on 07/01/2023 10/01/21   Medina-Vargas, Monina C, NP      Allergies    Oxybutynin, Oysters [shellfish allergy], and Sulfa antibiotics    Review of Systems   Review of Systems  Constitutional:  Negative for chills and fever.  Respiratory:  Negative for shortness of breath.   Cardiovascular:  Negative for chest pain.  Gastrointestinal:  Negative for abdominal pain.  Genitourinary:  Negative for dysuria.  Musculoskeletal:  Positive for arthralgias.  Skin:  Positive for wound.    Physical Exam Updated Vital Signs BP (!) 157/72   Pulse 85   Temp 97.9 F (36.6 C) (Oral)   Resp (!) 23   SpO2 98%  Physical Exam Vitals and nursing note reviewed.  Constitutional:      General: She is not in acute distress.    Appearance: She is not ill-appearing.  HENT:     Head: Normocephalic.     Mouth/Throat:     Comments: Hematoma to right side of lower lip. Eyes:     Pupils: Pupils are equal, round, and reactive to light.  Cardiovascular:     Rate and Rhythm: Normal rate and regular rhythm.     Pulses: Normal pulses.     Heart sounds: Normal heart sounds. No murmur heard.    No friction rub. No gallop.  Pulmonary:     Effort: Pulmonary effort is normal.     Breath sounds: Normal breath sounds.  Abdominal:     General: Abdomen is  flat. There is no distension.     Palpations: Abdomen is soft.     Tenderness: There is no abdominal tenderness. There is no guarding or rebound.  Musculoskeletal:        General: Normal range of motion.     Cervical back: Neck supple.     Comments: Tenderness to palpation throughout anterior aspect of right knee with decreased range of motion.  No right bony hip pain.  Right lower extremity neurovascularly intact with soft compartments.  Skin:    General: Skin is warm and dry.  Neurological:     General: No focal deficit present.     Mental Status: She is alert.  Psychiatric:        Mood and Affect: Mood normal.        Behavior: Behavior normal.     ED Results / Procedures / Treatments   Labs (all labs ordered are listed, but only abnormal results are displayed) Labs Reviewed  CBC WITH DIFFERENTIAL/PLATELET - Abnormal; Notable for the following components:      Result Value   RBC 3.72 (*)    Hemoglobin 10.8 (*)    HCT 33.9 (*)    All other components within normal limits  BASIC METABOLIC PANEL - Abnormal; Notable for the following components:   Potassium 5.2 (*)    CO2 20 (*)    Glucose, Bld 184 (*)    BUN 71 (*)    Creatinine, Ser 4.28 (*)    GFR, Estimated 9 (*)    All other components within normal limits  URINALYSIS, ROUTINE W REFLEX MICROSCOPIC - Abnormal; Notable for the following components:   Color, Urine AMBER (*)    APPearance CLOUDY (*)    Glucose, UA 50 (*)    Protein, ur 100 (*)    Leukocytes,Ua MODERATE (*)    Bacteria, UA RARE (*)    All other components within normal limits  URINE CULTURE  TROPONIN I (HIGH SENSITIVITY)    EKG None  Radiology CT Head Wo Contrast Result Date: 07/01/2023 CLINICAL DATA:  Fall EXAM: CT HEAD WITHOUT CONTRAST CT MAXILLOFACIAL WITHOUT CONTRAST CT CERVICAL SPINE WITHOUT CONTRAST TECHNIQUE: Multidetector CT imaging of the head, cervical spine, and maxillofacial structures were performed using the standard protocol without  intravenous contrast. Multiplanar CT image reconstructions of the cervical spine and maxillofacial structures were also generated. RADIATION DOSE REDUCTION: This exam was performed according to the departmental dose-optimization program which includes automated exposure control, adjustment of the mA and/or kV according to patient size and/or use of iterative reconstruction technique. COMPARISON:  CT brain cervical and facial 05/13/2022 FINDINGS: CT HEAD FINDINGS Brain: No acute territorial infarction, hemorrhage or intracranial mass. Atrophy and chronic small vessel ischemic changes of the white matter. Small chronic right thalamic infarcts. Stable ventricle size. Vascular: No hyperdense vessels.  Carotid vascular calcification Skull: Normal. Negative for fracture or focal lesion. Other: None CT MAXILLOFACIAL FINDINGS Osseous: Mastoid air cells are clear. No mandibular fracture. Pterygoid plates and zygomatic arches are intact. No acute nasal bone fracture Orbits: Negative. No traumatic or inflammatory finding. Sinuses: Clear. Soft tissues: Right chin soft tissue laceration or skin wound. Stable 11 mm left parotid node or nodule. CT CERVICAL SPINE FINDINGS Alignment: Straightening of the cervical spine. No subluxation. Facet alignment is within normal limits Skull base and vertebrae: No acute fracture. No primary bone lesion or focal pathologic process. Soft tissues and spinal canal: No prevertebral fluid or swelling. No visible canal hematoma. Disc levels: Multilevel degenerative change. Mild disc space narrowing C5-C6 and C6-C7. Mild multilevel facet degenerative changes. Upper chest: Negative. Other: None IMPRESSION: 1. No CT evidence for acute intracranial abnormality. Atrophy and chronic small vessel ischemic changes of the white matter. 2. Straightening of the cervical spine with degenerative changes. No acute osseous abnormality. 3. Right chin soft tissue laceration or skin wound. No acute facial bone  fracture Electronically Signed   By: Luke Bun M.D.   On: 07/01/2023 22:11   CT Maxillofacial Wo Contrast Result Date: 07/01/2023 CLINICAL DATA:  Fall EXAM: CT HEAD WITHOUT CONTRAST CT MAXILLOFACIAL WITHOUT CONTRAST CT CERVICAL SPINE WITHOUT CONTRAST TECHNIQUE: Multidetector CT imaging of the head, cervical spine, and maxillofacial  structures were performed using the standard protocol without intravenous contrast. Multiplanar CT image reconstructions of the cervical spine and maxillofacial structures were also generated. RADIATION DOSE REDUCTION: This exam was performed according to the departmental dose-optimization program which includes automated exposure control, adjustment of the mA and/or kV according to patient size and/or use of iterative reconstruction technique. COMPARISON:  CT brain cervical and facial 05/13/2022 FINDINGS: CT HEAD FINDINGS Brain: No acute territorial infarction, hemorrhage or intracranial mass. Atrophy and chronic small vessel ischemic changes of the white matter. Small chronic right thalamic infarcts. Stable ventricle size. Vascular: No hyperdense vessels.  Carotid vascular calcification Skull: Normal. Negative for fracture or focal lesion. Other: None CT MAXILLOFACIAL FINDINGS Osseous: Mastoid air cells are clear. No mandibular fracture. Pterygoid plates and zygomatic arches are intact. No acute nasal bone fracture Orbits: Negative. No traumatic or inflammatory finding. Sinuses: Clear. Soft tissues: Right chin soft tissue laceration or skin wound. Stable 11 mm left parotid node or nodule. CT CERVICAL SPINE FINDINGS Alignment: Straightening of the cervical spine. No subluxation. Facet alignment is within normal limits Skull base and vertebrae: No acute fracture. No primary bone lesion or focal pathologic process. Soft tissues and spinal canal: No prevertebral fluid or swelling. No visible canal hematoma. Disc levels: Multilevel degenerative change. Mild disc space narrowing C5-C6  and C6-C7. Mild multilevel facet degenerative changes. Upper chest: Negative. Other: None IMPRESSION: 1. No CT evidence for acute intracranial abnormality. Atrophy and chronic small vessel ischemic changes of the white matter. 2. Straightening of the cervical spine with degenerative changes. No acute osseous abnormality. 3. Right chin soft tissue laceration or skin wound. No acute facial bone fracture Electronically Signed   By: Luke Bun M.D.   On: 07/01/2023 22:11   CT Cervical Spine Wo Contrast Result Date: 07/01/2023 CLINICAL DATA:  Fall EXAM: CT HEAD WITHOUT CONTRAST CT MAXILLOFACIAL WITHOUT CONTRAST CT CERVICAL SPINE WITHOUT CONTRAST TECHNIQUE: Multidetector CT imaging of the head, cervical spine, and maxillofacial structures were performed using the standard protocol without intravenous contrast. Multiplanar CT image reconstructions of the cervical spine and maxillofacial structures were also generated. RADIATION DOSE REDUCTION: This exam was performed according to the departmental dose-optimization program which includes automated exposure control, adjustment of the mA and/or kV according to patient size and/or use of iterative reconstruction technique. COMPARISON:  CT brain cervical and facial 05/13/2022 FINDINGS: CT HEAD FINDINGS Brain: No acute territorial infarction, hemorrhage or intracranial mass. Atrophy and chronic small vessel ischemic changes of the white matter. Small chronic right thalamic infarcts. Stable ventricle size. Vascular: No hyperdense vessels.  Carotid vascular calcification Skull: Normal. Negative for fracture or focal lesion. Other: None CT MAXILLOFACIAL FINDINGS Osseous: Mastoid air cells are clear. No mandibular fracture. Pterygoid plates and zygomatic arches are intact. No acute nasal bone fracture Orbits: Negative. No traumatic or inflammatory finding. Sinuses: Clear. Soft tissues: Right chin soft tissue laceration or skin wound. Stable 11 mm left parotid node or nodule. CT  CERVICAL SPINE FINDINGS Alignment: Straightening of the cervical spine. No subluxation. Facet alignment is within normal limits Skull base and vertebrae: No acute fracture. No primary bone lesion or focal pathologic process. Soft tissues and spinal canal: No prevertebral fluid or swelling. No visible canal hematoma. Disc levels: Multilevel degenerative change. Mild disc space narrowing C5-C6 and C6-C7. Mild multilevel facet degenerative changes. Upper chest: Negative. Other: None IMPRESSION: 1. No CT evidence for acute intracranial abnormality. Atrophy and chronic small vessel ischemic changes of the white matter. 2. Straightening of the cervical spine  with degenerative changes. No acute osseous abnormality. 3. Right chin soft tissue laceration or skin wound. No acute facial bone fracture Electronically Signed   By: Luke Bun M.D.   On: 07/01/2023 22:11   DG Pelvis 1-2 Views Result Date: 07/01/2023 CLINICAL DATA:  Recent fall with pelvic pain, initial encounter EXAM: PELVIS - 1 VIEW COMPARISON:  None Available. FINDINGS: Pelvic ring is intact. Postsurgical changes are noted in the proximal right femur. Acute fracture or dislocation is seen. No soft tissue abnormality is noted. IMPRESSION: No acute abnormality noted. Electronically Signed   By: Oneil Devonshire M.D.   On: 07/01/2023 21:41   DG Knee Complete 4 Views Right Result Date: 07/01/2023 CLINICAL DATA:  Recent fall with right knee pain, initial encounter EXAM: RIGHT KNEE - COMPLETE 4+ VIEW COMPARISON:  None FINDINGS: Medullary rod is noted in the distal right femur. Transverse fracture through the midportion of the patella is noted with approximately 8 mm displacement of the fracture fragments. No other fracture or dislocation is seen. No soft tissue changes are noted. IMPRESSION: Mid patellar fracture. Electronically Signed   By: Oneil Devonshire M.D.   On: 07/01/2023 21:40   DG Chest Portable 1 View Result Date: 07/01/2023 CLINICAL DATA:  Recent fall with  chest pain, initial encounter EXAM: PORTABLE CHEST 1 VIEW COMPARISON:  05/13/2023 FINDINGS: Cardiac shadow is enlarged. Pacing device is again seen. Findings of prior TAVR are noted. Aortic calcifications are seen. The lungs are clear bilaterally. No bony abnormality is noted. IMPRESSION: No acute abnormality noted. Electronically Signed   By: Oneil Devonshire M.D.   On: 07/01/2023 21:39    Procedures Procedures    Medications Ordered in ED Medications  cefTRIAXone  (ROCEPHIN ) 1 g in sodium chloride  0.9 % 100 mL IVPB (1 g Intravenous New Bag/Given 07/01/23 2159)  acetaminophen  (TYLENOL ) tablet 650 mg (650 mg Oral Given 07/01/23 2307)    ED Course/ Medical Decision Making/ A&P Clinical Course as of 07/01/23 2352  Fri Jul 01, 2023  [address]): MODERATE [CA]  [address] WBC, UA: 11-20 [CA]  2141 Bacteria, UA(!): RARE [CA]    Clinical Course User Index [CA] Lorelle Aleck BROCKS, PA-C                                 Medical Decision Making Amount and/or Complexity of Data Reviewed Labs: ordered. Decision-making details documented in ED Course. Radiology: ordered and independent interpretation performed. Decision-making details documented in ED Course. ECG/medicine tests: ordered and independent interpretation performed. Decision-making details documented in ED Course.  Risk OTC drugs. Decision regarding hospitalization.   This patient presents to the ED for concern of fall, this involves an extensive number of treatment options, and is a complaint that carries with it a high risk of complications and morbidity.  The differential diagnosis includes intracranial bleed, bony fracture, infection, metabolic derangement, etc  89 year old female presents to the ED from [facility] after a fall.  Unsure what caused her to fall.  On ASA 81 mg however, no other blood thinners.  No LOC.  Patient admits to facial pain and right knee pain.  Upon arrival patient afebrile, not tachycardic or hypoxic.   Patient in no acute distress.  Superficial laceration to inner mucosa of lower lip.  Laceration does not go through and through.  Tenderness about anterior aspect of right knee with decreased range of motion.  Routine labs ordered.  UA to rule out UTI.  CT scans and x-rays ordered to rule out bony fractures and intracranial abnormalities.  CT images personally reviewed and interpreted which are negative for any acute abnormalities.  Knee x-ray demonstrates a mid patellar fracture.  Patient placed in a knee immobilizer.  Right lower extremity neurovascularly intact with soft compartments.  Low suspicion for compartment syndrome.  UA concerning for UTI.  IV Rocephin  given.  Upon reassessment, daughter and son-in-law at bedside and daughter notes patient has been slightly more confused over the past few days than baseline, likely from UTI.  Currently resides in independent living.  BMP with elevated creatinine at 4.28 and BUN at 71 which is slightly more elevated than baseline.  Patient does have CKD.  Given UTI and confusion will consult hospitalist for admission for IV antibiotics.  Since secure chat to Dr. Vernetta with [facility] to see patient in the morning.  11:49 PM Discussed with Dr. Alfornia with [facility] who agrees to admit patient. Troponin added per request  Co morbidities that complicate the patient evaluation  DM, CKD Cardiac Monitoring: / EKG:  The patient was maintained on a cardiac monitor.  I personally viewed and interpreted the cardiac monitored which showed an underlying rhythm of: junctional rhythm   Social Determinants of Health:  Lives in living facility, elderly >65  Test / Admission - Considered:  Patient will require admission for acute cystitis.   Discussed with Dr. Zackowski who evaluated patient and agrees with assessment and plan.         Final Clinical Impression(s) / ED Diagnoses Final diagnoses:  Fall, initial encounter  Acute cystitis without hematuria   Closed nondisplaced fracture of right patella, unspecified fracture morphology, initial encounter    Rx / DC Orders ED Discharge Orders     None         Lorelle Aleck JAYSON DEVONNA 07/01/23 2352    Geraldene Hamilton, MD 07/03/23 1843

## 2023-07-02 ENCOUNTER — Encounter (HOSPITAL_COMMUNITY): Payer: Self-pay | Admitting: Internal Medicine

## 2023-07-02 ENCOUNTER — Other Ambulatory Visit: Payer: Self-pay

## 2023-07-02 ENCOUNTER — Observation Stay (HOSPITAL_COMMUNITY): Payer: Medicare Other

## 2023-07-02 DIAGNOSIS — S82001A Unspecified fracture of right patella, initial encounter for closed fracture: Secondary | ICD-10-CM | POA: Diagnosis present

## 2023-07-02 DIAGNOSIS — S00531A Contusion of lip, initial encounter: Secondary | ICD-10-CM | POA: Diagnosis present

## 2023-07-02 DIAGNOSIS — G9341 Metabolic encephalopathy: Secondary | ICD-10-CM | POA: Diagnosis present

## 2023-07-02 DIAGNOSIS — Y9301 Activity, walking, marching and hiking: Secondary | ICD-10-CM | POA: Diagnosis present

## 2023-07-02 DIAGNOSIS — S82009A Unspecified fracture of unspecified patella, initial encounter for closed fracture: Secondary | ICD-10-CM | POA: Diagnosis present

## 2023-07-02 DIAGNOSIS — R54 Age-related physical debility: Secondary | ICD-10-CM | POA: Diagnosis present

## 2023-07-02 DIAGNOSIS — M109 Gout, unspecified: Secondary | ICD-10-CM | POA: Diagnosis present

## 2023-07-02 DIAGNOSIS — N179 Acute kidney failure, unspecified: Secondary | ICD-10-CM | POA: Diagnosis not present

## 2023-07-02 DIAGNOSIS — I2510 Atherosclerotic heart disease of native coronary artery without angina pectoris: Secondary | ICD-10-CM | POA: Diagnosis present

## 2023-07-02 DIAGNOSIS — I120 Hypertensive chronic kidney disease with stage 5 chronic kidney disease or end stage renal disease: Secondary | ICD-10-CM | POA: Diagnosis present

## 2023-07-02 DIAGNOSIS — R4182 Altered mental status, unspecified: Secondary | ICD-10-CM | POA: Diagnosis not present

## 2023-07-02 DIAGNOSIS — N185 Chronic kidney disease, stage 5: Secondary | ICD-10-CM | POA: Diagnosis present

## 2023-07-02 DIAGNOSIS — N184 Chronic kidney disease, stage 4 (severe): Secondary | ICD-10-CM | POA: Diagnosis not present

## 2023-07-02 DIAGNOSIS — I350 Nonrheumatic aortic (valve) stenosis: Secondary | ICD-10-CM | POA: Diagnosis present

## 2023-07-02 DIAGNOSIS — E8809 Other disorders of plasma-protein metabolism, not elsewhere classified: Secondary | ICD-10-CM | POA: Diagnosis present

## 2023-07-02 DIAGNOSIS — F32A Depression, unspecified: Secondary | ICD-10-CM | POA: Diagnosis present

## 2023-07-02 DIAGNOSIS — D696 Thrombocytopenia, unspecified: Secondary | ICD-10-CM | POA: Diagnosis not present

## 2023-07-02 DIAGNOSIS — E872 Acidosis, unspecified: Secondary | ICD-10-CM | POA: Diagnosis present

## 2023-07-02 DIAGNOSIS — E66811 Obesity, class 1: Secondary | ICD-10-CM | POA: Diagnosis present

## 2023-07-02 DIAGNOSIS — R9431 Abnormal electrocardiogram [ECG] [EKG]: Secondary | ICD-10-CM | POA: Diagnosis present

## 2023-07-02 DIAGNOSIS — M79641 Pain in right hand: Secondary | ICD-10-CM | POA: Diagnosis present

## 2023-07-02 DIAGNOSIS — E785 Hyperlipidemia, unspecified: Secondary | ICD-10-CM | POA: Diagnosis present

## 2023-07-02 DIAGNOSIS — E1122 Type 2 diabetes mellitus with diabetic chronic kidney disease: Secondary | ICD-10-CM | POA: Diagnosis present

## 2023-07-02 DIAGNOSIS — W010XXA Fall on same level from slipping, tripping and stumbling without subsequent striking against object, initial encounter: Secondary | ICD-10-CM | POA: Diagnosis present

## 2023-07-02 DIAGNOSIS — S82034A Nondisplaced transverse fracture of right patella, initial encounter for closed fracture: Secondary | ICD-10-CM | POA: Diagnosis not present

## 2023-07-02 DIAGNOSIS — N390 Urinary tract infection, site not specified: Secondary | ICD-10-CM | POA: Diagnosis present

## 2023-07-02 DIAGNOSIS — E875 Hyperkalemia: Secondary | ICD-10-CM | POA: Diagnosis present

## 2023-07-02 DIAGNOSIS — B9620 Unspecified Escherichia coli [E. coli] as the cause of diseases classified elsewhere: Secondary | ICD-10-CM | POA: Diagnosis present

## 2023-07-02 DIAGNOSIS — E039 Hypothyroidism, unspecified: Secondary | ICD-10-CM | POA: Diagnosis present

## 2023-07-02 DIAGNOSIS — E871 Hypo-osmolality and hyponatremia: Secondary | ICD-10-CM | POA: Diagnosis not present

## 2023-07-02 DIAGNOSIS — D649 Anemia, unspecified: Secondary | ICD-10-CM | POA: Diagnosis present

## 2023-07-02 LAB — BASIC METABOLIC PANEL
Anion gap: 9 (ref 5–15)
BUN: 70 mg/dL — ABNORMAL HIGH (ref 8–23)
CO2: 20 mmol/L — ABNORMAL LOW (ref 22–32)
Calcium: 8.8 mg/dL — ABNORMAL LOW (ref 8.9–10.3)
Chloride: 107 mmol/L (ref 98–111)
Creatinine, Ser: 4.07 mg/dL — ABNORMAL HIGH (ref 0.44–1.00)
GFR, Estimated: 10 mL/min — ABNORMAL LOW (ref >60)
Glucose, Bld: 144 mg/dL — ABNORMAL HIGH (ref 70–99)
Potassium: 4.9 mmol/L (ref 3.5–5.1)
Sodium: 136 mmol/L (ref 135–145)

## 2023-07-02 LAB — GLUCOSE, CAPILLARY
Glucose-Capillary: 129 mg/dL — ABNORMAL HIGH (ref 70–99)
Glucose-Capillary: 147 mg/dL — ABNORMAL HIGH (ref 70–99)
Glucose-Capillary: 177 mg/dL — ABNORMAL HIGH (ref 70–99)

## 2023-07-02 LAB — T4, FREE: Free T4: 0.89 ng/dL (ref 0.61–1.12)

## 2023-07-02 LAB — TROPONIN I (HIGH SENSITIVITY)
Troponin I (High Sensitivity): 29 ng/L — ABNORMAL HIGH (ref <18)
Troponin I (High Sensitivity): 33 ng/L — ABNORMAL HIGH (ref <18)
Troponin I (High Sensitivity): 44 ng/L — ABNORMAL HIGH (ref <18)

## 2023-07-02 LAB — CBC
HCT: 31.8 % — ABNORMAL LOW (ref 36.0–46.0)
Hemoglobin: 10.2 g/dL — ABNORMAL LOW (ref 12.0–15.0)
MCH: 29 pg (ref 26.0–34.0)
MCHC: 32.1 g/dL (ref 30.0–36.0)
MCV: 90.3 fL (ref 80.0–100.0)
Platelets: 166 10*3/uL (ref 150–400)
RBC: 3.52 MIL/uL — ABNORMAL LOW (ref 3.87–5.11)
RDW: 15.2 % (ref 11.5–15.5)
WBC: 13.3 10*3/uL — ABNORMAL HIGH (ref 4.0–10.5)
nRBC: 0 % (ref 0.0–0.2)

## 2023-07-02 LAB — CBG MONITORING, ED
Glucose-Capillary: 136 mg/dL — ABNORMAL HIGH (ref 70–99)
Glucose-Capillary: 162 mg/dL — ABNORMAL HIGH (ref 70–99)
Glucose-Capillary: 124 mg/dL — ABNORMAL HIGH (ref 70–99)

## 2023-07-02 LAB — VITAMIN B12: Vitamin B-12: 1063 pg/mL — ABNORMAL HIGH (ref 180–914)

## 2023-07-02 LAB — AMMONIA: Ammonia: 17 umol/L (ref 9–35)

## 2023-07-02 LAB — VITAMIN D 25 HYDROXY (VIT D DEFICIENCY, FRACTURES): Vit D, 25-Hydroxy: 44.99 ng/mL (ref 30–100)

## 2023-07-02 LAB — TSH: TSH: 38.309 u[IU]/mL — ABNORMAL HIGH (ref 0.350–4.500)

## 2023-07-02 MED ORDER — DOCUSATE SODIUM 100 MG PO CAPS
100.0000 mg | ORAL_CAPSULE | Freq: Two times a day (BID) | ORAL | Status: DC
Start: 2023-07-02 — End: 2023-07-13
  Administered 2023-07-02 – 2023-07-13 (×20): 100 mg via ORAL
  Filled 2023-07-02 (×20): qty 1

## 2023-07-02 MED ORDER — SENNOSIDES-DOCUSATE SODIUM 8.6-50 MG PO TABS
1.0000 | ORAL_TABLET | Freq: Every evening | ORAL | Status: DC | PRN
  Administered 2023-07-11 – 2023-07-12 (×2): 1 via ORAL

## 2023-07-02 MED ORDER — SODIUM BICARBONATE 650 MG PO TABS
1300.0000 mg | ORAL_TABLET | Freq: Every day | ORAL | Status: DC
  Administered 2023-07-02 – 2023-07-09 (×7): 1300 mg via ORAL
  Filled 2023-07-02 (×7): qty 2

## 2023-07-02 MED ORDER — HYDROCODONE-ACETAMINOPHEN 5-325 MG PO TABS
1.0000 | ORAL_TABLET | Freq: Once | ORAL | Status: AC
  Administered 2023-07-02: 1 via ORAL
  Filled 2023-07-02: qty 1

## 2023-07-02 MED ORDER — EZETIMIBE 10 MG PO TABS
10.0000 mg | ORAL_TABLET | Freq: Every evening | ORAL | Status: DC
  Administered 2023-07-02 – 2023-07-12 (×10): 10 mg via ORAL
  Filled 2023-07-02 (×12): qty 1

## 2023-07-02 MED ORDER — ACETAMINOPHEN 325 MG PO TABS
650.0000 mg | ORAL_TABLET | Freq: Four times a day (QID) | ORAL | Status: DC | PRN
  Administered 2023-07-02 – 2023-07-13 (×18): 650 mg via ORAL
  Filled 2023-07-02 (×18): qty 2

## 2023-07-02 MED ORDER — GUAIFENESIN 100 MG/5ML PO LIQD: 5.0000 mL | ORAL | Status: DC | PRN

## 2023-07-02 MED ORDER — SODIUM CHLORIDE 0.9 % IV SOLN: INTRAVENOUS | Status: AC

## 2023-07-02 MED ORDER — ATORVASTATIN CALCIUM 40 MG PO TABS
40.0000 mg | ORAL_TABLET | Freq: Every evening | ORAL | Status: DC
  Administered 2023-07-02 – 2023-07-12 (×10): 40 mg via ORAL
  Filled 2023-07-02 (×3): qty 1
  Filled 2023-07-02: qty 2
  Filled 2023-07-02 (×8): qty 1

## 2023-07-02 MED ORDER — INSULIN ASPART 100 UNIT/ML IJ SOLN: 0.0000 [IU] | Freq: Every day | INTRAMUSCULAR | Status: DC

## 2023-07-02 MED ORDER — SODIUM BICARBONATE 650 MG PO TABS
650.0000 mg | ORAL_TABLET | Freq: Every day | ORAL | Status: DC
  Administered 2023-07-03 – 2023-07-09 (×6): 650 mg via ORAL
  Filled 2023-07-02 (×8): qty 1

## 2023-07-02 MED ORDER — HYDRALAZINE HCL 20 MG/ML IJ SOLN: 10.0000 mg | INTRAMUSCULAR | Status: DC | PRN

## 2023-07-02 MED ORDER — SODIUM CHLORIDE 0.9 % IV SOLN
INTRAVENOUS | Status: DC
Start: 2023-07-02 — End: 2023-07-02

## 2023-07-02 MED ORDER — TRAZODONE HCL 50 MG PO TABS
50.0000 mg | ORAL_TABLET | Freq: Every evening | ORAL | Status: DC | PRN
  Administered 2023-07-09: 50 mg via ORAL
  Filled 2023-07-02 (×2): qty 1

## 2023-07-02 MED ORDER — OXYCODONE HCL 5 MG PO TABS
5.0000 mg | ORAL_TABLET | Freq: Four times a day (QID) | ORAL | Status: DC | PRN
  Administered 2023-07-03 – 2023-07-07 (×9): 5 mg via ORAL
  Filled 2023-07-02 (×12): qty 1

## 2023-07-02 MED ORDER — LEVOTHYROXINE SODIUM 88 MCG PO TABS
88.0000 ug | ORAL_TABLET | Freq: Every day | ORAL | Status: DC
  Administered 2023-07-02 – 2023-07-03 (×2): 88 ug via ORAL
  Filled 2023-07-02 (×2): qty 1

## 2023-07-02 MED ORDER — ISOSORBIDE MONONITRATE ER 30 MG PO TB24
30.0000 mg | ORAL_TABLET | Freq: Every morning | ORAL | Status: DC
  Administered 2023-07-03 – 2023-07-13 (×10): 30 mg via ORAL
  Filled 2023-07-02 (×11): qty 1

## 2023-07-02 MED ORDER — SODIUM CHLORIDE 0.9 % IV SOLN
1.0000 g | INTRAVENOUS | Status: DC
  Administered 2023-07-02 – 2023-07-03 (×2): 1 g via INTRAVENOUS
  Filled 2023-07-02 (×2): qty 10

## 2023-07-02 MED ORDER — CARVEDILOL 12.5 MG PO TABS
12.5000 mg | ORAL_TABLET | Freq: Two times a day (BID) | ORAL | Status: DC
  Administered 2023-07-02 – 2023-07-13 (×21): 12.5 mg via ORAL
  Filled 2023-07-02 (×23): qty 1

## 2023-07-02 MED ORDER — INSULIN ASPART 100 UNIT/ML IJ SOLN: 0.0000 [IU] | Freq: Three times a day (TID) | INTRAMUSCULAR | Status: DC

## 2023-07-02 MED ORDER — ACETAMINOPHEN 650 MG RE SUPP
650.0000 mg | Freq: Four times a day (QID) | RECTAL | Status: DC | PRN
Start: 2023-07-02 — End: 2023-07-13

## 2023-07-02 MED ORDER — ESCITALOPRAM OXALATE 10 MG PO TABS
10.0000 mg | ORAL_TABLET | Freq: Every morning | ORAL | Status: DC
  Administered 2023-07-03 – 2023-07-13 (×10): 10 mg via ORAL
  Filled 2023-07-02 (×10): qty 1

## 2023-07-02 MED ORDER — SODIUM ZIRCONIUM CYCLOSILICATE 10 G PO PACK: 10.0000 g | PACK | Freq: Once | ORAL | Status: DC

## 2023-07-02 MED ORDER — ONDANSETRON HCL 4 MG/2ML IJ SOLN: 4.0000 mg | Freq: Four times a day (QID) | INTRAMUSCULAR | Status: DC | PRN

## 2023-07-02 MED ORDER — METOPROLOL TARTRATE 5 MG/5ML IV SOLN: 5.0000 mg | INTRAVENOUS | Status: DC | PRN

## 2023-07-02 MED ORDER — SODIUM BICARBONATE 650 MG PO TABS
650.0000 mg | ORAL_TABLET | Freq: Two times a day (BID) | ORAL | Status: DC
Start: 2023-07-02 — End: 2023-07-02

## 2023-07-02 MED ORDER — HYDROMORPHONE HCL 1 MG/ML IJ SOLN
0.5000 mg | INTRAMUSCULAR | Status: DC | PRN
  Administered 2023-07-02 – 2023-07-05 (×2): 0.5 mg via INTRAVENOUS
  Filled 2023-07-02 (×2): qty 0.5

## 2023-07-02 MED ORDER — IPRATROPIUM-ALBUTEROL 0.5-2.5 (3) MG/3ML IN SOLN
3.0000 mL | RESPIRATORY_TRACT | Status: DC | PRN
  Administered 2023-07-06: 3 mL via RESPIRATORY_TRACT
  Filled 2023-07-02: qty 3

## 2023-07-02 MED ORDER — INSULIN ASPART 100 UNIT/ML IJ SOLN
0.0000 [IU] | INTRAMUSCULAR | Status: DC
  Administered 2023-07-02 (×2): 2 [IU] via SUBCUTANEOUS
  Administered 2023-07-02: 1 [IU] via SUBCUTANEOUS
  Administered 2023-07-03: 2 [IU] via SUBCUTANEOUS
  Administered 2023-07-03 (×3): 1 [IU] via SUBCUTANEOUS
  Administered 2023-07-03 – 2023-07-04 (×2): 3 [IU] via SUBCUTANEOUS
  Administered 2023-07-04: 1 [IU] via SUBCUTANEOUS
  Administered 2023-07-04: 3 [IU] via SUBCUTANEOUS
  Administered 2023-07-04: 1 [IU] via SUBCUTANEOUS
  Administered 2023-07-05: 3 [IU] via SUBCUTANEOUS
  Administered 2023-07-05: 2 [IU] via SUBCUTANEOUS
  Administered 2023-07-05: 3 [IU] via SUBCUTANEOUS
  Administered 2023-07-05: 2 [IU] via SUBCUTANEOUS
  Administered 2023-07-06 (×3): 3 [IU] via SUBCUTANEOUS
  Administered 2023-07-06: 1 [IU] via SUBCUTANEOUS
  Administered 2023-07-07: 2 [IU] via SUBCUTANEOUS
  Administered 2023-07-07 (×4): 3 [IU] via SUBCUTANEOUS
  Administered 2023-07-08: 2 [IU] via SUBCUTANEOUS
  Administered 2023-07-08: 3 [IU] via SUBCUTANEOUS
  Administered 2023-07-08: 2 [IU] via SUBCUTANEOUS
  Administered 2023-07-08: 3 [IU] via SUBCUTANEOUS
  Administered 2023-07-08: 5 [IU] via SUBCUTANEOUS
  Administered 2023-07-09: 1 [IU] via SUBCUTANEOUS
  Administered 2023-07-09: 3 [IU] via SUBCUTANEOUS
  Administered 2023-07-09: 1 [IU] via SUBCUTANEOUS
  Administered 2023-07-09: 3 [IU] via SUBCUTANEOUS
  Administered 2023-07-10 (×3): 1 [IU] via SUBCUTANEOUS
  Administered 2023-07-10: 3 [IU] via SUBCUTANEOUS
  Administered 2023-07-11: 1 [IU] via SUBCUTANEOUS
  Administered 2023-07-11: 5 [IU] via SUBCUTANEOUS
  Administered 2023-07-11: 3 [IU] via SUBCUTANEOUS
  Administered 2023-07-11 (×2): 1 [IU] via SUBCUTANEOUS
  Administered 2023-07-12: 3 [IU] via SUBCUTANEOUS
  Administered 2023-07-12 (×2): 1 [IU] via SUBCUTANEOUS
  Administered 2023-07-12: 3 [IU] via SUBCUTANEOUS
  Administered 2023-07-13 (×2): 1 [IU] via SUBCUTANEOUS
  Administered 2023-07-13: 3 [IU] via SUBCUTANEOUS
  Administered 2023-07-13: 1 [IU] via SUBCUTANEOUS
  Filled 2023-07-02: qty 0.09

## 2023-07-02 NOTE — Plan of Care (Signed)
  Problem: Education: Goal: Knowledge of General Education information will improve Description: Including pain rating scale, medication(s)/side effects and non-pharmacologic comfort measures Outcome: Progressing   Problem: Health Behavior/Discharge Planning: Goal: Ability to manage health-related needs will improve Outcome: Progressing   Problem: Clinical Measurements: Goal: Ability to maintain clinical measurements within normal limits will improve Outcome: Progressing   Problem: Activity: Goal: Risk for activity intolerance will decrease Outcome: Progressing   Problem: Nutrition: Goal: Adequate nutrition will be maintained Outcome: Progressing   Problem: Coping: Goal: Level of anxiety will decrease Outcome: Progressing   Problem: Elimination: Goal: Will not experience complications related to bowel motility Outcome: Progressing   Problem: Pain Management: Goal: General experience of comfort will improve Outcome: Progressing   Problem: Safety: Goal: Ability to remain free from injury will improve Outcome: Progressing   Problem: Skin Integrity: Goal: Risk for impaired skin integrity will decrease Outcome: Progressing

## 2023-07-02 NOTE — Progress Notes (Signed)
 PROGRESS NOTE    Molly Cobb  FMW:[id number] DOB: 11-Dec-1931 DOA: 07/01/2023 PCP: Fredricka Richerd Ee, MD    Brief Narrative:  89 year old with history of CKD stage IV, CAD, depression, DM2, gout, HTN, hypothyroidism, severe AAS status post TAVR, history of pacemaker, chronic anemia presented to the ED from [place] [place] for evaluation of right knee and facial pain after a fall.  Per family patient has been more confused over the past few days.  Chest x-ray, x-ray pelvis, CT head, cervical spine, CT maxillofacial did not show any fracture or acute trauma.  There was small laceration of the skin noted over the right chin.  X-ray of the right knee showed mild patellar fracture.  Dr. Vernetta from orthopedic was consulted and patient was placed on knee immobilizer.   Assessment & Plan:  Principal Problem:   Patella fracture Active Problems:   Hypothyroidism (acquired)   Type 2 diabetes mellitus with stage 4 chronic kidney disease, without long-term current use of insulin  (HCC)   CKD (chronic kidney disease) stage 4, GFR 15-29 ml/min (HCC)   UTI (urinary tract infection)   Acute metabolic encephalopathy   AKI (acute kidney injury) (HCC)   Metabolic acidosis   Hyperkalemia    Mild right patellar fracture Mechanical fall Secondary to a mechanical fall.  Orthopedics, Dr. Vernetta, consulted. Recommend Knee immobilizer    Possible UTI with mild dysuria Follow culture data.  Empiric IV Rocephin .   Acute metabolic encephalopathy Possibly related to UTI.  Per ED report, family was concerned that patient has been more confused over the past few days.  At present, patient is AAOx4 and answering questions appropriately.  CT head showing no acute intracranial abnormality and no obvious focal neurodeficit on exam.   AKI on CKD stage IV Mild metabolic acidosis, mild hyperkalemia Uremia BUN 71, creatinine 4.28 (baseline 3.1-3.6), continue gentle hydration, monitor urine output and renal  function.   Right hand pain Supportive care, x-rays negative   Abnormal EKG History of CAD EKG showing new T wave inversions inferolaterally.  Troponins overall remains flat   Depression Continue Lexapro .   Type 2 diabetes On sliding scale and Accu-Cheks   Hypertension Continue Coreg  and Imdur .  IV as needed   Hyperlipidemia Continue Lipitor and Zetia .   Hypothyroidism Continue Synthroid , elevated TSH.  Check free T4   Chronic anemia Hemoglobin stable, continue to monitor labs.  PT/OT   DVT prophylaxis: SCDs Start: 07/02/23 0430    Code Status: Full Code Family Communication:  Daughter updated.  Pain control and SNF    Subjective:  Reporting of knee pain. No other complaints.   Examination:  General exam: Appears calm and comfortable ; elderly frail  Respiratory system: Clear to auscultation. Respiratory effort normal. Cardiovascular system: S1 & S2 heard, RRR. No JVD, murmurs, rubs, gallops or clicks. No pedal edema. Gastrointestinal system: Abdomen is nondistended, soft and nontender. No organomegaly or masses felt. Normal bowel sounds heard. Central nervous system: Alert and oriented. No focal neurological deficits. Extremities: RLE knee immobilizer in place.  Skin: No rashes, lesions or ulcers Psychiatry: Judgement and insight appear normal. Mood & affect appropriate.                Diet Orders (From admission, onward)     Start     Ordered   07/02/23 0431  Diet NPO time specified Except for: Sips with Meds  Diet effective now       Question:  Except for  Answer:  Sips with Meds  07/02/23 0433            Objective: Vitals:   07/02/23 0740 07/02/23 0817 07/02/23 0830 07/02/23 1000  BP: (!) 145/59 (!) 157/62 (!) 164/63 (!) 115/57  Pulse: 73 69 69 83  Resp: 16  16 16   Temp: 97.8 F (36.6 C)     TempSrc: Oral     SpO2: 100%  99% 98%   No intake or output data in the 24 hours ending 07/02/23 1102 There were no vitals filed for  this visit.  Scheduled Meds:  atorvastatin   40 mg Oral QPM   carvedilol   12.5 mg Oral BID WC   docusate sodium   100 mg Oral BID   escitalopram   10 mg Oral q morning   ezetimibe   10 mg Oral QPM   insulin  aspart  0-9 Units Subcutaneous Q4H   isosorbide  mononitrate  30 mg Oral q morning   levothyroxine   88 mcg Oral Q0600   sodium bicarbonate   650 mg Oral Daily   And   sodium bicarbonate   1,300 mg Oral QHS   Continuous Infusions:  sodium chloride  50 mL/hr at 07/02/23 0854   cefTRIAXone  (ROCEPHIN )  IV      Nutritional status     There is no height or weight on file to calculate BMI.  Data Reviewed:   CBC: Recent Labs  Lab 07/01/23 2125 07/02/23 0442  WBC 9.6 13.3*  NEUTROABS 7.6  --   HGB 10.8* 10.2*  HCT 33.9* 31.8*  MCV 91.1 90.3  PLT 153 166   Basic Metabolic Panel: Recent Labs  Lab 07/01/23 2125 07/02/23 0432  NA 135 136  K 5.2* 4.9  CL 104 107  CO2 20* 20*  GLUCOSE 184* 144*  BUN 71* 70*  CREATININE 4.28* 4.07*  CALCIUM  9.0 8.8*   GFR: CrCl cannot be calculated (Unknown ideal weight.). Liver Function Tests: No results for input(s): AST, ALT, ALKPHOS, BILITOT, PROT, ALBUMIN  in the last 168 hours. No results for input(s): LIPASE, AMYLASE in the last 168 hours. No results for input(s): AMMONIA in the last 168 hours. Coagulation Profile: No results for input(s): INR, PROTIME in the last 168 hours. Cardiac Enzymes: No results for input(s): CKTOTAL, CKMB, CKMBINDEX, TROPONINI in the last 168 hours. BNP (last 3 results) No results for input(s): PROBNP in the last 8760 hours. HbA1C: No results for input(s): HGBA1C in the last 72 hours. CBG: Recent Labs  Lab 07/02/23 0456 07/02/23 0759  GLUCAP 136* 162*   Lipid Profile: No results for input(s): CHOL, HDL, LDLCALC, TRIG, CHOLHDL, LDLDIRECT in the last 72 hours. Thyroid  Function Tests: Recent Labs    07/02/23 0507  TSH 38.309*   Anemia Panel: Recent  Labs    07/02/23 0916  VITAMINB12 1,063*   Sepsis Labs: No results for input(s): PROCALCITON, LATICACIDVEN in the last 168 hours.  No results found for this or any previous visit (from the past 240 hours).       Radiology Studies: DG Hand Complete Right Result Date: 07/02/2023 CLINICAL DATA:  Swelling with bone deformity EXAM: RIGHT HAND - COMPLETE 3 VIEW COMPARISON:  None Available. FINDINGS: There is no evidence of fracture or dislocation. Generalized osteopenia and degenerative interphalangeal joint narrowing, greatest at the fifth DIP joint. IMPRESSION: No acute or focal finding. Electronically Signed   By: Dorn Roulette M.D.   On: 07/02/2023 05:51   CT Head Wo Contrast Result Date: 07/01/2023 CLINICAL DATA:  Fall EXAM: CT HEAD WITHOUT CONTRAST CT MAXILLOFACIAL WITHOUT CONTRAST CT CERVICAL  SPINE WITHOUT CONTRAST TECHNIQUE: Multidetector CT imaging of the head, cervical spine, and maxillofacial structures were performed using the standard protocol without intravenous contrast. Multiplanar CT image reconstructions of the cervical spine and maxillofacial structures were also generated. RADIATION DOSE REDUCTION: This exam was performed according to the departmental dose-optimization program which includes automated exposure control, adjustment of the mA and/or kV according to patient size and/or use of iterative reconstruction technique. COMPARISON:  CT brain cervical and facial 05/13/2022 FINDINGS: CT HEAD FINDINGS Brain: No acute territorial infarction, hemorrhage or intracranial mass. Atrophy and chronic small vessel ischemic changes of the white matter. Small chronic right thalamic infarcts. Stable ventricle size. Vascular: No hyperdense vessels.  Carotid vascular calcification Skull: Normal. Negative for fracture or focal lesion. Other: None CT MAXILLOFACIAL FINDINGS Osseous: Mastoid air cells are clear. No mandibular fracture. Pterygoid plates and zygomatic arches are intact. No acute  nasal bone fracture Orbits: Negative. No traumatic or inflammatory finding. Sinuses: Clear. Soft tissues: Right chin soft tissue laceration or skin wound. Stable 11 mm left parotid node or nodule. CT CERVICAL SPINE FINDINGS Alignment: Straightening of the cervical spine. No subluxation. Facet alignment is within normal limits Skull base and vertebrae: No acute fracture. No primary bone lesion or focal pathologic process. Soft tissues and spinal canal: No prevertebral fluid or swelling. No visible canal hematoma. Disc levels: Multilevel degenerative change. Mild disc space narrowing C5-C6 and C6-C7. Mild multilevel facet degenerative changes. Upper chest: Negative. Other: None IMPRESSION: 1. No CT evidence for acute intracranial abnormality. Atrophy and chronic small vessel ischemic changes of the white matter. 2. Straightening of the cervical spine with degenerative changes. No acute osseous abnormality. 3. Right chin soft tissue laceration or skin wound. No acute facial bone fracture Electronically Signed   By: Luke Bun M.D.   On: 07/01/2023 22:11   CT Maxillofacial Wo Contrast Result Date: 07/01/2023 CLINICAL DATA:  Fall EXAM: CT HEAD WITHOUT CONTRAST CT MAXILLOFACIAL WITHOUT CONTRAST CT CERVICAL SPINE WITHOUT CONTRAST TECHNIQUE: Multidetector CT imaging of the head, cervical spine, and maxillofacial structures were performed using the standard protocol without intravenous contrast. Multiplanar CT image reconstructions of the cervical spine and maxillofacial structures were also generated. RADIATION DOSE REDUCTION: This exam was performed according to the departmental dose-optimization program which includes automated exposure control, adjustment of the mA and/or kV according to patient size and/or use of iterative reconstruction technique. COMPARISON:  CT brain cervical and facial 05/13/2022 FINDINGS: CT HEAD FINDINGS Brain: No acute territorial infarction, hemorrhage or intracranial mass. Atrophy and  chronic small vessel ischemic changes of the white matter. Small chronic right thalamic infarcts. Stable ventricle size. Vascular: No hyperdense vessels.  Carotid vascular calcification Skull: Normal. Negative for fracture or focal lesion. Other: None CT MAXILLOFACIAL FINDINGS Osseous: Mastoid air cells are clear. No mandibular fracture. Pterygoid plates and zygomatic arches are intact. No acute nasal bone fracture Orbits: Negative. No traumatic or inflammatory finding. Sinuses: Clear. Soft tissues: Right chin soft tissue laceration or skin wound. Stable 11 mm left parotid node or nodule. CT CERVICAL SPINE FINDINGS Alignment: Straightening of the cervical spine. No subluxation. Facet alignment is within normal limits Skull base and vertebrae: No acute fracture. No primary bone lesion or focal pathologic process. Soft tissues and spinal canal: No prevertebral fluid or swelling. No visible canal hematoma. Disc levels: Multilevel degenerative change. Mild disc space narrowing C5-C6 and C6-C7. Mild multilevel facet degenerative changes. Upper chest: Negative. Other: None IMPRESSION: 1. No CT evidence for acute intracranial abnormality. Atrophy and chronic small  vessel ischemic changes of the white matter. 2. Straightening of the cervical spine with degenerative changes. No acute osseous abnormality. 3. Right chin soft tissue laceration or skin wound. No acute facial bone fracture Electronically Signed   By: Luke Bun M.D.   On: 07/01/2023 22:11   CT Cervical Spine Wo Contrast Result Date: 07/01/2023 CLINICAL DATA:  Fall EXAM: CT HEAD WITHOUT CONTRAST CT MAXILLOFACIAL WITHOUT CONTRAST CT CERVICAL SPINE WITHOUT CONTRAST TECHNIQUE: Multidetector CT imaging of the head, cervical spine, and maxillofacial structures were performed using the standard protocol without intravenous contrast. Multiplanar CT image reconstructions of the cervical spine and maxillofacial structures were also generated. RADIATION DOSE REDUCTION:  This exam was performed according to the departmental dose-optimization program which includes automated exposure control, adjustment of the mA and/or kV according to patient size and/or use of iterative reconstruction technique. COMPARISON:  CT brain cervical and facial 05/13/2022 FINDINGS: CT HEAD FINDINGS Brain: No acute territorial infarction, hemorrhage or intracranial mass. Atrophy and chronic small vessel ischemic changes of the white matter. Small chronic right thalamic infarcts. Stable ventricle size. Vascular: No hyperdense vessels.  Carotid vascular calcification Skull: Normal. Negative for fracture or focal lesion. Other: None CT MAXILLOFACIAL FINDINGS Osseous: Mastoid air cells are clear. No mandibular fracture. Pterygoid plates and zygomatic arches are intact. No acute nasal bone fracture Orbits: Negative. No traumatic or inflammatory finding. Sinuses: Clear. Soft tissues: Right chin soft tissue laceration or skin wound. Stable 11 mm left parotid node or nodule. CT CERVICAL SPINE FINDINGS Alignment: Straightening of the cervical spine. No subluxation. Facet alignment is within normal limits Skull base and vertebrae: No acute fracture. No primary bone lesion or focal pathologic process. Soft tissues and spinal canal: No prevertebral fluid or swelling. No visible canal hematoma. Disc levels: Multilevel degenerative change. Mild disc space narrowing C5-C6 and C6-C7. Mild multilevel facet degenerative changes. Upper chest: Negative. Other: None IMPRESSION: 1. No CT evidence for acute intracranial abnormality. Atrophy and chronic small vessel ischemic changes of the white matter. 2. Straightening of the cervical spine with degenerative changes. No acute osseous abnormality. 3. Right chin soft tissue laceration or skin wound. No acute facial bone fracture Electronically Signed   By: Luke Bun M.D.   On: 07/01/2023 22:11   DG Pelvis 1-2 Views Result Date: 07/01/2023 CLINICAL DATA:  Recent fall with  pelvic pain, initial encounter EXAM: PELVIS - 1 VIEW COMPARISON:  None Available. FINDINGS: Pelvic ring is intact. Postsurgical changes are noted in the proximal right femur. Acute fracture or dislocation is seen. No soft tissue abnormality is noted. IMPRESSION: No acute abnormality noted. Electronically Signed   By: Oneil Devonshire M.D.   On: 07/01/2023 21:41   DG Knee Complete 4 Views Right Result Date: 07/01/2023 CLINICAL DATA:  Recent fall with right knee pain, initial encounter EXAM: RIGHT KNEE - COMPLETE 4+ VIEW COMPARISON:  None FINDINGS: Medullary rod is noted in the distal right femur. Transverse fracture through the midportion of the patella is noted with approximately 8 mm displacement of the fracture fragments. No other fracture or dislocation is seen. No soft tissue changes are noted. IMPRESSION: Mid patellar fracture. Electronically Signed   By: Oneil Devonshire M.D.   On: 07/01/2023 21:40   DG Chest Portable 1 View Result Date: 07/01/2023 CLINICAL DATA:  Recent fall with chest pain, initial encounter EXAM: PORTABLE CHEST 1 VIEW COMPARISON:  05/13/2023 FINDINGS: Cardiac shadow is enlarged. Pacing device is again seen. Findings of prior TAVR are noted. Aortic calcifications are seen. The  lungs are clear bilaterally. No bony abnormality is noted. IMPRESSION: No acute abnormality noted. Electronically Signed   By: Oneil Devonshire M.D.   On: 07/01/2023 21:39           LOS: 0 days   Time spent= 35 mins    Burgess JAYSON Dare, MD [facility]  If 7PM-7AM, please contact night-coverage  07/02/2023, 11:02 AM

## 2023-07-02 NOTE — H&P (Signed)
 History and Physical    Molly Cobb FMW:[id number] DOB: 14-Jul-1931 DOA: 07/01/2023  PCP: Fredricka Richerd Ee, MD  Chief Complaint: Right knee pain  HPI: Molly Cobb is a 89 y.o. female with medical history significant of CKD stage IV, CAD, depression, type 2 diabetes, gout, hypertension, hypothyroidism, severe aortic stenosis status post TAVR, history of pacemaker placement, chronic anemia presented to the ED from [facility] for evaluation of right knee pain and facial pain after a fall. Family was concerned that patient has been more confused over the past few days. She had a superficial laceration to inner mucosa of lower lip. Also tenderness and decreased range of motion of her right knee. Vital signs on arrival: Temperature 97.7 F, pulse 73, respiratory rate 19, blood pressure 154/64, and SpO2 100% on room air. Labs showing no leukocytosis, hemoglobin 10.8 (stable), potassium 5.2, bicarb 20, anion gap 11, glucose 184, BUN 71, creatinine 4.28 (baseline 3.1-3.6). UA with negative nitrite, moderate leukocytes, and microscopy showing 11-20 WBCs and rare bacteria. Urine culture pending. Chest x-ray showing no acute abnormality. X-ray of pelvis showing no acute abnormality. CT head showing no acute intracranial abnormality. CT C-spine negative for acute traumatic injury. CT maxillofacial showing right chin soft tissue laceration or skin wound but no acute facial bone fracture. X-ray of right knee showing mild patellar fracture. Orthopedics (Dr. Vernetta) consulted and patient placed in knee immobilizer. Patient was given Tylenol , Norco, and ceftriaxone . [facility] called to admit.  Patient states she stays at an independent living facility.  She was using her walker to ambulate when she lost her balance and fell on her right knee and since then having pain in this knee.  She also reports hitting her lower lip on the right side against her walker.  In addition, endorsing pain in her right hand in the fifth  metacarpal bone region.  Denies loss of consciousness.  She is reporting mild dysuria and urinary frequency/urgency.  Reporting cough for the past week.  Denies fevers, chest pain, nausea, vomiting, or abdominal pain.  Review of Systems:  Review of Systems  All other systems reviewed and are negative.   Past Medical History:  Diagnosis Date   CKD (chronic kidney disease)    a. followed by Dr. Carlette at [facility]    Coronary artery disease    02/23/17 PCI/DES to mRCA,  normal EF   Depression    Diabetes mellitus (HCC)    Gout    Hypertension    Hypothyroidism    Presence of permanent cardiac pacemaker    a. followed by [facility]   S/P TAVR (transcatheter aortic valve replacement) 04/12/2017   23 mm Edwards Sapien 3 transcatheter heart valve placed via percutaneous right transfemoral approach    Severe aortic stenosis    a. diagnosed 01/2017 during admission for chest pain.     Past Surgical History:  Procedure Laterality Date   APPENDECTOMY     CESAREAN SECTION     CORONARY ATHERECTOMY N/A 02/23/2017   Procedure: CORONARY ATHERECTOMY;  Surgeon: Wonda Sharper, MD;  Location: [facility] INVASIVE CV LAB;  Service: Cardiovascular;  Laterality: N/A;   ESOPHAGOGASTRODUODENOSCOPY N/A 02/18/2017   Procedure: ESOPHAGOGASTRODUODENOSCOPY (EGD);  Surgeon: Teressa Toribio SQUIBB, MD;  Location: [facility] ENDOSCOPY;  Service: Endoscopy;  Laterality: N/A;   EYE SURGERY     FEMUR FRACTURE SURGERY     HOT HEMOSTASIS N/A 02/18/2017   Procedure: HOT HEMOSTASIS (ARGON PLASMA COAGULATION/BICAP);  Surgeon: Teressa Toribio SQUIBB, MD;  Location: [facility] ENDOSCOPY;  Service: Endoscopy;  Laterality: N/A;   IR RADIOLOGY PERIPHERAL GUIDED IV START  03/07/2017   IR US  GUIDE VASC ACCESS RIGHT  03/07/2017   RIGHT/LEFT HEART CATH AND CORONARY ANGIOGRAPHY N/A 02/16/2017   Procedure: RIGHT/LEFT HEART CATH AND CORONARY ANGIOGRAPHY;  Surgeon: Darron Deatrice LABOR, MD;  Location: [facility] INVASIVE CV LAB;  Service: Cardiovascular;  Laterality: N/A;    ROTATOR CUFF REPAIR     TEE WITHOUT CARDIOVERSION N/A 04/12/2017   Procedure: TRANSESOPHAGEAL ECHOCARDIOGRAM (TEE);  Surgeon: Wonda Sharper, MD;  Location: [facility] OR;  Service: Open Heart Surgery;  Laterality: N/A;   THYROID  SURGERY     TRANSCATHETER AORTIC VALVE REPLACEMENT, TRANSFEMORAL N/A 04/12/2017   Procedure: TRANSCATHETER AORTIC VALVE REPLACEMENT, TRANSFEMORAL;  Surgeon: Wonda Sharper, MD;  Location: [facility] OR;  Service: Open Heart Surgery;  Laterality: N/A;     reports that she has never smoked. She has never used smokeless tobacco. She reports that she does not drink alcohol and does not use drugs.  Allergies  Allergen Reactions   Oxybutynin Other (See Comments)    Caused hallucinations   Oysters [Shellfish Allergy] Nausea And Vomiting   Sulfa Antibiotics Nausea And Vomiting    Family History  Problem Relation Age of Onset   CAD Mother        MI in her mid 55's   Stroke Mother        Light stroke   Diabetes Father    Hypertension Sister    Healthy Sister    Healthy Sister    Lung cancer Sister    Heart disease Neg Hx     Prior to Admission medications   Medication Sig Start Date End Date Taking? Authorizing Provider  aspirin  81 MG chewable tablet Chew 1 tablet (81 mg total) by mouth daily. 02/25/17  Yes Henry Manuelita NOVAK, NP  atorvastatin  (LIPITOR) 40 MG tablet Take 1 tablet (40 mg total) by mouth every morning. 10/01/21  Yes Medina-Vargas, Monina C, NP  bisacodyl  (DULCOLAX) 10 MG suppository If not relieved by MOM, give 10 mg Bisacodyl  suppositiory rectally X 1 dose in 24 hours as needed   Yes [provider]  carvedilol  (COREG ) 12.5 MG tablet Take 12.5 mg by mouth 2 (two) times daily with a meal. 03/30/23 03/29/24 Yes [provider]  Coenzyme Q10 (COQ-10 PO) Take 1 capsule by mouth every morning.   Yes [provider]  conjugated estrogens  (PREMARIN ) vaginal cream Place 1 Applicatorful vaginally every Monday, Wednesday, and Friday. 10/02/21  Yes  Medina-Vargas, Monina C, NP  escitalopram  (LEXAPRO ) 10 MG tablet Take 1 tablet (10 mg total) by mouth every morning. 10/01/21  Yes Medina-Vargas, Monina C, NP  ezetimibe  (ZETIA ) 10 MG tablet Take 1 tablet (10 mg total) by mouth every morning. 10/01/21  Yes Medina-Vargas, Monina C, NP  glipiZIDE  (GLUCOTROL  XL) 5 MG 24 hr tablet Take 5 mg by mouth daily with breakfast.   Yes [provider]  isosorbide  mononitrate (IMDUR ) 30 MG 24 hr tablet Take 1 tablet (30 mg total) by mouth every morning. 10/01/21  Yes Medina-Vargas, Monina C, NP  levothyroxine  (SYNTHROID ) 88 MCG tablet Take 1 tablet (88 mcg total) by mouth daily before breakfast. 10/01/21  Yes Medina-Vargas, Monina C, NP  lidocaine  (LIDODERM ) 5 % Place 1 patch onto the skin daily. Remove & Discard patch within 12 hours or as directed by MD Patient taking differently: Place 1 patch onto the skin daily as needed. Remove & Discard patch within 12 hours or as directed by MD 05/31/22  Yes Desiderio,  Fonda, PA-C  Magnesium  Hydroxide (MILK OF MAGNESIA PO) Take by mouth. If no BM in 3 days, give 30 cc Milk of Magnesium  p.o. x 1 dose in 24 hours as needed   Yes [provider]  Melatonin 5 MG CHEW Chew 1 tablet by mouth at bedtime.   Yes [provider]  Multiple Vitamin (MULTIVITAMIN WITH MINERALS) TABS tablet Take 1 tablet by mouth daily.   Yes [provider]  nitroGLYCERIN  (NITROSTAT ) 0.4 MG SL tablet Place 1 tablet (0.4 mg total) under the tongue every 5 (five) minutes as needed for chest pain. 10/01/21  Yes Medina-Vargas, Monina C, NP  sodium bicarbonate  650 MG tablet Take 1 tablet (650 mg total) by mouth every morning. Patient taking differently: Take 650-1,300 mg by mouth 2 (two) times daily. Take 1 tablet (650 mg) in the morning and Take 2 tablets (1300 mg) at bedtime 10/01/21  Yes Medina-Vargas, Monina C, NP  Sodium Phosphates (RA SALINE ENEMA RE) If not relieved by Biscodyl suppository, give disposable Saline Enema rectally X 1  dose/24 hrs as needed   Yes [provider]  TRADJENTA  5 MG TABS tablet Take 1 tablet (5 mg total) by mouth every morning. 10/01/21  Yes Medina-Vargas, Monina C, NP  amLODipine  (NORVASC ) 10 MG tablet Take 1 tablet (10 mg total) by mouth every morning. Patient not taking: Reported on 07/01/2023 10/02/21   Medina-Vargas, Monina C, NP  hydrOXYzine  (ATARAX ) 10 MG tablet Take 1 tablet (10 mg total) by mouth 2 (two) times daily as needed for anxiety. Patient not taking: Reported on 07/01/2023 10/01/21   Medina-Vargas, Monina C, NP  metoprolol  tartrate (LOPRESSOR ) 25 MG tablet TAKE 1/2 (ONE-HALF) TABLET BY MOUTH TWICE DAILY Patient not taking: Reported on 07/01/2023 10/01/21   Medina-Vargas, Monina C, NP  NON FORMULARY Diet:Heart Healthy/CCD/1200 fluid restriction    [provider]  pantoprazole  (PROTONIX ) 40 MG tablet Take 1 tablet (40 mg total) by mouth daily. Patient not taking: Reported on 07/01/2023 10/01/21   Phyllis Jereld BROCKS, NP    Physical Exam: Vitals:   07/01/23 2300 07/01/23 2341 07/02/23 0230 07/02/23 0337  BP: (!) 157/72  (!) 160/83   Pulse: 85  71   Resp: (!) 23  18   Temp:  97.9 F (36.6 C)  97.6 F (36.4 C)  TempSrc:  Oral  Oral  SpO2: 98%  99%     Physical Exam Vitals reviewed.  Constitutional:      General: She is not in acute distress. HENT:     Head: Normocephalic and atraumatic.  Eyes:     Extraocular Movements: Extraocular movements intact.  Cardiovascular:     Rate and Rhythm: Normal rate and regular rhythm.     Pulses: Normal pulses.  Pulmonary:     Effort: Pulmonary effort is normal. No respiratory distress.     Breath sounds: Normal breath sounds. No wheezing or rales.  Abdominal:     General: Bowel sounds are normal. There is no distension.     Palpations: Abdomen is soft.     Tenderness: There is no abdominal tenderness. There is no guarding.  Musculoskeletal:     Cervical back: Normal range of motion.     Comments: Right hand: Swelling in  the fifth metacarpal bone region with mild deformity of the bone.  Skin:    General: Skin is warm and dry.  Neurological:     General: No focal deficit present.     Mental Status: She is alert and oriented  to person, place, and time.     Labs on Admission: I have personally reviewed following labs and imaging studies  CBC: Recent Labs  Lab 07/01/23 2125  WBC 9.6  NEUTROABS 7.6  HGB 10.8*  HCT 33.9*  MCV 91.1  PLT 153   Basic Metabolic Panel: Recent Labs  Lab 07/01/23 2125  NA 135  K 5.2*  CL 104  CO2 20*  GLUCOSE 184*  BUN 71*  CREATININE 4.28*  CALCIUM  9.0   GFR: CrCl cannot be calculated (Unknown ideal weight.). Liver Function Tests: No results for input(s): AST, ALT, ALKPHOS, BILITOT, PROT, ALBUMIN  in the last 168 hours. No results for input(s): LIPASE, AMYLASE in the last 168 hours. No results for input(s): AMMONIA in the last 168 hours. Coagulation Profile: No results for input(s): INR, PROTIME in the last 168 hours. Cardiac Enzymes: No results for input(s): CKTOTAL, CKMB, CKMBINDEX, TROPONINI in the last 168 hours. BNP (last 3 results) No results for input(s): PROBNP in the last 8760 hours. HbA1C: No results for input(s): HGBA1C in the last 72 hours. CBG: No results for input(s): GLUCAP in the last 168 hours. Lipid Profile: No results for input(s): CHOL, HDL, LDLCALC, TRIG, CHOLHDL, LDLDIRECT in the last 72 hours. Thyroid  Function Tests: No results for input(s): TSH, T4TOTAL, FREET4, T3FREE, THYROIDAB in the last 72 hours. Anemia Panel: No results for input(s): VITAMINB12, FOLATE, FERRITIN, TIBC, IRON, RETICCTPCT in the last 72 hours. Urine analysis:    Component Value Date/Time   COLORURINE AMBER (A) 07/01/2023 2125   APPEARANCEUR CLOUDY (A) 07/01/2023 2125   LABSPEC 1.012 07/01/2023 2125   PHURINE 6.0 07/01/2023 2125   GLUCOSEU 50 (A) 07/01/2023 2125   HGBUR NEGATIVE  07/01/2023 2125   BILIRUBINUR NEGATIVE 07/01/2023 2125   KETONESUR NEGATIVE 07/01/2023 2125   PROTEINUR 100 (A) 07/01/2023 2125   UROBILINOGEN 0.2 05/13/2008 0915   NITRITE NEGATIVE 07/01/2023 2125   LEUKOCYTESUR MODERATE (A) 07/01/2023 2125    Radiological Exams on Admission: CT Head Wo Contrast Result Date: 07/01/2023 CLINICAL DATA:  Fall EXAM: CT HEAD WITHOUT CONTRAST CT MAXILLOFACIAL WITHOUT CONTRAST CT CERVICAL SPINE WITHOUT CONTRAST TECHNIQUE: Multidetector CT imaging of the head, cervical spine, and maxillofacial structures were performed using the standard protocol without intravenous contrast. Multiplanar CT image reconstructions of the cervical spine and maxillofacial structures were also generated. RADIATION DOSE REDUCTION: This exam was performed according to the departmental dose-optimization program which includes automated exposure control, adjustment of the mA and/or kV according to patient size and/or use of iterative reconstruction technique. COMPARISON:  CT brain cervical and facial 05/13/2022 FINDINGS: CT HEAD FINDINGS Brain: No acute territorial infarction, hemorrhage or intracranial mass. Atrophy and chronic small vessel ischemic changes of the white matter. Small chronic right thalamic infarcts. Stable ventricle size. Vascular: No hyperdense vessels.  Carotid vascular calcification Skull: Normal. Negative for fracture or focal lesion. Other: None CT MAXILLOFACIAL FINDINGS Osseous: Mastoid air cells are clear. No mandibular fracture. Pterygoid plates and zygomatic arches are intact. No acute nasal bone fracture Orbits: Negative. No traumatic or inflammatory finding. Sinuses: Clear. Soft tissues: Right chin soft tissue laceration or skin wound. Stable 11 mm left parotid node or nodule. CT CERVICAL SPINE FINDINGS Alignment: Straightening of the cervical spine. No subluxation. Facet alignment is within normal limits Skull base and vertebrae: No acute fracture. No primary bone lesion or  focal pathologic process. Soft tissues and spinal canal: No prevertebral fluid or swelling. No visible canal hematoma. Disc levels: Multilevel degenerative change. Mild disc space narrowing C5-C6  and C6-C7. Mild multilevel facet degenerative changes. Upper chest: Negative. Other: None IMPRESSION: 1. No CT evidence for acute intracranial abnormality. Atrophy and chronic small vessel ischemic changes of the white matter. 2. Straightening of the cervical spine with degenerative changes. No acute osseous abnormality. 3. Right chin soft tissue laceration or skin wound. No acute facial bone fracture Electronically Signed   By: Luke Bun M.D.   On: 07/01/2023 22:11   CT Maxillofacial Wo Contrast Result Date: 07/01/2023 CLINICAL DATA:  Fall EXAM: CT HEAD WITHOUT CONTRAST CT MAXILLOFACIAL WITHOUT CONTRAST CT CERVICAL SPINE WITHOUT CONTRAST TECHNIQUE: Multidetector CT imaging of the head, cervical spine, and maxillofacial structures were performed using the standard protocol without intravenous contrast. Multiplanar CT image reconstructions of the cervical spine and maxillofacial structures were also generated. RADIATION DOSE REDUCTION: This exam was performed according to the departmental dose-optimization program which includes automated exposure control, adjustment of the mA and/or kV according to patient size and/or use of iterative reconstruction technique. COMPARISON:  CT brain cervical and facial 05/13/2022 FINDINGS: CT HEAD FINDINGS Brain: No acute territorial infarction, hemorrhage or intracranial mass. Atrophy and chronic small vessel ischemic changes of the white matter. Small chronic right thalamic infarcts. Stable ventricle size. Vascular: No hyperdense vessels.  Carotid vascular calcification Skull: Normal. Negative for fracture or focal lesion. Other: None CT MAXILLOFACIAL FINDINGS Osseous: Mastoid air cells are clear. No mandibular fracture. Pterygoid plates and zygomatic arches are intact. No acute  nasal bone fracture Orbits: Negative. No traumatic or inflammatory finding. Sinuses: Clear. Soft tissues: Right chin soft tissue laceration or skin wound. Stable 11 mm left parotid node or nodule. CT CERVICAL SPINE FINDINGS Alignment: Straightening of the cervical spine. No subluxation. Facet alignment is within normal limits Skull base and vertebrae: No acute fracture. No primary bone lesion or focal pathologic process. Soft tissues and spinal canal: No prevertebral fluid or swelling. No visible canal hematoma. Disc levels: Multilevel degenerative change. Mild disc space narrowing C5-C6 and C6-C7. Mild multilevel facet degenerative changes. Upper chest: Negative. Other: None IMPRESSION: 1. No CT evidence for acute intracranial abnormality. Atrophy and chronic small vessel ischemic changes of the white matter. 2. Straightening of the cervical spine with degenerative changes. No acute osseous abnormality. 3. Right chin soft tissue laceration or skin wound. No acute facial bone fracture Electronically Signed   By: Luke Bun M.D.   On: 07/01/2023 22:11   CT Cervical Spine Wo Contrast Result Date: 07/01/2023 CLINICAL DATA:  Fall EXAM: CT HEAD WITHOUT CONTRAST CT MAXILLOFACIAL WITHOUT CONTRAST CT CERVICAL SPINE WITHOUT CONTRAST TECHNIQUE: Multidetector CT imaging of the head, cervical spine, and maxillofacial structures were performed using the standard protocol without intravenous contrast. Multiplanar CT image reconstructions of the cervical spine and maxillofacial structures were also generated. RADIATION DOSE REDUCTION: This exam was performed according to the departmental dose-optimization program which includes automated exposure control, adjustment of the mA and/or kV according to patient size and/or use of iterative reconstruction technique. COMPARISON:  CT brain cervical and facial 05/13/2022 FINDINGS: CT HEAD FINDINGS Brain: No acute territorial infarction, hemorrhage or intracranial mass. Atrophy and  chronic small vessel ischemic changes of the white matter. Small chronic right thalamic infarcts. Stable ventricle size. Vascular: No hyperdense vessels.  Carotid vascular calcification Skull: Normal. Negative for fracture or focal lesion. Other: None CT MAXILLOFACIAL FINDINGS Osseous: Mastoid air cells are clear. No mandibular fracture. Pterygoid plates and zygomatic arches are intact. No acute nasal bone fracture Orbits: Negative. No traumatic or inflammatory finding. Sinuses: Clear. Soft tissues:  Right chin soft tissue laceration or skin wound. Stable 11 mm left parotid node or nodule. CT CERVICAL SPINE FINDINGS Alignment: Straightening of the cervical spine. No subluxation. Facet alignment is within normal limits Skull base and vertebrae: No acute fracture. No primary bone lesion or focal pathologic process. Soft tissues and spinal canal: No prevertebral fluid or swelling. No visible canal hematoma. Disc levels: Multilevel degenerative change. Mild disc space narrowing C5-C6 and C6-C7. Mild multilevel facet degenerative changes. Upper chest: Negative. Other: None IMPRESSION: 1. No CT evidence for acute intracranial abnormality. Atrophy and chronic small vessel ischemic changes of the white matter. 2. Straightening of the cervical spine with degenerative changes. No acute osseous abnormality. 3. Right chin soft tissue laceration or skin wound. No acute facial bone fracture Electronically Signed   By: Luke Bun M.D.   On: 07/01/2023 22:11   DG Pelvis 1-2 Views Result Date: 07/01/2023 CLINICAL DATA:  Recent fall with pelvic pain, initial encounter EXAM: PELVIS - 1 VIEW COMPARISON:  None Available. FINDINGS: Pelvic ring is intact. Postsurgical changes are noted in the proximal right femur. Acute fracture or dislocation is seen. No soft tissue abnormality is noted. IMPRESSION: No acute abnormality noted. Electronically Signed   By: Oneil Devonshire M.D.   On: 07/01/2023 21:41   DG Knee Complete 4 Views  Right Result Date: 07/01/2023 CLINICAL DATA:  Recent fall with right knee pain, initial encounter EXAM: RIGHT KNEE - COMPLETE 4+ VIEW COMPARISON:  None FINDINGS: Medullary rod is noted in the distal right femur. Transverse fracture through the midportion of the patella is noted with approximately 8 mm displacement of the fracture fragments. No other fracture or dislocation is seen. No soft tissue changes are noted. IMPRESSION: Mid patellar fracture. Electronically Signed   By: Oneil Devonshire M.D.   On: 07/01/2023 21:40   DG Chest Portable 1 View Result Date: 07/01/2023 CLINICAL DATA:  Recent fall with chest pain, initial encounter EXAM: PORTABLE CHEST 1 VIEW COMPARISON:  05/13/2023 FINDINGS: Cardiac shadow is enlarged. Pacing device is again seen. Findings of prior TAVR are noted. Aortic calcifications are seen. The lungs are clear bilaterally. No bony abnormality is noted. IMPRESSION: No acute abnormality noted. Electronically Signed   By: Oneil Devonshire M.D.   On: 07/01/2023 21:39    EKG: Independently reviewed.  Paced rhythm, LBBB is not new, QTc 529.  T wave inversions inferolaterally new compared to previous EKG from February 2024.   Assessment and Plan  Mild right patellar fracture Secondary to a mechanical fall.  Orthopedics consulted by ED physician and patient placed in knee immobilizer.  Continue pain management.  Possible UTI Patient is endorsing urinary symptoms.  UA with negative nitrite, moderate leukocytes, and microscopy showing 11-20 WBCs and rare bacteria.  No fever, leukocytosis, or signs of sepsis.  Continue ceftriaxone  and follow-up urine culture.  Acute metabolic encephalopathy Possibly related to UTI.  Per ED report, family was concerned that patient has been more confused over the past few days.  At present, patient is AAOx4 and answering questions appropriately.  CT head showing no acute intracranial abnormality and no obvious focal neurodeficit on exam.  AKI on CKD stage  IV Mild metabolic acidosis Borderline hyperkalemia BUN 71, creatinine 4.28 (baseline 3.1-3.6), bicarb 20, potassium 5.2.  AKI possibly prerenal in etiology.  Start gentle IV fluid hydration and and monitor labs.  Avoid nephrotoxic agents.  Continue sodium bicarbonate  supplement.  Repeat metabolic panel currently in process, treat if still hyperkalemic.  Right hand pain Swelling  and mild deformity of the bone noted in the fifth metacarpal region.  Stat x-ray ordered to rule out fracture.  Abnormal EKG History of CAD EKG showing new T wave inversions inferolaterally.  Troponin 29.  ACS less likely as patient is not endorsing chest pain.  Trend troponin.  Depression Continue Lexapro .  Type 2 diabetes Last A1c 7.1 in March 2023, repeat ordered.  Glucose in the 180s.  Placed on sensitive sliding scale insulin .  Hypertension Continue Coreg  and Imdur .  Hyperlipidemia Continue Lipitor and Zetia .  Hypothyroidism Continue Synthroid  and check TSH.  Chronic anemia Hemoglobin stable, continue to monitor labs.  DVT prophylaxis: SCDs Code Status: Full Code (discussed with the patient) Family Communication: No family available at this time. Level of care: Telemetry bed Admission status: It is my clinical opinion that referral for OBSERVATION is reasonable and necessary in this patient based on the above information provided. The aforementioned taken together are felt to place the patient at high risk for further clinical deterioration. However, it is anticipated that the patient may be medically stable for discharge from the hospital within 24 to 48 hours.  Editha Ram MD [facility]  If 7PM-7AM, please contact night-coverage [email or web address]  07/02/2023, 3:46 AM

## 2023-07-02 NOTE — Progress Notes (Signed)
 PT Cancellation Note  Patient Details Name: Zaila Crew MRN: [id number] DOB: 1931/12/08   Cancelled Treatment:     PT order received but eval deferred 2* pt elevated pain level followed by lethargy following IV pain meds.  Will follow.   Trinady Milewski 07/02/2023, 3:09 PM

## 2023-07-02 NOTE — Progress Notes (Signed)
 Patient ID: Molly Cobb, female   DOB: 20-Jun-1932, 89 y.o.   MRN: [id number] I just saw a secure chat message that was left for me by the EDP from my call last evening.  I was able to review the notes on this patient as well as review the x-rays of her right knee.  She does have a nondisplaced right patella fracture.  The treatment plan should be in knee immobilizer at all times with no right knee flexion until further notice.  She can weight-bear as tolerated in the knee immobilizer.  She needs to have outpatient follow-up in our office in a week for repeat x-rays.

## 2023-07-02 NOTE — Hospital Course (Addendum)
 The patient is an elderly 89 year old AAF with history of CKD stage IV, CAD, depression, DM2, gout, HTN, hypothyroidism, severe AAS status post TAVR, history of pacemaker, chronic anemia presented to the ED from [facility] for evaluation of right knee and facial pain after a fall.  Per family patient has been more confused over the past few days. Chest x-ray, x-ray pelvis, CT head, cervical spine, CT maxillofacial did not show any fracture or acute trauma.  There was small laceration of the skin noted over the right chin.  X-ray of the right knee showed mild patellar fracture.  Dr. Vernetta from orthopedic was consulted and patient was placed in knee immobilizer and the recommendation is for her to have her knee stay fully extended for the next 6 weeks to allow the fracture to heal with weightbearing as tolerated knee brace and outpatient follow-up in 1 to 2 weeks for repeat knee x-rays. PT/OT is recommending SNF, awaiting placement at this time but in the time that she has been waiting for placement to SNF she became encephalopathic and very somnolent and drowsy the last few days so she was transferred to the progressive care unit for closer observation and will obtain SLP evaluation and restart antibiotics for possible aspiration and pneumonia.  We will also get an EEG for further evaluation and this has been done and showed that the patient has mild to moderate diffuse encephalopathy with no seizures or epileptiform discharges seen throughout the recording.  The last few days she had been extremely somnolent drowsy in the mornings and wakes up in the evenings but now she is much more awake and alert today.  She appears back to her baseline is medically stable for discharge and will SNF placement however there was an issue with the insurance authorization so bed will be available later in the week.  Assessment & Plan:  Principal Problem:   Patella fracture Active Problems:   Hypothyroidism  (acquired)   Type 2 diabetes mellitus with stage 4 chronic kidney disease, without long-term current use of insulin  (HCC)   CKD (chronic kidney disease) stage 4, GFR 15-29 ml/min (HCC)   UTI (urinary tract infection)   Acute metabolic encephalopathy   AKI (acute kidney injury) (HCC)   Metabolic acidosis   Hyperkalemia   Mild Right Patellar Fracture Mechanical fall -Secondary to a mechanical fall.  Orthopedics, Dr. Vernetta, consulted. -Recommend Knee immobilizer for 6 weeks -PT/OT evaluated and SNF once she is stable for discharge but there was an issue with the patient's insurance not being in network for her to SNF selected so a case worker has spoken with the patient daughter and have requested and was firm but performed better would not be available till later this week -B12, vitamin D  normal.  Constipation -Bowel regimen initiated and will continue   Urinary Tract Infection -Completed 5 days of antibiotics on 1/7 but restarted as below for Infiltrate noted on CXR  Atypical Right-sided Chest Pain -Musculoskeletal in nature secondary to her fall.  Initial x-ray was negative, will repeat X-Ray done yesterday -Add Lidocaine  Patch and Diclofenac  Gel   Acute Metabolic Encephalopathy and Somnolence and Drowsiness, improved -On admission was Possibly related to UTI.  On 07/09/23 she was very Somnolent and Drowsy but subsequently woke up in the afternoon; today she is getting very sleepy and somnolent drowsy and difficult to arouse -Per ED report, family was concerned that patient has been more confused over the past few days but over the last few days she  was A and O x4; This AM she was extremely somnolent and drowsy -Check ABG and she was not hypercarbic and Ammonia Level normal -Hold Narcotics -Workup as above and because she was still somnolent we transferred her to the progressive care unit and will obtain EEG -EEG Done and showed This study is suggestive of mild to moderate diffuse  encephalopathy. No seizures or epileptiform discharges were seen throughout the recording. -For the last 3 days patient has been extremely somnolent and drowsy during the morning but then wakes up in the afternoon and is fully alert; will need to place her on delirium precautions and ensure that she has a proper sleep-wake cycle -CT head showing no acute intracranial abnormality and no obvious focal neurodeficit on exam. -Appears back to her baseline 07/12/23 and is much more awake and alert    CKD stage V, stable Mild Metabolic Acidosis, mild hyperkalemia; chronic Uremia -Hyperkalemia resolved and metabolic acidosis stabilizing.  -Baseline creatinine around 3.8-4.5.  BUN is also elevated.  Gentle hydration as needed -BUN/Cr Trend: Recent Labs  Lab 07/06/23 0335 07/07/23 0318 07/08/23 0337 07/09/23 0425 07/10/23 0358 07/11/23 0459 07/12/23 0500  BUN 72* 77* 81* 80* 81* 83* 79*  CREATININE 3.45* 4.25* 3.65* 3.42* 3.06* 3.14* 3.09*  -K+ Trend: Recent Labs  Lab 07/06/23 0335 07/07/23 0318 07/08/23 0337 07/09/23 0425 07/10/23 0358 07/11/23 0459 07/12/23 0500  K 4.1 4.3 4.2 4.6 4.5 5.0 5.0  -Patient's Metabolic Acidosis shows now a CO2 of 17, anion gap of 9, chloride level 109; C/w increased sodium bicarbonate  to 1300 p.o. twice daily and 650 mg p.o. nightly -IVF now stopped -Avoid Nephrotoxic Medications, Contrast Dyes, Hypotension and Dehydration to Ensure Adequate Renal Perfusion and will need to Renally Adjust Meds -Continue to Monitor and Trend Renal Function carefully and repeat CMP in the AM  -Continue outpatient sodium bicarb -Follows with Nephrology at [facility]   Right Hand Pain -C/w Supportive care, x-rays negative   Abnormal EKG History of CAD Severe AS s/p TAVR -EKG showing new T wave inversions inferolaterally.  Troponins overall remains flat -Has had Right sided CP -Continue to Monitor for CP as she denied any today   Depression -Continue Escitalopram   10 mg p every Morning .  Hyponatremia -Na+ Trend: Recent Labs  Lab 07/06/23 0335 07/07/23 0318 07/08/23 0337 07/09/23 0425 07/10/23 0358 07/11/23 0459 07/12/23 0500  NA 131* 133* 133* 136 134* 138 135  -Continue to Monitor and Trend and repeat CMP in the AM   Type 2 Diabetes Mellitus  -On Sensitive sliding scale and Accu-Cheks -CBG Trend: Recent Labs  Lab 07/11/23 1625 07/11/23 1959 07/12/23 0008 07/12/23 0427 07/12/23 0729 07/12/23 1129 07/12/23 1618  GLUCAP 260* 142* 113* 129* 129* 138* 228*   Hypertension -Continue Carvedilol  12.5 mg po BID and and Isosorbide  Mononitrate 30 mg po Daily.  -C/w IV Hydralazine  10 mg q4hprn SBP >180 -Continue to Monitor BP Per Protocol  -Last BP Reading was 130/50  Leukocytosis, improved -WBC Trend: Recent Labs  Lab 07/06/23 0335 07/07/23 0318 07/08/23 0337 07/09/23 0425 07/10/23 0358 07/11/23 0459 07/12/23 0500  WBC 8.8 9.5 9.6 13.0* 10.0 8.4 9.4  -Repeat Blood Cx x2 on 1/11 showed NGTD at 3 Days -She was placed back on doxycycline  and ceftriaxone  for now and will continue antibiotics for 5 days total and this is day 4 of 5 -SLP evaluated and recommending placing back on a regular diet with thin liquids -Repeat CXR done yesterday showed no acute cardiopulmonary process -Repeat U/A  done showed moderate hemoglobin, small leukocytes, negative nitrites, 30 protein, rare bacteria, 0-5 RBCs per high-power field, 0-5 squamous of the cells and 6-10 WBCs with urine culture pending -Continue to Monitor and repeat CXR in the AM and will need an amatory home O2 screen prior to discharge   Hyperlipidemia -Continue Atorvastatin  40 mg po Daily and Ezetimibe  10 mg po every Evening but may need to hold given mildly elevated AST  Elevated AST -Mild. AST trend: Recent Labs  Lab 06/17/23 0158 07/09/23 0425 07/10/23 0358 07/11/23 0459 07/12/23 0500  AST 32 73* 72* 52* 57*  -Continue to Monitor and Trend and repeat CMP in the AM   . Hypothyroidism -Elevated TSH on admission of 38.309, normal free T4 of 0.89.  Increased Levothyroxine  to 100 mcg. -Repeat TSH is now 20.957 on Last Check  -Repeat TFTs outpatient in 4 weeks   Chronic Normocytic Anemia -Hgb/Hct Trend: Recent Labs  Lab 07/06/23 0335 07/07/23 0318 07/08/23 0337 07/09/23 0425 07/10/23 0358 07/11/23 0459 07/12/23 0500  HGB 8.6* 8.0* 8.3* 8.3* 8.4* 8.1* 7.7*  HCT 27.2* 25.4* 26.8* 26.0* 26.8* 25.2* 24.5*  MCV 90.4 90.4 90.8 89.3 90.2 90.6 90.1  -Check Anemia Panel in the AM -Continue to Monitor and Trend and repeat CBC in the AM   Hypoalbuminemia -Patient's Albumin  Trend: Recent Labs  Lab 06/17/23 0158 07/09/23 0425 07/10/23 0358 07/11/23 0459 07/12/23 0500  ALBUMIN  3.1* 3.0* 2.9* 2.6* 2.7*  -Continue to Monitor and Trend and repeat CMP in the AM  Class I Obesity -Complicates overall prognosis and care -Estimated body mass index is 30.11 kg/m as calculated from the following:   Height as of this encounter: 5' 3 (1.6 m).   Weight as of this encounter: 77.1 kg.  -Weight Loss and Dietary Counseling given  PT/OT-SNF -TOC consulted and awaiting for Insurance Authorization

## 2023-07-02 NOTE — Progress Notes (Signed)
 OT Cancellation Note  Patient Details Name: Molly Cobb MRN: [id number] DOB: [birth year]   Cancelled Treatment:    Reason Eval/Treat Not Completed: Pain limiting ability to participate Patient is lethargic with recent pain medication administration after increased pain transitioning to the floor. OT to continue to follow and check back on 07/03/23 as schedule will allow.  Geofm LEYLAND, MS Acute Rehabilitation Department Office# [phone]   07/02/2023, 2:55 PM

## 2023-07-03 DIAGNOSIS — S82034A Nondisplaced transverse fracture of right patella, initial encounter for closed fracture: Secondary | ICD-10-CM

## 2023-07-03 DIAGNOSIS — S82001A Unspecified fracture of right patella, initial encounter for closed fracture: Secondary | ICD-10-CM | POA: Diagnosis not present

## 2023-07-03 LAB — CBC
HCT: 31.1 % — ABNORMAL LOW (ref 36.0–46.0)
Hemoglobin: 9.8 g/dL — ABNORMAL LOW (ref 12.0–15.0)
MCH: 28.8 pg (ref 26.0–34.0)
MCHC: 31.5 g/dL (ref 30.0–36.0)
MCV: 91.5 fL (ref 80.0–100.0)
Platelets: 147 10*3/uL — ABNORMAL LOW (ref 150–400)
RBC: 3.4 MIL/uL — ABNORMAL LOW (ref 3.87–5.11)
RDW: 15.4 % (ref 11.5–15.5)
WBC: 10.7 10*3/uL — ABNORMAL HIGH (ref 4.0–10.5)
nRBC: 0 % (ref 0.0–0.2)

## 2023-07-03 LAB — BASIC METABOLIC PANEL
Anion gap: 10 (ref 5–15)
BUN: 63 mg/dL — ABNORMAL HIGH (ref 8–23)
CO2: 19 mmol/L — ABNORMAL LOW (ref 22–32)
Calcium: 8.8 mg/dL — ABNORMAL LOW (ref 8.9–10.3)
Chloride: 107 mmol/L (ref 98–111)
Creatinine, Ser: 3.76 mg/dL — ABNORMAL HIGH (ref 0.44–1.00)
GFR, Estimated: 11 mL/min — ABNORMAL LOW (ref >60)
Glucose, Bld: 146 mg/dL — ABNORMAL HIGH (ref 70–99)
Potassium: 4.9 mmol/L (ref 3.5–5.1)
Sodium: 136 mmol/L (ref 135–145)

## 2023-07-03 LAB — MAGNESIUM: Magnesium: 2.1 mg/dL (ref 1.7–2.4)

## 2023-07-03 LAB — GLUCOSE, CAPILLARY
Glucose-Capillary: 111 mg/dL — ABNORMAL HIGH (ref 70–99)
Glucose-Capillary: 136 mg/dL — ABNORMAL HIGH (ref 70–99)
Glucose-Capillary: 141 mg/dL — ABNORMAL HIGH (ref 70–99)
Glucose-Capillary: 191 mg/dL — ABNORMAL HIGH (ref 70–99)
Glucose-Capillary: 230 mg/dL — ABNORMAL HIGH (ref 70–99)
Glucose-Capillary: 93 mg/dL (ref 70–99)

## 2023-07-03 LAB — PHOSPHORUS: Phosphorus: 4 mg/dL (ref 2.5–4.6)

## 2023-07-03 MED ORDER — BOOST / RESOURCE BREEZE PO LIQD CUSTOM
1.0000 | Freq: Three times a day (TID) | ORAL | Status: DC
  Administered 2023-07-03 – 2023-07-05 (×5): 1 via ORAL
  Administered 2023-07-06: 237 mL via ORAL
  Administered 2023-07-06 – 2023-07-13 (×17): 1 via ORAL

## 2023-07-03 MED ORDER — LEVOTHYROXINE SODIUM 100 MCG PO TABS
100.0000 ug | ORAL_TABLET | Freq: Every day | ORAL | Status: DC
  Administered 2023-07-04 – 2023-07-13 (×10): 100 ug via ORAL
  Filled 2023-07-03 (×10): qty 1

## 2023-07-03 NOTE — Consult Note (Signed)
 Consultation Note  Reason for consultation: Right patella fracture Consulting provider: Darryle Law, EDP  HPI: The patient is an 89 year old female who sustained a mechanical fall 2 days ago injuring her right patella.  She sustained a nondisplaced right patella fracture and was appropriately placed in a knee immobilizer.  She is a resident of a skilled nursing facility.  She actually was admitted due to a UTI.  She has had previous hip fracture and there is retained hardware in her right femur.  I was able to review the x-rays of her right knee and it does show a nondisplaced patella fracture.  She does report some knee pain.  She is in a knee immobilizer currently.  I was able to review all of her notes within epic.  Exam: On examination there is no significant knee joint effusion of her right knee.  There is some pain to palpation over the patella but overall gross of the patella is intact.  Assessment: Nondisplaced right patella fracture  Recommendations: She definitely needs to have her knee stay fully extended for the next 6 weeks to allow the fracture to heal.  I will order a Bledsoe knee brace locked at full extension that may be more comfortable.  She can weight-bear as tolerated in the knee brace.  We will need to see her back in the office in 1 to 2 weeks for repeat x-rays.

## 2023-07-03 NOTE — Evaluation (Signed)
 Occupational Therapy Evaluation Patient Details Name: Molly Cobb MRN: [id number] DOB: Jun 15, 1932 Today's Date: 07/03/2023   History of Present Illness Patient is a 89 year old female who presented on 1/3 with pain in R knee and face after fall. Patient was found to have mild patellar fracture, UTI, and acute metabolic encephalopathy. PMH: CKD, CAD, depression, type II DM, gout, hypertension, hypothyroidism, severe aortic stenosis with history of TAVR, history of pacemaker placement, hypothyroidism.   Clinical Impression   Patient is a 89 year old female who was admitted for above. Patient was living at ALF with unclear level of assistance as patient reported  I don't remember when asked PLOF questions. No family available at this time. Patient was +2 to advance to recliner in room with increased time and brace adjustment needed. Patient was noted to have decreased functional activity tolerance, decreased endurance, decreased standing balance, decreased safety awareness, and decreased knowledge of AD/AE impacting participation in ADLs. Patient would continue to benefit from skilled OT services at this time while admitted and after d/c to address noted deficits in order to improve overall safety and independence in ADLs. Patient will benefit from continued inpatient follow up therapy, <3 hours/day.        If plan is discharge home, recommend the following: Assistance with cooking/housework;Direct supervision/assist for medications management;Assist for transportation;Help with stairs or ramp for entrance;Direct supervision/assist for financial management;A lot of help with bathing/dressing/bathroom;A lot of help with walking and/or transfers    Functional Status Assessment  Patient has had a recent decline in their functional status and demonstrates the ability to make significant improvements in function in a reasonable and predictable amount of time.  Equipment Recommendations  None recommended  by OT       Precautions / Restrictions Precautions Precautions: Fall Restrictions Weight Bearing Restrictions Per Provider Order: Yes RLE Weight Bearing Per Provider Order: Weight bearing as tolerated Other Position/Activity Restrictions: with bledsoe brace      Mobility Bed Mobility Overal bed mobility: Needs Assistance Bed Mobility: Supine to Sit     Supine to sit: Max assist, +2 for safety/equipment     General bed mobility comments: with increased time.          Balance Overall balance assessment: Needs assistance Sitting-balance support: No upper extremity supported, Feet supported Sitting balance-Leahy Scale: Good     Standing balance support: Bilateral upper extremity supported Standing balance-Leahy Scale: Poor                             ADL either performed or assessed with clinical judgement   ADL Overall ADL's : Needs assistance/impaired Eating/Feeding: Set up;Sitting   Grooming: Sitting;Minimal assistance   Upper Body Bathing: Sitting;Minimal assistance   Lower Body Bathing: Total assistance;Sitting/lateral leans   Upper Body Dressing : Minimal assistance;Sitting   Lower Body Dressing: Sitting/lateral leans;Maximal assistance   Toilet Transfer: Minimal assistance;+2 for safety/equipment;Ambulation;Rolling walker (2 wheels) Toilet Transfer Details (indicate cue type and reason): with increased time and cues for positioning of RLE Toileting- Clothing Manipulation and Hygiene: Maximal assistance;Sit to/from stand               Vision   Additional Comments: patient was lethargic but able to track therapists when speaking to them            Pertinent Vitals/Pain Pain Assessment Pain Assessment: Faces Faces Pain Scale: Hurts little more Pain Location: R LE Pain Descriptors / Indicators: Grimacing Pain Intervention(s):  Limited activity within patient's tolerance, Monitored during session, Premedicated before session      Extremity/Trunk Assessment Upper Extremity Assessment Upper Extremity Assessment: Overall WFL for tasks assessed   Lower Extremity Assessment Lower Extremity Assessment: RLE deficits/detail RLE Deficits / Details: No ROM at knee per order; strength pain limited          Cognition Arousal: Lethargic Behavior During Therapy: WFL for tasks assessed/performed Overall Cognitive Status: Difficult to assess                                 General Comments: patient was lethargic during session. would need to see patient more alert ro be able to engage in cog assessment.                Home Living Family/patient expects to be discharged to:: Skilled nursing facility                                        Prior Functioning/Environment Prior Level of Function : Needs assist               ADLs Comments: patient reported that she does not remember how much assitance she needed before coming to the hosptial. chart noted that patient was at [facility] prior to this admission. no family in room to assist with PLOF        OT Problem List: Impaired balance (sitting and/or standing);Decreased knowledge of precautions;Cardiopulmonary status limiting activity;Decreased knowledge of use of DME or AE;Decreased activity tolerance;Decreased safety awareness;Pain      OT Treatment/Interventions: Self-care/ADL training;DME and/or AE instruction;Neuromuscular education;Patient/family education;Balance training;Therapeutic activities    OT Goals(Current goals can be found in the care plan section) Acute Rehab OT Goals Patient Stated Goal: to get better OT Goal Formulation: Patient unable to participate in goal setting Time For Goal Achievement: 07/17/23 Potential to Achieve Goals: Fair  OT Frequency: Min 1X/week    Co-evaluation              AM-PAC OT 6 Clicks Daily Activity     Outcome Measure Help from another person eating meals?: A  Little Help from another person taking care of personal grooming?: A Little Help from another person toileting, which includes using toliet, bedpan, or urinal?: A Lot Help from another person bathing (including washing, rinsing, drying)?: A Lot Help from another person to put on and taking off regular upper body clothing?: A Little Help from another person to put on and taking off regular lower body clothing?: A Lot 6 Click Score: 15   End of Session Equipment Utilized During Treatment: Gait belt;Rolling walker (2 wheels);Other (comment) (bledsoe brace) Nurse Communication: Mobility status  Activity Tolerance: Patient tolerated treatment well Patient left: in chair;with call bell/phone within reach;with chair alarm set  OT Visit Diagnosis: Unsteadiness on feet (R26.81);Other abnormalities of gait and mobility (R26.89);Pain                Time: 8866-8847 OT Time Calculation (min): 19 min Charges:  OT General Charges $OT Visit: 1 Visit OT Evaluation $OT Eval Moderate Complexity: 1 Mod  Anwen Cannedy OTR/L, MS Acute Rehabilitation Department Office# [phone]   Geofm [name] Dance 07/03/2023, 3:57 PM

## 2023-07-03 NOTE — Discharge Instructions (Signed)
 The right leg and knee need to stay fully extended for the next 6 weeks. The knee brace should stay on at all times. The brace can be removed for hygiene purposes and to make sure there is no skin breakdown being caused by the brace. The patient can weight-bear as tolerated in the knee brace.

## 2023-07-03 NOTE — Progress Notes (Addendum)
 The patient just had an 11 beat run of Vtach. Also her telemetry order is expiring. Messaged Chinita Greenland.

## 2023-07-03 NOTE — Progress Notes (Signed)
 Physical Therapy Treatment Patient Details Name: Molly Cobb MRN: [id number] DOB: [birth year] Today's Date: 07/03/2023   History of Present Illness Patient is a 89 year old female who presented on 1/3 with pain in R knee and face after fall. Patient was found to have mild patellar fracture, UTI, and acute metabolic encephalopathy. PMH: CKD, CAD, depression, type II DM, gout, hypertension, hypothyroidism, severe aortic stenosis with history of TAVR, history of pacemaker placement, hypothyroidism.    PT Comments  Pt found in room sitting edge of recliner with stool down and attempting to get up to go to the bathroom.  Pt assisted to [facility], CNA assisted with hygiene, and pt up to ambulate a short distance to return to bed.  Pt pleasant but more confused this pm and disoriented to place and situation - wondering why her leg hurts.    If plan is discharge home, recommend the following: A lot of help with walking and/or transfers;A lot of help with bathing/dressing/bathroom;Assistance with cooking/housework;Assist for transportation;Help with stairs or ramp for entrance   Can travel by private vehicle     No  Equipment Recommendations  None recommended by PT    Recommendations for Other Services       Precautions / Restrictions Precautions Precautions: Fall Restrictions Weight Bearing Restrictions Per Provider Order: Yes RLE Weight Bearing Per Provider Order: Weight bearing as tolerated Other Position/Activity Restrictions: with bledsoe brace     Mobility  Bed Mobility Overal bed mobility: Needs Assistance Bed Mobility: Sit to Supine       Sit to supine: Mod assist   General bed mobility comments: increased times with cues for sequence and assist to manage bil LEs onto bed    Transfers Overall transfer level: Needs assistance Equipment used: Rolling walker (2 wheels) Transfers: Sit to/from Stand, Bed to chair/wheelchair/BSC Sit to Stand: Mod assist, +2 safety/equipment, From  elevated surface   Step pivot transfers: Mod assist       General transfer comment: Assist to bring wt up and fwd and to balance in standing.  CUes for LE management and use of UEs to self assist.  Step pvt recliner to [facility] and BSC to bedside    Ambulation/Gait Ambulation/Gait assistance: Min assist, Max assist, +2 safety/equipment Gait Distance (Feet): 3 Feet Assistive device: Rolling walker (2 wheels) Gait Pattern/deviations: Step-to pattern, Decreased step length - right, Decreased step length - left, Shuffle, Trunk flexed Gait velocity: decr     General Gait Details: cues for sequence, posture and position from [employer]             Wheelchair Mobility     Tilt Bed    Modified Rankin (Stroke Patients Only)       Balance Overall balance assessment: Needs assistance Sitting-balance support: No upper extremity supported, Feet supported Sitting balance-Leahy Scale: Good     Standing balance support: Bilateral upper extremity supported Standing balance-Leahy Scale: Poor                              Cognition Arousal: Alert Behavior During Therapy: WFL for tasks assessed/performed Overall Cognitive Status: Difficult to assess                                 General Comments: Pt confused to place and situation and wondering why her leg hurts        Exercises  General Comments        Pertinent Vitals/Pain Pain Assessment Pain Assessment: Faces Faces Pain Scale: Hurts even more Pain Location: R LE Pain Descriptors / Indicators: Grimacing, Sore Pain Intervention(s): Limited activity within patient's tolerance, Monitored during session    Home Living Family/patient expects to be discharged to:: Skilled nursing facility                        Prior Function            PT Goals (current goals can now be found in the care plan section) Acute Rehab PT Goals Patient Stated Goal: Regain IND PT Goal  Formulation: With patient Time For Goal Achievement: 07/17/23 Potential to Achieve Goals: Fair Progress towards PT goals: Progressing toward goals    Frequency    Min 1X/week      PT Plan      Co-evaluation              AM-PAC PT 6 Clicks Mobility   Outcome Measure  Help needed turning from your back to your side while in a flat bed without using bedrails?: A Lot Help needed moving from lying on your back to sitting on the side of a flat bed without using bedrails?: A Lot Help needed moving to and from a bed to a chair (including a wheelchair)?: A Lot Help needed standing up from a chair using your arms (e.g., wheelchair or bedside chair)?: A Lot Help needed to walk in hospital room?: Total Help needed climbing 3-5 steps with a railing? : Total 6 Click Score: 10    End of Session Equipment Utilized During Treatment: Gait belt Activity Tolerance: Patient tolerated treatment well;Patient limited by fatigue Patient left: in bed;with call bell/phone within reach;with bed alarm set Nurse Communication: Mobility status PT Visit Diagnosis: Difficulty in walking, not elsewhere classified (R26.2)     Time: 8387-8364 PT Time Calculation (min) (ACUTE ONLY): 23 min  Charges:    $Therapeutic Activity: 8-22 mins PT General Charges $$ ACUTE PT VISIT: 1 Visit                     Katrinka Acton PT Acute Rehabilitation Services Pager [phone] Office [phone]    Saron Vanorman 07/03/2023, 5:03 PM

## 2023-07-03 NOTE — Progress Notes (Signed)
 Paged Chinita Greenland concerning 11 beat run Vtach.

## 2023-07-03 NOTE — Progress Notes (Signed)
 Patient was assisted to bathroom by nursing tech approximately [phone]. At 1900 patient bathroom call light activated; nurse tech found patient slid down in front of bedside commode in bathroom; patient was not injured. Patient was assisted back to bed without incident; bed alarm applied and call bell in reach; Chare nurse was notified. The patient's daughter was notified via phone. Attending JINNY Kipper NP on unit and notified face to face. No new orders received. Attempted to enter safety zone but the system is currently down and would not save the entry.

## 2023-07-03 NOTE — Progress Notes (Signed)
       Overnight   NAME: Molly Cobb MRN: [id number] DOB : [birth year]    Date of Service   07/03/2023   HPI/Events of Note    Notified by RN for unwitnessed fall. Reported as assisted to the bathroom by NT on return to assist patient after activation of assistance light, patient was found sitting in front of bedside commode in bathroom.  Bedside visit: No obvious or stated injuries. Movement of extremities x 4 equal bilateral. Patient able to freely converse and accurate to her name, facility name, and year.  No deformities, contusions, abrasions, punctures, bruises, tears, lacerations, swelling. Denies head impact. No signs or symptoms of head impact. No blood thinner    Interventions/ Plan   Fall arresting pads. Bed alarm active. Continue attending previous orders.       Lynwood Kipper BSN MSNA MSN ACNPC-AG Acute Care Nurse Practitioner [facility]

## 2023-07-03 NOTE — Progress Notes (Signed)
 Orthopedic Tech Progress Note Patient Details:  Molly Cobb 1932-05-29 [id number]   Delivered Bledsoe brace to pt. Room. Informed nurse that Ortho tech do not apply this item. He stated understanding.      Tonye Pearson 07/03/2023, 10:43 AM

## 2023-07-03 NOTE — Evaluation (Signed)
 Physical Therapy Evaluation Patient Details Name: Molly Cobb MRN: [id number] DOB: Nov 10, 1931 Today's Date: 07/03/2023  History of Present Illness  Patient is a 89 year old female who presented on 1/3 with pain in R knee and face after fall. Patient was found to have mild patellar fracture, UTI, and acute metabolic encephalopathy. PMH: CKD, CAD, depression, type II DM, gout, hypertension, hypothyroidism, severe aortic stenosis with history of TAVR, history of pacemaker placement, hypothyroidism.  Clinical Impression  Pt admitted as above and presenting with functional mobility limitations 2* decreased R LE strength/ROM, R LE pain, and premorbid deconditioning.  Pt currently requires significant assist for all basic mobility tasks and Patient will benefit from continued inpatient follow up therapy, <3 hours/day.         If plan is discharge home, recommend the following: A lot of help with walking and/or transfers;A lot of help with bathing/dressing/bathroom;Assistance with cooking/housework;Assist for transportation;Help with stairs or ramp for entrance   Can travel by private vehicle   No    Equipment Recommendations None recommended by PT  Recommendations for Other Services       Functional Status Assessment Patient has had a recent decline in their functional status and demonstrates the ability to make significant improvements in function in a reasonable and predictable amount of time.     Precautions / Restrictions Precautions Precautions: Fall Restrictions Weight Bearing Restrictions Per Provider Order: Yes RLE Weight Bearing Per Provider Order: Weight bearing as tolerated Other Position/Activity Restrictions: with bledsoe brace      Mobility  Bed Mobility                    Transfers Overall transfer level: Needs assistance Equipment used: Rolling walker (2 wheels) Transfers: Sit to/from Stand Sit to Stand: Mod assist, +2 physical assistance, +2  safety/equipment, From elevated surface           General transfer comment: Assist to bring wt up and fwd and to balance in standing.  CUes for LE management and use of UEs to self assist    Ambulation/Gait Ambulation/Gait assistance: Min assist, Max assist, +2 safety/equipment Gait Distance (Feet): 8 Feet Assistive device: Rolling walker (2 wheels) Gait Pattern/deviations: Step-to pattern, Decreased step length - right, Decreased step length - left, Shuffle, Trunk flexed Gait velocity: decr     General Gait Details: cues for sequence, posture and position from [employer]            Wheelchair Mobility     Tilt Bed    Modified Rankin (Stroke Patients Only)       Balance Overall balance assessment: Needs assistance Sitting-balance support: No upper extremity supported, Feet supported Sitting balance-Leahy Scale: Good     Standing balance support: Bilateral upper extremity supported Standing balance-Leahy Scale: Poor                               Pertinent Vitals/Pain Pain Assessment Pain Assessment: Faces Faces Pain Scale: Hurts little more Pain Location: R LE Pain Descriptors / Indicators: Grimacing Pain Intervention(s): Limited activity within patient's tolerance, Premedicated before session, Monitored during session, Ice applied    Home Living Family/patient expects to be discharged to:: Skilled nursing facility                        Prior Function Prior Level of Function : Needs assist  ADLs Comments: patient reported that she does not remember how much assitance she needed before coming to the hosptial. chart noted that patient was at [facility] prior to this admission. no family in room to assist with PLOF     Extremity/Trunk Assessment   Upper Extremity Assessment Upper Extremity Assessment: Overall WFL for tasks assessed    Lower Extremity Assessment Lower Extremity Assessment: RLE  deficits/detail RLE Deficits / Details: No ROM at knee per order; strength pain limited       Communication   Communication Communication: No apparent difficulties  Cognition Arousal: Lethargic Behavior During Therapy: WFL for tasks assessed/performed Overall Cognitive Status: Difficult to assess                                 General Comments: patient was lethargic during session. would need to see patient more alert ro be able to engage in cog assessment.        General Comments      Exercises     Assessment/Plan    PT Assessment Patient needs continued PT services  PT Problem List Decreased strength;Decreased range of motion;Decreased activity tolerance;Decreased mobility;Decreased knowledge of use of DME;Pain       PT Treatment Interventions DME instruction;Gait training;Functional mobility training;Therapeutic activities;Therapeutic exercise;Balance training;Patient/family education    PT Goals (Current goals can be found in the Care Plan section)  Acute Rehab PT Goals Patient Stated Goal: Regain IND PT Goal Formulation: With patient Time For Goal Achievement: 07/17/23 Potential to Achieve Goals: Fair    Frequency Min 1X/week     Co-evaluation               AM-PAC PT 6 Clicks Mobility  Outcome Measure Help needed turning from your back to your side while in a flat bed without using bedrails?: A Lot Help needed moving from lying on your back to sitting on the side of a flat bed without using bedrails?: A Lot Help needed moving to and from a bed to a chair (including a wheelchair)?: A Lot Help needed standing up from a chair using your arms (e.g., wheelchair or bedside chair)?: A Lot Help needed to walk in hospital room?: Total Help needed climbing 3-5 steps with a railing? : Total 6 Click Score: 10    End of Session Equipment Utilized During Treatment: Gait belt Activity Tolerance: Patient tolerated treatment well;Patient limited by  fatigue Patient left: in chair;with call bell/phone within reach;with chair alarm set Nurse Communication: Mobility status PT Visit Diagnosis: Difficulty in walking, not elsewhere classified (R26.2)    Time: 8874-8844 PT Time Calculation (min) (ACUTE ONLY): 30 min   Charges:   PT Evaluation $PT Eval Low Complexity: 1 Low   PT General Charges $$ ACUTE PT VISIT: 1 Visit         Katrinka Acton PT Acute Rehabilitation Services Pager [phone] Office [phone]   Sanyla Summey 07/03/2023, 1:26 PM

## 2023-07-03 NOTE — Progress Notes (Signed)
 PROGRESS NOTE    Molly Cobb  FMW:[id number] DOB: 09/26/1931 DOA: 07/01/2023 PCP: Fredricka Richerd Ee, MD    Brief Narrative:  89 year old with history of CKD stage IV, CAD, depression, DM2, gout, HTN, hypothyroidism, severe AAS status post TAVR, history of pacemaker, chronic anemia presented to the ED from [facility] for evaluation of right knee and facial pain after a fall.  Per family patient has been more confused over the past few days.  Chest x-ray, x-ray pelvis, CT head, cervical spine, CT maxillofacial did not show any fracture or acute trauma.  There was small laceration of the skin noted over the right chin.  X-ray of the right knee showed mild patellar fracture.  Dr. Vernetta from orthopedic was consulted and patient was placed on knee immobilizer.   Assessment & Plan:  Principal Problem:   Patella fracture Active Problems:   Hypothyroidism (acquired)   Type 2 diabetes mellitus with stage 4 chronic kidney disease, without long-term current use of insulin  (HCC)   CKD (chronic kidney disease) stage 4, GFR 15-29 ml/min (HCC)   UTI (urinary tract infection)   Acute metabolic encephalopathy   AKI (acute kidney injury) (HCC)   Metabolic acidosis   Hyperkalemia    Mild right patellar fracture Mechanical fall Secondary to a mechanical fall.  Orthopedics, Dr. Vernetta, consulted. Recommend Knee immobilizer  PT/OT B12, vitamin D  normal.   Urinary tract infection Cultures growing E. coli.  Empiric IV Rocephin .   Acute metabolic encephalopathy Possibly related to UTI.  Per ED report, family was concerned that patient has been more confused over the past few days.  At present, patient is AAOx4 and answering questions appropriately.  CT head showing no acute intracranial abnormality and no obvious focal neurodeficit on exam.   AKI on CKD stage IV Mild metabolic acidosis, mild hyperkalemia Uremia BUN 71, creatinine 4.28 (baseline 3.1-3.6), improved with gentle  hydration Uremia, hyperkalemia and acidosis slowly stabilizing   Right hand pain Supportive care, x-rays negative   Abnormal EKG History of CAD EKG showing new T wave inversions inferolaterally.  Troponins overall remains flat   Depression Continue Lexapro .   Type 2 diabetes On sliding scale and Accu-Cheks   Hypertension Continue Coreg  and Imdur .  IV as needed   Hyperlipidemia Continue Lipitor and Zetia .   Hypothyroidism Elevated TSH, normal free T4.  Will increase Synthroid  to 100 mcg. Repeat TFTs outpatient in 4 weeks   Chronic anemia Hemoglobin stable, continue to monitor labs.  PT/OT-pending TOC consulted  DVT prophylaxis: SCDs Start: 07/02/23 0430    Code Status: Full Code Family Communication:  Daughter updated.  Pain control and SNF    Subjective: Feels okay no complaints  Examination:  General exam: Appears calm and comfortable ; elderly frail  Respiratory system: Clear to auscultation. Respiratory effort normal. Cardiovascular system: S1 & S2 heard, RRR. No JVD, murmurs, rubs, gallops or clicks. No pedal edema. Gastrointestinal system: Abdomen is nondistended, soft and nontender. No organomegaly or masses felt. Normal bowel sounds heard. Central nervous system: Alert and oriented. No focal neurological deficits. Extremities: RLE knee immobilizer in place.  Skin: No rashes, lesions or ulcers Psychiatry: Judgement and insight appear normal. Mood & affect appropriate.                Diet Orders (From admission, onward)     Start     Ordered   07/02/23 1442  Diet Carb Modified Fluid consistency: Thin; Room service appropriate? Yes  Diet effective now  Question Answer Comment  Diet-HS Snack? Nothing   Calorie Level Medium 1600-2000   Fluid consistency: Thin   Room service appropriate? Yes      07/02/23 1441            Objective: Vitals:   07/02/23 1841 07/02/23 2100 07/03/23 0357 07/03/23 0556  BP: (!) 148/58 (!) 141/46  (!) 143/65 (!) 176/65  Pulse: 72 72 73 70  Resp: 19 15 18 16   Temp: 98.2 F (36.8 C) 97.6 F (36.4 C) 98.2 F (36.8 C) 98.7 F (37.1 C)  TempSrc: Oral Oral Oral Oral  SpO2: 96% 98% 100% 99%    Intake/Output Summary (Last 24 hours) at 07/03/2023 1036 Last data filed at 07/03/2023 0631 Gross per 24 hour  Intake 676.55 ml  Output 1000 ml  Net -323.45 ml   There were no vitals filed for this visit.  Scheduled Meds:  atorvastatin   40 mg Oral QPM   carvedilol   12.5 mg Oral BID WC   docusate sodium   100 mg Oral BID   escitalopram   10 mg Oral q morning   ezetimibe   10 mg Oral QPM   feeding supplement  1 Container Oral TID BM   insulin  aspart  0-9 Units Subcutaneous Q4H   isosorbide  mononitrate  30 mg Oral q morning   [START ON 07/04/2023] levothyroxine   100 mcg Oral Q0600   sodium bicarbonate   650 mg Oral Daily   And   sodium bicarbonate   1,300 mg Oral QHS   Continuous Infusions:  cefTRIAXone  (ROCEPHIN )  IV 1 g (07/02/23 2016)    Nutritional status     There is no height or weight on file to calculate BMI.  Data Reviewed:   CBC: Recent Labs  Lab 07/01/23 2125 07/02/23 0442 07/03/23 0415  WBC 9.6 13.3* 10.7*  NEUTROABS 7.6  --   --   HGB 10.8* 10.2* 9.8*  HCT 33.9* 31.8* 31.1*  MCV 91.1 90.3 91.5  PLT 153 166 147*   Basic Metabolic Panel: Recent Labs  Lab 07/01/23 2125 07/02/23 0432 07/03/23 0415  NA 135 136 136  K 5.2* 4.9 4.9  CL 104 107 107  CO2 20* 20* 19*  GLUCOSE 184* 144* 146*  BUN 71* 70* 63*  CREATININE 4.28* 4.07* 3.76*  CALCIUM  9.0 8.8* 8.8*  MG  --   --  2.1  PHOS  --   --  4.0   GFR: CrCl cannot be calculated (Unknown ideal weight.). Liver Function Tests: No results for input(s): AST, ALT, ALKPHOS, BILITOT, PROT, ALBUMIN  in the last 168 hours. No results for input(s): LIPASE, AMYLASE in the last 168 hours. Recent Labs  Lab 07/02/23 1402  AMMONIA 17   Coagulation Profile: No results for input(s): INR, PROTIME in  the last 168 hours. Cardiac Enzymes: No results for input(s): CKTOTAL, CKMB, CKMBINDEX, TROPONINI in the last 168 hours. BNP (last 3 results) No results for input(s): PROBNP in the last 8760 hours. HbA1C: No results for input(s): HGBA1C in the last 72 hours. CBG: Recent Labs  Lab 07/02/23 1545 07/02/23 2052 07/02/23 2341 07/03/23 0336 07/03/23 0723  GLUCAP 129* 177* 147* 141* 136*   Lipid Profile: No results for input(s): CHOL, HDL, LDLCALC, TRIG, CHOLHDL, LDLDIRECT in the last 72 hours. Thyroid  Function Tests: Recent Labs    07/02/23 0507 07/02/23 0916  TSH 38.309*  --   FREET4  --  0.89   Anemia Panel: Recent Labs    07/02/23 0916  VITAMINB12 1,063*   Sepsis Labs: No  results for input(s): PROCALCITON, LATICACIDVEN in the last 168 hours.  Recent Results (from the past 240 hours)  Urine Culture     Status: Abnormal (Preliminary result)   Collection Time: 07/01/23  9:42 PM   Specimen: Urine, Clean Catch  Result Value Ref Range Status   Specimen Description   Final    URINE, CLEAN CATCH Performed at [facility], [address] [place], [place] [address]    Special Requests   Final    NONE Performed at [facility], [address] [place], [place] [address]    Culture (A)  Final    >=100,000 COLONIES/mL ESCHERICHIA COLI SUSCEPTIBILITIES TO FOLLOW Performed at [facility], [address] [place], [place] [address]    Report Status PENDING  Incomplete         Radiology Studies: DG Hand Complete Right Result Date: 07/02/2023 CLINICAL DATA:  Swelling with bone deformity EXAM: RIGHT HAND - COMPLETE 3 VIEW COMPARISON:  None Available. FINDINGS: There is no evidence of fracture or dislocation. Generalized osteopenia and degenerative interphalangeal joint narrowing, greatest at the fifth DIP joint. IMPRESSION: No acute or focal finding. Electronically Signed   By: Dorn Roulette M.D.   On:  07/02/2023 05:51   CT Head Wo Contrast Result Date: 07/01/2023 CLINICAL DATA:  Fall EXAM: CT HEAD WITHOUT CONTRAST CT MAXILLOFACIAL WITHOUT CONTRAST CT CERVICAL SPINE WITHOUT CONTRAST TECHNIQUE: Multidetector CT imaging of the head, cervical spine, and maxillofacial structures were performed using the standard protocol without intravenous contrast. Multiplanar CT image reconstructions of the cervical spine and maxillofacial structures were also generated. RADIATION DOSE REDUCTION: This exam was performed according to the departmental dose-optimization program which includes automated exposure control, adjustment of the mA and/or kV according to patient size and/or use of iterative reconstruction technique. COMPARISON:  CT brain cervical and facial 05/13/2022 FINDINGS: CT HEAD FINDINGS Brain: No acute territorial infarction, hemorrhage or intracranial mass. Atrophy and chronic small vessel ischemic changes of the white matter. Small chronic right thalamic infarcts. Stable ventricle size. Vascular: No hyperdense vessels.  Carotid vascular calcification Skull: Normal. Negative for fracture or focal lesion. Other: None CT MAXILLOFACIAL FINDINGS Osseous: Mastoid air cells are clear. No mandibular fracture. Pterygoid plates and zygomatic arches are intact. No acute nasal bone fracture Orbits: Negative. No traumatic or inflammatory finding. Sinuses: Clear. Soft tissues: Right chin soft tissue laceration or skin wound. Stable 11 mm left parotid node or nodule. CT CERVICAL SPINE FINDINGS Alignment: Straightening of the cervical spine. No subluxation. Facet alignment is within normal limits Skull base and vertebrae: No acute fracture. No primary bone lesion or focal pathologic process. Soft tissues and spinal canal: No prevertebral fluid or swelling. No visible canal hematoma. Disc levels: Multilevel degenerative change. Mild disc space narrowing C5-C6 and C6-C7. Mild multilevel facet degenerative changes. Upper chest:  Negative. Other: None IMPRESSION: 1. No CT evidence for acute intracranial abnormality. Atrophy and chronic small vessel ischemic changes of the white matter. 2. Straightening of the cervical spine with degenerative changes. No acute osseous abnormality. 3. Right chin soft tissue laceration or skin wound. No acute facial bone fracture Electronically Signed   By: Luke Bun M.D.   On: 07/01/2023 22:11   CT Maxillofacial Wo Contrast Result Date: 07/01/2023 CLINICAL DATA:  Fall EXAM: CT HEAD WITHOUT CONTRAST CT MAXILLOFACIAL WITHOUT CONTRAST CT CERVICAL SPINE WITHOUT CONTRAST TECHNIQUE: Multidetector CT imaging of the head, cervical spine, and maxillofacial structures were performed using the standard protocol without intravenous contrast.  Multiplanar CT image reconstructions of the cervical spine and maxillofacial structures were also generated. RADIATION DOSE REDUCTION: This exam was performed according to the departmental dose-optimization program which includes automated exposure control, adjustment of the mA and/or kV according to patient size and/or use of iterative reconstruction technique. COMPARISON:  CT brain cervical and facial 05/13/2022 FINDINGS: CT HEAD FINDINGS Brain: No acute territorial infarction, hemorrhage or intracranial mass. Atrophy and chronic small vessel ischemic changes of the white matter. Small chronic right thalamic infarcts. Stable ventricle size. Vascular: No hyperdense vessels.  Carotid vascular calcification Skull: Normal. Negative for fracture or focal lesion. Other: None CT MAXILLOFACIAL FINDINGS Osseous: Mastoid air cells are clear. No mandibular fracture. Pterygoid plates and zygomatic arches are intact. No acute nasal bone fracture Orbits: Negative. No traumatic or inflammatory finding. Sinuses: Clear. Soft tissues: Right chin soft tissue laceration or skin wound. Stable 11 mm left parotid node or nodule. CT CERVICAL SPINE FINDINGS Alignment: Straightening of the cervical  spine. No subluxation. Facet alignment is within normal limits Skull base and vertebrae: No acute fracture. No primary bone lesion or focal pathologic process. Soft tissues and spinal canal: No prevertebral fluid or swelling. No visible canal hematoma. Disc levels: Multilevel degenerative change. Mild disc space narrowing C5-C6 and C6-C7. Mild multilevel facet degenerative changes. Upper chest: Negative. Other: None IMPRESSION: 1. No CT evidence for acute intracranial abnormality. Atrophy and chronic small vessel ischemic changes of the white matter. 2. Straightening of the cervical spine with degenerative changes. No acute osseous abnormality. 3. Right chin soft tissue laceration or skin wound. No acute facial bone fracture Electronically Signed   By: Luke Bun M.D.   On: 07/01/2023 22:11   CT Cervical Spine Wo Contrast Result Date: 07/01/2023 CLINICAL DATA:  Fall EXAM: CT HEAD WITHOUT CONTRAST CT MAXILLOFACIAL WITHOUT CONTRAST CT CERVICAL SPINE WITHOUT CONTRAST TECHNIQUE: Multidetector CT imaging of the head, cervical spine, and maxillofacial structures were performed using the standard protocol without intravenous contrast. Multiplanar CT image reconstructions of the cervical spine and maxillofacial structures were also generated. RADIATION DOSE REDUCTION: This exam was performed according to the departmental dose-optimization program which includes automated exposure control, adjustment of the mA and/or kV according to patient size and/or use of iterative reconstruction technique. COMPARISON:  CT brain cervical and facial 05/13/2022 FINDINGS: CT HEAD FINDINGS Brain: No acute territorial infarction, hemorrhage or intracranial mass. Atrophy and chronic small vessel ischemic changes of the white matter. Small chronic right thalamic infarcts. Stable ventricle size. Vascular: No hyperdense vessels.  Carotid vascular calcification Skull: Normal. Negative for fracture or focal lesion. Other: None CT MAXILLOFACIAL  FINDINGS Osseous: Mastoid air cells are clear. No mandibular fracture. Pterygoid plates and zygomatic arches are intact. No acute nasal bone fracture Orbits: Negative. No traumatic or inflammatory finding. Sinuses: Clear. Soft tissues: Right chin soft tissue laceration or skin wound. Stable 11 mm left parotid node or nodule. CT CERVICAL SPINE FINDINGS Alignment: Straightening of the cervical spine. No subluxation. Facet alignment is within normal limits Skull base and vertebrae: No acute fracture. No primary bone lesion or focal pathologic process. Soft tissues and spinal canal: No prevertebral fluid or swelling. No visible canal hematoma. Disc levels: Multilevel degenerative change. Mild disc space narrowing C5-C6 and C6-C7. Mild multilevel facet degenerative changes. Upper chest: Negative. Other: None IMPRESSION: 1. No CT evidence for acute intracranial abnormality. Atrophy and chronic small vessel ischemic changes of the white matter. 2. Straightening of the cervical spine with degenerative changes. No acute osseous abnormality. 3. Right chin  soft tissue laceration or skin wound. No acute facial bone fracture Electronically Signed   By: Luke Bun M.D.   On: 07/01/2023 22:11   DG Pelvis 1-2 Views Result Date: 07/01/2023 CLINICAL DATA:  Recent fall with pelvic pain, initial encounter EXAM: PELVIS - 1 VIEW COMPARISON:  None Available. FINDINGS: Pelvic ring is intact. Postsurgical changes are noted in the proximal right femur. Acute fracture or dislocation is seen. No soft tissue abnormality is noted. IMPRESSION: No acute abnormality noted. Electronically Signed   By: Oneil Devonshire M.D.   On: 07/01/2023 21:41   DG Knee Complete 4 Views Right Result Date: 07/01/2023 CLINICAL DATA:  Recent fall with right knee pain, initial encounter EXAM: RIGHT KNEE - COMPLETE 4+ VIEW COMPARISON:  None FINDINGS: Medullary rod is noted in the distal right femur. Transverse fracture through the midportion of the patella is noted  with approximately 8 mm displacement of the fracture fragments. No other fracture or dislocation is seen. No soft tissue changes are noted. IMPRESSION: Mid patellar fracture. Electronically Signed   By: Oneil Devonshire M.D.   On: 07/01/2023 21:40   DG Chest Portable 1 View Result Date: 07/01/2023 CLINICAL DATA:  Recent fall with chest pain, initial encounter EXAM: PORTABLE CHEST 1 VIEW COMPARISON:  05/13/2023 FINDINGS: Cardiac shadow is enlarged. Pacing device is again seen. Findings of prior TAVR are noted. Aortic calcifications are seen. The lungs are clear bilaterally. No bony abnormality is noted. IMPRESSION: No acute abnormality noted. Electronically Signed   By: Oneil Devonshire M.D.   On: 07/01/2023 21:39           LOS: 1 day   Time spent= 35 mins    Burgess JAYSON Dare, MD [facility]  If 7PM-7AM, please contact night-coverage  07/03/2023, 10:36 AM

## 2023-07-04 DIAGNOSIS — S82001A Unspecified fracture of right patella, initial encounter for closed fracture: Secondary | ICD-10-CM | POA: Diagnosis not present

## 2023-07-04 LAB — GLUCOSE, CAPILLARY
Glucose-Capillary: 124 mg/dL — ABNORMAL HIGH (ref 70–99)
Glucose-Capillary: 137 mg/dL — ABNORMAL HIGH (ref 70–99)
Glucose-Capillary: 244 mg/dL — ABNORMAL HIGH (ref 70–99)
Glucose-Capillary: 138 mg/dL — ABNORMAL HIGH (ref 70–99)
Glucose-Capillary: 233 mg/dL — ABNORMAL HIGH (ref 70–99)

## 2023-07-04 LAB — BASIC METABOLIC PANEL
Anion gap: 8 (ref 5–15)
BUN: 70 mg/dL — ABNORMAL HIGH (ref 8–23)
CO2: 21 mmol/L — ABNORMAL LOW (ref 22–32)
Calcium: 8.6 mg/dL — ABNORMAL LOW (ref 8.9–10.3)
Chloride: 105 mmol/L (ref 98–111)
Creatinine, Ser: 4.07 mg/dL — ABNORMAL HIGH (ref 0.44–1.00)
GFR, Estimated: 10 mL/min — ABNORMAL LOW (ref >60)
Glucose, Bld: 133 mg/dL — ABNORMAL HIGH (ref 70–99)
Potassium: 4.6 mmol/L (ref 3.5–5.1)
Sodium: 134 mmol/L — ABNORMAL LOW (ref 135–145)

## 2023-07-04 LAB — CBC
HCT: 28.4 % — ABNORMAL LOW (ref 36.0–46.0)
Hemoglobin: 8.8 g/dL — ABNORMAL LOW (ref 12.0–15.0)
MCH: 28.8 pg (ref 26.0–34.0)
MCHC: 31 g/dL (ref 30.0–36.0)
MCV: 92.8 fL (ref 80.0–100.0)
Platelets: 137 10*3/uL — ABNORMAL LOW (ref 150–400)
RBC: 3.06 MIL/uL — ABNORMAL LOW (ref 3.87–5.11)
RDW: 15.3 % (ref 11.5–15.5)
WBC: 10.7 10*3/uL — ABNORMAL HIGH (ref 4.0–10.5)
nRBC: 0 % (ref 0.0–0.2)

## 2023-07-04 LAB — URINE CULTURE: Culture: >=100000 — AB

## 2023-07-04 LAB — HEMOGLOBIN A1C
Hgb A1c MFr Bld: 7.5 % — ABNORMAL HIGH (ref 4.8–5.6)
Mean Plasma Glucose: 169 mg/dL

## 2023-07-04 LAB — MAGNESIUM: Magnesium: 2.1 mg/dL (ref 1.7–2.4)

## 2023-07-04 MED ORDER — SODIUM CHLORIDE 0.9 % IV SOLN: INTRAVENOUS | Status: AC

## 2023-07-04 MED ORDER — CEFAZOLIN SODIUM-DEXTROSE 1-4 GM/50ML-% IV SOLN
1.0000 g | INTRAVENOUS | Status: DC
Start: 2023-07-04 — End: 2023-07-06
  Administered 2023-07-04 – 2023-07-05 (×2): 1 g via INTRAVENOUS
  Filled 2023-07-04 (×2): qty 50

## 2023-07-04 MED ORDER — CARMEX CLASSIC LIP BALM EX OINT
TOPICAL_OINTMENT | CUTANEOUS | Status: DC | PRN
  Filled 2023-07-04: qty 10

## 2023-07-04 NOTE — Plan of Care (Signed)
  Problem: Coping: Goal: Ability to adjust to condition or change in health will improve Outcome: Progressing   Problem: Pain Management: Goal: General experience of comfort will improve Outcome: Progressing   Problem: Safety: Goal: Ability to remain free from injury will improve Outcome: Progressing

## 2023-07-04 NOTE — Progress Notes (Signed)
 PROGRESS NOTE    Molly Cobb  FMW:[id number] DOB: 07/02/1931 DOA: 07/01/2023 PCP: Fredricka Richerd Ee, MD    Brief Narrative:  89 year old with history of CKD stage IV, CAD, depression, DM2, gout, HTN, hypothyroidism, severe AAS status post TAVR, history of pacemaker, chronic anemia presented to the ED from [facility] for evaluation of right knee and facial pain after a fall.  Per family patient has been more confused over the past few days.  Chest x-ray, x-ray pelvis, CT head, cervical spine, CT maxillofacial did not show any fracture or acute trauma.  There was small laceration of the skin noted over the right chin.  X-ray of the right knee showed mild patellar fracture.  Dr. Vernetta from orthopedic was consulted and patient was placed on knee immobilizer.   Assessment & Plan:  Principal Problem:   Patella fracture Active Problems:   Hypothyroidism (acquired)   Type 2 diabetes mellitus with stage 4 chronic kidney disease, without long-term current use of insulin  (HCC)   CKD (chronic kidney disease) stage 4, GFR 15-29 ml/min (HCC)   UTI (urinary tract infection)   Acute metabolic encephalopathy   AKI (acute kidney injury) (HCC)   Metabolic acidosis   Hyperkalemia    Mild right patellar fracture Mechanical fall Secondary to a mechanical fall.  Orthopedics, Dr. Vernetta, consulted. Recommend Knee immobilizer  PT/OT-SNF. B12, vitamin D  normal.   Urinary tract infection Cultures growing E. coli.  Empiric IV Rocephin  > Ancef .  EOT 1/7   Acute metabolic encephalopathy Possibly related to UTI.  Per ED report, family was concerned that patient has been more confused over the past few days.  At present, patient is AAOx4 and answering questions appropriately.  CT head showing no acute intracranial abnormality and no obvious focal neurodeficit on exam.   CKD stage V Mild metabolic acidosis, mild hyperkalemia Uremia Hyperkalemia resolved, metabolic acidosis stabilizing.  Baseline  creatinine around 3.8-4.5.  BUN is also elevated.  Will give gentle hydration in the meantime encourage p.o. intake. -Continue outpatient sodium bicarb -Follows with nephrology at [facility]   Right hand pain Supportive care, x-rays negative   Abnormal EKG History of CAD Severe AS s/p TAVR EKG showing new T wave inversions inferolaterally.  Troponins overall remains flat   Depression Continue Lexapro .   Type 2 diabetes On sliding scale and Accu-Cheks   Hypertension Continue Coreg  and Imdur .  IV as needed   Hyperlipidemia Continue Lipitor and Zetia .   Hypothyroidism Elevated TSH, normal free T4.  Increased Synthroid  to 100 mcg. Repeat TFTs outpatient in 4 weeks   Chronic anemia Hemoglobin stable, continue to monitor labs.  PT/OT-SNF TOC consulted  DVT prophylaxis: SCDs Start: 07/02/23 0430    Code Status: Full Code Family Communication:  Daughter updated.  Pain control and SNF    Subjective: Unwitnessed fall yesterday evening while returning to the bathroom.  Evaluated by overnight provider.  No acute concerns.  Examination:  General exam: Appears calm and comfortable ; elderly frail  Respiratory system: Clear to auscultation. Respiratory effort normal. Cardiovascular system: S1 & S2 heard, RRR. No JVD, murmurs, rubs, gallops or clicks. No pedal edema. Gastrointestinal system: Abdomen is nondistended, soft and nontender. No organomegaly or masses felt. Normal bowel sounds heard. Central nervous system: Alert and oriented. No focal neurological deficits. Extremities: RLE knee immobilizer in place.  Skin: No rashes, lesions or ulcers Psychiatry: Judgement and insight appear normal. Mood & affect appropriate.  Diet Orders (From admission, onward)     Start     Ordered   07/03/23 2314  Diet Carb Modified Fluid consistency: Thin; Room service appropriate? Yes  Diet effective now       Comments: SOFT DIET PLEASE (patient has mouth  injury and is having trouble chewing)  Question Answer Comment  Diet-HS Snack? Nothing   Calorie Level Medium 1600-2000   Fluid consistency: Thin   Room service appropriate? Yes      07/03/23 2333            Objective: Vitals:   07/03/23 1906 07/03/23 2119 07/03/23 2300 07/04/23 0407  BP: 139/61 139/67  (!) 152/70  Pulse: 74 71  70  Resp: 17 18  18   Temp: 98.7 F (37.1 C) 98.6 F (37 C)  98.8 F (37.1 C)  TempSrc:  Oral  Oral  SpO2: 100% 100%  100%  Weight:   77.1 kg   Height:   5' 3 (1.6 m)     Intake/Output Summary (Last 24 hours) at 07/04/2023 1126 Last data filed at 07/04/2023 0900 Gross per 24 hour  Intake 470.06 ml  Output 1200 ml  Net -729.94 ml   Filed Weights   07/03/23 2300  Weight: 77.1 kg    Scheduled Meds:  atorvastatin   40 mg Oral QPM   carvedilol   12.5 mg Oral BID WC   docusate sodium   100 mg Oral BID   escitalopram   10 mg Oral q morning   ezetimibe   10 mg Oral QPM   feeding supplement  1 Container Oral TID BM   insulin  aspart  0-9 Units Subcutaneous Q4H   isosorbide  mononitrate  30 mg Oral q morning   levothyroxine   100 mcg Oral Q0600   sodium bicarbonate   650 mg Oral Daily   And   sodium bicarbonate   1,300 mg Oral QHS   Continuous Infusions:  sodium chloride  50 mL/hr at 07/04/23 0851    Nutritional status     Body mass index is 30.11 kg/m.  Data Reviewed:   CBC: Recent Labs  Lab 07/01/23 2125 07/02/23 0442 07/03/23 0415 07/04/23 0351  WBC 9.6 13.3* 10.7* 10.7*  NEUTROABS 7.6  --   --   --   HGB 10.8* 10.2* 9.8* 8.8*  HCT 33.9* 31.8* 31.1* 28.4*  MCV 91.1 90.3 91.5 92.8  PLT 153 166 147* 137*   Basic Metabolic Panel: Recent Labs  Lab 07/01/23 2125 07/02/23 0432 07/03/23 0415 07/04/23 0351  NA 135 136 136 134*  K 5.2* 4.9 4.9 4.6  CL 104 107 107 105  CO2 20* 20* 19* 21*  GLUCOSE 184* 144* 146* 133*  BUN 71* 70* 63* 70*  CREATININE 4.28* 4.07* 3.76* 4.07*  CALCIUM  9.0 8.8* 8.8* 8.6*  MG  --   --  2.1 2.1   PHOS  --   --  4.0  --    GFR: Estimated Creatinine Clearance: 9.2 mL/min (A) (by C-G formula based on SCr of 4.07 mg/dL (H)). Liver Function Tests: No results for input(s): AST, ALT, ALKPHOS, BILITOT, PROT, ALBUMIN  in the last 168 hours. No results for input(s): LIPASE, AMYLASE in the last 168 hours. Recent Labs  Lab 07/02/23 1402  AMMONIA 17   Coagulation Profile: No results for input(s): INR, PROTIME in the last 168 hours. Cardiac Enzymes: No results for input(s): CKTOTAL, CKMB, CKMBINDEX, TROPONINI in the last 168 hours. BNP (last 3 results) No results for input(s): PROBNP in the last 8760 hours. HbA1C: Recent Labs  07/02/23 0918  HGBA1C 7.5*   CBG: Recent Labs  Lab 07/03/23 1512 07/03/23 1955 07/03/23 2331 07/04/23 0350 07/04/23 0746  GLUCAP 191* 93 230* 124* 137*   Lipid Profile: No results for input(s): CHOL, HDL, LDLCALC, TRIG, CHOLHDL, LDLDIRECT in the last 72 hours. Thyroid  Function Tests: Recent Labs    07/02/23 0507 07/02/23 0916  TSH 38.309*  --   FREET4  --  0.89   Anemia Panel: Recent Labs    07/02/23 0916  VITAMINB12 1,063*   Sepsis Labs: No results for input(s): PROCALCITON, LATICACIDVEN in the last 168 hours.  Recent Results (from the past 240 hours)  Urine Culture     Status: Abnormal   Collection Time: 07/01/23  9:42 PM   Specimen: Urine, Clean Catch  Result Value Ref Range Status   Specimen Description   Final    URINE, CLEAN CATCH Performed at [facility], [address] [place], [place] [address]    Special Requests   Final    NONE Performed at [facility], [address] [place], [place] [address]    Culture >=100,000 COLONIES/mL ESCHERICHIA COLI (A)  Final   Report Status 07/04/2023 FINAL  Final   Organism ID, Bacteria ESCHERICHIA COLI (A)  Final      Susceptibility   Escherichia coli - MIC*    AMPICILLIN >=32 RESISTANT Resistant      CEFAZOLIN  16 SENSITIVE Sensitive     CEFEPIME <=0.12 SENSITIVE Sensitive     CEFTRIAXONE  1 SENSITIVE Sensitive     CIPROFLOXACIN <=0.25 SENSITIVE Sensitive     GENTAMICIN <=1 SENSITIVE Sensitive     IMIPENEM <=0.25 SENSITIVE Sensitive     NITROFURANTOIN <=16 SENSITIVE Sensitive     TRIMETH/SULFA <=20 SENSITIVE Sensitive     AMPICILLIN/SULBACTAM >=32 RESISTANT Resistant     PIP/TAZO 8 SENSITIVE Sensitive ug/mL    * >=100,000 COLONIES/mL ESCHERICHIA COLI         Radiology Studies: No results found.         LOS: 2 days   Time spent= 35 mins    Burgess JAYSON Dare, MD [facility]  If 7PM-7AM, please contact night-coverage  07/04/2023, 11:26 AM

## 2023-07-05 DIAGNOSIS — S82001A Unspecified fracture of right patella, initial encounter for closed fracture: Secondary | ICD-10-CM | POA: Diagnosis not present

## 2023-07-05 LAB — BASIC METABOLIC PANEL
Anion gap: 9 (ref 5–15)
BUN: 72 mg/dL — ABNORMAL HIGH (ref 8–23)
CO2: 19 mmol/L — ABNORMAL LOW (ref 22–32)
Calcium: 8.3 mg/dL — ABNORMAL LOW (ref 8.9–10.3)
Chloride: 106 mmol/L (ref 98–111)
Creatinine, Ser: 4.05 mg/dL — ABNORMAL HIGH (ref 0.44–1.00)
GFR, Estimated: 10 mL/min — ABNORMAL LOW (ref >60)
Glucose, Bld: 72 mg/dL (ref 70–99)
Potassium: 4 mmol/L (ref 3.5–5.1)
Sodium: 134 mmol/L — ABNORMAL LOW (ref 135–145)

## 2023-07-05 LAB — CBC
HCT: 26.8 % — ABNORMAL LOW (ref 36.0–46.0)
Hemoglobin: 8.6 g/dL — ABNORMAL LOW (ref 12.0–15.0)
MCH: 29.3 pg (ref 26.0–34.0)
MCHC: 32.1 g/dL (ref 30.0–36.0)
MCV: 91.2 fL (ref 80.0–100.0)
Platelets: 141 10*3/uL — ABNORMAL LOW (ref 150–400)
RBC: 2.94 MIL/uL — ABNORMAL LOW (ref 3.87–5.11)
RDW: 15.4 % (ref 11.5–15.5)
WBC: 10 10*3/uL (ref 4.0–10.5)
nRBC: 0 % (ref 0.0–0.2)

## 2023-07-05 LAB — GLUCOSE, CAPILLARY
Glucose-Capillary: 113 mg/dL — ABNORMAL HIGH (ref 70–99)
Glucose-Capillary: 120 mg/dL — ABNORMAL HIGH (ref 70–99)
Glucose-Capillary: 173 mg/dL — ABNORMAL HIGH (ref 70–99)
Glucose-Capillary: 191 mg/dL — ABNORMAL HIGH (ref 70–99)
Glucose-Capillary: 204 mg/dL — ABNORMAL HIGH (ref 70–99)
Glucose-Capillary: 235 mg/dL — ABNORMAL HIGH (ref 70–99)
Glucose-Capillary: 64 mg/dL — ABNORMAL LOW (ref 70–99)

## 2023-07-05 LAB — MAGNESIUM: Magnesium: 2.1 mg/dL (ref 1.7–2.4)

## 2023-07-05 MED ORDER — OXYCODONE HCL 5 MG PO TABS: 5.0000 mg | ORAL_TABLET | Freq: Four times a day (QID) | ORAL | 0 refills | Status: DC | PRN

## 2023-07-05 MED ORDER — LEVOTHYROXINE SODIUM 100 MCG PO TABS: 100.0000 ug | ORAL_TABLET | Freq: Every day | ORAL | Status: AC

## 2023-07-05 MED ORDER — ASPIRIN 81 MG PO CHEW
81.0000 mg | CHEWABLE_TABLET | Freq: Every day | ORAL | Status: DC
  Administered 2023-07-05 – 2023-07-13 (×9): 81 mg via ORAL
  Filled 2023-07-05 (×9): qty 1

## 2023-07-05 NOTE — Progress Notes (Signed)
 Nurse tech notified this RN that pt seemed different. Pt drowsy but responds to voice when name called. Unable to hold eye contact. Pt unlabored & symmetrical breathing. Dilaudid  given this morning for pain at 0829. Dr. Caleen notified. 10am medication hold at this time due to drowsiness. Pt stable at this time

## 2023-07-05 NOTE — Progress Notes (Signed)
 Physical Therapy Treatment Patient Details Name: Molly Cobb MRN: [id number] DOB: [birth year] Today's Date: 07/05/2023   History of Present Illness Patient is a 89 year old female who presented on 1/3 with pain in R knee and face after fall. Patient was found to have mild patellar fracture, UTI, and acute metabolic encephalopathy. PMH: CKD, CAD, depression, type II DM, gout, hypertension, hypothyroidism, severe aortic stenosis with history of TAVR, history of pacemaker placement, hypothyroidism.    PT Comments   Pt admitted with above diagnosis.  Pt currently with functional limitations due to the deficits listed below (see PT Problem List). Pt in bed when PT arrived. Pt c/o R ankle pain, PT adjusted R bleedsoe brace anteriorly and no further c/o R ankle pain, pt reported R knee pain and R side rib and breast pain. PT noted bruises R posterior trunk distal to scapula. Pt is unable to rate pain on a numeric scale. Pt required increased time cues and mod A for supine to sit with use of hospital bed, pt required mod A for pull to stand from elevated EOB, mod A for safety and stability once standing at RW and facilitation for weight shifting to complete SPT bed to recliner. Pt left in recliner and all needs in place.  Pt will benefit from acute skilled PT to increase their independence and safety with mobility to allow discharge.      If plan is discharge home, recommend the following: A lot of help with walking and/or transfers;A lot of help with bathing/dressing/bathroom;Assistance with cooking/housework;Assist for transportation;Help with stairs or ramp for entrance   Can travel by private vehicle     No  Equipment Recommendations  None recommended by PT    Recommendations for Other Services       Precautions / Restrictions Precautions Precautions: Fall Restrictions Weight Bearing Restrictions Per Provider Order: No RLE Weight Bearing Per Provider Order: Weight bearing as tolerated Other  Position/Activity Restrictions: with bledsoe brace     Mobility  Bed Mobility Overal bed mobility: Needs Assistance Bed Mobility: Supine to Sit     Supine to sit: Mod assist, HOB elevated, Used rails     General bed mobility comments: increased times with cues for sequence and assist to manage LE and trunk    Transfers Overall transfer level: Needs assistance Equipment used: Rolling walker (2 wheels) Transfers: Sit to/from Stand, Bed to chair/wheelchair/BSC Sit to Stand: Mod assist, From elevated surface   Step pivot transfers: Mod assist       General transfer comment: cues, increased time, pull to stand from EOB to RW and facilitation for weight shifing for SPT bed to recliner    Ambulation/Gait               General Gait Details: NT today due to reports of R rib/breast pain and difficulty WB through R UE with SPT bed to recliner   Stairs             Wheelchair Mobility     Tilt Bed    Modified Rankin (Stroke Patients Only)       Balance Overall balance assessment: Needs assistance Sitting-balance support: No upper extremity supported, Feet supported Sitting balance-Leahy Scale: Good     Standing balance support: Bilateral upper extremity supported, Reliant on assistive device for balance, During functional activity Standing balance-Leahy Scale: Poor  Cognition Arousal: Alert Behavior During Therapy: WFL for tasks assessed/performed Overall Cognitive Status: Difficult to assess                                 General Comments: pt asked several times during therapy session why her R LE hurt        Exercises      General Comments        Pertinent Vitals/Pain Pain Assessment Pain Assessment: Faces Faces Pain Scale: Hurts whole lot Breathing: normal Negative Vocalization: none Facial Expression: smiling or inexpressive Body Language: relaxed Consolability: no need to  console PAINAD Score: 0 Pain Location: R knee, ankle and R ribs/breast Pain Descriptors / Indicators: Grimacing, Sore    Home Living                          Prior Function            PT Goals (current goals can now be found in the care plan section) Acute Rehab PT Goals Patient Stated Goal: Regain IND PT Goal Formulation: With patient Time For Goal Achievement: 07/17/23 Potential to Achieve Goals: Fair Progress towards PT goals: Progressing toward goals    Frequency    Min 1X/week      PT Plan      Co-evaluation              AM-PAC PT 6 Clicks Mobility   Outcome Measure  Help needed turning from your back to your side while in a flat bed without using bedrails?: A Lot Help needed moving from lying on your back to sitting on the side of a flat bed without using bedrails?: A Lot Help needed moving to and from a bed to a chair (including a wheelchair)?: A Lot Help needed standing up from a chair using your arms (e.g., wheelchair or bedside chair)?: A Lot Help needed to walk in hospital room?: Total Help needed climbing 3-5 steps with a railing? : Total 6 Click Score: 10    End of Session Equipment Utilized During Treatment: Gait belt Activity Tolerance: Patient limited by pain;Patient limited by fatigue Patient left: with call bell/phone within reach;in chair Nurse Communication: Mobility status (pt pain report) PT Visit Diagnosis: Difficulty in walking, not elsewhere classified (R26.2)     Time: 8466-8447 PT Time Calculation (min) (ACUTE ONLY): 19 min  Charges:    $Therapeutic Activity: 8-22 mins PT General Charges $$ ACUTE PT VISIT: 1 Visit                     Molly Cobb, PT Acute Rehab    Molly Cobb Molly Cobb 07/05/2023, 4:05 PM

## 2023-07-05 NOTE — Inpatient Diabetes Management (Signed)
 Inpatient Diabetes Program Recommendations  AACE/ADA: New Consensus Statement on Inpatient Glycemic Control (2015)  Target Ranges:  Prepandial:   less than 140 mg/dL      Peak postprandial:   less than 180 mg/dL (1-2 hours)      Critically ill patients:  140 - 180 mg/dL   Lab Results  Component Value Date   GLUCAP 113 (H) 07/05/2023   HGBA1C 7.5 (H) 07/02/2023    Review of Glycemic Control  Latest Reference Range & Units 07/05/23 00:41 07/05/23 04:50 07/05/23 08:17  Glucose-Capillary 70 - 99 mg/dL 826 (H) 64 (L) 886 (H)  (H): Data is abnormally high (L): Data is abnormally low  Diabetes history: DM2, CKD Outpatient Diabetes medications: Glipizide  5 mg with BF, Tradjenta  5 mg QD Current orders for Inpatient glycemic control: Novolog  0-9 units Q4H  Inpatient Diabetes Program Recommendations:    Glucose was 64 mg/dL this morning.  Might consider:  Novolog  0-9 units TID as she has a diet ordered.    Will continue to follow while inpatient.  Thank you, Wyvonna Pinal, MSN, CDCES Diabetes Coordinator Inpatient Diabetes Program [phone] (team pager from 8a-5p)

## 2023-07-05 NOTE — TOC Initial Note (Signed)
 Transition of Care [facility]) - Initial/Assessment Note    Patient Details  Name: Molly Cobb MRN: [id number] Date of Birth: 1931/07/20  Transition of Care [facility]) CM/SW Contact:    NORMAN ASPEN, LCSW Phone Number: 07/05/2023, 2:27 PM  Clinical Narrative:                  Attempted to meet with pt today to review dc planning needs, however she is very lethargic and cannot really engage.  Contacted pt's daughter, Molly, who confirms that pt was admitted from her [place] apt at [facility].  She and I discussed recommendation from therapies to pursue SNF for rehab prior to her return to [place] and daughter in agreement with plan.  She requests SNF in [facility] area if possible.  Will begin SNF bed search process.   Barriers to Discharge: English As A Second Language Teacher, SNF Pending bed offer   Patient Goals and CMS Choice Patient states their goals for this hospitalization and ongoing recovery are:: return to ILF following SNF rehab          Expected Discharge Plan and Services In-house Referral: Clinical Social Work     Living arrangements for the past 2 months: Independent Living Facility                                      Prior Living Arrangements/Services Living arrangements for the past 2 months: Independent Living Facility Lives with:: Self Patient language and need for interpreter reviewed:: Yes Do you feel safe going back to the place where you live?: Yes      Need for Family Participation in Patient Care: No (Comment) Care giver support system in place?: Yes (comment)   Criminal Activity/Legal Involvement Pertinent to Current Situation/Hospitalization: No - Comment as needed  Activities of Daily Living   ADL Screening (condition at time of admission) Independently performs ADLs?: Yes (appropriate for developmental age) Is the patient deaf or have difficulty hearing?: No Does the patient have difficulty seeing, even when wearing glasses/contacts?: No Does the patient  have difficulty concentrating, remembering, or making decisions?: No  Permission Sought/Granted Permission sought to share information with : Family Supports Permission granted to share information with : Yes, Verbal Permission Granted  Share Information with NAME: daughter, Molly Cobb @ [phone]           Emotional Assessment Appearance:: Appears stated age Attitude/Demeanor/Rapport: Lethargic   Orientation: : Oriented to Self, Oriented to Place Alcohol / Substance Use: Not Applicable Psych Involvement: No (comment)  Admission diagnosis:  Patella fracture [S82.009A] Acute cystitis without hematuria [N30.00] Fall, initial encounter [W19.XXXA] Closed nondisplaced fracture of right patella, unspecified fracture morphology, initial encounter [S82.001A] Patient Active Problem List   Diagnosis Date Noted   Patella fracture 07/02/2023   UTI (urinary tract infection) 07/02/2023   Acute metabolic encephalopathy 07/02/2023   AKI (acute kidney injury) (HCC) 07/02/2023   Metabolic acidosis 07/02/2023   Hyperkalemia 07/02/2023   Adult failure to thrive 09/28/2021   Frequency of micturition 09/22/2021   Hyponatremia 09/21/2021   Severe aortic stenosis 04/12/2017   S/P TAVR (transcatheter aortic valve replacement) 04/12/2017   CKD (chronic kidney disease) stage 4, GFR 15-29 ml/min (HCC) 03/23/2017   AVM (arteriovenous malformation) of stomach, acquired with hemorrhage    S/P placement of cardiac pacemaker 02/16/2017   Anemia in chronic renal disease 02/13/2017   Hypothyroidism (acquired) 06/29/2016   Type 2 diabetes mellitus with stage  4 chronic kidney disease, without long-term current use of insulin  (HCC) 09/09/2015   Benign hypertension with CKD (chronic kidney disease) stage IV (HCC) 08/02/2015   PCP:  Fredricka Richerd Ee, MD Pharmacy:   [facility] [address] [place], [place] - [address] [place] [address] Phone: [phone]  Fax: [phone]  [facility] Delivery - [place], [place] - [address] [place] [address] Phone: [phone] Fax: [phone]     Social Drivers of Health (SDOH) Social History: SDOH Screenings   Food Insecurity: No Food Insecurity (07/02/2023)  Housing: Low Risk  (07/02/2023)  Transportation Needs: No Transportation Needs (07/02/2023)  Utilities: Not At Risk (07/02/2023)  Financial Resource Strain: Low Risk  (01/11/2022)   Received from [facility] visits prior to 08/28/2022., [facility] visits prior to 08/28/2022.  Physical Activity: Unknown (01/11/2022)   [facility] from [facility], [facility] visits prior to 08/28/2022., [facility] visits prior to 08/28/2022., [facility], [facility] visits prior to 08/28/2022.  Social Connections: Unknown (07/02/2023)  Stress: Stress Concern Present (01/11/2022)   [facility] prior to 08/28/2022., [facility] visits prior to 08/28/2022.  Tobacco Use: Low Risk  (07/02/2023)   SDOH Interventions:     Readmission Risk Interventions    07/05/2023    2:24 PM 09/24/2021   11:03 AM  Readmission Risk Prevention Plan  Transportation Screening Complete Complete  PCP or Specialist Appt within 3-5 Days Complete Complete  HRI or Home Care Consult Complete Complete  Social Work Consult for Recovery Care Planning/Counseling Complete Complete  Palliative Care Screening Not Applicable Not Applicable  Medication Review Oceanographer) Complete Complete

## 2023-07-05 NOTE — Progress Notes (Signed)
 PROGRESS NOTE    Molly Cobb  FMW:[id number] DOB: 11/18/1931 DOA: 07/01/2023 PCP: Fredricka Richerd Ee, MD    Brief Narrative:  89 year old with history of CKD stage IV, CAD, depression, DM2, gout, HTN, hypothyroidism, severe AAS status post TAVR, history of pacemaker, chronic anemia presented to the ED from [facility] [place] for evaluation of right knee and facial pain after a fall.  Per family patient has been more confused over the past few days.  Chest x-ray, x-ray pelvis, CT head, cervical spine, CT maxillofacial did not show any fracture or acute trauma.  There was small laceration of the skin noted over the right chin.  X-ray of the right knee showed mild patellar fracture.  Dr. Vernetta from orthopedic was consulted and patient was placed on knee immobilizer.   Assessment & Plan:  Principal Problem:   Patella fracture Active Problems:   Hypothyroidism (acquired)   Type 2 diabetes mellitus with stage 4 chronic kidney disease, without long-term current use of insulin  (HCC)   CKD (chronic kidney disease) stage 4, GFR 15-29 ml/min (HCC)   UTI (urinary tract infection)   Acute metabolic encephalopathy   AKI (acute kidney injury) (HCC)   Metabolic acidosis   Hyperkalemia    Mild right patellar fracture Mechanical fall Secondary to a mechanical fall.  Orthopedics, Dr. Vernetta, consulted. Recommend Knee immobilizer  PT/OT-SNF. B12, vitamin D  normal.   Urinary tract infection Cultures growing E. coli.  Empiric IV Rocephin  > Ancef .  EOT 1/7   Acute metabolic encephalopathy Possibly related to UTI.  Per ED report, family was concerned that patient has been more confused over the past few days.  At present, patient is AAOx4 and answering questions appropriately.  CT head showing no acute intracranial abnormality and no obvious focal neurodeficit on exam.   CKD stage V Mild metabolic acidosis, mild hyperkalemia Uremia Hyperkalemia resolved, metabolic acidosis stabilizing.  Baseline  creatinine around 3.8-4.5.  BUN is also elevated.  Gentle hydration as needed -Continue outpatient sodium bicarb -Follows with nephrology at [facility]   Right hand pain Supportive care, x-rays negative   Abnormal EKG History of CAD Severe AS s/p TAVR EKG showing new T wave inversions inferolaterally.  Troponins overall remains flat   Depression Continue Lexapro .   Type 2 diabetes On sliding scale and Accu-Cheks   Hypertension Continue Coreg  and Imdur .  IV as needed   Hyperlipidemia Continue Lipitor and Zetia .   Hypothyroidism Elevated TSH, normal free T4.  Increased Synthroid  to 100 mcg. Repeat TFTs outpatient in 4 weeks   Chronic anemia Hemoglobin stable, continue to monitor labs.  PT/OT-SNF TOC consulted  DVT prophylaxis: SCDs Start: 07/02/23 0430    Code Status: Full Code Family Communication:  Daughter updated.  Awaiting SNF placement    Subjective: Had pain this morning received IV diluadid.  Drowsy during my exam but easily arousable and answers basic questions appropriately.  Examination:  General exam: Appears calm and comfortable ; elderly frail  Respiratory system: Clear to auscultation. Respiratory effort normal. Cardiovascular system: S1 & S2 heard, RRR. No JVD, murmurs, rubs, gallops or clicks. No pedal edema. Gastrointestinal system: Abdomen is nondistended, soft and nontender. No organomegaly or masses felt. Normal bowel sounds heard. Central nervous system: Alert and oriented. No focal neurological deficits. Extremities: RLE knee immobilizer in place.  Skin: No rashes, lesions or ulcers Psychiatry: Judgement and insight appear normal. Mood & affect appropriate.                Diet Orders (From  admission, onward)     Start     Ordered   07/03/23 2314  Diet Carb Modified Fluid consistency: Thin; Room service appropriate? Yes  Diet effective now       Comments: SOFT DIET PLEASE (patient has mouth injury and is having trouble  chewing)  Question Answer Comment  Diet-HS Snack? Nothing   Calorie Level Medium 1600-2000   Fluid consistency: Thin   Room service appropriate? Yes      07/03/23 2333            Objective: Vitals:   07/04/23 0407 07/04/23 1410 07/04/23 2114 07/05/23 0457  BP: (!) 152/70 (!) 128/52 (!) 160/75 (!) 147/57  Pulse: 70 73 72 77  Resp: 18 18 17 18   Temp: 98.8 F (37.1 C) 98.4 F (36.9 C) 98.6 F (37 C) 98.2 F (36.8 C)  TempSrc: Oral Oral Oral Oral  SpO2: 100% 100% 100% 95%  Weight:      Height:        Intake/Output Summary (Last 24 hours) at 07/05/2023 1155 Last data filed at 07/05/2023 0655 Gross per 24 hour  Intake 199.97 ml  Output 500 ml  Net -300.03 ml   Filed Weights   07/03/23 2300  Weight: 77.1 kg    Scheduled Meds:  atorvastatin   40 mg Oral QPM   carvedilol   12.5 mg Oral BID WC   docusate sodium   100 mg Oral BID   escitalopram   10 mg Oral q morning   ezetimibe   10 mg Oral QPM   feeding supplement  1 Container Oral TID BM   insulin  aspart  0-9 Units Subcutaneous Q4H   isosorbide  mononitrate  30 mg Oral q morning   levothyroxine   100 mcg Oral Q0600   sodium bicarbonate   650 mg Oral Daily   And   sodium bicarbonate   1,300 mg Oral QHS   Continuous Infusions:  sodium chloride  50 mL/hr at 07/04/23 0851    ceFAZolin  (ANCEF ) IV 1 g (07/04/23 2232)    Nutritional status     Body mass index is 30.11 kg/m.  Data Reviewed:   CBC: Recent Labs  Lab 07/01/23 2125 07/02/23 0442 07/03/23 0415 07/04/23 0351 07/05/23 0342  WBC 9.6 13.3* 10.7* 10.7* 10.0  NEUTROABS 7.6  --   --   --   --   HGB 10.8* 10.2* 9.8* 8.8* 8.6*  HCT 33.9* 31.8* 31.1* 28.4* 26.8*  MCV 91.1 90.3 91.5 92.8 91.2  PLT 153 166 147* 137* 141*   Basic Metabolic Panel: Recent Labs  Lab 07/01/23 2125 07/02/23 0432 07/03/23 0415 07/04/23 0351 07/05/23 0342  NA 135 136 136 134* 134*  K 5.2* 4.9 4.9 4.6 4.0  CL 104 107 107 105 106  CO2 20* 20* 19* 21* 19*  GLUCOSE 184* 144*  146* 133* 72  BUN 71* 70* 63* 70* 72*  CREATININE 4.28* 4.07* 3.76* 4.07* 4.05*  CALCIUM  9.0 8.8* 8.8* 8.6* 8.3*  MG  --   --  2.1 2.1 2.1  PHOS  --   --  4.0  --   --    GFR: Estimated Creatinine Clearance: 9.3 mL/min (A) (by C-G formula based on SCr of 4.05 mg/dL (H)). Liver Function Tests: No results for input(s): AST, ALT, ALKPHOS, BILITOT, PROT, ALBUMIN  in the last 168 hours. No results for input(s): LIPASE, AMYLASE in the last 168 hours. Recent Labs  Lab 07/02/23 1402  AMMONIA 17   Coagulation Profile: No results for input(s): INR, PROTIME in the last 168  hours. Cardiac Enzymes: No results for input(s): CKTOTAL, CKMB, CKMBINDEX, TROPONINI in the last 168 hours. BNP (last 3 results) No results for input(s): PROBNP in the last 8760 hours. HbA1C: No results for input(s): HGBA1C in the last 72 hours. CBG: Recent Labs  Lab 07/04/23 1659 07/04/23 2037 07/05/23 0041 07/05/23 0450 07/05/23 0817  GLUCAP 138* 233* 173* 64* 113*   Lipid Profile: No results for input(s): CHOL, HDL, LDLCALC, TRIG, CHOLHDL, LDLDIRECT in the last 72 hours. Thyroid  Function Tests: No results for input(s): TSH, T4TOTAL, FREET4, T3FREE, THYROIDAB in the last 72 hours. Anemia Panel: No results for input(s): VITAMINB12, FOLATE, FERRITIN, TIBC, IRON, RETICCTPCT in the last 72 hours. Sepsis Labs: No results for input(s): PROCALCITON, LATICACIDVEN in the last 168 hours.  Recent Results (from the past 240 hours)  Urine Culture     Status: Abnormal   Collection Time: 07/01/23  9:42 PM   Specimen: Urine, Clean Catch  Result Value Ref Range Status   Specimen Description   Final    URINE, CLEAN CATCH Performed at [facility], [address] [place], [place] [address]    Special Requests   Final    NONE Performed at [facility], [address] [place], [place] [address]    Culture  >=100,000 COLONIES/mL ESCHERICHIA COLI (A)  Final   Report Status 07/04/2023 FINAL  Final   Organism ID, Bacteria ESCHERICHIA COLI (A)  Final      Susceptibility   Escherichia coli - MIC*    AMPICILLIN >=32 RESISTANT Resistant     CEFAZOLIN  16 SENSITIVE Sensitive     CEFEPIME <=0.12 SENSITIVE Sensitive     CEFTRIAXONE  1 SENSITIVE Sensitive     CIPROFLOXACIN <=0.25 SENSITIVE Sensitive     GENTAMICIN <=1 SENSITIVE Sensitive     IMIPENEM <=0.25 SENSITIVE Sensitive     NITROFURANTOIN <=16 SENSITIVE Sensitive     TRIMETH/SULFA <=20 SENSITIVE Sensitive     AMPICILLIN/SULBACTAM >=32 RESISTANT Resistant     PIP/TAZO 8 SENSITIVE Sensitive ug/mL    * >=100,000 COLONIES/mL ESCHERICHIA COLI         Radiology Studies: No results found.         LOS: 3 days   Time spent= 35 mins    Burgess JAYSON Dare, MD [facility]  If 7PM-7AM, please contact night-coverage  07/05/2023, 11:55 AM

## 2023-07-06 DIAGNOSIS — S82001A Unspecified fracture of right patella, initial encounter for closed fracture: Secondary | ICD-10-CM | POA: Diagnosis not present

## 2023-07-06 LAB — GLUCOSE, CAPILLARY
Glucose-Capillary: 118 mg/dL — ABNORMAL HIGH (ref 70–99)
Glucose-Capillary: 144 mg/dL — ABNORMAL HIGH (ref 70–99)
Glucose-Capillary: 203 mg/dL — ABNORMAL HIGH (ref 70–99)
Glucose-Capillary: 230 mg/dL — ABNORMAL HIGH (ref 70–99)
Glucose-Capillary: 200 mg/dL — ABNORMAL HIGH (ref 70–99)
Glucose-Capillary: 215 mg/dL — ABNORMAL HIGH (ref 70–99)

## 2023-07-06 LAB — BASIC METABOLIC PANEL
Anion gap: 11 (ref 5–15)
BUN: 72 mg/dL — ABNORMAL HIGH (ref 8–23)
CO2: 18 mmol/L — ABNORMAL LOW (ref 22–32)
Calcium: 8 mg/dL — ABNORMAL LOW (ref 8.9–10.3)
Chloride: 102 mmol/L (ref 98–111)
Creatinine, Ser: 3.45 mg/dL — ABNORMAL HIGH (ref 0.44–1.00)
GFR, Estimated: 12 mL/min — ABNORMAL LOW (ref >60)
Glucose, Bld: 126 mg/dL — ABNORMAL HIGH (ref 70–99)
Potassium: 4.1 mmol/L (ref 3.5–5.1)
Sodium: 131 mmol/L — ABNORMAL LOW (ref 135–145)

## 2023-07-06 LAB — CBC
HCT: 27.2 % — ABNORMAL LOW (ref 36.0–46.0)
Hemoglobin: 8.6 g/dL — ABNORMAL LOW (ref 12.0–15.0)
MCH: 28.6 pg (ref 26.0–34.0)
MCHC: 31.6 g/dL (ref 30.0–36.0)
MCV: 90.4 fL (ref 80.0–100.0)
Platelets: 141 10*3/uL — ABNORMAL LOW (ref 150–400)
RBC: 3.01 MIL/uL — ABNORMAL LOW (ref 3.87–5.11)
RDW: 15.2 % (ref 11.5–15.5)
WBC: 8.8 10*3/uL (ref 4.0–10.5)
nRBC: 0 % (ref 0.0–0.2)

## 2023-07-06 LAB — MAGNESIUM: Magnesium: 2.1 mg/dL (ref 1.7–2.4)

## 2023-07-06 NOTE — NC FL2 (Signed)
 [place]  MEDICAID FL2 LEVEL OF CARE FORM     IDENTIFICATION  Patient Name: Molly Cobb Birthdate: [birth year] Sex: female Admission Date (Current Location): 07/01/2023  [facility] and [place] Number:  Producer, Television/film/video and Address:  [facility],  [address], [place] [address]      Provider Number: [id number]  Attending Physician Name and Address:  Caleen Burgess BROCKS, MD  Relative Name and Phone Number:  daughter, [name] [phone]    Current Level of Care: Hospital Recommended Level of Care: Skilled Nursing Facility Prior Approval Number:    Date Approved/Denied:   PASRR Number: [id number]  Discharge Plan: SNF    Current Diagnoses: Patient Active Problem List   Diagnosis Date Noted   Patella fracture 07/02/2023   UTI (urinary tract infection) 07/02/2023   Acute metabolic encephalopathy 07/02/2023   AKI (acute kidney injury) (HCC) 07/02/2023   Metabolic acidosis 07/02/2023   Hyperkalemia 07/02/2023   Adult failure to thrive 09/28/2021   Frequency of micturition 09/22/2021   Hyponatremia 09/21/2021   Severe aortic stenosis 04/12/2017   S/P TAVR (transcatheter aortic valve replacement) 04/12/2017   CKD (chronic kidney disease) stage 4, GFR 15-29 ml/min (HCC) 03/23/2017   AVM (arteriovenous malformation) of stomach, acquired with hemorrhage    S/P placement of cardiac pacemaker 02/16/2017   Anemia in chronic renal disease 02/13/2017   Hypothyroidism (acquired) 06/29/2016   Type 2 diabetes mellitus with stage 4 chronic kidney disease, without long-term current use of insulin  (HCC) 09/09/2015   Benign hypertension with CKD (chronic kidney disease) stage IV (HCC) 08/02/2015    Orientation RESPIRATION BLADDER Height & Weight     Self, Place  Normal Incontinent, External catheter (currently with purewick) Weight: 170 lb (77.1 kg) Height:  5' 3 (160 cm)  BEHAVIORAL SYMPTOMS/MOOD NEUROLOGICAL BOWEL NUTRITION STATUS      Incontinent Diet   AMBULATORY STATUS COMMUNICATION OF NEEDS Skin   Extensive Assist Verbally Normal                       Personal Care Assistance Level of Assistance  Bathing, Dressing Bathing Assistance: Limited assistance   Dressing Assistance: Limited assistance     Functional Limitations Info  Sight, Hearing, Speech Sight Info: Adequate Hearing Info: Impaired Speech Info: Adequate    SPECIAL CARE FACTORS FREQUENCY  OT (By licensed OT), PT (By licensed PT)     PT Frequency: 5x/wk OT Frequency: 5x/wk            Contractures Contractures Info: Not present    Additional Factors Info  Code Status, Allergies Code Status Info: Full Allergies Info: Oxybutynin, Oysters (Shellfish Allergy), Sulfa Antibiotics           Current Medications (07/06/2023):  This is the current hospital active medication list Current Facility-Administered Medications  Medication Dose Route Frequency Provider Last Rate Last Admin   acetaminophen  (TYLENOL ) tablet 650 mg  650 mg Oral Q6H PRN Rathore, Vasundhra, MD   650 mg at 07/05/23 2202   Or   acetaminophen  (TYLENOL ) suppository 650 mg  650 mg Rectal Q6H PRN Alfornia Madison, MD       aspirin  chewable tablet 81 mg  81 mg Oral Daily Amin, Ankit C, MD   81 mg at 07/06/23 0954   atorvastatin  (LIPITOR) tablet 40 mg  40 mg Oral QPM Rathore, Vasundhra, MD   40 mg at 07/05/23 1759   carvedilol  (COREG ) tablet 12.5 mg  12.5 mg Oral BID WC Alfornia Madison, MD  12.5 mg at 07/06/23 9045   docusate sodium  (COLACE) capsule 100 mg  100 mg Oral BID Amin, Ankit C, MD   100 mg at 07/06/23 9045   escitalopram  (LEXAPRO ) tablet 10 mg  10 mg Oral q morning Rathore, Vasundhra, MD   10 mg at 07/06/23 9045   ezetimibe  (ZETIA ) tablet 10 mg  10 mg Oral QPM Rathore, Vasundhra, MD   10 mg at 07/05/23 1759   feeding supplement (BOOST / RESOURCE BREEZE) liquid 1 Container  1 Container Oral TID BM Amin, Ankit C, MD   237 mL at 07/06/23 1402   guaiFENesin  (ROBITUSSIN) 100 MG/5ML  liquid 5 mL  5 mL Oral Q4H PRN Amin, Ankit C, MD       hydrALAZINE  (APRESOLINE ) injection 10 mg  10 mg Intravenous Q4H PRN Amin, Ankit C, MD       HYDROmorphone  (DILAUDID ) injection 0.5 mg  0.5 mg Intravenous Q4H PRN Rathore, Vasundhra, MD   0.5 mg at 07/05/23 9170   insulin  aspart (novoLOG ) injection 0-9 Units  0-9 Units Subcutaneous Q4H Alfornia Madison, MD   3 Units at 07/06/23 1232   ipratropium-albuterol  (DUONEB) 0.5-2.5 (3) MG/3ML nebulizer solution 3 mL  3 mL Nebulization Q4H PRN Amin, Ankit C, MD   3 mL at 07/06/23 1236   isosorbide  mononitrate (IMDUR ) 24 hr tablet 30 mg  30 mg Oral q morning Rathore, Vasundhra, MD   30 mg at 07/06/23 9045   levothyroxine  (SYNTHROID ) tablet 100 mcg  100 mcg Oral Q0600 Amin, Ankit C, MD   100 mcg at 07/06/23 0505   lip balm (CARMEX) ointment   Topical PRN Amin, Ankit C, MD       metoprolol  tartrate (LOPRESSOR ) injection 5 mg  5 mg Intravenous Q4H PRN Amin, Ankit C, MD       ondansetron  (ZOFRAN ) injection 4 mg  4 mg Intravenous Q6H PRN Amin, Ankit C, MD       oxyCODONE  (Oxy IR/ROXICODONE ) immediate release tablet 5 mg  5 mg Oral Q6H PRN Rathore, Vasundhra, MD   5 mg at 07/06/23 0954   senna-docusate (Senokot-S) tablet 1 tablet  1 tablet Oral QHS PRN Amin, Ankit C, MD       sodium bicarbonate  tablet 650 mg  650 mg Oral Daily Rathore, Vasundhra, MD   650 mg at 07/06/23 9045   And   sodium bicarbonate  tablet 1,300 mg  1,300 mg Oral QHS Rathore, Vasundhra, MD   1,300 mg at 07/05/23 2201   traZODone  (DESYREL ) tablet 50 mg  50 mg Oral QHS PRN Amin, Ankit C, MD         Discharge Medications: Please see discharge summary for a list of discharge medications.  Relevant Imaging Results:  Relevant Lab Results:   Additional Information SS# [id number]  NORMAN ASPEN, LCSW

## 2023-07-06 NOTE — Progress Notes (Signed)
 PROGRESS NOTE    Molly Cobb  FMW:[id number] DOB: 1931/09/17 DOA: 07/01/2023 PCP: Fredricka Richerd Ee, MD    Brief Narrative:  89 year old with history of CKD stage IV, CAD, depression, DM2, gout, HTN, hypothyroidism, severe AAS status post TAVR, history of pacemaker, chronic anemia presented to the ED from [facility] [place] for evaluation of right knee and facial pain after a fall.  Per family patient has been more confused over the past few days.  Chest x-ray, x-ray pelvis, CT head, cervical spine, CT maxillofacial did not show any fracture or acute trauma.  There was small laceration of the skin noted over the right chin.  X-ray of the right knee showed mild patellar fracture.  Dr. Vernetta from orthopedic was consulted and patient was placed on knee immobilizer. PT/OT is recommending SNF, awaiting placement at this time.  Assessment & Plan:  Principal Problem:   Patella fracture Active Problems:   Hypothyroidism (acquired)   Type 2 diabetes mellitus with stage 4 chronic kidney disease, without long-term current use of insulin  (HCC)   CKD (chronic kidney disease) stage 4, GFR 15-29 ml/min (HCC)   UTI (urinary tract infection)   Acute metabolic encephalopathy   AKI (acute kidney injury) (HCC)   Metabolic acidosis   Hyperkalemia    Mild right patellar fracture Mechanical fall Secondary to a mechanical fall.  Orthopedics, Dr. Vernetta, consulted. Recommend Knee immobilizer  PT/OT-SNF. B12, vitamin D  normal.   Urinary tract infection Completed 5 days of antibiotics on 1/7   Acute metabolic encephalopathy Possibly related to UTI.  Per ED report, family was concerned that patient has been more confused over the past few days.  At present, patient is AAOx4 and answering questions appropriately.  CT head showing no acute intracranial abnormality and no obvious focal neurodeficit on exam.   CKD stage V Mild metabolic acidosis, mild hyperkalemia Uremia Hyperkalemia resolved, metabolic  acidosis stabilizing.  Baseline creatinine around 3.8-4.5.  BUN is also elevated.  Gentle hydration as needed -Continue outpatient sodium bicarb -Follows with nephrology at [facility]   Right hand pain Supportive care, x-rays negative   Abnormal EKG History of CAD Severe AS s/p TAVR EKG showing new T wave inversions inferolaterally.  Troponins overall remains flat   Depression Continue Lexapro .   Type 2 diabetes On sliding scale and Accu-Cheks   Hypertension Continue Coreg  and Imdur .  IV as needed   Hyperlipidemia Continue Lipitor and Zetia .   Hypothyroidism Elevated TSH, normal free T4.  Increased Synthroid  to 100 mcg. Repeat TFTs outpatient in 4 weeks   Chronic anemia Hemoglobin stable, continue to monitor labs.  PT/OT-SNF TOC consulted  DVT prophylaxis: SCDs Start: 07/02/23 0430    Code Status: Full Code Family Communication:  Daughter updated while I was in the room Currently we are awaiting SNF placement    Subjective: Doing okay no complaints.  Sitting up eating breakfast without any issues At times reports of right-sided chest pain with coughing due to some bone bruising.  No obvious evidence of fracture.  Examination:  General exam: Appears calm and comfortable ; elderly frail  Respiratory system: Clear to auscultation. Respiratory effort normal. Cardiovascular system: S1 & S2 heard, RRR. No JVD, murmurs, rubs, gallops or clicks. No pedal edema. Gastrointestinal system: Abdomen is nondistended, soft and nontender. No organomegaly or masses felt. Normal bowel sounds heard. Central nervous system: Alert and oriented. No focal neurological deficits. Extremities: RLE knee immobilizer in place.  Skin: No rashes, lesions or ulcers Psychiatry: Judgement and insight appear normal.  Mood & affect appropriate.                Diet Orders (From admission, onward)     Start     Ordered   07/03/23 2314  Diet Carb Modified Fluid consistency: Thin;  Room service appropriate? Yes  Diet effective now       Comments: SOFT DIET PLEASE (patient has mouth injury and is having trouble chewing)  Question Answer Comment  Diet-HS Snack? Nothing   Calorie Level Medium 1600-2000   Fluid consistency: Thin   Room service appropriate? Yes      07/03/23 2333            Objective: Vitals:   07/05/23 0457 07/05/23 1418 07/05/23 2219 07/06/23 0525  BP: (!) 147/57 (!) 137/50 (!) 132/51 (!) 149/50  Pulse: 77 72 71 70  Resp: 18 18 17 17   Temp: 98.2 F (36.8 C) 98.7 F (37.1 C) 97.9 F (36.6 C) 98.7 F (37.1 C)  TempSrc: Oral  Oral Oral  SpO2: 95% 100% 100% 98%  Weight:      Height:        Intake/Output Summary (Last 24 hours) at 07/06/2023 1117 Last data filed at 07/06/2023 0959 Gross per 24 hour  Intake 580.63 ml  Output 800 ml  Net -219.37 ml   Filed Weights   07/03/23 2300  Weight: 77.1 kg    Scheduled Meds:  aspirin   81 mg Oral Daily   atorvastatin   40 mg Oral QPM   carvedilol   12.5 mg Oral BID WC   docusate sodium   100 mg Oral BID   escitalopram   10 mg Oral q morning   ezetimibe   10 mg Oral QPM   feeding supplement  1 Container Oral TID BM   insulin  aspart  0-9 Units Subcutaneous Q4H   isosorbide  mononitrate  30 mg Oral q morning   levothyroxine   100 mcg Oral Q0600   sodium bicarbonate   650 mg Oral Daily   And   sodium bicarbonate   1,300 mg Oral QHS   Continuous Infusions:  Nutritional status     Body mass index is 30.11 kg/m.  Data Reviewed:   CBC: Recent Labs  Lab 07/01/23 2125 07/02/23 0442 07/03/23 0415 07/04/23 0351 07/05/23 0342 07/06/23 0335  WBC 9.6 13.3* 10.7* 10.7* 10.0 8.8  NEUTROABS 7.6  --   --   --   --   --   HGB 10.8* 10.2* 9.8* 8.8* 8.6* 8.6*  HCT 33.9* 31.8* 31.1* 28.4* 26.8* 27.2*  MCV 91.1 90.3 91.5 92.8 91.2 90.4  PLT 153 166 147* 137* 141* 141*   Basic Metabolic Panel: Recent Labs  Lab 07/02/23 0432 07/03/23 0415 07/04/23 0351 07/05/23 0342 07/06/23 0335  NA 136 136  134* 134* 131*  K 4.9 4.9 4.6 4.0 4.1  CL 107 107 105 106 102  CO2 20* 19* 21* 19* 18*  GLUCOSE 144* 146* 133* 72 126*  BUN 70* 63* 70* 72* 72*  CREATININE 4.07* 3.76* 4.07* 4.05* 3.45*  CALCIUM  8.8* 8.8* 8.6* 8.3* 8.0*  MG  --  2.1 2.1 2.1 2.1  PHOS  --  4.0  --   --   --    GFR: Estimated Creatinine Clearance: 10.9 mL/min (A) (by C-G formula based on SCr of 3.45 mg/dL (H)). Liver Function Tests: No results for input(s): AST, ALT, ALKPHOS, BILITOT, PROT, ALBUMIN  in the last 168 hours. No results for input(s): LIPASE, AMYLASE in the last 168 hours. Recent Labs  Lab 07/02/23  1402  AMMONIA 17   Coagulation Profile: No results for input(s): INR, PROTIME in the last 168 hours. Cardiac Enzymes: No results for input(s): CKTOTAL, CKMB, CKMBINDEX, TROPONINI in the last 168 hours. BNP (last 3 results) No results for input(s): PROBNP in the last 8760 hours. HbA1C: No results for input(s): HGBA1C in the last 72 hours. CBG: Recent Labs  Lab 07/05/23 1559 07/05/23 2015 07/05/23 2348 07/06/23 0402 07/06/23 0725  GLUCAP 204* 191* 235* 118* 144*   Lipid Profile: No results for input(s): CHOL, HDL, LDLCALC, TRIG, CHOLHDL, LDLDIRECT in the last 72 hours. Thyroid  Function Tests: No results for input(s): TSH, T4TOTAL, FREET4, T3FREE, THYROIDAB in the last 72 hours. Anemia Panel: No results for input(s): VITAMINB12, FOLATE, FERRITIN, TIBC, IRON, RETICCTPCT in the last 72 hours. Sepsis Labs: No results for input(s): PROCALCITON, LATICACIDVEN in the last 168 hours.  Recent Results (from the past 240 hours)  Urine Culture     Status: Abnormal   Collection Time: 07/01/23  9:42 PM   Specimen: Urine, Clean Catch  Result Value Ref Range Status   Specimen Description   Final    URINE, CLEAN CATCH Performed at [facility], [address], [place], [place] [address]    Special Requests   Final     NONE Performed at [facility], [address], [place], [place] [address]    Culture >=100,000 COLONIES/mL ESCHERICHIA COLI (A)  Final   Report Status 07/04/2023 FINAL  Final   Organism ID, Bacteria ESCHERICHIA COLI (A)  Final      Susceptibility   Escherichia coli - MIC*    AMPICILLIN >=32 RESISTANT Resistant     CEFAZOLIN  16 SENSITIVE Sensitive     CEFEPIME <=0.12 SENSITIVE Sensitive     CEFTRIAXONE  1 SENSITIVE Sensitive     CIPROFLOXACIN <=0.25 SENSITIVE Sensitive     GENTAMICIN <=1 SENSITIVE Sensitive     IMIPENEM <=0.25 SENSITIVE Sensitive     NITROFURANTOIN <=16 SENSITIVE Sensitive     TRIMETH/SULFA <=20 SENSITIVE Sensitive     AMPICILLIN/SULBACTAM >=32 RESISTANT Resistant     PIP/TAZO 8 SENSITIVE Sensitive ug/mL    * >=100,000 COLONIES/mL ESCHERICHIA COLI         Radiology Studies: No results found.         LOS: 4 days   Time spent= 35 mins    Burgess JAYSON Dare, MD [facility]  If 7PM-7AM, please contact night-coverage  07/06/2023, 11:17 AM

## 2023-07-07 ENCOUNTER — Inpatient Hospital Stay (HOSPITAL_COMMUNITY): Payer: Medicare Other

## 2023-07-07 DIAGNOSIS — S82001A Unspecified fracture of right patella, initial encounter for closed fracture: Secondary | ICD-10-CM | POA: Diagnosis not present

## 2023-07-07 LAB — CBC
HCT: 25.4 % — ABNORMAL LOW (ref 36.0–46.0)
Hemoglobin: 8 g/dL — ABNORMAL LOW (ref 12.0–15.0)
MCH: 28.5 pg (ref 26.0–34.0)
MCHC: 31.5 g/dL (ref 30.0–36.0)
MCV: 90.4 fL (ref 80.0–100.0)
Platelets: 130 10*3/uL — ABNORMAL LOW (ref 150–400)
RBC: 2.81 MIL/uL — ABNORMAL LOW (ref 3.87–5.11)
RDW: 15.2 % (ref 11.5–15.5)
WBC: 9.5 10*3/uL (ref 4.0–10.5)
nRBC: 0 % (ref 0.0–0.2)

## 2023-07-07 LAB — BASIC METABOLIC PANEL
Anion gap: 10 (ref 5–15)
BUN: 77 mg/dL — ABNORMAL HIGH (ref 8–23)
CO2: 21 mmol/L — ABNORMAL LOW (ref 22–32)
Calcium: 7.9 mg/dL — ABNORMAL LOW (ref 8.9–10.3)
Chloride: 102 mmol/L (ref 98–111)
Creatinine, Ser: 4.25 mg/dL — ABNORMAL HIGH (ref 0.44–1.00)
GFR, Estimated: 9 mL/min — ABNORMAL LOW (ref >60)
Glucose, Bld: 203 mg/dL — ABNORMAL HIGH (ref 70–99)
Potassium: 4.3 mmol/L (ref 3.5–5.1)
Sodium: 133 mmol/L — ABNORMAL LOW (ref 135–145)

## 2023-07-07 LAB — GLUCOSE, CAPILLARY
Glucose-Capillary: 226 mg/dL — ABNORMAL HIGH (ref 70–99)
Glucose-Capillary: 204 mg/dL — ABNORMAL HIGH (ref 70–99)
Glucose-Capillary: 101 mg/dL — ABNORMAL HIGH (ref 70–99)
Glucose-Capillary: 240 mg/dL — ABNORMAL HIGH (ref 70–99)
Glucose-Capillary: 243 mg/dL — ABNORMAL HIGH (ref 70–99)
Glucose-Capillary: 188 mg/dL — ABNORMAL HIGH (ref 70–99)
Glucose-Capillary: 159 mg/dL — ABNORMAL HIGH (ref 70–99)

## 2023-07-07 LAB — MAGNESIUM: Magnesium: 1.8 mg/dL (ref 1.7–2.4)

## 2023-07-07 MED ORDER — SODIUM CHLORIDE 0.9 % IV SOLN: INTRAVENOUS | Status: AC

## 2023-07-07 NOTE — Progress Notes (Signed)
 PROGRESS NOTE    Molly Cobb  FMW:[id number] DOB: 03-14-1932 DOA: 07/01/2023 PCP: Fredricka Richerd Ee, MD    Brief Narrative:  89 year old with history of CKD stage IV, CAD, depression, DM2, gout, HTN, hypothyroidism, severe AAS status post TAVR, history of pacemaker, chronic anemia presented to the ED from [facility] for evaluation of right knee and facial pain after a fall.  Per family patient has been more confused over the past few days.  Chest x-ray, x-ray pelvis, CT head, cervical spine, CT maxillofacial did not show any fracture or acute trauma.  There was small laceration of the skin noted over the right chin.  X-ray of the right knee showed mild patellar fracture.  Dr. Vernetta from orthopedic was consulted and patient was placed on knee immobilizer. PT/OT is recommending SNF, awaiting placement at this time.  Assessment & Plan:  Principal Problem:   Patella fracture Active Problems:   Hypothyroidism (acquired)   Type 2 diabetes mellitus with stage 4 chronic kidney disease, without long-term current use of insulin  (HCC)   CKD (chronic kidney disease) stage 4, GFR 15-29 ml/min (HCC)   UTI (urinary tract infection)   Acute metabolic encephalopathy   AKI (acute kidney injury) (HCC)   Metabolic acidosis   Hyperkalemia    Mild right patellar fracture Mechanical fall Secondary to a mechanical fall.  Orthopedics, Dr. Vernetta, consulted. Recommend Knee immobilizer  PT/OT-SNF. B12, vitamin D  normal.   Urinary tract infection Completed 5 days of antibiotics on 1/7  Atypical right-sided chest pain - Musculoskeletal in nature secondary to her fall.  Initial x-ray was negative, will repeat another x-ray today   Acute metabolic encephalopathy Possibly related to UTI.  Per ED report, family was concerned that patient has been more confused over the past few days.  At present, patient is AAOx4 and answering questions appropriately.  CT head showing no acute intracranial  abnormality and no obvious focal neurodeficit on exam.   CKD stage V Mild metabolic acidosis, mild hyperkalemia; chronic Uremia Hyperkalemia resolved, metabolic acidosis stabilizing.  Baseline creatinine around 3.8-4.5.  BUN is also elevated.  Gentle hydration as needed -Continue outpatient sodium bicarb -Follows with nephrology at [facility]   Right hand pain Supportive care, x-rays negative   Abnormal EKG History of CAD Severe AS s/p TAVR EKG showing new T wave inversions inferolaterally.  Troponins overall remains flat   Depression Continue Lexapro .   Type 2 diabetes On sliding scale and Accu-Cheks   Hypertension Continue Coreg  and Imdur .  IV as needed   Hyperlipidemia Continue Lipitor and Zetia .   Hypothyroidism Elevated TSH, normal free T4.  Increased Synthroid  to 100 mcg. Repeat TFTs outpatient in 4 weeks   Chronic anemia Hemoglobin stable, continue to monitor labs.  PT/OT-SNF TOC consulted  DVT prophylaxis: SCDs Start: 07/02/23 0430    Code Status: Full Code Family Communication:  Daughter updated while I was in the room Currently we are awaiting SNF placement    Subjective: Seen and examined at bedside, feels tired this morning but otherwise no other complaints.  Intermittent right-sided chest pain due to bone bruising causing trouble taking deep breaths at times  Examination:  General exam: Appears calm and comfortable ; elderly frail  Respiratory system: Clear to auscultation. Respiratory effort normal. Cardiovascular system: S1 & S2 heard, RRR. No JVD, murmurs, rubs, gallops or clicks. No pedal edema. Gastrointestinal system: Abdomen is nondistended, soft and nontender. No organomegaly or masses felt. Normal bowel sounds heard. Central nervous system: Alert and oriented. No  focal neurological deficits. Extremities: RLE knee immobilizer in place.  Skin: No rashes, lesions or ulcers Psychiatry: Judgement and insight appear normal. Mood & affect  appropriate.                Diet Orders (From admission, onward)     Start     Ordered   07/03/23 2314  Diet Carb Modified Fluid consistency: Thin; Room service appropriate? Yes  Diet effective now       Comments: SOFT DIET PLEASE (patient has mouth injury and is having trouble chewing)  Question Answer Comment  Diet-HS Snack? Nothing   Calorie Level Medium 1600-2000   Fluid consistency: Thin   Room service appropriate? Yes      07/03/23 2333            Objective: Vitals:   07/06/23 1339 07/06/23 1711 07/06/23 2210 07/07/23 0651  BP: (!) 114/51 (!) 118/43 (!) 114/40 (!) 136/49  Pulse: 72 70 70 75  Resp: 18 18 19 17   Temp: 99.2 F (37.3 C)  99.5 F (37.5 C) 98 F (36.7 C)  TempSrc: Oral  Oral   SpO2: 100% 99% 98% 100%  Weight:      Height:        Intake/Output Summary (Last 24 hours) at 07/07/2023 1124 Last data filed at 07/07/2023 0600 Gross per 24 hour  Intake 707 ml  Output 300 ml  Net 407 ml   Filed Weights   07/03/23 2300  Weight: 77.1 kg    Scheduled Meds:  aspirin   81 mg Oral Daily   atorvastatin   40 mg Oral QPM   carvedilol   12.5 mg Oral BID WC   docusate sodium   100 mg Oral BID   escitalopram   10 mg Oral q morning   ezetimibe   10 mg Oral QPM   feeding supplement  1 Container Oral TID BM   insulin  aspart  0-9 Units Subcutaneous Q4H   isosorbide  mononitrate  30 mg Oral q morning   levothyroxine   100 mcg Oral Q0600   sodium bicarbonate   650 mg Oral Daily   And   sodium bicarbonate   1,300 mg Oral QHS   Continuous Infusions:  sodium chloride  100 mL/hr at 07/07/23 9047    Nutritional status     Body mass index is 30.11 kg/m.  Data Reviewed:   CBC: Recent Labs  Lab 07/01/23 2125 07/02/23 0442 07/03/23 0415 07/04/23 0351 07/05/23 0342 07/06/23 0335 07/07/23 0318  WBC 9.6   < > 10.7* 10.7* 10.0 8.8 9.5  NEUTROABS 7.6  --   --   --   --   --   --   HGB 10.8*   < > 9.8* 8.8* 8.6* 8.6* 8.0*  HCT 33.9*   < > 31.1* 28.4* 26.8*  27.2* 25.4*  MCV 91.1   < > 91.5 92.8 91.2 90.4 90.4  PLT 153   < > 147* 137* 141* 141* 130*   < > = values in this interval not displayed.   Basic Metabolic Panel: Recent Labs  Lab 07/03/23 0415 07/04/23 0351 07/05/23 0342 07/06/23 0335 07/07/23 0318  NA 136 134* 134* 131* 133*  K 4.9 4.6 4.0 4.1 4.3  CL 107 105 106 102 102  CO2 19* 21* 19* 18* 21*  GLUCOSE 146* 133* 72 126* 203*  BUN 63* 70* 72* 72* 77*  CREATININE 3.76* 4.07* 4.05* 3.45* 4.25*  CALCIUM  8.8* 8.6* 8.3* 8.0* 7.9*  MG 2.1 2.1 2.1 2.1 1.8  PHOS 4.0  --   --   --   --  GFR: Estimated Creatinine Clearance: 8.8 mL/min (A) (by C-G formula based on SCr of 4.25 mg/dL (H)). Liver Function Tests: No results for input(s): AST, ALT, ALKPHOS, BILITOT, PROT, ALBUMIN  in the last 168 hours. No results for input(s): LIPASE, AMYLASE in the last 168 hours. Recent Labs  Lab 07/02/23 1402  AMMONIA 17   Coagulation Profile: No results for input(s): INR, PROTIME in the last 168 hours. Cardiac Enzymes: No results for input(s): CKTOTAL, CKMB, CKMBINDEX, TROPONINI in the last 168 hours. BNP (last 3 results) No results for input(s): PROBNP in the last 8760 hours. HbA1C: No results for input(s): HGBA1C in the last 72 hours. CBG: Recent Labs  Lab 07/06/23 2002 07/06/23 2026 07/07/23 0116 07/07/23 0410 07/07/23 0755  GLUCAP 215* 200* 226* 204* 101*   Lipid Profile: No results for input(s): CHOL, HDL, LDLCALC, TRIG, CHOLHDL, LDLDIRECT in the last 72 hours. Thyroid  Function Tests: No results for input(s): TSH, T4TOTAL, FREET4, T3FREE, THYROIDAB in the last 72 hours. Anemia Panel: No results for input(s): VITAMINB12, FOLATE, FERRITIN, TIBC, IRON, RETICCTPCT in the last 72 hours. Sepsis Labs: No results for input(s): PROCALCITON, LATICACIDVEN in the last 168 hours.  Recent Results (from the past 240 hours)  Urine Culture     Status: Abnormal    Collection Time: 07/01/23  9:42 PM   Specimen: Urine, Clean Catch  Result Value Ref Range Status   Specimen Description   Final    URINE, CLEAN CATCH Performed at [facility], [address] [place], [place] [address]    Special Requests   Final    NONE Performed at [facility], [address], [place], [place] [address]    Culture >=100,000 COLONIES/mL ESCHERICHIA COLI (A)  Final   Report Status 07/04/2023 FINAL  Final   Organism ID, Bacteria ESCHERICHIA COLI (A)  Final      Susceptibility   Escherichia coli - MIC*    AMPICILLIN >=32 RESISTANT Resistant     CEFAZOLIN  16 SENSITIVE Sensitive     CEFEPIME <=0.12 SENSITIVE Sensitive     CEFTRIAXONE  1 SENSITIVE Sensitive     CIPROFLOXACIN <=0.25 SENSITIVE Sensitive     GENTAMICIN <=1 SENSITIVE Sensitive     IMIPENEM <=0.25 SENSITIVE Sensitive     NITROFURANTOIN <=16 SENSITIVE Sensitive     TRIMETH/SULFA <=20 SENSITIVE Sensitive     AMPICILLIN/SULBACTAM >=32 RESISTANT Resistant     PIP/TAZO 8 SENSITIVE Sensitive ug/mL    * >=100,000 COLONIES/mL ESCHERICHIA COLI         Radiology Studies: No results found.         LOS: 5 days   Time spent= 35 mins    Burgess JAYSON Dare, MD [facility]  If 7PM-7AM, please contact night-coverage  07/07/2023, 11:24 AM

## 2023-07-07 NOTE — Plan of Care (Signed)
 Problem: Coping: Goal: Ability to adjust to condition or change in health will improve Outcome: Progressing   Problem: Clinical Measurements: Goal: Ability to maintain clinical measurements within normal limits will improve Outcome: Progressing   Problem: Pain Management: Goal: General experience of comfort will improve Outcome: Progressing   Problem: Safety: Goal: Ability to remain free from injury will improve Outcome: Progressing    Jon LULLA Reins, RN 07/07/23 7:19 PM

## 2023-07-07 NOTE — Progress Notes (Signed)
 Occupational Therapy Treatment Patient Details Name: Molly Cobb MRN: [id number] DOB: Oct 28, 1931 Today's Date: 07/07/2023   History of present illness Patient is a 89 year old female who presented on 1/3 with pain in R knee and face after fall. Patient was found to have mild patellar fracture, UTI, and acute metabolic encephalopathy. PMH: CKD, CAD, depression, type II DM, gout, hypertension, hypothyroidism, severe aortic stenosis with history of TAVR, history of pacemaker placement, hypothyroidism.   OT comments  Pt declining ADL participation at this time; session focuses instead on functional transfers and mobility. R knee brace donned. Pt performing bed mobility with mod-max +2, heavy assist at trunk, cues for hand placement and to scoot hips forward. x2 attempts at sit to stand transfers with mod a +2. Pt benefits from max multimodal cuing for hand placement. Tolerates standing for ~10 seconds. Small steps with RW laterally with mod a +2. Pt pleased with progress. OT will continue to follow for functional gains. Patient will benefit from continued inpatient follow up therapy, <3 hours/day       If plan is discharge home, recommend the following:  Assistance with cooking/housework;Direct supervision/assist for medications management;Assist for transportation;Help with stairs or ramp for entrance;Direct supervision/assist for financial management;A lot of help with bathing/dressing/bathroom;A lot of help with walking and/or transfers   Equipment Recommendations  None recommended by OT    Recommendations for Other Services      Precautions / Restrictions Precautions Precautions: Fall Required Braces or Orthoses: Other Brace Other Brace: bledsoe Restrictions Weight Bearing Restrictions Per Provider Order: Yes RLE Weight Bearing Per Provider Order: Weight bearing as tolerated Other Position/Activity Restrictions: with bledsoe brace       Mobility Bed Mobility Overal bed mobility: Needs  Assistance Bed Mobility: Supine to Sit, Sit to Supine     Supine to sit: Mod assist, +2 for physical assistance, HOB elevated, Used rails Sit to supine: Mod assist, +2 for physical assistance, Max assist        Transfers Overall transfer level: Needs assistance Equipment used: Rolling walker (2 wheels) Transfers: Sit to/from Stand Sit to Stand: Mod assist, From elevated surface, +2 physical assistance                 Balance Overall balance assessment: Needs assistance Sitting-balance support: Feet supported, Single extremity supported Sitting balance-Leahy Scale: Fair     Standing balance support: During functional activity, Bilateral upper extremity supported, Reliant on assistive device for balance Standing balance-Leahy Scale: Poor                             ADL either performed or assessed with clinical judgement   ADL Overall ADL's : Needs assistance/impaired                                     Functional mobility during ADLs: Moderate assistance;+2 for physical assistance;Cueing for safety;Cueing for sequencing;Rolling walker (2 wheels) General ADL Comments: Functional transfers performed +2 mod A      Cognition Arousal: Alert Behavior During Therapy: WFL for tasks assessed/performed Overall Cognitive Status: No family/caregiver present to determine baseline cognitive functioning                                 General Comments: pt often tangential, talking about random topics but easily directable  General Comments pt c/o r sided chest pain, RN notified    Pertinent Vitals/ Pain       Pain Assessment Pain Assessment: Faces Faces Pain Scale: Hurts whole lot Pain Location: right knee Pain Descriptors / Indicators: Grimacing, Sore Pain Intervention(s): Limited activity within patient's tolerance, Premedicated before session, Monitored during session         Frequency  Min 1X/week         Progress Toward Goals  OT Goals(current goals can now be found in the care plan section)  Progress towards OT goals: Progressing toward goals      AM-PAC OT 6 Clicks Daily Activity     Outcome Measure   Help from another person eating meals?: A Little Help from another person taking care of personal grooming?: A Little Help from another person toileting, which includes using toliet, bedpan, or urinal?: A Lot Help from another person bathing (including washing, rinsing, drying)?: A Lot Help from another person to put on and taking off regular upper body clothing?: A Little Help from another person to put on and taking off regular lower body clothing?: A Lot 6 Click Score: 15    End of Session Equipment Utilized During Treatment: Gait belt;Rolling walker (2 wheels);Other (comment) (brace)  OT Visit Diagnosis: Unsteadiness on feet (R26.81);Other abnormalities of gait and mobility (R26.89);Pain Pain - Right/Left: Right Pain - part of body: Knee   Activity Tolerance Patient tolerated treatment well   Patient Left in bed;with bed alarm set;with call bell/phone within reach   Nurse Communication Mobility status        Time: 8460-8397 OT Time Calculation (min): 23 min  Charges: OT General Charges $OT Visit: 1 Visit OT Treatments $Therapeutic Activity: 8-22 mins  Allycia Pitz L. Titianna Loomis, OTR/L  07/07/23, 4:45 PM

## 2023-07-07 NOTE — Progress Notes (Signed)
 Physical Therapy Treatment Patient Details Name: Molly Cobb MRN: [id number] DOB: 13-Sep-1931 Today's Date: 07/07/2023   History of Present Illness Patient is a 89 year old female who presented on 1/3 with pain in R knee and face after fall. Patient was found to have mild patellar fracture, UTI, and acute metabolic encephalopathy. PMH: CKD, CAD, depression, type II DM, gout, hypertension, hypothyroidism, severe aortic stenosis with history of TAVR, history of pacemaker placement, hypothyroidism.    PT Comments  Pt sleeping on arrival and briefly awakening to verbal stimuli. Pt assisted with sitting EOB however remained lethargic so returned pt to supine.  Daughter present and attempted to provide encouragement.  RN notified pt was not able to participate, appears lethargic, and RN reported pt had not received pain meds.    If plan is discharge home, recommend the following: A lot of help with walking and/or transfers;A lot of help with bathing/dressing/bathroom;Assistance with cooking/housework;Assist for transportation;Help with stairs or ramp for entrance   Can travel by private vehicle     No  Equipment Recommendations  None recommended by PT    Recommendations for Other Services       Precautions / Restrictions Precautions Precautions: Fall Required Braces or Orthoses: Other Brace Other Brace: bledsoe Restrictions RLE Weight Bearing Per Provider Order: Weight bearing as tolerated Other Position/Activity Restrictions: with bledsoe brace     Mobility  Bed Mobility Overal bed mobility: Needs Assistance Bed Mobility: Supine to Sit, Sit to Supine     Supine to sit: Total assist, +2 for physical assistance Sit to supine: Total assist, +2 for physical assistance   General bed mobility comments: attempted to sit EOB for pt to become more awake/alert however pt only briefly would open eyes to questions and then quickly close them, also started leaning forwards and sideways with eyes  closed so returned to supine; pt not assisting in movement despite multimodal cues provided    Transfers                        Ambulation/Gait                   Stairs             Wheelchair Mobility     Tilt Bed    Modified Rankin (Stroke Patients Only)       Balance Overall balance assessment: Needs assistance Sitting-balance support: Feet supported, Single extremity supported Sitting balance-Leahy Scale: Zero Sitting balance - Comments: pt briefly able to maintain sitting EOB without assist however due to cognition required more assist                                    Cognition Arousal: Lethargic   Overall Cognitive Status: Difficult to assess                                 General Comments: daughter present but not stating whether grogginess or decreased alertness baseline for pt, daughter attempted to provide commands however pt requiring significant assist and not following cues        Exercises      General Comments        Pertinent Vitals/Pain Pain Assessment Pain Assessment: Faces Faces Pain Scale: Hurts whole lot Pain Location: right knee Pain Descriptors / Indicators: Grimacing, Sore Pain Intervention(s):  Monitored during session, Repositioned (RN reports no pain meds given)    Home Living                          Prior Function            PT Goals (current goals can now be found in the care plan section) Progress towards PT goals: Progressing toward goals    Frequency    Min 1X/week      PT Plan      Co-evaluation              AM-PAC PT 6 Clicks Mobility   Outcome Measure  Help needed turning from your back to your side while in a flat bed without using bedrails?: A Lot Help needed moving from lying on your back to sitting on the side of a flat bed without using bedrails?: A Lot Help needed moving to and from a bed to a chair (including a wheelchair)?:  Total Help needed standing up from a chair using your arms (e.g., wheelchair or bedside chair)?: Total Help needed to walk in hospital room?: Total Help needed climbing 3-5 steps with a railing? : Total 6 Click Score: 8    End of Session   Activity Tolerance: Patient limited by lethargy Patient left: in bed;with call bell/phone within reach;with bed alarm set;with family/visitor present Nurse Communication: Mobility status PT Visit Diagnosis: Difficulty in walking, not elsewhere classified (R26.2);Muscle weakness (generalized) (M62.81)     Time: 8868-8854 PT Time Calculation (min) (ACUTE ONLY): 14 min  Charges:    $Therapeutic Activity: 8-22 mins PT General Charges $$ ACUTE PT VISIT: 1 Visit                    Molly PT, DPT Physical Therapist Acute Rehabilitation Services Office: [phone]    Molly Cobb [name] 07/07/2023, 2:45 PM

## 2023-07-07 NOTE — Progress Notes (Signed)
 PT notified RN that she was unable to complete session with patient due to drowsiness. Patient has been drowsy throughout the morning, but when awakened responds appropriately. Has not received pain meds. Family at bedside report that patient is not a morning person and will sometimes sleep until 10 or 11am. MD updated. Jon LULLA Reins, RN 07/07/23 11:50 AM

## 2023-07-08 DIAGNOSIS — E875 Hyperkalemia: Secondary | ICD-10-CM

## 2023-07-08 DIAGNOSIS — N179 Acute kidney failure, unspecified: Secondary | ICD-10-CM | POA: Diagnosis not present

## 2023-07-08 DIAGNOSIS — N184 Chronic kidney disease, stage 4 (severe): Secondary | ICD-10-CM | POA: Diagnosis not present

## 2023-07-08 DIAGNOSIS — G9341 Metabolic encephalopathy: Secondary | ICD-10-CM

## 2023-07-08 DIAGNOSIS — E1122 Type 2 diabetes mellitus with diabetic chronic kidney disease: Secondary | ICD-10-CM

## 2023-07-08 DIAGNOSIS — E872 Acidosis, unspecified: Secondary | ICD-10-CM

## 2023-07-08 DIAGNOSIS — S82001A Unspecified fracture of right patella, initial encounter for closed fracture: Secondary | ICD-10-CM | POA: Diagnosis not present

## 2023-07-08 DIAGNOSIS — E039 Hypothyroidism, unspecified: Secondary | ICD-10-CM

## 2023-07-08 LAB — BASIC METABOLIC PANEL
Anion gap: 9 (ref 5–15)
BUN: 81 mg/dL — ABNORMAL HIGH (ref 8–23)
CO2: 20 mmol/L — ABNORMAL LOW (ref 22–32)
Calcium: 8.2 mg/dL — ABNORMAL LOW (ref 8.9–10.3)
Chloride: 104 mmol/L (ref 98–111)
Creatinine, Ser: 3.65 mg/dL — ABNORMAL HIGH (ref 0.44–1.00)
GFR, Estimated: 11 mL/min — ABNORMAL LOW (ref >60)
Glucose, Bld: 159 mg/dL — ABNORMAL HIGH (ref 70–99)
Potassium: 4.2 mmol/L (ref 3.5–5.1)
Sodium: 133 mmol/L — ABNORMAL LOW (ref 135–145)

## 2023-07-08 LAB — GLUCOSE, CAPILLARY
Glucose-Capillary: 114 mg/dL — ABNORMAL HIGH (ref 70–99)
Glucose-Capillary: 127 mg/dL — ABNORMAL HIGH (ref 70–99)
Glucose-Capillary: 152 mg/dL — ABNORMAL HIGH (ref 70–99)
Glucose-Capillary: 159 mg/dL — ABNORMAL HIGH (ref 70–99)
Glucose-Capillary: 213 mg/dL — ABNORMAL HIGH (ref 70–99)
Glucose-Capillary: 241 mg/dL — ABNORMAL HIGH (ref 70–99)
Glucose-Capillary: 252 mg/dL — ABNORMAL HIGH (ref 70–99)

## 2023-07-08 LAB — CBC
HCT: 26.8 % — ABNORMAL LOW (ref 36.0–46.0)
Hemoglobin: 8.3 g/dL — ABNORMAL LOW (ref 12.0–15.0)
MCH: 28.1 pg (ref 26.0–34.0)
MCHC: 31 g/dL (ref 30.0–36.0)
MCV: 90.8 fL (ref 80.0–100.0)
Platelets: 155 10*3/uL (ref 150–400)
RBC: 2.95 MIL/uL — ABNORMAL LOW (ref 3.87–5.11)
RDW: 15 % (ref 11.5–15.5)
WBC: 9.6 10*3/uL (ref 4.0–10.5)
nRBC: 0 % (ref 0.0–0.2)

## 2023-07-08 LAB — MAGNESIUM: Magnesium: 2 mg/dL (ref 1.7–2.4)

## 2023-07-08 MED ORDER — POLYETHYLENE GLYCOL 3350 17 G PO PACK
17.0000 g | PACK | Freq: Two times a day (BID) | ORAL | Status: DC
  Administered 2023-07-08 – 2023-07-13 (×10): 17 g via ORAL
  Filled 2023-07-08 (×10): qty 1

## 2023-07-08 MED ORDER — DICLOFENAC SODIUM 1 % EX GEL
2.0000 g | Freq: Four times a day (QID) | CUTANEOUS | Status: DC
  Administered 2023-07-08 – 2023-07-13 (×19): 2 g via TOPICAL
  Filled 2023-07-08 (×2): qty 100

## 2023-07-08 MED ORDER — SIMETHICONE 80 MG PO CHEW: 80.0000 mg | CHEWABLE_TABLET | Freq: Four times a day (QID) | ORAL | Status: DC | PRN

## 2023-07-08 MED ORDER — SENNOSIDES-DOCUSATE SODIUM 8.6-50 MG PO TABS
1.0000 | ORAL_TABLET | Freq: Two times a day (BID) | ORAL | Status: DC
  Administered 2023-07-08 – 2023-07-13 (×11): 1 via ORAL
  Filled 2023-07-08 (×11): qty 1

## 2023-07-08 MED ORDER — BISACODYL 10 MG RE SUPP
10.0000 mg | Freq: Once | RECTAL | Status: AC
  Administered 2023-07-08: 10 mg via RECTAL
  Filled 2023-07-08: qty 1

## 2023-07-08 MED ORDER — LIDOCAINE 5 % EX PTCH
1.0000 | MEDICATED_PATCH | CUTANEOUS | Status: DC
  Administered 2023-07-08 – 2023-07-12 (×5): 1 via TRANSDERMAL
  Filled 2023-07-08 (×6): qty 1

## 2023-07-08 NOTE — TOC Progression Note (Signed)
 Transition of Care [facility]) - Progression Note    Patient Details  Name: Molly Cobb MRN: [id number] Date of Birth: 09/01/1931  Transition of [facility]) CM/SW Contact  NORMAN ASPEN, LCSW Phone Number: 07/08/2023, 3:04 PM  Clinical Narrative:    Have reviewed SNF bed offers with pt/ daughter and bed accepted at [facility] in [place].  Insurance authorization started this morning but is still pending decision.  Family and facility are aware.  Hopeful will receive auth by tomorrow.  Admissions coordinator, Tamera, confirms that facility can do weekend admit if shara comes through and that she will be the contact person.  Will alert weekend TOC coverage.     Barriers to Discharge: English As A Second Language Teacher, SNF Pending bed offer  Expected Discharge Plan and Services In-house Referral: Clinical Social Work     Living arrangements for the past 2 months: Independent Living Facility                                       Social Determinants of Health (SDOH) Interventions SDOH Screenings   Food Insecurity: No Food Insecurity (07/02/2023)  Housing: Low Risk  (07/02/2023)  Transportation Needs: No Transportation Needs (07/02/2023)  Utilities: Not At Risk (07/02/2023)  Financial Resource Strain: Low Risk  (01/11/2022)   [facility] to 08/28/2022[facility] visits prior to 08/28/2022.  Physical Activity: Unknown (01/11/2022)   [facility] from [facility], [facility] visits prior to 08/28/2022., [facility] visits prior to 08/28/2022., [facility], [facility] visits prior to 08/28/2022.  Social Connections: Unknown (07/02/2023)  Stress: Stress Concern Present (01/11/2022)   [facility] visits prior to 08/28/2022., [facility] visits prior to  08/28/2022.  Tobacco Use: Low Risk  (07/02/2023)    Readmission Risk Interventions    07/05/2023    2:24 PM 09/24/2021   11:03 AM  Readmission Risk Prevention Plan  Transportation Screening Complete Complete  PCP or Specialist Appt within 3-5 Days Complete Complete  HRI or Home Care Consult Complete Complete  Social Work Consult for Recovery Care Planning/Counseling Complete Complete  Palliative Care Screening Not Applicable Not Applicable  Medication Review Oceanographer) Complete Complete

## 2023-07-08 NOTE — Progress Notes (Signed)
 PROGRESS NOTE    Molly Cobb  FMW:[id number] DOB: 05-10-1932 DOA: 07/01/2023 PCP: Fredricka Richerd Ee, MD   Brief Narrative:  The patient is an elderly 89 year old AAF with history of CKD stage IV, CAD, depression, DM2, gout, HTN, hypothyroidism, severe AAS status post TAVR, history of pacemaker, chronic anemia presented to the ED from [facility] [place] for evaluation of right knee and facial pain after a fall.  Per family patient has been more confused over the past few days. Chest x-ray, x-ray pelvis, CT head, cervical spine, CT maxillofacial did not show any fracture or acute trauma.  There was small laceration of the skin noted over the right chin.  X-ray of the right knee showed mild patellar fracture.  Dr. Vernetta from orthopedic was consulted and patient was placed in knee immobilizer and the recommendation is for her to have her knee stay fully extended for the next 6 weeks to allow the fracture to heal with weightbearing as tolerated knee brace and outpatient follow-up in 1 to 2 weeks for repeat knee x-rays. PT/OT is recommending SNF, awaiting placement at this time.  Assessment & Plan:  Principal Problem:   Patella fracture Active Problems:   Hypothyroidism (acquired)   Type 2 diabetes mellitus with stage 4 chronic kidney disease, without long-term current use of insulin  (HCC)   CKD (chronic kidney disease) stage 4, GFR 15-29 ml/min (HCC)   UTI (urinary tract infection)   Acute metabolic encephalopathy   AKI (acute kidney injury) (HCC)   Metabolic acidosis   Hyperkalemia   Mild Right Patellar Fracture Mechanical fall -Secondary to a mechanical fall.  Orthopedics, Dr. Vernetta, consulted. -Recommend Knee immobilizer for 6 weeks -PT/OT-SNF. B12, vitamin D  normal.  Constipation -Bowel regimen initiated   Urinary Tract Infection -Completed 5 days of antibiotics on 1/7  Atypical Right-sided Chest Pain -Musculoskeletal in nature secondary to her fall.  Initial x-ray was  negative, will repeat X-Ray done yesterday -Add Lidocaine  Patch and Diclofenac  Gel   Acute Metabolic Encephalopathy -Possibly related to UTI.   -Per ED report, family was concerned that patient has been more confused over the past few days.   -At present, patient is AAOx4 and answering questions appropriately.   -CT head showing no acute intracranial abnormality and no obvious focal neurodeficit on exam.   CKD stage V Mild metabolic acidosis, mild hyperkalemia; chronic Uremia -Hyperkalemia resolved and metabolic acidosis stabilizing.  -Baseline creatinine around 3.8-4.5.  BUN is also elevated.  Gentle hydration as needed -BUN/Cr Trend: Recent Labs  Lab 07/02/23 0432 07/03/23 0415 07/04/23 0351 07/05/23 0342 07/06/23 0335 07/07/23 0318 07/08/23 0337  BUN 70* 63* 70* 72* 72* 77* 81*  CREATININE 4.07* 3.76* 4.07* 4.05* 3.45* 4.25* 3.65*  -K+ Trend: Recent Labs  Lab 07/02/23 0432 07/03/23 0415 07/04/23 0351 07/05/23 0342 07/06/23 0335 07/07/23 0318 07/08/23 0337  K 4.9 4.9 4.6 4.0 4.1 4.3 4.2  -Patient's Metabolic Acidosis showed a CO2 of 20, anion gap of 9, chloride level of 104 -Avoid Nephrotoxic Medications, Contrast Dyes, Hypotension and Dehydration to Ensure Adequate Renal Perfusion and will need to Renally Adjust Meds -Continue to Monitor and Trend Renal Function carefully and repeat CMP in the AM  -Continue outpatient sodium bicarb -Follows with Nephrology at [facility]   Right hand pain -C/w Supportive care, x-rays negative   Abnormal EKG History of CAD Severe AS s/p TAVR -EKG showing new T wave inversions inferolaterally.  Troponins overall remains flat -Has Right sided CP -CXR as above and showed No acute cardiopulmonary  abnormality.   Depression -Continue Escitalopram  10 mg p every Morning .  Hyponatremia -Na+ Trend: Recent Labs  Lab 07/02/23 0432 07/03/23 0415 07/04/23 0351 07/05/23 0342 07/06/23 0335 07/07/23 0318 07/08/23 0337  NA 136  136 134* 134* 131* 133* 133*  -Continue to Monitor and Trend and repeat CMP in the AM   Type 2 Diabetes Mellitus  -On Sensitive sliding scale and Accu-Cheks -CBG Trend: Recent Labs  Lab 07/07/23 2005 07/07/23 2128 07/08/23 0010 07/08/23 0407 07/08/23 0738 07/08/23 1133 07/08/23 1724  GLUCAP 188* 159* 159* 152* 114* 252* 213*   Hypertension -Continue Carvedilol  12.5 mg po BID and and Isosorbide  Mononitrate 30 mg po Daily.  -C/w IV Hydralazine  10 mg q4hprn SBP >180   Hyperlipidemia -Continue Atorvastatin  40 mg po Daily and Ezetimibe  10 mg po every Evening . Hypothyroidism -Elevated TSH, normal free T4.  Increased Levothyroxine  to 100 mcg. -Repeat TFTs outpatient in 4 weeks   Chronic Normocytic Anemia -Hgb/Hct Trend: Recent Labs  Lab 07/02/23 0442 07/03/23 0415 07/04/23 0351 07/05/23 0342 07/06/23 0335 07/07/23 0318 07/08/23 0337  HGB 10.2* 9.8* 8.8* 8.6* 8.6* 8.0* 8.3*  HCT 31.8* 31.1* 28.4* 26.8* 27.2* 25.4* 26.8*  MCV 90.3 91.5 92.8 91.2 90.4 90.4 90.8  -Continue to Monitor and Trend and repeat CBC in the AM   Class I Obesity -Complicates overall prognosis and care -Estimated body mass index is 30.11 kg/m as calculated from the following:   Height as of this encounter: 5' 3 (1.6 m).   Weight as of this encounter: 77.1 kg.  -Weight Loss and Dietary Counseling given   PT/OT-SNF -TOC consulted and awaiting for Insurance Authorization    DVT prophylaxis: SCDs Start: 07/02/23 0430    Code Status: Full Code Family Communication: Discussed with Daughter at bedside   Disposition Plan:  Level of care: Med-Surg Status is: Inpatient Remains inpatient appropriate because: Needs Insurance Authorization and Bed Availability    Consultants:  Orthopedic Surgery  Procedures:  As delineated as above  Antimicrobials:  Anti-infectives (From admission, onward)    Start     Dose/Rate Route Frequency Ordered Stop   07/04/23 2200  ceFAZolin  (ANCEF ) IVPB 1 g/50 mL  premix  Status:  Discontinued        1 g 100 mL/hr over 30 Minutes Intravenous Every 24 hours 07/04/23 1127 07/06/23 0804   07/02/23 2100  cefTRIAXone  (ROCEPHIN ) 1 g in sodium chloride  0.9 % 100 mL IVPB  Status:  Discontinued        1 g 200 mL/hr over 30 Minutes Intravenous Every 24 hours 07/02/23 0432 07/04/23 1126   07/01/23 2145  cefTRIAXone  (ROCEPHIN ) 1 g in sodium chloride  0.9 % 100 mL IVPB        1 g 200 mL/hr over 30 Minutes Intravenous  Once 07/01/23 2141 07/02/23 1522       Subjective: Seen and examined at bedside and was still having some pain and discomfort specifically on the right side.  Also has not had a bowel movement in a few days.  No nausea or vomit.  Denied any lightheadedness or dizziness.  Feels okay but just feels weak.  No other concerns or complaints at this time.  Objective: Vitals:   07/07/23 1338 07/08/23 0009 07/08/23 0525 07/08/23 1327  BP: (!) 113/48 (!) 121/42 (!) 155/66 (!) 127/52  Pulse: 70 70 72 71  Resp: 18 18 18 17   Temp: 98.3 F (36.8 C) 98.3 F (36.8 C) (!) 97.3 F (36.3 C) 98.2 F (36.8 C)  TempSrc: Oral   Oral  SpO2: 99% 100% 100% 100%  Weight:      Height:        Intake/Output Summary (Last 24 hours) at 07/08/2023 1906 Last data filed at 07/08/2023 1020 Gross per 24 hour  Intake 1258.24 ml  Output 650 ml  Net 608.24 ml   Filed Weights   07/03/23 2300  Weight: 77.1 kg   Examination: Physical Exam:  Constitutional: WN/WD obese chronically ill-appearing African-American female in no acute distress but appears a little uncomfortable Respiratory: Diminished to auscultation bilaterally with some coarse breath sounds, no wheezing, rales, rhonchi or crackles. Normal respiratory effort and patient is not tachypenic. No accessory muscle use.  Unlabored breathing and is not wearing supplemental oxygen via nasal cannula Cardiovascular: RRR, no murmurs / rubs / gallops. S1 and S2 auscultated. No extremity edema. Abdomen: Soft, non-tender,  distended secondary body habitus. Bowel sounds positive.  GU: Deferred. Musculoskeletal: No clubbing / cyanosis of digits/nails. No joint deformity upper and lower extremities. Skin: No rashes, lesions, ulcers on limited skin evaluation. No induration; Warm and dry.  Neurologic: CN 2-12 grossly intact with no focal deficits. Romberg sign and cerebellar reflexes not assessed.  Psychiatric: Normal judgment and insight. Alert and oriented x 3.  A little anxious mood and appropriate affect.   Data Reviewed: I have personally reviewed following labs and imaging studies  CBC: Recent Labs  Lab 07/01/23 2125 07/02/23 0442 07/04/23 0351 07/05/23 0342 07/06/23 0335 07/07/23 0318 07/08/23 0337  WBC 9.6   < > 10.7* 10.0 8.8 9.5 9.6  NEUTROABS 7.6  --   --   --   --   --   --   HGB 10.8*   < > 8.8* 8.6* 8.6* 8.0* 8.3*  HCT 33.9*   < > 28.4* 26.8* 27.2* 25.4* 26.8*  MCV 91.1   < > 92.8 91.2 90.4 90.4 90.8  PLT 153   < > 137* 141* 141* 130* 155   < > = values in this interval not displayed.   Basic Metabolic Panel: Recent Labs  Lab 07/03/23 0415 07/04/23 0351 07/05/23 0342 07/06/23 0335 07/07/23 0318 07/08/23 0337  NA 136 134* 134* 131* 133* 133*  K 4.9 4.6 4.0 4.1 4.3 4.2  CL 107 105 106 102 102 104  CO2 19* 21* 19* 18* 21* 20*  GLUCOSE 146* 133* 72 126* 203* 159*  BUN 63* 70* 72* 72* 77* 81*  CREATININE 3.76* 4.07* 4.05* 3.45* 4.25* 3.65*  CALCIUM  8.8* 8.6* 8.3* 8.0* 7.9* 8.2*  MG 2.1 2.1 2.1 2.1 1.8 2.0  PHOS 4.0  --   --   --   --   --    GFR: Estimated Creatinine Clearance: 10.3 mL/min (A) (by C-G formula based on SCr of 3.65 mg/dL (H)). Liver Function Tests: No results for input(s): AST, ALT, ALKPHOS, BILITOT, PROT, ALBUMIN  in the last 168 hours. No results for input(s): LIPASE, AMYLASE in the last 168 hours. Recent Labs  Lab 07/02/23 1402  AMMONIA 17   Coagulation Profile: No results for input(s): INR, PROTIME in the last 168 hours. Cardiac  Enzymes: No results for input(s): CKTOTAL, CKMB, CKMBINDEX, TROPONINI in the last 168 hours. BNP (last 3 results) No results for input(s): PROBNP in the last 8760 hours. HbA1C: No results for input(s): HGBA1C in the last 72 hours. CBG: Recent Labs  Lab 07/08/23 0010 07/08/23 0407 07/08/23 0738 07/08/23 1133 07/08/23 1724  GLUCAP 159* 152* 114* 252* 213*   Lipid Profile: No results  for input(s): CHOL, HDL, LDLCALC, TRIG, CHOLHDL, LDLDIRECT in the last 72 hours. Thyroid  Function Tests: No results for input(s): TSH, T4TOTAL, FREET4, T3FREE, THYROIDAB in the last 72 hours. Anemia Panel: No results for input(s): VITAMINB12, FOLATE, FERRITIN, TIBC, IRON, RETICCTPCT in the last 72 hours. Sepsis Labs: No results for input(s): PROCALCITON, LATICACIDVEN in the last 168 hours.  Recent Results (from the past 240 hours)  Urine Culture     Status: Abnormal   Collection Time: 07/01/23  9:42 PM   Specimen: Urine, Clean Catch  Result Value Ref Range Status   Specimen Description   Final    URINE, CLEAN CATCH Performed at [facility], [address] [place], [place] [address]    Special Requests   Final    NONE Performed at [facility], [address], [place], [place] [address]    Culture >=100,000 COLONIES/mL ESCHERICHIA COLI (A)  Final   Report Status 07/04/2023 FINAL  Final   Organism ID, Bacteria ESCHERICHIA COLI (A)  Final      Susceptibility   Escherichia coli - MIC*    AMPICILLIN >=32 RESISTANT Resistant     CEFAZOLIN  16 SENSITIVE Sensitive     CEFEPIME <=0.12 SENSITIVE Sensitive     CEFTRIAXONE  1 SENSITIVE Sensitive     CIPROFLOXACIN <=0.25 SENSITIVE Sensitive     GENTAMICIN <=1 SENSITIVE Sensitive     IMIPENEM <=0.25 SENSITIVE Sensitive     NITROFURANTOIN <=16 SENSITIVE Sensitive     TRIMETH/SULFA <=20 SENSITIVE Sensitive     AMPICILLIN/SULBACTAM >=32 RESISTANT Resistant      PIP/TAZO 8 SENSITIVE Sensitive ug/mL    * >=100,000 COLONIES/mL ESCHERICHIA COLI    Radiology Studies: DG Chest Port 1 View Result Date: 07/07/2023 CLINICAL DATA:  89 year old female with shortness of breath and chest pain. EXAM: PORTABLE CHEST 1 VIEW COMPARISON:  Portable chest 07/01/2023 and earlier. FINDINGS: Portable AP semi upright view at 1010 hours. Stable cardiac size and mediastinal contours. Previous TAVR. Stable left chest pacemaker. Calcified aortic atherosclerosis. Visualized tracheal air column is within normal limits. Lung volumes are stable. Allowing for portable technique the lungs are clear. No pneumothorax. Chronic appearing proximal left humerus fracture. Negative visible bowel gas. IMPRESSION: No acute cardiopulmonary abnormality. Electronically Signed   By: VEAR Hurst M.D.   On: 07/07/2023 12:44   Scheduled Meds:  aspirin   81 mg Oral Daily   atorvastatin   40 mg Oral QPM   carvedilol   12.5 mg Oral BID WC   diclofenac  Sodium  2 g Topical QID   docusate sodium   100 mg Oral BID   escitalopram   10 mg Oral q morning   ezetimibe   10 mg Oral QPM   feeding supplement  1 Container Oral TID BM   insulin  aspart  0-9 Units Subcutaneous Q4H   isosorbide  mononitrate  30 mg Oral q morning   levothyroxine   100 mcg Oral Q0600   lidocaine   1 patch Transdermal Q24H   polyethylene glycol  17 g Oral BID   senna-docusate  1 tablet Oral BID   sodium bicarbonate   650 mg Oral Daily   And   sodium bicarbonate   1,300 mg Oral QHS   Continuous Infusions:   LOS: 6 days   Alejandro Marker, DO [facility] Available via Epic secure chat 7am-7pm After these hours, please refer to coverage provider listed on [email or web address] 07/08/2023, 7:06 PM

## 2023-07-08 NOTE — Plan of Care (Signed)
 Problem: Coping: Goal: Ability to adjust to condition or change in health will improve Outcome: Progressing   Problem: Clinical Measurements: Goal: Ability to maintain clinical measurements within normal limits will improve Outcome: Progressing   Problem: Elimination: Goal: Will not experience complications related to bowel motility Outcome: Progressing   Jon LULLA Reins, RN 07/08/23 6:39 PM

## 2023-07-08 NOTE — Progress Notes (Signed)
 Paged Anthoney Harada related to right chest pain.

## 2023-07-08 NOTE — Progress Notes (Signed)
 The patient is complaining of pain in her right chest or breast. She can not distinguish whether it is chest or breast. Informed Anthoney Harada.

## 2023-07-08 NOTE — Plan of Care (Signed)
  Problem: Coping: Goal: Ability to adjust to condition or change in health will improve Outcome: Progressing   Problem: Safety: Goal: Ability to remain free from injury will improve Outcome: Progressing   Problem: Pain Management: Goal: General experience of comfort will improve Outcome: Progressing

## 2023-07-09 ENCOUNTER — Inpatient Hospital Stay (HOSPITAL_COMMUNITY): Payer: Medicare Other

## 2023-07-09 DIAGNOSIS — N179 Acute kidney failure, unspecified: Secondary | ICD-10-CM | POA: Diagnosis not present

## 2023-07-09 DIAGNOSIS — R400 Somnolence: Secondary | ICD-10-CM

## 2023-07-09 DIAGNOSIS — S82001A Unspecified fracture of right patella, initial encounter for closed fracture: Secondary | ICD-10-CM | POA: Diagnosis not present

## 2023-07-09 DIAGNOSIS — G9341 Metabolic encephalopathy: Secondary | ICD-10-CM | POA: Diagnosis not present

## 2023-07-09 DIAGNOSIS — N184 Chronic kidney disease, stage 4 (severe): Secondary | ICD-10-CM | POA: Diagnosis not present

## 2023-07-09 LAB — COMPREHENSIVE METABOLIC PANEL
ALT: 20 U/L (ref 0–44)
AST: 73 U/L — ABNORMAL HIGH (ref 15–41)
Albumin: 3 g/dL — ABNORMAL LOW (ref 3.5–5.0)
Alkaline Phosphatase: 66 U/L (ref 38–126)
Anion gap: 11 (ref 5–15)
BUN: 80 mg/dL — ABNORMAL HIGH (ref 8–23)
CO2: 20 mmol/L — ABNORMAL LOW (ref 22–32)
Calcium: 8.7 mg/dL — ABNORMAL LOW (ref 8.9–10.3)
Chloride: 105 mmol/L (ref 98–111)
Creatinine, Ser: 3.42 mg/dL — ABNORMAL HIGH (ref 0.44–1.00)
GFR, Estimated: 12 mL/min — ABNORMAL LOW (ref >60)
Glucose, Bld: 130 mg/dL — ABNORMAL HIGH (ref 70–99)
Potassium: 4.6 mmol/L (ref 3.5–5.1)
Sodium: 136 mmol/L (ref 135–145)
Total Bilirubin: 0.7 mg/dL (ref 0.0–1.2)
Total Protein: 6.4 g/dL — ABNORMAL LOW (ref 6.5–8.1)

## 2023-07-09 LAB — BLOOD GAS, ARTERIAL
Acid-base deficit: 3.8 mmol/L — ABNORMAL HIGH (ref 0.0–2.0)
Bicarbonate: 19.7 mmol/L — ABNORMAL LOW (ref 20.0–28.0)
Drawn by: 20012
FIO2: 21 %
O2 Saturation: 97.9 %
Patient temperature: 37.1
pCO2 arterial: 29 mm[Hg] — ABNORMAL LOW (ref 32–48)
pH, Arterial: 7.44 (ref 7.35–7.45)
pO2, Arterial: 94 mm[Hg] (ref 83–108)

## 2023-07-09 LAB — GLUCOSE, CAPILLARY
Glucose-Capillary: 122 mg/dL — ABNORMAL HIGH (ref 70–99)
Glucose-Capillary: 132 mg/dL — ABNORMAL HIGH (ref 70–99)
Glucose-Capillary: 146 mg/dL — ABNORMAL HIGH (ref 70–99)
Glucose-Capillary: 209 mg/dL — ABNORMAL HIGH (ref 70–99)
Glucose-Capillary: 192 mg/dL — ABNORMAL HIGH (ref 70–99)
Glucose-Capillary: 236 mg/dL — ABNORMAL HIGH (ref 70–99)

## 2023-07-09 LAB — URINALYSIS, ROUTINE W REFLEX MICROSCOPIC
Bilirubin Urine: NEGATIVE
Glucose, UA: NEGATIVE mg/dL
Ketones, ur: NEGATIVE mg/dL
Nitrite: NEGATIVE
Protein, ur: 30 mg/dL — AB
Specific Gravity, Urine: 1.011 (ref 1.005–1.030)
pH: 5 (ref 5.0–8.0)

## 2023-07-09 LAB — PHOSPHORUS: Phosphorus: 4.9 mg/dL — ABNORMAL HIGH (ref 2.5–4.6)

## 2023-07-09 LAB — CBC WITH DIFFERENTIAL/PLATELET
Abs Immature Granulocytes: 0.07 10*3/uL (ref 0.00–0.07)
Basophils Absolute: 0 10*3/uL (ref 0.0–0.1)
Basophils Relative: 0 %
Eosinophils Absolute: 0.2 10*3/uL (ref 0.0–0.5)
Eosinophils Relative: 2 %
HCT: 26 % — ABNORMAL LOW (ref 36.0–46.0)
Hemoglobin: 8.3 g/dL — ABNORMAL LOW (ref 12.0–15.0)
Immature Granulocytes: 1 %
Lymphocytes Relative: 11 %
Lymphs Abs: 1.4 10*3/uL (ref 0.7–4.0)
MCH: 28.5 pg (ref 26.0–34.0)
MCHC: 31.9 g/dL (ref 30.0–36.0)
MCV: 89.3 fL (ref 80.0–100.0)
Monocytes Absolute: 1.5 10*3/uL — ABNORMAL HIGH (ref 0.1–1.0)
Monocytes Relative: 12 %
Neutro Abs: 9.7 10*3/uL — ABNORMAL HIGH (ref 1.7–7.7)
Neutrophils Relative %: 74 %
Platelets: 165 10*3/uL (ref 150–400)
RBC: 2.91 MIL/uL — ABNORMAL LOW (ref 3.87–5.11)
RDW: 14.8 % (ref 11.5–15.5)
WBC: 13 10*3/uL — ABNORMAL HIGH (ref 4.0–10.5)
nRBC: 0 % (ref 0.0–0.2)

## 2023-07-09 LAB — MAGNESIUM: Magnesium: 1.9 mg/dL (ref 1.7–2.4)

## 2023-07-09 LAB — AMMONIA: Ammonia: <10 umol/L (ref 9–35)

## 2023-07-09 LAB — TSH: TSH: 20.957 u[IU]/mL — ABNORMAL HIGH (ref 0.350–4.500)

## 2023-07-09 MED ORDER — SODIUM CHLORIDE 0.9 % IV SOLN
1.0000 g | INTRAVENOUS | Status: DC
Start: 2023-07-09 — End: 2023-07-13
  Administered 2023-07-09 – 2023-07-12 (×4): 1 g via INTRAVENOUS
  Filled 2023-07-09 (×7): qty 10

## 2023-07-09 MED ORDER — NALOXONE HCL 0.4 MG/ML IJ SOLN: 0.4000 mg | INTRAMUSCULAR | Status: DC | PRN

## 2023-07-09 MED ORDER — AZITHROMYCIN 250 MG PO TABS: 500.0000 mg | ORAL_TABLET | Freq: Every day | ORAL | Status: DC

## 2023-07-09 MED ORDER — DOXYCYCLINE HYCLATE 100 MG PO TABS
100.0000 mg | ORAL_TABLET | Freq: Two times a day (BID) | ORAL | Status: DC
  Administered 2023-07-09 – 2023-07-13 (×8): 100 mg via ORAL
  Filled 2023-07-09 (×8): qty 1

## 2023-07-09 MED ORDER — GUAIFENESIN ER 600 MG PO TB12
1200.0000 mg | ORAL_TABLET | Freq: Two times a day (BID) | ORAL | Status: DC
Start: 2023-07-09 — End: 2023-07-13
  Administered 2023-07-09 – 2023-07-13 (×9): 1200 mg via ORAL
  Filled 2023-07-09 (×9): qty 2

## 2023-07-09 NOTE — Progress Notes (Signed)
 PROGRESS NOTE    Molly Cobb  FMW:[id number] DOB: [birth year] DOA: 07/01/2023 PCP: Fredricka Richerd Ee, MD   Brief Narrative:  The patient is an elderly 89 year old AAF with history of CKD stage IV, CAD, depression, DM2, gout, HTN, hypothyroidism, severe AAS status post TAVR, history of pacemaker, chronic anemia presented to the ED from [facility] for evaluation of right knee and facial pain after a fall.  Per family patient has been more confused over the past few days. Chest x-ray, x-ray pelvis, CT head, cervical spine, CT maxillofacial did not show any fracture or acute trauma.  There was small laceration of the skin noted over the right chin.  X-ray of the right knee showed mild patellar fracture.  Dr. Vernetta from orthopedic was consulted and patient was placed in knee immobilizer and the recommendation is for her to have her knee stay fully extended for the next 6 weeks to allow the fracture to heal with weightbearing as tolerated knee brace and outpatient follow-up in 1 to 2 weeks for repeat knee x-rays. PT/OT is recommending SNF, awaiting placement at this time.  Assessment & Plan:  Principal Problem:   Patella fracture Active Problems:   Hypothyroidism (acquired)   Type 2 diabetes mellitus with stage 4 chronic kidney disease, without long-term current use of insulin  (HCC)   CKD (chronic kidney disease) stage 4, GFR 15-29 ml/min (HCC)   UTI (urinary tract infection)   Acute metabolic encephalopathy   AKI (acute kidney injury) (HCC)   Metabolic acidosis   Hyperkalemia   Mild Right Patellar Fracture Mechanical fall -Secondary to a mechanical fall.  Orthopedics, Dr. Vernetta, consulted. -Recommend Knee immobilizer for 6 weeks -PT/OT evaluated and SNF. -B12, vitamin D  normal.  Constipation -Bowel regimen initiated   Urinary Tract Infection -Completed 5 days of antibiotics on 1/7 but restarted as below for Infiltrate noted on CXR  Atypical Right-sided Chest  Pain -Musculoskeletal in nature secondary to her fall.  Initial x-ray was negative, will repeat X-Ray done yesterday -Add Lidocaine  Patch and Diclofenac  Gel   Acute Metabolic Encephalopathy and Somnolence and Drowsiness -On admission was Possibly related to UTI.  On 07/09/23 she was very Somnolent and Drowasy  -Per ED report, family was concerned that patient has been more confused over the past few days but over the last few days she was A and O x4; This AM she was extremely somnolent and drowsy -Check ABG and she was not hypercarbic and Ammonia Level normal -Hold Narcotics -She subsequently woke up later in the day -CT head showing no acute intracranial abnormality and no obvious focal neurodeficit on exam.   CKD stage V Mild metabolic acidosis, mild hyperkalemia; chronic Uremia -Hyperkalemia resolved and metabolic acidosis stabilizing.  -Baseline creatinine around 3.8-4.5.  BUN is also elevated.  Gentle hydration as needed -BUN/Cr Trend: Recent Labs  Lab 07/03/23 0415 07/04/23 0351 07/05/23 0342 07/06/23 0335 07/07/23 0318 07/08/23 0337 07/09/23 0425  BUN 63* 70* 72* 72* 77* 81* 80*  CREATININE 3.76* 4.07* 4.05* 3.45* 4.25* 3.65* 3.42*  -K+ Trend: Recent Labs  Lab 07/03/23 0415 07/04/23 0351 07/05/23 0342 07/06/23 0335 07/07/23 0318 07/08/23 0337 07/09/23 0425  K 4.9 4.6 4.0 4.1 4.3 4.2 4.6  -Patient's Metabolic Acidosis showed a CO2 of 20, anion gap of 11, chloride level of 105 -Avoid Nephrotoxic Medications, Contrast Dyes, Hypotension and Dehydration to Ensure Adequate Renal Perfusion and will need to Renally Adjust Meds -Continue to Monitor and Trend Renal Function carefully and repeat CMP in the AM  -  Continue outpatient sodium bicarb -[facility]   Right hand pain -C/w Supportive care, x-rays negative   Abnormal EKG History of CAD Severe AS s/p TAVR -EKG showing new T wave inversions inferolaterally.  Troponins overall remains  flat -Has Right sided CP -Initial CXR as above and showed No acute cardiopulmonary abnormality. But repeat a below -Continue to Monitor for CP   Depression -Continue Escitalopram  10 mg p every Morning .  Hyponatremia -Na+ Trend: Recent Labs  Lab 07/03/23 0415 07/04/23 0351 07/05/23 0342 07/06/23 0335 07/07/23 0318 07/08/23 0337 07/09/23 0425  NA 136 134* 134* 131* 133* 133* 136  -Continue to Monitor and Trend and repeat CMP in the AM   Type 2 Diabetes Mellitus  -On Sensitive sliding scale and Accu-Cheks -CBG Trend: Recent Labs  Lab 07/08/23 1724 07/08/23 2028 07/08/23 2338 07/09/23 0424 07/09/23 0730 07/09/23 1109 07/09/23 1521  GLUCAP 213* 241* 127* 122* 132* 146* 209*   Hypertension -Continue Carvedilol  12.5 mg po BID and and Isosorbide  Mononitrate 30 mg po Daily.  -C/w IV Hydralazine  10 mg q4hprn SBP >180  Leukocytosis, worsened -WBC Trend: Recent Labs  Lab 07/03/23 0415 07/04/23 0351 07/05/23 0342 07/06/23 0335 07/07/23 0318 07/08/23 0337 07/09/23 0425  WBC 10.7* 10.7* 10.0 8.8 9.5 9.6 13.0*  -Repeat Blood Cx x2 -Repeat CXR done and showed Left-sided implanted cardiac device remains in place. The heart size and mediastinal contours are stable. Slightly increasing interstitial opacity at the right lung base. No pleural effusion or pneumothorax. The visualized skeletal structures are unremarkable. -Will restart IV Abx with po Azithromycin  and IV Ceftriaxone  given Infiltrate on CXR -Check SLP -Repeat U/A done showed moderate hemoglobin, small leukocytes, negative nitrites, 30 protein, rare bacteria, 0-5 RBCs per high-power field, 0-5 squamous of the cells and 6-10 WBCs with urine culture pending -Continue to Monitor and repeat CXR in the AM    Hyperlipidemia -Continue Atorvastatin  40 mg po Daily and Ezetimibe  10 mg po every Evening but may need to hold given mildly elevated AST  Elevated AST -Mild. AST trend: Recent Labs  Lab 06/17/23 0158  07/09/23 0425  AST 32 73*  -Continue to Monitor and Trend and repeat CMP in the AM  . Hypothyroidism -Elevated TSH on admission of 38.309, normal free T4 of 0.89.  Increased Levothyroxine  to 100 mcg. -Repeat TSH is now 20.957 -Repeat TFTs outpatient in 4 weeks   Chronic Normocytic Anemia -Hgb/Hct Trend: Recent Labs  Lab 07/03/23 0415 07/04/23 0351 07/05/23 0342 07/06/23 0335 07/07/23 0318 07/08/23 0337 07/09/23 0425  HGB 9.8* 8.8* 8.6* 8.6* 8.0* 8.3* 8.3*  HCT 31.1* 28.4* 26.8* 27.2* 25.4* 26.8* 26.0*  MCV 91.5 92.8 91.2 90.4 90.4 90.8 89.3  -Continue to Monitor and Trend and repeat CBC in the AM   Hypoalbuminemia -Patient's Albumin  Trend: Recent Labs  Lab 06/17/23 0158 07/09/23 0425  ALBUMIN  3.1* 3.0*  -Continue to Monitor and Trend and repeat CMP in the AM  Class I Obesity -Complicates overall prognosis and care -Estimated body mass index is 30.11 kg/m as calculated from the following:   Height as of this encounter: 5' 3 (1.6 m).   Weight as of this encounter: 77.1 kg.  -Weight Loss and Dietary Counseling given   PT/OT-SNF -TOC consulted and awaiting for Insurance Authorization    DVT prophylaxis: SCDs Start: 07/02/23 0430    Code Status: Full Code Family Communication: No family present at bedside  Disposition Plan:  Level of care: Med-Surg Status is: Inpatient Remains inpatient appropriate because:  Needs further clinical improvement and Insurance Authorization for SNF   Consultants:  Orthopedic Surgery  Procedures:  As delineated as above   Antimicrobials:  Anti-infectives (From admission, onward)    Start     Dose/Rate Route Frequency Ordered Stop   07/09/23 1915  cefTRIAXone  (ROCEPHIN ) 1 g in sodium chloride  0.9 % 100 mL IVPB        1 g 200 mL/hr over 30 Minutes Intravenous Every 24 hours 07/09/23 1819     07/09/23 1915  azithromycin  (ZITHROMAX ) tablet 500 mg        500 mg Oral Daily 07/09/23 1819     07/04/23 2200  ceFAZolin  (ANCEF ) IVPB  1 g/50 mL premix  Status:  Discontinued        1 g 100 mL/hr over 30 Minutes Intravenous Every 24 hours 07/04/23 1127 07/06/23 0804   07/02/23 2100  cefTRIAXone  (ROCEPHIN ) 1 g in sodium chloride  0.9 % 100 mL IVPB  Status:  Discontinued        1 g 200 mL/hr over 30 Minutes Intravenous Every 24 hours 07/02/23 0432 07/04/23 1126   07/01/23 2145  cefTRIAXone  (ROCEPHIN ) 1 g in sodium chloride  0.9 % 100 mL IVPB        1 g 200 mL/hr over 30 Minutes Intravenous  Once 07/01/23 2141 07/02/23 1522       Subjective: Seen and examined at bedside and was somnolent and drowsy this morning but arousable.  Nursing states that she was very lethargic in the morning.  Narcotics have been discontinued.  Renal function slowly improving.  ABG was unremarkable.  She woke up subsequently in the afternoon but was short of breath so chest x-ray was done and showing a possible pneumonia.  She denied any lightheadedness or dizziness and felt okay.  Objective: Vitals:   07/09/23 0434 07/09/23 0516 07/09/23 0921 07/09/23 1253  BP: (!) 112/56 134/78 (!) 146/62 (!) 118/49  Pulse: 69 (!) 101 70 73  Resp: 18 18 16 16   Temp: 98.6 F (37 C) 98.7 F (37.1 C) (!) 97.1 F (36.2 C) 98.6 F (37 C)  TempSrc: Oral Oral Oral Oral  SpO2: 100% 94% 100% 100%  Weight:      Height:        Intake/Output Summary (Last 24 hours) at 07/09/2023 1827 Last data filed at 07/09/2023 1200 Gross per 24 hour  Intake 420 ml  Output 1250 ml  Net -830 ml   Filed Weights   07/03/23 2300  Weight: 77.1 kg   Examination: Physical Exam:  Constitutional: Elderly obese African-American female who is lethargic and somnolent and drowsy Respiratory: Diminished to auscultation bilaterally, no wheezing, rales, rhonchi or crackles. Normal respiratory effort and patient is not tachypenic. No accessory muscle use.  Wearing no supplemental oxygen Cardiovascular: RRR, no murmurs / rubs / gallops. S1 and S2 auscultated. No extremity edema. 2+ pedal  pulses. No carotid bruits.  Abdomen: Soft, non-tender, non-distended. No masses palpated. No appreciable hepatosplenomegaly. Bowel sounds positive.  GU: Deferred. Musculoskeletal: No clubbing / cyanosis of digits/nails. No joint deformity upper and lower extremities.  Skin: No rashes, lesions, ulcers limited skin evaluation. No induration; Warm and dry.  Neurologic: Somnolent and drowsy but easily arousable and follow commands Psychiatric: Mildly impaired judgment and insight. Somnolent and drowsy but when she is fully awoken she is oriented  Data Reviewed: I have personally reviewed following labs and imaging studies  CBC: Recent Labs  Lab 07/05/23 0342 07/06/23 0335 07/07/23 9681 07/08/23 9662 07/09/23 0425  WBC 10.0 8.8 9.5 9.6 13.0*  NEUTROABS  --   --   --   --  9.7*  HGB 8.6* 8.6* 8.0* 8.3* 8.3*  HCT 26.8* 27.2* 25.4* 26.8* 26.0*  MCV 91.2 90.4 90.4 90.8 89.3  PLT 141* 141* 130* 155 165   Basic Metabolic Panel: Recent Labs  Lab 07/03/23 0415 07/04/23 0351 07/05/23 0342 07/06/23 0335 07/07/23 0318 07/08/23 0337 07/09/23 0425  NA 136   < > 134* 131* 133* 133* 136  K 4.9   < > 4.0 4.1 4.3 4.2 4.6  CL 107   < > 106 102 102 104 105  CO2 19*   < > 19* 18* 21* 20* 20*  GLUCOSE 146*   < > 72 126* 203* 159* 130*  BUN 63*   < > 72* 72* 77* 81* 80*  CREATININE 3.76*   < > 4.05* 3.45* 4.25* 3.65* 3.42*  CALCIUM  8.8*   < > 8.3* 8.0* 7.9* 8.2* 8.7*  MG 2.1   < > 2.1 2.1 1.8 2.0 1.9  PHOS 4.0  --   --   --   --   --  4.9*   < > = values in this interval not displayed.   GFR: Estimated Creatinine Clearance: 11 mL/min (A) (by C-G formula based on SCr of 3.42 mg/dL (H)). Liver Function Tests: Recent Labs  Lab 07/09/23 0425  AST 73*  ALT 20  ALKPHOS 66  BILITOT 0.7  PROT 6.4*  ALBUMIN  3.0*   No results for input(s): LIPASE, AMYLASE in the last 168 hours. Recent Labs  Lab 07/09/23 1229  AMMONIA <10   Coagulation Profile: No results for input(s): INR,  PROTIME in the last 168 hours. Cardiac Enzymes: No results for input(s): CKTOTAL, CKMB, CKMBINDEX, TROPONINI in the last 168 hours. BNP (last 3 results) No results for input(s): PROBNP in the last 8760 hours. HbA1C: No results for input(s): HGBA1C in the last 72 hours. CBG: Recent Labs  Lab 07/08/23 2338 07/09/23 0424 07/09/23 0730 07/09/23 1109 07/09/23 1521  GLUCAP 127* 122* 132* 146* 209*   Lipid Profile: No results for input(s): CHOL, HDL, LDLCALC, TRIG, CHOLHDL, LDLDIRECT in the last 72 hours. Thyroid  Function Tests: Recent Labs    07/09/23 1236  TSH 20.957*   Anemia Panel: No results for input(s): VITAMINB12, FOLATE, FERRITIN, TIBC, IRON, RETICCTPCT in the last 72 hours. Sepsis Labs: No results for input(s): PROCALCITON, LATICACIDVEN in the last 168 hours.  Recent Results (from the past 240 hours)  Urine Culture     Status: Abnormal   Collection Time: 07/01/23  9:42 PM   Specimen: Urine, Clean Catch  Result Value Ref Range Status   Specimen Description   Final    URINE, CLEAN CATCH Performed at [facility], [address], [place], [place] [address]    Special Requests   Final    NONE Performed at [facility], [address] [place], [place] [address]    Culture >=100,000 COLONIES/mL ESCHERICHIA COLI (A)  Final   Report Status 07/04/2023 FINAL  Final   Organism ID, Bacteria ESCHERICHIA COLI (A)  Final      Susceptibility   Escherichia coli - MIC*    AMPICILLIN >=32 RESISTANT Resistant     CEFAZOLIN  16 SENSITIVE Sensitive     CEFEPIME <=0.12 SENSITIVE Sensitive     CEFTRIAXONE  1 SENSITIVE Sensitive     CIPROFLOXACIN <=0.25 SENSITIVE Sensitive     GENTAMICIN <=1 SENSITIVE Sensitive  IMIPENEM <=0.25 SENSITIVE Sensitive     NITROFURANTOIN <=16 SENSITIVE Sensitive     TRIMETH/SULFA <=20 SENSITIVE Sensitive     AMPICILLIN/SULBACTAM >=32 RESISTANT Resistant     PIP/TAZO 8  SENSITIVE Sensitive ug/mL    * >=100,000 COLONIES/mL ESCHERICHIA COLI  Culture, blood (Routine X 2) w Reflex to ID Panel     Status: None (Preliminary result)   Collection Time: 07/09/23 12:29 PM   Specimen: BLOOD RIGHT ARM  Result Value Ref Range Status   Specimen Description BLOOD RIGHT ARM  Final   Special Requests   Final    BOTTLES DRAWN AEROBIC ONLY Blood Culture results may not be optimal due to an inadequate volume of blood received in culture bottles Performed at [facility], [address] [place], [place] [address]    Culture PENDING  Incomplete   Report Status PENDING  Incomplete  Culture, blood (Routine X 2) w Reflex to ID Panel     Status: None (Preliminary result)   Collection Time: 07/09/23 12:36 PM   Specimen: BLOOD RIGHT ARM  Result Value Ref Range Status   Specimen Description BLOOD RIGHT ARM  Final   Special Requests   Final    BOTTLES DRAWN AEROBIC ONLY Blood Culture results may not be optimal due to an inadequate volume of blood received in culture bottles Performed at [facility], [address] [place], [place] [address]    Culture PENDING  Incomplete   Report Status PENDING  Incomplete    Radiology Studies: DG CHEST PORT 1 VIEW Result Date: 07/09/2023 CLINICAL DATA:  Shortness of breath EXAM: PORTABLE CHEST 1 VIEW COMPARISON:  07/07/2023 FINDINGS: Left-sided implanted cardiac device remains in place. The heart size and mediastinal contours are stable. Slightly increasing interstitial opacity at the right lung base. No pleural effusion or pneumothorax. The visualized skeletal structures are unremarkable. IMPRESSION: Slightly increasing interstitial opacity at the right lung base, which may represent atelectasis versus developing infiltrate. Electronically Signed   By: Mabel Converse D.O.   On: 07/09/2023 16:45   CT HEAD WO CONTRAST ( ) Result Date: 07/09/2023 CLINICAL DATA:  Mental status change, unknown cause. EXAM: CT HEAD WITHOUT CONTRAST  TECHNIQUE: Contiguous axial images were obtained from the base of the skull through the vertex without intravenous contrast. RADIATION DOSE REDUCTION: This exam was performed according to the departmental dose-optimization program which includes automated exposure control, adjustment of the mA and/or kV according to patient size and/or use of iterative reconstruction technique. COMPARISON:  CT head without contrast 07/01/2023 FINDINGS: Brain: Moderate atrophy and white matter disease is similar the prior study. No acute infarct, hemorrhage, or mass lesion is present. The ventricles are of normal size. No significant extraaxial fluid collection is present. The brainstem and cerebellum are within normal limits. A relatively empty sella is present. Vascular: Atherosclerotic calcifications are present within the cavernous internal carotid arteries bilaterally and at the dural margin of both vertebral arteries. No hyperdense vessel is present. Skull: Calvarium is intact. No focal lytic or blastic lesions are present. No significant extracranial soft tissue lesion is present. Sinuses/Orbits: The paranasal sinuses and mastoid air cells are clear. Bilateral lens replacements are noted. Globes and orbits are otherwise unremarkable. IMPRESSION: 1. No acute intracranial abnormality or significant interval change. 2. Moderate atrophy and white matter disease is similar the prior study and likely reflects the sequela of chronic microvascular ischemia. Electronically Signed   By: Lonni Necessary M.D.   On: 07/09/2023 10:31   Scheduled Meds:  aspirin   81 mg Oral Daily   atorvastatin   40 mg Oral QPM   azithromycin   500 mg Oral Daily   carvedilol   12.5 mg Oral BID WC   diclofenac  Sodium  2 g Topical QID   docusate sodium   100 mg Oral BID   escitalopram   10 mg Oral q morning   ezetimibe   10 mg Oral QPM   feeding supplement  1 Container Oral TID BM   guaiFENesin   1,200 mg Oral BID   insulin  aspart  0-9 Units  Subcutaneous Q4H   isosorbide  mononitrate  30 mg Oral q morning   levothyroxine   100 mcg Oral Q0600   lidocaine   1 patch Transdermal Q24H   polyethylene glycol  17 g Oral BID   senna-docusate  1 tablet Oral BID   sodium bicarbonate   650 mg Oral Daily   And   sodium bicarbonate   1,300 mg Oral QHS   Continuous Infusions:  cefTRIAXone  (ROCEPHIN )  IV      LOS: 7 days   Alejandro Marker, DO [facility] Available via Epic secure chat 7am-7pm After these hours, please refer to coverage provider listed on [email or web address] 07/09/2023, 6:27 PM

## 2023-07-09 NOTE — TOC Progression Note (Signed)
 Transition of Care [facility]) - Progression Note    Patient Details  Name: Molly Cobb MRN: [id number] Date of Birth: 12/27/1931  Transition of [facility]) CM/SW Contact  [name] [name] [name], LCSW Phone Number: 07/09/2023, 12:02 PM  Clinical Narrative:     Pt's insurance shara is still pending for SNF placement. TOC to follow.     Barriers to Discharge: English As A Second Language Teacher, SNF Pending bed offer  Expected Discharge Plan and Services In-house Referral: Clinical Social Work     Living arrangements for the past 2 months: Independent Living Facility                                       Social Determinants of Health (SDOH) Interventions SDOH Screenings   Food Insecurity: No Food Insecurity (07/02/2023)  Housing: Low Risk  (07/02/2023)  Transportation Needs: No Transportation Needs (07/02/2023)  Utilities: Not At Risk (07/02/2023)  Financial Resource Strain: Low Risk  (01/11/2022)   [facility] prior to 08/28/2022[facility] visits prior to 08/28/2022.  Physical Activity: Unknown (01/11/2022)   Received from [facility], [facility] prior to 08/28/2022., [facility] visits prior to 08/28/2022., [facility], [facility] visits prior to 08/28/2022.  Social Connections: Unknown (07/02/2023)  Stress: Stress Concern Present (01/11/2022)   [facility] prior to 08/28/2022., [facility] visits prior to 08/28/2022.  Tobacco Use: Low Risk  (07/02/2023)    Readmission Risk Interventions    07/05/2023    2:24 PM 09/24/2021   11:03 AM  Readmission Risk Prevention Plan  Transportation Screening Complete Complete  PCP or Specialist Appt within 3-5 Days Complete Complete  HRI or Home Care Consult Complete Complete  Social Work Consult for  Recovery Care Planning/Counseling Complete Complete  Palliative Care Screening Not Applicable Not Applicable  Medication Review Oceanographer) Complete Complete

## 2023-07-09 NOTE — Plan of Care (Signed)
  Problem: Coping: Goal: Ability to adjust to condition or change in health will improve Outcome: Progressing   Problem: Safety: Goal: Ability to remain free from injury will improve Outcome: Progressing   Problem: Pain Management: Goal: General experience of comfort will improve Outcome: Progressing

## 2023-07-10 ENCOUNTER — Inpatient Hospital Stay (HOSPITAL_COMMUNITY): Payer: Medicare Other

## 2023-07-10 DIAGNOSIS — N179 Acute kidney failure, unspecified: Secondary | ICD-10-CM | POA: Diagnosis not present

## 2023-07-10 DIAGNOSIS — G9341 Metabolic encephalopathy: Secondary | ICD-10-CM | POA: Diagnosis not present

## 2023-07-10 DIAGNOSIS — S82001A Unspecified fracture of right patella, initial encounter for closed fracture: Secondary | ICD-10-CM | POA: Diagnosis not present

## 2023-07-10 DIAGNOSIS — N184 Chronic kidney disease, stage 4 (severe): Secondary | ICD-10-CM | POA: Diagnosis not present

## 2023-07-10 LAB — CBC WITH DIFFERENTIAL/PLATELET
Abs Immature Granulocytes: 0.06 10*3/uL (ref 0.00–0.07)
Basophils Absolute: 0 10*3/uL (ref 0.0–0.1)
Basophils Relative: 0 %
Eosinophils Absolute: 0.2 10*3/uL (ref 0.0–0.5)
Eosinophils Relative: 2 %
HCT: 26.8 % — ABNORMAL LOW (ref 36.0–46.0)
Hemoglobin: 8.4 g/dL — ABNORMAL LOW (ref 12.0–15.0)
Immature Granulocytes: 1 %
Lymphocytes Relative: 13 %
Lymphs Abs: 1.3 10*3/uL (ref 0.7–4.0)
MCH: 28.3 pg (ref 26.0–34.0)
MCHC: 31.3 g/dL (ref 30.0–36.0)
MCV: 90.2 fL (ref 80.0–100.0)
Monocytes Absolute: 1.3 10*3/uL — ABNORMAL HIGH (ref 0.1–1.0)
Monocytes Relative: 13 %
Neutro Abs: 7.1 10*3/uL (ref 1.7–7.7)
Neutrophils Relative %: 71 %
Platelets: 164 10*3/uL (ref 150–400)
RBC: 2.97 MIL/uL — ABNORMAL LOW (ref 3.87–5.11)
RDW: 14.9 % (ref 11.5–15.5)
WBC: 10 10*3/uL (ref 4.0–10.5)
nRBC: 0 % (ref 0.0–0.2)

## 2023-07-10 LAB — COMPREHENSIVE METABOLIC PANEL
ALT: 23 U/L (ref 0–44)
AST: 72 U/L — ABNORMAL HIGH (ref 15–41)
Albumin: 2.9 g/dL — ABNORMAL LOW (ref 3.5–5.0)
Alkaline Phosphatase: 64 U/L (ref 38–126)
Anion gap: 10 (ref 5–15)
BUN: 81 mg/dL — ABNORMAL HIGH (ref 8–23)
CO2: 17 mmol/L — ABNORMAL LOW (ref 22–32)
Calcium: 8.3 mg/dL — ABNORMAL LOW (ref 8.9–10.3)
Chloride: 107 mmol/L (ref 98–111)
Creatinine, Ser: 3.06 mg/dL — ABNORMAL HIGH (ref 0.44–1.00)
GFR, Estimated: 14 mL/min — ABNORMAL LOW (ref >60)
Glucose, Bld: 135 mg/dL — ABNORMAL HIGH (ref 70–99)
Potassium: 4.5 mmol/L (ref 3.5–5.1)
Sodium: 134 mmol/L — ABNORMAL LOW (ref 135–145)
Total Bilirubin: 0.8 mg/dL (ref 0.0–1.2)
Total Protein: 6.6 g/dL (ref 6.5–8.1)

## 2023-07-10 LAB — GLUCOSE, CAPILLARY
Glucose-Capillary: 124 mg/dL — ABNORMAL HIGH (ref 70–99)
Glucose-Capillary: 124 mg/dL — ABNORMAL HIGH (ref 70–99)
Glucose-Capillary: 115 mg/dL — ABNORMAL HIGH (ref 70–99)
Glucose-Capillary: 133 mg/dL — ABNORMAL HIGH (ref 70–99)
Glucose-Capillary: 145 mg/dL — ABNORMAL HIGH (ref 70–99)
Glucose-Capillary: 117 mg/dL — ABNORMAL HIGH (ref 70–99)

## 2023-07-10 LAB — PHOSPHORUS: Phosphorus: 4.7 mg/dL — ABNORMAL HIGH (ref 2.5–4.6)

## 2023-07-10 LAB — RPR: RPR Ser Ql: NONREACTIVE

## 2023-07-10 LAB — MAGNESIUM: Magnesium: 2 mg/dL (ref 1.7–2.4)

## 2023-07-10 MED ORDER — KCL IN DEXTROSE-NACL 20-5-0.9 MEQ/L-%-% IV SOLN
INTRAVENOUS | Status: AC
  Filled 2023-07-10 (×2): qty 1000

## 2023-07-10 MED ORDER — SODIUM BICARBONATE 650 MG PO TABS: 1300.0000 mg | ORAL_TABLET | Freq: Every day | ORAL | Status: DC

## 2023-07-10 MED ORDER — SODIUM BICARBONATE 650 MG PO TABS
650.0000 mg | ORAL_TABLET | Freq: Every day | ORAL | Status: DC
Start: 2023-07-10 — End: 2023-07-13
  Administered 2023-07-11 – 2023-07-12 (×3): 650 mg via ORAL
  Filled 2023-07-10 (×3): qty 1

## 2023-07-10 MED ORDER — SODIUM BICARBONATE 650 MG PO TABS
1300.0000 mg | ORAL_TABLET | Freq: Two times a day (BID) | ORAL | Status: DC
  Administered 2023-07-10 – 2023-07-13 (×6): 1300 mg via ORAL
  Filled 2023-07-10 (×6): qty 2

## 2023-07-10 MED ORDER — ORAL CARE MOUTH RINSE
15.0000 mL | OROMUCOSAL | Status: DC
  Administered 2023-07-11 – 2023-07-13 (×9): 15 mL via OROMUCOSAL

## 2023-07-10 MED ORDER — ORAL CARE MOUTH RINSE: 15.0000 mL | OROMUCOSAL | Status: DC | PRN

## 2023-07-10 NOTE — Progress Notes (Signed)
 PROGRESS NOTE    Molly Cobb  FMW:[id number] DOB: [birth year] DOA: 07/01/2023 PCP: Fredricka Richerd Ee, MD   Brief Narrative:  The patient is an elderly 89 year old AAF with history of CKD stage IV, CAD, depression, DM2, gout, HTN, hypothyroidism, severe AAS status post TAVR, history of pacemaker, chronic anemia presented to the ED from [facility] for evaluation of right knee and facial pain after a fall.  Per family patient has been more confused over the past few days. Chest x-ray, x-ray pelvis, CT head, cervical spine, CT maxillofacial did not show any fracture or acute trauma.  There was small laceration of the skin noted over the right chin.  X-ray of the right knee showed mild patellar fracture.  Dr. Vernetta from orthopedic was consulted and patient was placed in knee immobilizer and the recommendation is for her to have her knee stay fully extended for the next 6 weeks to allow the fracture to heal with weightbearing as tolerated knee brace and outpatient follow-up in 1 to 2 weeks for repeat knee x-rays. PT/OT is recommending SNF, awaiting placement at this time but in the time that she has been waiting for placement to SNF she became encephalopathic and very somnolent and drowsy the last few days so she was transferred to the progressive care unit for closer observation and will obtain SLP evaluation and restart antibiotics for possible aspiration and pneumonia.  We will also get an EEG for further evaluation.  Assessment & Plan:  Principal Problem:   Patella fracture Active Problems:   Hypothyroidism (acquired)   Type 2 diabetes mellitus with stage 4 chronic kidney disease, without long-term current use of insulin  (HCC)   CKD (chronic kidney disease) stage 4, GFR 15-29 ml/min (HCC)   UTI (urinary tract infection)   Acute metabolic encephalopathy   AKI (acute kidney injury) (HCC)   Metabolic acidosis   Hyperkalemia   Mild Right Patellar Fracture Mechanical fall -Secondary to a  mechanical fall.  Orthopedics, Dr. Vernetta, consulted. -Recommend Knee immobilizer for 6 weeks -PT/OT evaluated and SNF once she is stable for discharge. -B12, vitamin D  normal.  Constipation -Bowel regimen initiated and will continue   Urinary Tract Infection -Completed 5 days of antibiotics on 1/7 but restarted as below for Infiltrate noted on CXR  Atypical Right-sided Chest Pain -Musculoskeletal in nature secondary to her fall.  Initial x-ray was negative, will repeat X-Ray done yesterday -Add Lidocaine  Patch and Diclofenac  Gel   Acute Metabolic Encephalopathy and Somnolence and Drowsiness -On admission was Possibly related to UTI.  On 07/09/23 she was very Somnolent and Drowsy but subsequently woke up in the afternoon; today she is getting very sleepy and somnolent drowsy and difficult to arouse -Per ED report, family was concerned that patient has been more confused over the past few days but over the last few days she was A and O x4; This AM she was extremely somnolent and drowsy -Check ABG and she was not hypercarbic and Ammonia Level normal -Hold Narcotics -Workup as above and because she was still somnolent we transferred her to the progressive care unit and will obtain EEG -She subsequently woke up later in the day -CT head showing no acute intracranial abnormality and no obvious focal neurodeficit on exam.   CKD stage V Mild Metabolic Acidosis, mild hyperkalemia; chronic Uremia -Hyperkalemia resolved and metabolic acidosis stabilizing.  -Baseline creatinine around 3.8-4.5.  BUN is also elevated.  Gentle hydration as needed -BUN/Cr Trend: Recent Labs  Lab 07/04/23 0351 07/05/23 0342 07/06/23  9664 07/07/23 0318 07/08/23 0337 07/09/23 0425 07/10/23 0358  BUN 70* 72* 72* 77* 81* 80* 81*  CREATININE 4.07* 4.05* 3.45* 4.25* 3.65* 3.42* 3.06*  -K+ Trend: Recent Labs  Lab 07/04/23 0351 07/05/23 0342 07/06/23 0335 07/07/23 0318 07/08/23 0337 07/09/23 0425  07/10/23 0358  K 4.6 4.0 4.1 4.3 4.2 4.6 4.5  -Patient's Metabolic Acidosis shows now a CO2 of 17, anion gap of 10, chloride level 107; increased sodium bicarbonate  to 1300 p.o. twice daily and 650 mg p.o. nightly -Avoid Nephrotoxic Medications, Contrast Dyes, Hypotension and Dehydration to Ensure Adequate Renal Perfusion and will need to Renally Adjust Meds -Continue to Monitor and Trend Renal Function carefully and repeat CMP in the AM  -Continue outpatient sodium bicarb -Follows with Nephrology at [facility]   Right hand pain -C/w Supportive care, x-rays negative   Abnormal EKG History of CAD Severe AS s/p TAVR -EKG showing new T wave inversions inferolaterally.  Troponins overall remains flat -Has had Right sided CP -Initial CXR as above and showed No acute cardiopulmonary abnormality. Repeat as below -Continue to Monitor for CP   Depression -Continue Escitalopram  10 mg p every Morning .  Hyponatremia -Na+ Trend: Recent Labs  Lab 07/04/23 0351 07/05/23 0342 07/06/23 0335 07/07/23 0318 07/08/23 0337 07/09/23 0425 07/10/23 0358  NA 134* 134* 131* 133* 133* 136 134*  -Continue to Monitor and Trend and repeat CMP in the AM   Type 2 Diabetes Mellitus  -On Sensitive sliding scale and Accu-Cheks -CBG Trend: Recent Labs  Lab 07/09/23 2048 07/09/23 2323 07/10/23 0344 07/10/23 0748 07/10/23 1143 07/10/23 1211 07/10/23 1627  GLUCAP 192* 236* 124* 124* 115* 133* 145*   Hypertension -Continue Carvedilol  12.5 mg po BID and and Isosorbide  Mononitrate 30 mg po Daily.  -C/w IV Hydralazine  10 mg q4hprn SBP >180 -Continue to Monitor BP Per Protocol  -Last BP Reading was 134/55  Leukocytosis -WBC Trend: Recent Labs  Lab 07/04/23 0351 07/05/23 0342 07/06/23 0335 07/07/23 0318 07/08/23 0337 07/09/23 0425 07/10/23 0358  WBC 10.7* 10.0 8.8 9.5 9.6 13.0* 10.0  -Repeat Blood Cx x2 on 1/11 showed NGTD at < 24 hours -Repeat CXR done this AM and showed no acute  Cardiopulmonary Process -Will restart IV Abx with po Azithromycin  and IV Ceftriaxone  given Infiltrate on CXR -CXR yesterday done and showed Slightly increasing interstitial opacity at the right lung base, which may represent atelectasis versus developing infiltrate. -Check SLP and was Somnolent today  -Repeat U/A done showed moderate hemoglobin, small leukocytes, negative nitrites, 30 protein, rare bacteria, 0-5 RBCs per high-power field, 0-5 squamous of the cells and 6-10 WBCs with urine culture pending -Continue to Monitor and repeat CXR in the AM    Hyperlipidemia -Continue Atorvastatin  40 mg po Daily and Ezetimibe  10 mg po every Evening but may need to hold given mildly elevated AST  Elevated AST -Mild. AST trend: Recent Labs  Lab 06/17/23 0158 07/09/23 0425 07/10/23 0358  AST 32 73* 72*  -Continue to Monitor and Trend and repeat CMP in the AM  . Hypothyroidism -Elevated TSH on admission of 38.309, normal free T4 of 0.89.  Increased Levothyroxine  to 100 mcg. -Repeat TSH is now 20.957 on Last Check -Repeat TFTs outpatient in 4 weeks   Chronic Normocytic Anemia -Hgb/Hct Trend: Recent Labs  Lab 07/04/23 0351 07/05/23 0342 07/06/23 0335 07/07/23 0318 07/08/23 0337 07/09/23 0425 07/10/23 0358  HGB 8.8* 8.6* 8.6* 8.0* 8.3* 8.3* 8.4*  HCT 28.4* 26.8* 27.2* 25.4* 26.8* 26.0* 26.8*  MCV 92.8  91.2 90.4 90.4 90.8 89.3 90.2  -Check Anemia Panel in the AM -Continue to Monitor and Trend and repeat CBC in the AM   Hypoalbuminemia -Patient's Albumin  Trend: Recent Labs  Lab 06/17/23 0158 07/09/23 0425 07/10/23 0358  ALBUMIN  3.1* 3.0* 2.9*  -Continue to Monitor and Trend and repeat CMP in the AM  Class I Obesity -Complicates overall prognosis and care -Estimated body mass index is 30.11 kg/m as calculated from the following:   Height as of this encounter: 5' 3 (1.6 m).   Weight as of this encounter: 77.1 kg.  -Weight Loss and Dietary Counseling  given   PT/OT-SNF -TOC consulted and awaiting for Insurance Authorization    DVT prophylaxis: SCDs Start: 07/02/23 0430    Code Status: Full Code Family Communication: Discussed with the patient's daughter [name] over the phone  Disposition Plan:  Level of care: Progressive Status is: Inpatient Remains inpatient appropriate because: Needs a safe discharge disposition to SNF and insurance authorization improvement in her somnolence and drowsiness   Consultants:  Orthopedic Surgery  Procedures:  As delineated above  Antimicrobials:  Anti-infectives (From admission, onward)    Start     Dose/Rate Route Frequency Ordered Stop   07/09/23 1930  cefTRIAXone  (ROCEPHIN ) 1 g in sodium chloride  0.9 % 100 mL IVPB        1 g 200 mL/hr over 30 Minutes Intravenous Every 24 hours 07/09/23 1819     07/09/23 1930  doxycycline  (VIBRA -TABS) tablet 100 mg        100 mg Oral Every 12 hours 07/09/23 1843     07/09/23 1915  azithromycin  (ZITHROMAX ) tablet 500 mg  Status:  Discontinued        500 mg Oral Daily 07/09/23 1819 07/09/23 1843   07/04/23 2200  ceFAZolin  (ANCEF ) IVPB 1 g/50 mL premix  Status:  Discontinued        1 g 100 mL/hr over 30 Minutes Intravenous Every 24 hours 07/04/23 1127 07/06/23 0804   07/02/23 2100  cefTRIAXone  (ROCEPHIN ) 1 g in sodium chloride  0.9 % 100 mL IVPB  Status:  Discontinued        1 g 200 mL/hr over 30 Minutes Intravenous Every 24 hours 07/02/23 0432 07/04/23 1126   07/01/23 2145  cefTRIAXone  (ROCEPHIN ) 1 g in sodium chloride  0.9 % 100 mL IVPB        1 g 200 mL/hr over 30 Minutes Intravenous  Once 07/01/23 2141 07/02/23 1522       Subjective: Seen and examined at bedside and again was somnolent drowsy and difficult to arouse but she did answer questions.  Nursing noted that she was very lethargic yesterday and again this morning.  Subsequently her lethargy lasted longer today than yesterday.  She did receive trazodone  overnight which has now been  discontinued.  Renal function continues to improve.  ABG has been unremarkable.  Will need SLP given that the chest x-ray showed possible right lower lobe pneumonia.  Subsequently this evening she woke up but when nursing tried to give her medications she started coughing with any water and applesauce but then was fine later.  Objective: Vitals:   07/10/23 0635 07/10/23 1200 07/10/23 1523 07/10/23 1946  BP: (!) 131/50 (!) 145/64 (!) 131/59 (!) 134/55  Pulse: 73 69 70 71  Resp: 16 18 16 19   Temp: 99.4 F (37.4 C) 98.3 F (36.8 C) 98.5 F (36.9 C) 98.6 F (37 C)  TempSrc:  Axillary Oral Oral  SpO2: 100% 100% 100% 100%  Weight:      Height:        Intake/Output Summary (Last 24 hours) at 07/10/2023 2040 Last data filed at 07/10/2023 2004 Gross per 24 hour  Intake 697 ml  Output 650 ml  Net 47 ml   Filed Weights   07/03/23 2300  Weight: 77.1 kg   Examination: Physical Exam:  Constitutional: Elderly obese African-American female who is somnolent and drowsy but very lethargic Respiratory: Diminished to auscultation bilaterally, no wheezing, rales, rhonchi or crackles. Normal respiratory effort and patient is not tachypenic. No accessory muscle use.  Wearing supplemental oxygen via nasal cannula today Cardiovascular: RRR, no murmurs / rubs / gallops. S1 and S2 auscultated.  No appreciable extremity edema Abdomen: Soft, non-tender, distended secondary body habitus. Bowel sounds positive.  GU: Deferred. Musculoskeletal: No clubbing / cyanosis of digits/nails. No joint deformity upper and lower extremities.  Skin: No rashes, lesions, ulcers on the skin evaluation. No induration; Warm and dry.  Neurologic: Very somnolent and drowsy and difficult to arouse Psychiatric: Impaired judgment and insight  Data Reviewed: I have personally reviewed following labs and imaging studies  CBC: Recent Labs  Lab 07/06/23 0335 07/07/23 0318 07/08/23 0337 07/09/23 0425 07/10/23 0358  WBC 8.8  9.5 9.6 13.0* 10.0  NEUTROABS  --   --   --  9.7* 7.1  HGB 8.6* 8.0* 8.3* 8.3* 8.4*  HCT 27.2* 25.4* 26.8* 26.0* 26.8*  MCV 90.4 90.4 90.8 89.3 90.2  PLT 141* 130* 155 165 164   Basic Metabolic Panel: Recent Labs  Lab 07/06/23 0335 07/07/23 0318 07/08/23 0337 07/09/23 0425 07/10/23 0358  NA 131* 133* 133* 136 134*  K 4.1 4.3 4.2 4.6 4.5  CL 102 102 104 105 107  CO2 18* 21* 20* 20* 17*  GLUCOSE 126* 203* 159* 130* 135*  BUN 72* 77* 81* 80* 81*  CREATININE 3.45* 4.25* 3.65* 3.42* 3.06*  CALCIUM  8.0* 7.9* 8.2* 8.7* 8.3*  MG 2.1 1.8 2.0 1.9 2.0  PHOS  --   --   --  4.9* 4.7*   GFR: Estimated Creatinine Clearance: 12.3 mL/min (A) (by C-G formula based on SCr of 3.06 mg/dL (H)). Liver Function Tests: Recent Labs  Lab 07/09/23 0425 07/10/23 0358  AST 73* 72*  ALT 20 23  ALKPHOS 66 64  BILITOT 0.7 0.8  PROT 6.4* 6.6  ALBUMIN  3.0* 2.9*   No results for input(s): LIPASE, AMYLASE in the last 168 hours. Recent Labs  Lab 07/09/23 1229  AMMONIA <10   Coagulation Profile: No results for input(s): INR, PROTIME in the last 168 hours. Cardiac Enzymes: No results for input(s): CKTOTAL, CKMB, CKMBINDEX, TROPONINI in the last 168 hours. BNP (last 3 results) No results for input(s): PROBNP in the last 8760 hours. HbA1C: No results for input(s): HGBA1C in the last 72 hours. CBG: Recent Labs  Lab 07/10/23 0344 07/10/23 0748 07/10/23 1143 07/10/23 1211 07/10/23 1627  GLUCAP 124* 124* 115* 133* 145*   Lipid Profile: No results for input(s): CHOL, HDL, LDLCALC, TRIG, CHOLHDL, LDLDIRECT in the last 72 hours. Thyroid  Function Tests: Recent Labs    07/09/23 1236  TSH 20.957*   Anemia Panel: No results for input(s): VITAMINB12, FOLATE, FERRITIN, TIBC, IRON, RETICCTPCT in the last 72 hours. Sepsis Labs: No results for input(s): PROCALCITON, LATICACIDVEN in the last 168 hours.  Recent Results (from the past 240 hours)  Urine  Culture     Status: Abnormal   Collection Time: 07/01/23  9:42 PM   Specimen: Urine, Clean  Catch  Result Value Ref Range Status   Specimen Description   Final    URINE, CLEAN CATCH Performed at [facility], [address], [place], [place] [address]    Special Requests   Final    NONE Performed at [facility], [address], [place], [place] [address]    Culture >=100,000 COLONIES/mL ESCHERICHIA COLI (A)  Final   Report Status 07/04/2023 FINAL  Final   Organism ID, Bacteria ESCHERICHIA COLI (A)  Final      Susceptibility   Escherichia coli - MIC*    AMPICILLIN >=32 RESISTANT Resistant     CEFAZOLIN  16 SENSITIVE Sensitive     CEFEPIME <=0.12 SENSITIVE Sensitive     CEFTRIAXONE  1 SENSITIVE Sensitive     CIPROFLOXACIN <=0.25 SENSITIVE Sensitive     GENTAMICIN <=1 SENSITIVE Sensitive     IMIPENEM <=0.25 SENSITIVE Sensitive     NITROFURANTOIN <=16 SENSITIVE Sensitive     TRIMETH/SULFA <=20 SENSITIVE Sensitive     AMPICILLIN/SULBACTAM >=32 RESISTANT Resistant     PIP/TAZO 8 SENSITIVE Sensitive ug/mL    * >=100,000 COLONIES/mL ESCHERICHIA COLI  Culture, blood (Routine X 2) w Reflex to ID Panel     Status: None (Preliminary result)   Collection Time: 07/09/23 12:29 PM   Specimen: BLOOD RIGHT ARM  Result Value Ref Range Status   Specimen Description BLOOD RIGHT ARM  Final   Special Requests   Final    BOTTLES DRAWN AEROBIC ONLY Blood Culture results may not be optimal due to an inadequate volume of blood received in culture bottles   Culture   Final    NO GROWTH < 24 HOURS Performed at [facility], [address], [place], [place] [address]    Report Status PENDING  Incomplete  Culture, blood (Routine X 2) w Reflex to ID Panel     Status: None (Preliminary result)   Collection Time: 07/09/23 12:36 PM   Specimen: BLOOD RIGHT ARM  Result Value Ref Range Status   Specimen Description BLOOD RIGHT ARM  Final   Special Requests   Final     BOTTLES DRAWN AEROBIC ONLY Blood Culture results may not be optimal due to an inadequate volume of blood received in culture bottles   Culture   Final    NO GROWTH < 24 HOURS Performed at [facility], [address] [place], [place] [address]    Report Status PENDING  Incomplete    Radiology Studies: DG CHEST PORT 1 VIEW Result Date: 07/10/2023 CLINICAL DATA:  Short of breath EXAM: PORTABLE CHEST 1 VIEW COMPARISON:  07/09/2023 FINDINGS: LEFT-sided pacer overlies normal cardiac silhouette. Aortic valve noted. Lungs are clear. No acute osseous abnormality. IMPRESSION: No acute cardiopulmonary process. Electronically Signed   By: Jackquline Boxer M.D.   On: 07/10/2023 08:55   DG CHEST PORT 1 VIEW Result Date: 07/09/2023 CLINICAL DATA:  Shortness of breath EXAM: PORTABLE CHEST 1 VIEW COMPARISON:  07/07/2023 FINDINGS: Left-sided implanted cardiac device remains in place. The heart size and mediastinal contours are stable. Slightly increasing interstitial opacity at the right lung base. No pleural effusion or pneumothorax. The visualized skeletal structures are unremarkable. IMPRESSION: Slightly increasing interstitial opacity at the right lung base, which may represent atelectasis versus developing infiltrate. Electronically Signed   By: Mabel Converse D.O.   On: 07/09/2023 16:45   CT HEAD WO CONTRAST ( ) Result Date: 07/09/2023 CLINICAL DATA:  Mental status change, unknown cause. EXAM: CT HEAD WITHOUT  CONTRAST TECHNIQUE: Contiguous axial images were obtained from the base of the skull through the vertex without intravenous contrast. RADIATION DOSE REDUCTION: This exam was performed according to the departmental dose-optimization program which includes automated exposure control, adjustment of the mA and/or kV according to patient size and/or use of iterative reconstruction technique. COMPARISON:  CT head without contrast 07/01/2023 FINDINGS: Brain: Moderate atrophy and white matter disease is  similar the prior study. No acute infarct, hemorrhage, or mass lesion is present. The ventricles are of normal size. No significant extraaxial fluid collection is present. The brainstem and cerebellum are within normal limits. A relatively empty sella is present. Vascular: Atherosclerotic calcifications are present within the cavernous internal carotid arteries bilaterally and at the dural margin of both vertebral arteries. No hyperdense vessel is present. Skull: Calvarium is intact. No focal lytic or blastic lesions are present. No significant extracranial soft tissue lesion is present. Sinuses/Orbits: The paranasal sinuses and mastoid air cells are clear. Bilateral lens replacements are noted. Globes and orbits are otherwise unremarkable. IMPRESSION: 1. No acute intracranial abnormality or significant interval change. 2. Moderate atrophy and white matter disease is similar the prior study and likely reflects the sequela of chronic microvascular ischemia. Electronically Signed   By: Lonni Necessary M.D.   On: 07/09/2023 10:31   Scheduled Meds:  aspirin   81 mg Oral Daily   atorvastatin   40 mg Oral QPM   carvedilol   12.5 mg Oral BID WC   diclofenac  Sodium  2 g Topical QID   docusate sodium   100 mg Oral BID   doxycycline   100 mg Oral Q12H   escitalopram   10 mg Oral q morning   ezetimibe   10 mg Oral QPM   feeding supplement  1 Container Oral TID BM   guaiFENesin   1,200 mg Oral BID   insulin  aspart  0-9 Units Subcutaneous Q4H   isosorbide  mononitrate  30 mg Oral q morning   levothyroxine   100 mcg Oral Q0600   lidocaine   1 patch Transdermal Q24H   mouth rinse  15 mL Mouth Rinse 4 times per day   polyethylene glycol  17 g Oral BID   senna-docusate  1 tablet Oral BID   sodium bicarbonate   1,300 mg Oral BID   sodium bicarbonate   650 mg Oral QHS   Continuous Infusions:  cefTRIAXone  (ROCEPHIN )  IV 1 g (07/10/23 1856)   dextrose  5 % and 0.9 % NaCl with KCl 20 mEq/L 75 mL/hr at 07/10/23 1958     LOS: 8 days   Alejandro Marker, DO [facility] Available via Epic secure chat 7am-7pm After these hours, please refer to coverage provider listed on [email or web address] 07/10/2023, 8:40 PM

## 2023-07-10 NOTE — Plan of Care (Signed)
  Problem: Coping: Goal: Ability to adjust to condition or change in health will improve Outcome: Progressing   Problem: Skin Integrity: Goal: Risk for impaired skin integrity will decrease Outcome: Progressing   Problem: Tissue Perfusion: Goal: Adequacy of tissue perfusion will improve Outcome: Progressing   Problem: Pain Management: Goal: General experience of comfort will improve Outcome: Progressing

## 2023-07-10 NOTE — Progress Notes (Signed)
 SLP Cancellation Note  Patient Details Name: Molly Cobb MRN: [id number] DOB: [birth year]   Cancelled treatment:       Reason Eval/Treat Not Completed: Fatigue/lethargy limiting ability to participate. SLP unable to rouse patient and when MD entered room, patient would only briefly rouse to loud voice, sternal rub. MD concerned about possible aspiration. SLP and MD discussed and both in agreement to downgrade diet to dysphagia 3 (mechanical soft) solids at this time. SLP will follow for readiness.   Norleen IVAR Blase, MA, CCC-SLP Speech Therapy

## 2023-07-11 ENCOUNTER — Inpatient Hospital Stay (HOSPITAL_COMMUNITY)
Admit: 2023-07-11 | Discharge: 2023-07-11 | Disposition: A | Discharge status: Home / Self Care | Payer: Medicare Other | Attending: Internal Medicine

## 2023-07-11 DIAGNOSIS — N184 Chronic kidney disease, stage 4 (severe): Secondary | ICD-10-CM | POA: Diagnosis not present

## 2023-07-11 DIAGNOSIS — R4182 Altered mental status, unspecified: Secondary | ICD-10-CM

## 2023-07-11 DIAGNOSIS — G9341 Metabolic encephalopathy: Secondary | ICD-10-CM | POA: Diagnosis not present

## 2023-07-11 DIAGNOSIS — S82001A Unspecified fracture of right patella, initial encounter for closed fracture: Secondary | ICD-10-CM | POA: Diagnosis not present

## 2023-07-11 DIAGNOSIS — N179 Acute kidney failure, unspecified: Secondary | ICD-10-CM | POA: Diagnosis not present

## 2023-07-11 LAB — COMPREHENSIVE METABOLIC PANEL
ALT: 21 U/L (ref 0–44)
AST: 52 U/L — ABNORMAL HIGH (ref 15–41)
Albumin: 2.6 g/dL — ABNORMAL LOW (ref 3.5–5.0)
Alkaline Phosphatase: 57 U/L (ref 38–126)
Anion gap: 10 (ref 5–15)
BUN: 83 mg/dL — ABNORMAL HIGH (ref 8–23)
CO2: 19 mmol/L — ABNORMAL LOW (ref 22–32)
Calcium: 8.6 mg/dL — ABNORMAL LOW (ref 8.9–10.3)
Chloride: 109 mmol/L (ref 98–111)
Creatinine, Ser: 3.14 mg/dL — ABNORMAL HIGH (ref 0.44–1.00)
GFR, Estimated: 14 mL/min — ABNORMAL LOW (ref >60)
Glucose, Bld: 199 mg/dL — ABNORMAL HIGH (ref 70–99)
Potassium: 5 mmol/L (ref 3.5–5.1)
Sodium: 138 mmol/L (ref 135–145)
Total Bilirubin: 0.5 mg/dL (ref 0.0–1.2)
Total Protein: 6.1 g/dL — ABNORMAL LOW (ref 6.5–8.1)

## 2023-07-11 LAB — GLUCOSE, CAPILLARY
Glucose-Capillary: 144 mg/dL — ABNORMAL HIGH (ref 70–99)
Glucose-Capillary: 227 mg/dL — ABNORMAL HIGH (ref 70–99)
Glucose-Capillary: 198 mg/dL — ABNORMAL HIGH (ref 70–99)
Glucose-Capillary: 160 mg/dL — ABNORMAL HIGH (ref 70–99)
Glucose-Capillary: 260 mg/dL — ABNORMAL HIGH (ref 70–99)
Glucose-Capillary: 142 mg/dL — ABNORMAL HIGH (ref 70–99)

## 2023-07-11 LAB — CBC WITH DIFFERENTIAL/PLATELET
Abs Immature Granulocytes: 0.06 10*3/uL (ref 0.00–0.07)
Basophils Absolute: 0 10*3/uL (ref 0.0–0.1)
Basophils Relative: 0 %
Eosinophils Absolute: 0.2 10*3/uL (ref 0.0–0.5)
Eosinophils Relative: 2 %
HCT: 25.2 % — ABNORMAL LOW (ref 36.0–46.0)
Hemoglobin: 8.1 g/dL — ABNORMAL LOW (ref 12.0–15.0)
Immature Granulocytes: 1 %
Lymphocytes Relative: 15 %
Lymphs Abs: 1.2 10*3/uL (ref 0.7–4.0)
MCH: 29.1 pg (ref 26.0–34.0)
MCHC: 32.1 g/dL (ref 30.0–36.0)
MCV: 90.6 fL (ref 80.0–100.0)
Monocytes Absolute: 1 10*3/uL (ref 0.1–1.0)
Monocytes Relative: 12 %
Neutro Abs: 5.9 10*3/uL (ref 1.7–7.7)
Neutrophils Relative %: 70 %
Platelets: 164 10*3/uL (ref 150–400)
RBC: 2.78 MIL/uL — ABNORMAL LOW (ref 3.87–5.11)
RDW: 14.7 % (ref 11.5–15.5)
WBC: 8.4 10*3/uL (ref 4.0–10.5)
nRBC: 0.2 % (ref 0.0–0.2)

## 2023-07-11 LAB — PHOSPHORUS: Phosphorus: 5.3 mg/dL — ABNORMAL HIGH (ref 2.5–4.6)

## 2023-07-11 LAB — MAGNESIUM: Magnesium: 2 mg/dL (ref 1.7–2.4)

## 2023-07-11 NOTE — Progress Notes (Signed)
 Physical Therapy Treatment Patient Details Name: Molly Cobb MRN: [id number] DOB: Apr 20, 1932 Today's Date: 07/11/2023   History of Present Illness Patient is a 89 year old female who presented on 1/3 with pain in R knee and face after fall. Patient was found to have mild patellar fracture, UTI, and acute metabolic encephalopathy. PMH: CKD, CAD, depression, type II DM, gout, hypertension, hypothyroidism, severe aortic stenosis with history of TAVR, history of pacemaker placement, hypothyroidism.    PT Comments   The patient reportedly had been lethargic this AM but perked up  prior to PT arrival. Patient able to participate in mobility, Assisted to stand and Pivot to [facility] and back to bed using RW and max support of 2 persons.  Patient reporting R knee pain with mobility.  Patient will benefit from continued inpatient follow up therapy, <3 hours/day.   If plan is discharge home, recommend the following: A lot of help with walking and/or transfers;A lot of help with bathing/dressing/bathroom;Assistance with cooking/housework;Assist for transportation;Help with stairs or ramp for entrance   Can travel by private vehicle     No  Equipment Recommendations  None recommended by PT    Recommendations for Other Services       Precautions / Restrictions Precautions Precautions: Fall Required Braces or Orthoses: Other Brace Other Brace: bledsoe Restrictions RLE Weight Bearing Per Provider Order: Weight bearing as tolerated Other Position/Activity Restrictions: with bledsoe brace     Mobility  Bed Mobility   Bed Mobility: Supine to Sit, Sit to Supine     Supine to sit: +2 for physical assistance, HOB elevated, Used rails, Max assist Sit to supine: +2 for physical assistance, Max assist   General bed mobility comments: patient awake, multimodal cues  , required max support to move  both legs and move to sitting on bed edge,  max assist  to return to supine.    Transfers Overall  transfer level: Needs assistance Equipment used: Rolling walker (2 wheels) Transfers: Sit to/from Stand Sit to Stand: Mod assist, From elevated surface, +2 physical assistance   Step pivot transfers: Mod assist, +2 safety/equipment, From elevated surface       General transfer comment: multimodal cues , increased time, , max support of 2 to stand from bed and BSC, cues to bend L knee to facilitate standing . Patient having BM, assisted to step/pivot to [facility] then back to bed, +2 mod support, Patient able  to weight  bear on R leg to step.    Ambulation/Gait                   Stairs             Wheelchair Mobility     Tilt Bed    Modified Rankin (Stroke Patients Only)       Balance Overall balance assessment: Needs assistance   Sitting balance-Leahy Scale: Fair Sitting balance - Comments: pt briefly able to maintain sitting EOB without assist however due to cognition required more assist   Standing balance support: During functional activity, Bilateral upper extremity supported, Reliant on assistive device for balance Standing balance-Leahy Scale: Poor Standing balance comment: reliant on Rw and support                            Cognition Arousal: Alert Behavior During Therapy: WFL for tasks assessed/performed Overall Cognitive Status: History of cognitive impairments - at baseline  General Comments: pt states that her BD just passed, reoriented to MO  and yr        Exercises      General Comments        Pertinent Vitals/Pain Pain Assessment Faces Pain Scale: Hurts whole lot Pain Location: right knee Pain Descriptors / Indicators: Guarding, Grimacing, Moaning Pain Intervention(s): Monitored during session, Limited activity within patient's tolerance    Home Living                          Prior Function            PT Goals (current goals can now be found in the care plan  section) Progress towards PT goals: Progressing toward goals    Frequency    Min 1X/week      PT Plan      Co-evaluation              AM-PAC PT 6 Clicks Mobility   Outcome Measure  Help needed turning from your back to your side while in a flat bed without using bedrails?: A Lot Help needed moving from lying on your back to sitting on the side of a flat bed without using bedrails?: A Lot Help needed moving to and from a bed to a chair (including a wheelchair)?: Total Help needed standing up from a chair using your arms (e.g., wheelchair or bedside chair)?: Total Help needed to walk in hospital room?: Total Help needed climbing 3-5 steps with a railing? : Total 6 Click Score: 8    End of Session Equipment Utilized During Treatment: Gait belt Activity Tolerance: Patient tolerated treatment well Patient left: in bed;with call bell/phone within reach;with nursing/sitter in room Nurse Communication: Mobility status PT Visit Diagnosis: Difficulty in walking, not elsewhere classified (R26.2);Muscle weakness (generalized) (M62.81)     Time: 8782-8762 PT Time Calculation (min) (ACUTE ONLY): 20 min  Charges:    $Therapeutic Activity: 8-22 mins PT General Charges $$ ACUTE PT VISIT: 1 Visit                     [name] PT Acute Rehabilitation Services Office [phone] Weekend [phone]    [name] 07/11/2023, 1:25 PM

## 2023-07-11 NOTE — Procedures (Signed)
 Patient Name: Shermika Balthaser  MRN: [id number]  Epilepsy Attending: Arlin MALVA Krebs  Referring Physician/Provider: Sherrill Alejandro Donovan, DO  Date: 07/11/2023 Duration: 23.47 mins  Patient history: 89yo F with ams getting eeg to evaluate for seizure  Level of alertness: Awake, asleep  AEDs during EEG study: None  Technical aspects: This EEG study was done with scalp electrodes positioned according to the 10-20 International system of electrode placement. Electrical activity was reviewed with band pass filter of 1-70Hz , sensitivity of 7 uV/mm, display speed of 88mm/sec with a 60Hz  notched filter applied as appropriate. EEG data were recorded continuously and digitally stored.  Video monitoring was available and reviewed as appropriate.  Description: The posterior dominant rhythm consists of 7 Hz activity of moderate voltage (25-35 uV) seen predominantly in posterior head regions, symmetric and reactive to eye opening and eye closing. Sleep was characterized by vertex waves, sleep spindles (12 to 14 Hz), maximal frontocentral region. EEG showed continuous generalized 5 to 7 Hz theta slowing.Hyperventilation and photic stimulation were not performed.     ABNORMALITY - Continuous slow, generalized  IMPRESSION: This study is suggestive of mild to moderate diffuse encephalopathy. No seizures or epileptiform discharges were seen throughout the recording.  Everlena Mackley O Jourden Gilson

## 2023-07-11 NOTE — Progress Notes (Signed)
 PROGRESS NOTE    Molly Cobb  FMW:[id number] DOB: 19-Nov-1931 DOA: 07/01/2023 PCP: Fredricka Richerd Ee, MD   Brief Narrative:  The patient is an elderly 89 year old AAF with history of CKD stage IV, CAD, depression, DM2, gout, HTN, hypothyroidism, severe AAS status post TAVR, history of pacemaker, chronic anemia presented to the ED from [facility] [place] for evaluation of right knee and facial pain after a fall.  Per family patient has been more confused over the past few days. Chest x-ray, x-ray pelvis, CT head, cervical spine, CT maxillofacial did not show any fracture or acute trauma.  There was small laceration of the skin noted over the right chin.  X-ray of the right knee showed mild patellar fracture.  Dr. Vernetta from orthopedic was consulted and patient was placed in knee immobilizer and the recommendation is for her to have her knee stay fully extended for the next 6 weeks to allow the fracture to heal with weightbearing as tolerated knee brace and outpatient follow-up in 1 to 2 weeks for repeat knee x-rays. PT/OT is recommending SNF, awaiting placement at this time but in the time that she has been waiting for placement to SNF she became encephalopathic and very somnolent and drowsy the last few days so she was transferred to the progressive care unit for closer observation and will obtain SLP evaluation and restart antibiotics for possible aspiration and pneumonia.  We will also get an EEG for further evaluation and this has been done and showed that the patient has mild to moderate diffuse encephalopathy with no seizures or epileptiform discharges seen throughout the recording.  On a daily basis for the last 3 days patient has been extremely somnolent drowsy during the mornings and wakes up in the evenings.  I discussed with the patient's daughter and the patient daughter states that the patient does what she wants at home and if she does not get up she will sleep.  Will need to adjust her  sleep-wake cycle and placed on delirium precautions.  Medically she appears stable for discharge to the  Assessment & Plan:  Principal Problem:   Patella fracture Active Problems:   Hypothyroidism (acquired)   Type 2 diabetes mellitus with stage 4 chronic kidney disease, without long-term current use of insulin  (HCC)   CKD (chronic kidney disease) stage 4, GFR 15-29 ml/min (HCC)   UTI (urinary tract infection)   Acute metabolic encephalopathy   AKI (acute kidney injury) (HCC)   Metabolic acidosis   Hyperkalemia   Mild Right Patellar Fracture Mechanical fall -Secondary to a mechanical fall.  Orthopedics, Dr. Vernetta, consulted. -Recommend Knee immobilizer for 6 weeks -PT/OT evaluated and SNF once she is stable for discharge and the insurance authorization is still pending and they insurance company has requested updated physical therapy and notes -B12, vitamin D  normal.  Constipation -Bowel regimen initiated and will continue   Urinary Tract Infection -Completed 5 days of antibiotics on 1/7 but restarted as below for Infiltrate noted on CXR  Atypical Right-sided Chest Pain -Musculoskeletal in nature secondary to her fall.  Initial x-ray was negative, will repeat X-Ray done yesterday -Add Lidocaine  Patch and Diclofenac  Gel   Acute Metabolic Encephalopathy and Somnolence and Drowsiness -On admission was Possibly related to UTI.  On 07/09/23 she was very Somnolent and Drowsy but subsequently woke up in the afternoon; today she is getting very sleepy and somnolent drowsy and difficult to arouse -Per ED report, family was concerned that patient has been more confused over the past  few days but over the last few days she was A and O x4; This AM she was extremely somnolent and drowsy -Check ABG and she was not hypercarbic and Ammonia Level normal -Hold Narcotics -Workup as above and because she was still somnolent we transferred her to the progressive care unit and will obtain EEG -EEG  Done and showed This study is suggestive of mild to moderate diffuse encephalopathy. No seizures or epileptiform discharges were seen throughout the recording. -For the last 3 days patient has been extremely somnolent and drowsy during the morning but then wakes up in the afternoon and is fully alert; will need to place her on delirium precautions and ensure that she has a proper sleep-wake cycle -CT head showing no acute intracranial abnormality and no obvious focal neurodeficit on exam.   CKD stage V, stable Mild Metabolic Acidosis, mild hyperkalemia; chronic Uremia -Hyperkalemia resolved and metabolic acidosis stabilizing.  -Baseline creatinine around 3.8-4.5.  BUN is also elevated.  Gentle hydration as needed -BUN/Cr Trend: Recent Labs  Lab 07/05/23 0342 07/06/23 0335 07/07/23 0318 07/08/23 0337 07/09/23 0425 07/10/23 0358 07/11/23 0459  BUN 72* 72* 77* 81* 80* 81* 83*  CREATININE 4.05* 3.45* 4.25* 3.65* 3.42* 3.06* 3.14*  -K+ Trend: Recent Labs  Lab 07/05/23 0342 07/06/23 0335 07/07/23 0318 07/08/23 0337 07/09/23 0425 07/10/23 0358 07/11/23 0459  K 4.0 4.1 4.3 4.2 4.6 4.5 5.0  -Patient's Metabolic Acidosis shows now a CO2 of 19, anion gap of 10, chloride level 109; increased sodium bicarbonate  to 1300 p.o. twice daily and 650 mg p.o. nightly -IVF now stopped -Avoid Nephrotoxic Medications, Contrast Dyes, Hypotension and Dehydration to Ensure Adequate Renal Perfusion and will need to Renally Adjust Meds -Continue to Monitor and Trend Renal Function carefully and repeat CMP in the AM  -Continue outpatient sodium bicarb -Follows with Nephrology at [facility]   Right hand pain -C/w Supportive care, x-rays negative   Abnormal EKG History of CAD Severe AS s/p TAVR -EKG showing new T wave inversions inferolaterally.  Troponins overall remains flat -Has had Right sided CP -Continue to Monitor for CP as she denied any today   Depression -Continue Escitalopram  10 mg  p every Morning .  Hyponatremia -Na+ Trend: Recent Labs  Lab 07/05/23 0342 07/06/23 0335 07/07/23 0318 07/08/23 0337 07/09/23 0425 07/10/23 0358 07/11/23 0459  NA 134* 131* 133* 133* 136 134* 138  -Continue to Monitor and Trend and repeat CMP in the AM   Type 2 Diabetes Mellitus  -On Sensitive sliding scale and Accu-Cheks -CBG Trend: Recent Labs  Lab 07/10/23 1627 07/10/23 2059 07/11/23 0244 07/11/23 0519 07/11/23 0731 07/11/23 1141 07/11/23 1625  GLUCAP 145* 117* 144* 227* 198* 160* 260*   Hypertension -Continue Carvedilol  12.5 mg po BID and and Isosorbide  Mononitrate 30 mg po Daily.  -C/w IV Hydralazine  10 mg q4hprn SBP >180 -Continue to Monitor BP Per Protocol  -Last BP Reading was 126/56  Leukocytosis -WBC Trend: Recent Labs  Lab 07/05/23 0342 07/06/23 0335 07/07/23 0318 07/08/23 0337 07/09/23 0425 07/10/23 0358 07/11/23 0459  WBC 10.0 8.8 9.5 9.6 13.0* 10.0 8.4  -Repeat Blood Cx x2 on 1/11 showed NGTD at < 24 hours -She was placed back on doxycycline  and ceftriaxone  for now and will continue antibiotics for 5 days total and this is day 3 of 5 -SLP evaluated and recommending placing back on a regular diet with thin liquids -Repeat CXR done yesterday showed no acute cardiopulmonary process -Repeat U/A done showed moderate hemoglobin, small leukocytes,  negative nitrites, 30 protein, rare bacteria, 0-5 RBCs per high-power field, 0-5 squamous of the cells and 6-10 WBCs with urine culture pending -Continue to Monitor and repeat CXR in the AM and will need an amatory home O2 screen prior to discharge   Hyperlipidemia -Continue Atorvastatin  40 mg po Daily and Ezetimibe  10 mg po every Evening but may need to hold given mildly elevated AST  Elevated AST -Mild. AST trend: Recent Labs  Lab 06/17/23 0158 07/09/23 0425 07/10/23 0358 07/11/23 0459  AST 32 73* 72* 52*  -Continue to Monitor and Trend and repeat CMP in the AM  . Hypothyroidism -Elevated TSH on  admission of 38.309, normal free T4 of 0.89.  Increased Levothyroxine  to 100 mcg. -Repeat TSH is now 20.957 on Last Check  -Repeat TFTs outpatient in 4 weeks   Chronic Normocytic Anemia -Hgb/Hct Trend: Recent Labs  Lab 07/05/23 0342 07/06/23 0335 07/07/23 0318 07/08/23 0337 07/09/23 0425 07/10/23 0358 07/11/23 0459  HGB 8.6* 8.6* 8.0* 8.3* 8.3* 8.4* 8.1*  HCT 26.8* 27.2* 25.4* 26.8* 26.0* 26.8* 25.2*  MCV 91.2 90.4 90.4 90.8 89.3 90.2 90.6  -Check Anemia Panel in the AM -Continue to Monitor and Trend and repeat CBC in the AM   Hypoalbuminemia -Patient's Albumin  Trend: Recent Labs  Lab 06/17/23 0158 07/09/23 0425 07/10/23 0358 07/11/23 0459  ALBUMIN  3.1* 3.0* 2.9* 2.6*  -Continue to Monitor and Trend and repeat CMP in the AM  Class I Obesity -Complicates overall prognosis and care -Estimated body mass index is 30.11 kg/m as calculated from the following:   Height as of this encounter: 5' 3 (1.6 m).   Weight as of this encounter: 77.1 kg.  -Weight Loss and Dietary Counseling given  PT/OT-SNF -TOC consulted and awaiting for Insurance Authorization    DVT prophylaxis: SCDs Start: 07/02/23 0430    Code Status: Full Code Family Communication: Discussed with the patient's daughter at bedside  Disposition Plan:  Level of care: Progressive Status is: Inpatient Remains inpatient appropriate because: Needs insurance authorization for SNF   Consultants:  Orthopedic surgery  Procedures:  EEG - This study is suggestive of mild to moderate diffuse encephalopathy. No seizures or epileptiform discharges were seen throughout the recording.   Antimicrobials:  Anti-infectives (From admission, onward)    Start     Dose/Rate Route Frequency Ordered Stop   07/09/23 1930  cefTRIAXone  (ROCEPHIN ) 1 g in sodium chloride  0.9 % 100 mL IVPB        1 g 200 mL/hr over 30 Minutes Intravenous Every 24 hours 07/09/23 1819     07/09/23 1930  doxycycline  (VIBRA -TABS) tablet 100 mg         100 mg Oral Every 12 hours 07/09/23 1843     07/09/23 1915  azithromycin  (ZITHROMAX ) tablet 500 mg  Status:  Discontinued        500 mg Oral Daily 07/09/23 1819 07/09/23 1843   07/04/23 2200  ceFAZolin  (ANCEF ) IVPB 1 g/50 mL premix  Status:  Discontinued        1 g 100 mL/hr over 30 Minutes Intravenous Every 24 hours 07/04/23 1127 07/06/23 0804   07/02/23 2100  cefTRIAXone  (ROCEPHIN ) 1 g in sodium chloride  0.9 % 100 mL IVPB  Status:  Discontinued        1 g 200 mL/hr over 30 Minutes Intravenous Every 24 hours 07/02/23 0432 07/04/23 1126   07/01/23 2145  cefTRIAXone  (ROCEPHIN ) 1 g in sodium chloride  0.9 % 100 mL IVPB  1 g 200 mL/hr over 30 Minutes Intravenous  Once 07/01/23 2141 07/02/23 1522       Subjective: Seen and examined at bedside and she again was very somnolent and drowsy and when I sternal rub her she will wake up but did not want to open her eyes.  Per patient's daughter at bedside the patient is stubborn and does what she wants.  Subsequently she woke up in the afternoon was much more awake and alert.  Worked with therapy today and they are still recommending SNF.  SLP also reevaluated once he is more awake and they felt that she would be placed back on a regular diet with thin liquids.  Patient denied any complaints this morning but wanted me to leave her alone and let her sleep.  Objective: Vitals:   07/10/23 2316 07/11/23 0243 07/11/23 0600 07/11/23 1331  BP: (!) 124/50 (!) 135/56 (!) 126/56   Pulse: 69 70 71   Resp: 19   16  Temp: 98.6 F (37 C) 98.5 F (36.9 C) 98.5 F (36.9 C)   TempSrc: Oral Oral Oral   SpO2: 100% 100% 100%   Weight:      Height:        Intake/Output Summary (Last 24 hours) at 07/11/2023 2002 Last data filed at 07/11/2023 1919 Gross per 24 hour  Intake 2438.81 ml  Output 500 ml  Net 1938.81 ml   Filed Weights   07/03/23 2300  Weight: 77.1 kg   Examination: Physical Exam:  Constitutional: Elderly obese African-American  female who remains somewhat drowsy but will wake up and answer questions but keeps her eyes closed and does not really want to interact Respiratory: Diminished to auscultation bilaterally, no wheezing, rales, rhonchi or crackles. Normal respiratory effort and patient is not tachypenic. No accessory muscle use.  Wearing supplemental oxygen via nasal cannula Cardiovascular: RRR, no murmurs / rubs / gallops. S1 and S2 auscultated. No extremity edema.  Abdomen: Soft, non-tender, distended secondary to body habitus. Bowel sounds positive.  GU: Deferred. Musculoskeletal: No clubbing / cyanosis of digits/nails. No joint deformity upper and lower extremities. Skin: No rashes, lesions, ulcers. No induration; Warm and dry.  Neurologic: Somnolent and drowsy but arousable and easier to arouse today compared to yesterday Psychiatric: Impaired judgment and insight but she does answer questions with her eyes closed  Data Reviewed: I have personally reviewed following labs and imaging studies  CBC: Recent Labs  Lab 07/07/23 0318 07/08/23 0337 07/09/23 0425 07/10/23 0358 07/11/23 0459  WBC 9.5 9.6 13.0* 10.0 8.4  NEUTROABS  --   --  9.7* 7.1 5.9  HGB 8.0* 8.3* 8.3* 8.4* 8.1*  HCT 25.4* 26.8* 26.0* 26.8* 25.2*  MCV 90.4 90.8 89.3 90.2 90.6  PLT 130* 155 165 164 164   Basic Metabolic Panel: Recent Labs  Lab 07/07/23 0318 07/08/23 0337 07/09/23 0425 07/10/23 0358 07/11/23 0459  NA 133* 133* 136 134* 138  K 4.3 4.2 4.6 4.5 5.0  CL 102 104 105 107 109  CO2 21* 20* 20* 17* 19*  GLUCOSE 203* 159* 130* 135* 199*  BUN 77* 81* 80* 81* 83*  CREATININE 4.25* 3.65* 3.42* 3.06* 3.14*  CALCIUM  7.9* 8.2* 8.7* 8.3* 8.6*  MG 1.8 2.0 1.9 2.0 2.0  PHOS  --   --  4.9* 4.7* 5.3*   GFR: Estimated Creatinine Clearance: 11.9 mL/min (A) (by C-G formula based on SCr of 3.14 mg/dL (H)). Liver Function Tests: Recent Labs  Lab 07/09/23 0425 07/10/23 0358 07/11/23 0459  AST 73* 72* 52*  ALT 20 23 21   ALKPHOS  66 64 57  BILITOT 0.7 0.8 0.5  PROT 6.4* 6.6 6.1*  ALBUMIN  3.0* 2.9* 2.6*   No results for input(s): LIPASE, AMYLASE in the last 168 hours. Recent Labs  Lab 07/09/23 1229  AMMONIA <10   Coagulation Profile: No results for input(s): INR, PROTIME in the last 168 hours. Cardiac Enzymes: No results for input(s): CKTOTAL, CKMB, CKMBINDEX, TROPONINI in the last 168 hours. BNP (last 3 results) No results for input(s): PROBNP in the last 8760 hours. HbA1C: No results for input(s): HGBA1C in the last 72 hours. CBG: Recent Labs  Lab 07/11/23 0519 07/11/23 0731 07/11/23 1141 07/11/23 1625 07/11/23 1959  GLUCAP 227* 198* 160* 260* 142*   Lipid Profile: No results for input(s): CHOL, HDL, LDLCALC, TRIG, CHOLHDL, LDLDIRECT in the last 72 hours. Thyroid  Function Tests: Recent Labs    07/09/23 1236  TSH 20.957*   Anemia Panel: No results for input(s): VITAMINB12, FOLATE, FERRITIN, TIBC, IRON, RETICCTPCT in the last 72 hours. Sepsis Labs: No results for input(s): PROCALCITON, LATICACIDVEN in the last 168 hours.  Recent Results (from the past 240 hours)  Urine Culture     Status: Abnormal   Collection Time: 07/01/23  9:42 PM   Specimen: Urine, Clean Catch  Result Value Ref Range Status   Specimen Description   Final    URINE, CLEAN CATCH Performed at [facility], [address] [place], [place] [address]    Special Requests   Final    NONE Performed at [facility], [address], [place], [place] [address]    Culture >=100,000 COLONIES/mL ESCHERICHIA COLI (A)  Final   Report Status 07/04/2023 FINAL  Final   Organism ID, Bacteria ESCHERICHIA COLI (A)  Final      Susceptibility   Escherichia coli - MIC*    AMPICILLIN >=32 RESISTANT Resistant     CEFAZOLIN  16 SENSITIVE Sensitive     CEFEPIME <=0.12 SENSITIVE Sensitive     CEFTRIAXONE  1 SENSITIVE Sensitive     CIPROFLOXACIN <=0.25  SENSITIVE Sensitive     GENTAMICIN <=1 SENSITIVE Sensitive     IMIPENEM <=0.25 SENSITIVE Sensitive     NITROFURANTOIN <=16 SENSITIVE Sensitive     TRIMETH/SULFA <=20 SENSITIVE Sensitive     AMPICILLIN/SULBACTAM >=32 RESISTANT Resistant     PIP/TAZO 8 SENSITIVE Sensitive ug/mL    * >=100,000 COLONIES/mL ESCHERICHIA COLI  Culture, blood (Routine X 2) w Reflex to ID Panel     Status: None (Preliminary result)   Collection Time: 07/09/23 12:29 PM   Specimen: BLOOD RIGHT ARM  Result Value Ref Range Status   Specimen Description BLOOD RIGHT ARM  Final   Special Requests   Final    BOTTLES DRAWN AEROBIC ONLY Blood Culture results may not be optimal due to an inadequate volume of blood received in culture bottles   Culture   Final    NO GROWTH 2 DAYS Performed at [facility], [address] [place], [place] [address]    Report Status PENDING  Incomplete  Culture, blood (Routine X 2) w Reflex to ID Panel     Status: None (Preliminary result)   Collection Time: 07/09/23 12:36 PM   Specimen: BLOOD RIGHT ARM  Result Value Ref Range Status   Specimen Description BLOOD RIGHT ARM  Final   Special Requests   Final    BOTTLES DRAWN AEROBIC ONLY Blood Culture results may not be optimal  due to an inadequate volume of blood received in culture bottles   Culture   Final    NO GROWTH 2 DAYS Performed at [facility], [address] [place], [place] [address]    Report Status PENDING  Incomplete    Radiology Studies: EEG adult Result Date: 07/11/2023 Shelton Arlin KIDD, MD     07/11/2023  3:20 PM Patient Name: Molly Cobb MRN: [id number] Epilepsy Attending: Arlin KIDD Shelton Referring Physician/Provider: Sherrill Alejandro Donovan, DO Date: 07/11/2023 Duration: 23.47 mins Patient history: 89yo F with ams getting eeg to evaluate for seizure Level of alertness: Awake, asleep AEDs during EEG study: None Technical aspects: This EEG study was done with scalp electrodes positioned according to the 10-20  International system of electrode placement. Electrical activity was reviewed with band pass filter of 1-70Hz , sensitivity of 7 uV/mm, display speed of 27mm/sec with a 60Hz  notched filter applied as appropriate. EEG data were recorded continuously and digitally stored.  Video monitoring was available and reviewed as appropriate. Description: The posterior dominant rhythm consists of 7 Hz activity of moderate voltage (25-35 uV) seen predominantly in posterior head regions, symmetric and reactive to eye opening and eye closing. Sleep was characterized by vertex waves, sleep spindles (12 to 14 Hz), maximal frontocentral region. EEG showed continuous generalized 5 to 7 Hz theta slowing.Hyperventilation and photic stimulation were not performed.   ABNORMALITY - Continuous slow, generalized IMPRESSION: This study is suggestive of mild to moderate diffuse encephalopathy. No seizures or epileptiform discharges were seen throughout the recording. Arlin KIDD Shelton   DG CHEST PORT 1 VIEW Result Date: 07/10/2023 CLINICAL DATA:  Short of breath EXAM: PORTABLE CHEST 1 VIEW COMPARISON:  07/09/2023 FINDINGS: LEFT-sided pacer overlies normal cardiac silhouette. Aortic valve noted. Lungs are clear. No acute osseous abnormality. IMPRESSION: No acute cardiopulmonary process. Electronically Signed   By: Jackquline Boxer M.D.   On: 07/10/2023 08:55   Scheduled Meds:  aspirin   81 mg Oral Daily   atorvastatin   40 mg Oral QPM   carvedilol   12.5 mg Oral BID WC   diclofenac  Sodium  2 g Topical QID   docusate sodium   100 mg Oral BID   doxycycline   100 mg Oral Q12H   escitalopram   10 mg Oral q morning   ezetimibe   10 mg Oral QPM   feeding supplement  1 Container Oral TID BM   guaiFENesin   1,200 mg Oral BID   insulin  aspart  0-9 Units Subcutaneous Q4H   isosorbide  mononitrate  30 mg Oral q morning   levothyroxine   100 mcg Oral Q0600   lidocaine   1 patch Transdermal Q24H   mouth rinse  15 mL Mouth Rinse 4 times per day    polyethylene glycol  17 g Oral BID   senna-docusate  1 tablet Oral BID   sodium bicarbonate   1,300 mg Oral BID   sodium bicarbonate   650 mg Oral QHS   Continuous Infusions:  cefTRIAXone  (ROCEPHIN )  IV 200 mL/hr at 07/11/23 1919    LOS: 9 days   Alejandro Sherrill, DO [facility] Available via Epic secure chat 7am-7pm After these hours, please refer to coverage provider listed on [email or web address] 07/11/2023, 8:02 PM

## 2023-07-11 NOTE — Progress Notes (Signed)
 EEG complete - results pending

## 2023-07-11 NOTE — Evaluation (Signed)
 Clinical/Bedside Swallow Evaluation Patient Details  Name: Molly Cobb MRN: [id number] Date of Birth: [birth year]  Today's Date: 07/11/2023 Time: SLP Start Time (ACUTE ONLY): 1235 SLP Stop Time (ACUTE ONLY): 1255 SLP Time Calculation (min) (ACUTE ONLY): 20 min  Past Medical History:  Past Medical History:  Diagnosis Date   CKD (chronic kidney disease)    a. followed by Dr. Carlette at [facility]    Coronary artery disease    02/23/17 PCI/DES to mRCA,  normal EF   Depression    Diabetes mellitus (HCC)    Gout    Hypertension    Hypothyroidism    Presence of permanent cardiac pacemaker    a. followed by [facility]   S/P TAVR (transcatheter aortic valve replacement) 04/12/2017   23 mm Edwards Sapien 3 transcatheter heart valve placed via percutaneous right transfemoral approach    Severe aortic stenosis    a. diagnosed 01/2017 during admission for chest pain.    Past Surgical History:  Past Surgical History:  Procedure Laterality Date   APPENDECTOMY     CESAREAN SECTION     CORONARY ATHERECTOMY N/A 02/23/2017   Procedure: CORONARY ATHERECTOMY;  Surgeon: Wonda Sharper, MD;  Location: [facility] INVASIVE CV LAB;  Service: Cardiovascular;  Laterality: N/A;   ESOPHAGOGASTRODUODENOSCOPY N/A 02/18/2017   Procedure: ESOPHAGOGASTRODUODENOSCOPY (EGD);  Surgeon: Teressa Toribio SQUIBB, MD;  Location: [facility] ENDOSCOPY;  Service: Endoscopy;  Laterality: N/A;   EYE SURGERY     FEMUR FRACTURE SURGERY     HOT HEMOSTASIS N/A 02/18/2017   Procedure: HOT HEMOSTASIS (ARGON PLASMA COAGULATION/BICAP);  Surgeon: Teressa Toribio SQUIBB, MD;  Location: [facility] ENDOSCOPY;  Service: Endoscopy;  Laterality: N/A;   IR RADIOLOGY PERIPHERAL GUIDED IV START  03/07/2017   IR US  GUIDE VASC ACCESS RIGHT  03/07/2017   RIGHT/LEFT HEART CATH AND CORONARY ANGIOGRAPHY N/A 02/16/2017   Procedure: RIGHT/LEFT HEART CATH AND CORONARY ANGIOGRAPHY;  Surgeon: Darron Deatrice LABOR, MD;  Location: [facility] INVASIVE CV LAB;  Service: Cardiovascular;  Laterality: N/A;    ROTATOR CUFF REPAIR     TEE WITHOUT CARDIOVERSION N/A 04/12/2017   Procedure: TRANSESOPHAGEAL ECHOCARDIOGRAM (TEE);  Surgeon: Wonda Sharper, MD;  Location: [facility] OR;  Service: Open Heart Surgery;  Laterality: N/A;   THYROID  SURGERY     TRANSCATHETER AORTIC VALVE REPLACEMENT, TRANSFEMORAL N/A 04/12/2017   Procedure: TRANSCATHETER AORTIC VALVE REPLACEMENT, TRANSFEMORAL;  Surgeon: Wonda Sharper, MD;  Location: [facility] OR;  Service: Open Heart Surgery;  Laterality: N/A;   HPI:  Patient is an 89 y.o. female with PMH: GERD, CKD stage IV, CAD, depression, DM-2, gout, HTN, hypothyroidism, severe aortic stenosis status post TAVR, history of pacemaker placement, chronic anemia. She presented to the hospital on 07/02/23 from [facility]) for evaluation of right knee pain and facial pain after a fall. Family was concerned that patient had been more confused the past several days as well. In ED, patient was afebrile, SpO2 100%, CXR not showing acute abnormality, CT c-spine negative for acute abnormality,  CT maxillofacial showing right chin soft tissue laceration or skin wound but no acute facial bone fracture. X-ray of right knee showing mild patellar fracture. Orthopedics (Dr. Vernetta) consulted and patient placed in knee immobilizer. Repeat CXR on 07/09/23 showed Slightly increasing interstitial opacity at the right lung base, which may represent atelectasis versus developing infiltrate, prompting SLP swallow evaluation order.    Assessment / Plan / Recommendation  Clinical Impression  Patient is not currently presenting with clinical s/s of oral or pharyngeal dysphagia as per this bedside  swallow evaluation, however testing limited to thin liquids (water) and medication tablets as patient declining to have any other PO's. Swallow initiation appeared timely and no overt s/s aspiration observed during or immediately after PO intake. Per patient, she usually drinks a supplement shake for breakfast and as far as  lunch and dinner, sometimes I do, sometimes I dont. Her daughter confirmed this but stated, she eats though and denied patient having any unintentional weight loss. SLP left the room to message MD on secure message chat and when he returned to room a couple minutes later, patient was observed to cough, though patient indicating this is her baseline and not related to PO intake. SLP recommending to advance diet back to regular texture solids and will f/u at least once to ensure toleration. SLP Visit Diagnosis: Dysphagia, unspecified (R13.10)    Aspiration Risk  Mild aspiration risk    Diet Recommendation Regular;Thin liquid    Liquid Administration via: Cup;Straw Medication Administration: Whole meds with liquid Supervision: Patient able to self feed Compensations: Slow rate;Small sips/bites Postural Changes: Seated upright at 90 degrees    Other  Recommendations Oral Care Recommendations: Oral care BID    Recommendations for follow up therapy are one component of a multi-disciplinary discharge planning process, led by the attending physician.  Recommendations may be updated based on patient status, additional functional criteria and insurance authorization.  Follow up Recommendations No SLP follow up      Assistance Recommended at Discharge    Functional Status Assessment Patient has had a recent decline in their functional status and demonstrates the ability to make significant improvements in function in a reasonable and predictable amount of time.  Frequency and Duration min 1 x/week  1 week       Prognosis Prognosis for improved oropharyngeal function: Good      Swallow Study   General Date of Onset: 07/11/23 HPI: Patient is an 89 y.o. female with PMH: GERD, CKD stage IV, CAD, depression, DM-2, gout, HTN, hypothyroidism, severe aortic stenosis status post TAVR, history of pacemaker placement, chronic anemia. She presented to the hospital on 07/02/23 from [facility])  for evaluation of right knee pain and facial pain after a fall. Family was concerned that patient had been more confused the past several days as well. In ED, patient was afebrile, SpO2 100%, CXR not showing acute abnormality, CT c-spine negative for acute abnormality,  CT maxillofacial showing right chin soft tissue laceration or skin wound but no acute facial bone fracture. X-ray of right knee showing mild patellar fracture. Orthopedics (Dr. Vernetta) consulted and patient placed in knee immobilizer. Repeat CXR on 07/09/23 showed Slightly increasing interstitial opacity at the right lung base, which may represent atelectasis versus developing infiltrate, prompting SLP swallow evaluation order. Previous Swallow Assessment: none found Diet Prior to this Study: Dysphagia 3 (mechanical soft);Thin liquids (Level 0) Temperature Spikes Noted: No Respiratory Status: Room air History of Recent Intubation: No Behavior/Cognition: Alert;Cooperative;Pleasant mood Oral Cavity Assessment: Within Functional Limits Oral Care Completed by SLP: No Oral Cavity - Dentition: Dentures, top;Dentures, bottom Vision: Functional for self-feeding Self-Feeding Abilities: Able to feed self Patient Positioning: Upright in bed Baseline Vocal Quality: Normal Volitional Cough: Strong Volitional Swallow: Able to elicit    Oral/Motor/Sensory Function Overall Oral Motor/Sensory Function: Within functional limits   Ice Chips     Thin Liquid Thin Liquid: Within functional limits Presentation: Straw;Self Fed    Nectar Thick     Honey Thick     Puree Puree:  Not tested   Solid     Solid: Not tested      Norleen IVAR Blase, MA, CCC-SLP Speech Therapy

## 2023-07-11 NOTE — Progress Notes (Signed)
 SLP Cancellation Note  Patient Details Name: Molly Cobb MRN: [id number] DOB: 1932-06-01   Cancelled treatment:       Reason Eval/Treat Not Completed: Fatigue/lethargy limiting ability to participate per discussion with RN.    Aadil Sur J Abdirizak Richison 07/11/2023, 8:59 AM

## 2023-07-11 NOTE — TOC Progression Note (Signed)
 Transition of Care [facility]) - Progression Note   Patient Details  Name: Molly Cobb MRN: [id number] Date of Birth: 01/20/1932  [facility]) CM/SW Contact  Duwaine GORMAN Aran, LCSW Phone Number: 07/11/2023, 1:46 PM  Clinical Narrative: Updated physician and PT notes requested by [facility]. CSW uploaded clinicals for review. CSW called [facility] and was informed [facility] is not in-network with patient's HMO and provided CSW with list of in-network SNFs: [facility], [address], [place], [facility], [address], [place], [place], [place], [place], [place], [place], and [address]  CSW followed up with Tamera at [facility] regarding [facility] informing CSW patient is out-of-network with the facility. Per Tamera, she has contacted [facility] (which acquired the facility) to get assistance with reaching out to [facility] directly to get the issue resolved. CSW updated daughter, [name].  Barriers to Discharge: English As A Second Language Teacher, SNF Pending bed offer  Expected Discharge Plan and Services In-house Referral: Clinical Social Work Living arrangements for the past 2 months: Independent Living Facility  Social Determinants of Health (SDOH) Interventions SDOH Screenings   Food Insecurity: No Food Insecurity (07/02/2023)  Housing: Low Risk  (07/02/2023)  Transportation Needs: No Transportation Needs (07/02/2023)  Utilities: Not At Risk (07/02/2023)  Financial Resource Strain: Low Risk  (01/11/2022)   [facility] to [facility]28/[facility]22[facility] visits prior to 08/28/2022.  Physical Activity: Unknown (01/11/2022)   [facility] from [facility], [facility] visits prior to 08/28/2022., [facility] visits prior to 08/28/2022., [facility] visits prior to 08/28/2022.  Social Connections:  Unknown (07/02/2023)  Stress: Stress Concern Present (01/11/2022)   [facility] prior to [facility]/28/2022., [facility] visits prior to 08/28/2022.  Tobacco Use: Low Risk  (07/02/2023)   Readmission Risk Interventions    07/05/2023    2:24 PM 09/24/2021   11:03 AM  Readmission Risk Prevention Plan  Transportation Screening Complete Complete  PCP or Specialist Appt within 3-5 Days Complete Complete  HRI or Home Care Consult Complete Complete  Social Work Consult for Recovery Care Planning/Counseling Complete Complete  Palliative Care Screening Not Applicable Not Applicable  Medication Review Oceanographer) Complete Complete

## 2023-07-12 ENCOUNTER — Inpatient Hospital Stay (HOSPITAL_COMMUNITY): Payer: Medicare Other

## 2023-07-12 DIAGNOSIS — S82001A Unspecified fracture of right patella, initial encounter for closed fracture: Secondary | ICD-10-CM | POA: Diagnosis not present

## 2023-07-12 DIAGNOSIS — N179 Acute kidney failure, unspecified: Secondary | ICD-10-CM | POA: Diagnosis not present

## 2023-07-12 DIAGNOSIS — G9341 Metabolic encephalopathy: Secondary | ICD-10-CM | POA: Diagnosis not present

## 2023-07-12 DIAGNOSIS — N184 Chronic kidney disease, stage 4 (severe): Secondary | ICD-10-CM | POA: Diagnosis not present

## 2023-07-12 LAB — GLUCOSE, CAPILLARY
Glucose-Capillary: 113 mg/dL — ABNORMAL HIGH (ref 70–99)
Glucose-Capillary: 129 mg/dL — ABNORMAL HIGH (ref 70–99)
Glucose-Capillary: 129 mg/dL — ABNORMAL HIGH (ref 70–99)
Glucose-Capillary: 135 mg/dL — ABNORMAL HIGH (ref 70–99)
Glucose-Capillary: 138 mg/dL — ABNORMAL HIGH (ref 70–99)
Glucose-Capillary: 228 mg/dL — ABNORMAL HIGH (ref 70–99)
Glucose-Capillary: 237 mg/dL — ABNORMAL HIGH (ref 70–99)

## 2023-07-12 LAB — CBC WITH DIFFERENTIAL/PLATELET
Abs Immature Granulocytes: 0.07 10*3/uL (ref 0.00–0.07)
Basophils Absolute: 0 10*3/uL (ref 0.0–0.1)
Basophils Relative: 0 %
Eosinophils Absolute: 0.2 10*3/uL (ref 0.0–0.5)
Eosinophils Relative: 2 %
HCT: 24.5 % — ABNORMAL LOW (ref 36.0–46.0)
Hemoglobin: 7.7 g/dL — ABNORMAL LOW (ref 12.0–15.0)
Immature Granulocytes: 1 %
Lymphocytes Relative: 13 %
Lymphs Abs: 1.2 10*3/uL (ref 0.7–4.0)
MCH: 28.3 pg (ref 26.0–34.0)
MCHC: 31.4 g/dL (ref 30.0–36.0)
MCV: 90.1 fL (ref 80.0–100.0)
Monocytes Absolute: 1.1 10*3/uL — ABNORMAL HIGH (ref 0.1–1.0)
Monocytes Relative: 12 %
Neutro Abs: 6.8 10*3/uL (ref 1.7–7.7)
Neutrophils Relative %: 72 %
Platelets: 182 10*3/uL (ref 150–400)
RBC: 2.72 MIL/uL — ABNORMAL LOW (ref 3.87–5.11)
RDW: 14.8 % (ref 11.5–15.5)
WBC: 9.4 10*3/uL (ref 4.0–10.5)
nRBC: 0 % (ref 0.0–0.2)

## 2023-07-12 LAB — COMPREHENSIVE METABOLIC PANEL
ALT: 28 U/L (ref 0–44)
AST: 57 U/L — ABNORMAL HIGH (ref 15–41)
Albumin: 2.7 g/dL — ABNORMAL LOW (ref 3.5–5.0)
Alkaline Phosphatase: 61 U/L (ref 38–126)
Anion gap: 9 (ref 5–15)
BUN: 79 mg/dL — ABNORMAL HIGH (ref 8–23)
CO2: 17 mmol/L — ABNORMAL LOW (ref 22–32)
Calcium: 8.4 mg/dL — ABNORMAL LOW (ref 8.9–10.3)
Chloride: 109 mmol/L (ref 98–111)
Creatinine, Ser: 3.09 mg/dL — ABNORMAL HIGH (ref 0.44–1.00)
GFR, Estimated: 14 mL/min — ABNORMAL LOW (ref >60)
Glucose, Bld: 146 mg/dL — ABNORMAL HIGH (ref 70–99)
Potassium: 5 mmol/L (ref 3.5–5.1)
Sodium: 135 mmol/L (ref 135–145)
Total Bilirubin: 0.3 mg/dL (ref 0.0–1.2)
Total Protein: 6.2 g/dL — ABNORMAL LOW (ref 6.5–8.1)

## 2023-07-12 LAB — PHOSPHORUS: Phosphorus: 4.3 mg/dL (ref 2.5–4.6)

## 2023-07-12 LAB — MAGNESIUM: Magnesium: 1.9 mg/dL (ref 1.7–2.4)

## 2023-07-12 NOTE — TOC Progression Note (Signed)
 [facility]) - Progression Note   Patient Details  Name: Molly Cobb MRN: [id number] Date of Birth: [birth year]  [facility]) CM/SW Contact  Duwaine GORMAN Aran, LCSW Phone Number: 07/12/2023, 1:48 PM  Clinical Narrative: CSW and Sheree at [facility] were unable to resolve patient's insurance not being in-network, so [facility] would not approve the patient for SNF until patient/family choose another SNF. CSW spoke with daughter and she requested [facility] as second choice. CSW confirmed with Levon in [place] that the bed will not be available until later this week. Insurance authorization completed on [employer] portal. Reference ID # is: [id number]. Patient is approved for 07/13/2023-07/15/2023 (with a 24 hour grace period for admission by 11:59pm on 07/16/23). TOC awaiting open bed at [facility].  Barriers to Discharge: English As A Second Language Teacher, SNF Pending bed offer  Expected Discharge Plan and Services In-house Referral: Clinical Social Work Living arrangements for the past 2 months: Independent Living Facility  Social Determinants of Health (SDOH) Interventions SDOH Screenings   Food Insecurity: No Food Insecurity (07/02/2023)  Housing: Low Risk  (07/02/2023)  Transportation Needs: No Transportation Needs (07/02/2023)  Utilities: Not At Risk (07/02/2023)  Financial Resource Strain: Low Risk  (01/11/2022)   [facility] prior to 08/28/2022., [facility] visits prior to 08/28/2022.  Physical Activity: Unknown (01/11/2022)   [facility], [facility] prior to 08/28/2022., [facility] prior to 08/28/2022., [facility] visits prior to 08/28/2022.  Social Connections: Unknown (07/02/2023)  Stress: Stress Concern Present (01/11/2022)   [facility]  prior to 08/28/2022., [facility] visits prior to 08/28/2022.  Tobacco Use: Low Risk  (07/02/2023)   Readmission Risk Interventions    07/05/2023    2:24 PM 09/24/2021   11:03 AM  Readmission Risk Prevention Plan  Transportation Screening Complete Complete  PCP or Specialist Appt within 3-5 Days Complete Complete  HRI or Home Care Consult Complete Complete  Social Work Consult for Recovery Care Planning/Counseling Complete Complete  Palliative Care Screening Not Applicable Not Applicable  Medication Review Oceanographer) Complete Complete

## 2023-07-12 NOTE — Plan of Care (Signed)
  Problem: Pain Management: Goal: General experience of comfort will improve Outcome: Progressing   Problem: Safety: Goal: Ability to remain free from injury will improve Outcome: Progressing   Problem: Skin Integrity: Goal: Risk for impaired skin integrity will decrease Outcome: Progressing

## 2023-07-12 NOTE — Progress Notes (Signed)
 PROGRESS NOTE    [name]  FMW:[id number] DOB: 04-28-1932 DOA: 07/01/2023 PCP: Fredricka Richerd Ee, MD   Brief Narrative:  The patient is an elderly 89 year old AAF with history of CKD stage IV, CAD, depression, DM2, gout, HTN, hypothyroidism, severe AAS status post TAVR, history of pacemaker, chronic anemia presented to the ED from [facility] for evaluation of right knee and facial pain after a fall.  Per family patient has been more confused over the past few days. Chest x-ray, x-ray pelvis, CT head, cervical spine, CT maxillofacial did not show any fracture or acute trauma.  There was small laceration of the skin noted over the right chin.  X-ray of the right knee showed mild patellar fracture.  Dr. Vernetta from orthopedic was consulted and patient was placed in knee immobilizer and the recommendation is for her to have her knee stay fully extended for the next 6 weeks to allow the fracture to heal with weightbearing as tolerated knee brace and outpatient follow-up in 1 to 2 weeks for repeat knee x-rays. PT/OT is recommending SNF, awaiting placement at this time but in the time that she has been waiting for placement to SNF she became encephalopathic and very somnolent and drowsy the last few days so she was transferred to the progressive care unit for closer observation and will obtain SLP evaluation and restart antibiotics for possible aspiration and pneumonia.  We will also get an EEG for further evaluation and this has been done and showed that the patient has mild to moderate diffuse encephalopathy with no seizures or epileptiform discharges seen throughout the recording.  The last few days she had been extremely somnolent drowsy in the mornings and wakes up in the evenings but now she is much more awake and alert today.  She appears back to her baseline is medically stable for discharge and will SNF placement however there was an issue with the insurance authorization so bed will be  available later in the week.  Assessment & Plan:  Principal Problem:   Patella fracture Active Problems:   Hypothyroidism (acquired)   Type 2 diabetes mellitus with stage 4 chronic kidney disease, without long-term current use of insulin  (HCC)   CKD (chronic kidney disease) stage 4, GFR 15-29 ml/min (HCC)   UTI (urinary tract infection)   Acute metabolic encephalopathy   AKI (acute kidney injury) (HCC)   Metabolic acidosis   Hyperkalemia   Mild Right Patellar Fracture Mechanical fall -Secondary to a mechanical fall.  Orthopedics, Dr. Vernetta, consulted. -Recommend Knee immobilizer for 6 weeks -PT/OT evaluated and SNF once she is stable for discharge but there was an issue with the patient's insurance not being in network for her to SNF selected so a case worker has spoken with the patient daughter and have requested and was firm but performed better would not be available till later this week -B12, vitamin D  normal.  Constipation -Bowel regimen initiated and will continue   Urinary Tract Infection -Completed 5 days of antibiotics on 1/7 but restarted as below for Infiltrate noted on CXR  Atypical Right-sided Chest Pain -Musculoskeletal in nature secondary to her fall.  Initial x-ray was negative, will repeat X-Ray done yesterday -Add Lidocaine  Patch and Diclofenac  Gel   Acute Metabolic Encephalopathy and Somnolence and Drowsiness, improved -On admission was Possibly related to UTI.  On 07/09/23 she was very Somnolent and Drowsy but subsequently woke up in the afternoon; today she is getting very sleepy and somnolent drowsy and difficult to arouse -Per  ED report, family was concerned that patient has been more confused over the past few days but over the last few days she was A and O x4; This AM she was extremely somnolent and drowsy -Check ABG and she was not hypercarbic and Ammonia Level normal -Hold Narcotics -Workup as above and because she was still somnolent we transferred  her to the progressive care unit and will obtain EEG -EEG Done and showed This study is suggestive of mild to moderate diffuse encephalopathy. No seizures or epileptiform discharges were seen throughout the recording. -For the last 3 days patient has been extremely somnolent and drowsy during the morning but then wakes up in the afternoon and is fully alert; will need to place her on delirium precautions and ensure that she has a proper sleep-wake cycle -CT head showing no acute intracranial abnormality and no obvious focal neurodeficit on exam. -Appears back to her baseline 07/12/23 and is much more awake and alert    CKD stage V, stable Mild Metabolic Acidosis, mild hyperkalemia; chronic Uremia -Hyperkalemia resolved and metabolic acidosis stabilizing.  -Baseline creatinine around 3.8-4.5.  BUN is also elevated.  Gentle hydration as needed -BUN/Cr Trend: Recent Labs  Lab 07/06/23 0335 07/07/23 0318 07/08/23 0337 07/09/23 0425 07/10/23 0358 07/11/23 0459 07/12/23 0500  BUN 72* 77* 81* 80* 81* 83* 79*  CREATININE 3.45* 4.25* 3.65* 3.42* 3.06* 3.14* 3.09*  -K+ Trend: Recent Labs  Lab 07/06/23 0335 07/07/23 0318 07/08/23 0337 07/09/23 0425 07/10/23 0358 07/11/23 0459 07/12/23 0500  K 4.1 4.3 4.2 4.6 4.5 5.0 5.0  -Patient's Metabolic Acidosis shows now a CO2 of 17, anion gap of 9, chloride level 109; C/w increased sodium bicarbonate  to 1300 p.o. twice daily and 650 mg p.o. nightly -IVF now stopped -Avoid Nephrotoxic Medications, Contrast Dyes, Hypotension and Dehydration to Ensure Adequate Renal Perfusion and will need to Renally Adjust Meds -Continue to Monitor and Trend Renal Function carefully and repeat CMP in the AM  -Continue outpatient sodium bicarb -Follows with Nephrology at [facility]   Right Hand Pain -C/w Supportive care, x-rays negative   Abnormal EKG History of CAD Severe AS s/p TAVR -EKG showing new T wave inversions inferolaterally.  Troponins overall  remains flat -Has had Right sided CP -Continue to Monitor for CP as she denied any today   Depression -Continue Escitalopram  10 mg p every Morning .  Hyponatremia -Na+ Trend: Recent Labs  Lab 07/06/23 0335 07/07/23 0318 07/08/23 0337 07/09/23 0425 07/10/23 0358 07/11/23 0459 07/12/23 0500  NA 131* 133* 133* 136 134* 138 135  -Continue to Monitor and Trend and repeat CMP in the AM   Type 2 Diabetes Mellitus  -On Sensitive sliding scale and Accu-Cheks -CBG Trend: Recent Labs  Lab 07/11/23 1625 07/11/23 1959 07/12/23 0008 07/12/23 0427 07/12/23 0729 07/12/23 1129 07/12/23 1618  GLUCAP 260* 142* 113* 129* 129* 138* 228*   Hypertension -Continue Carvedilol  12.5 mg po BID and and Isosorbide  Mononitrate 30 mg po Daily.  -C/w IV Hydralazine  10 mg q4hprn SBP >180 -Continue to Monitor BP Per Protocol  -Last BP Reading was 130/50  Leukocytosis, improved -WBC Trend: Recent Labs  Lab 07/06/23 0335 07/07/23 0318 07/08/23 0337 07/09/23 0425 07/10/23 0358 07/11/23 0459 07/12/23 0500  WBC 8.8 9.5 9.6 13.0* 10.0 8.4 9.4  -Repeat Blood Cx x2 on 1/11 showed NGTD at 3 Days -She was placed back on doxycycline  and ceftriaxone  for now and will continue antibiotics for 5 days total and this is day 4 of 5 -SLP  evaluated and recommending placing back on a regular diet with thin liquids -Repeat CXR done yesterday showed no acute cardiopulmonary process -Repeat U/A done showed moderate hemoglobin, small leukocytes, negative nitrites, 30 protein, rare bacteria, 0-5 RBCs per high-power field, 0-5 squamous of the cells and 6-10 WBCs with urine culture pending -Continue to Monitor and repeat CXR in the AM and will need an amatory home O2 screen prior to discharge   Hyperlipidemia -Continue Atorvastatin  40 mg po Daily and Ezetimibe  10 mg po every Evening but may need to hold given mildly elevated AST  Elevated AST -Mild. AST trend: Recent Labs  Lab 06/17/23 0158 07/09/23 0425  07/10/23 0358 07/11/23 0459 07/12/23 0500  AST 32 73* 72* 52* 57*  -Continue to Monitor and Trend and repeat CMP in the AM  . Hypothyroidism -Elevated TSH on admission of 38.309, normal free T4 of 0.89.  Increased Levothyroxine  to 100 mcg. -Repeat TSH is now 20.957 on Last Check  -Repeat TFTs outpatient in 4 weeks   Chronic Normocytic Anemia -Hgb/Hct Trend: Recent Labs  Lab 07/06/23 0335 07/07/23 0318 07/08/23 0337 07/09/23 0425 07/10/23 0358 07/11/23 0459 07/12/23 0500  HGB 8.6* 8.0* 8.3* 8.3* 8.4* 8.1* 7.7*  HCT 27.2* 25.4* 26.8* 26.0* 26.8* 25.2* 24.5*  MCV 90.4 90.4 90.8 89.3 90.2 90.6 90.1  -Check Anemia Panel in the AM -Continue to Monitor and Trend and repeat CBC in the AM   Hypoalbuminemia -Patient's Albumin  Trend: Recent Labs  Lab 06/17/23 0158 07/09/23 0425 07/10/23 0358 07/11/23 0459 07/12/23 0500  ALBUMIN  3.1* 3.0* 2.9* 2.6* 2.7*  -Continue to Monitor and Trend and repeat CMP in the AM  Class I Obesity -Complicates overall prognosis and care -Estimated body mass index is 30.11 kg/m as calculated from the following:   Height as of this encounter: 5' 3 (1.6 m).   Weight as of this encounter: 77.1 kg.  -Weight Loss and Dietary Counseling given  PT/OT-SNF -TOC consulted and awaiting for Insurance Authorization    DVT prophylaxis: SCDs Start: 07/02/23 0430    Code Status: Full Code Family Communication: No family present at bedside  Disposition Plan:  Level of care: Progressive Status is: Inpatient Remains inpatient appropriate because: Needs Insurance Authorization and SNF Bed   Consultants:  Orthopedic Surgery Discussed with Neurology  Procedures:  As delineated as Above  EEG  - This study is suggestive of mild to moderate diffuse encephalopathy. No seizures or epileptiform discharges were seen throughout the recording.   Antimicrobials:  Anti-infectives (From admission, onward)    Start     Dose/Rate Route Frequency Ordered Stop    07/09/23 1930  cefTRIAXone  (ROCEPHIN ) 1 g in sodium chloride  0.9 % 100 mL IVPB        1 g 200 mL/hr over 30 Minutes Intravenous Every 24 hours 07/09/23 1819     07/09/23 1930  doxycycline  (VIBRA -TABS) tablet 100 mg        100 mg Oral Every 12 hours 07/09/23 1843     07/09/23 1915  azithromycin  (ZITHROMAX ) tablet 500 mg  Status:  Discontinued        500 mg Oral Daily 07/09/23 1819 07/09/23 1843   07/04/23 2200  ceFAZolin  (ANCEF ) IVPB 1 g/50 mL premix  Status:  Discontinued        1 g 100 mL/hr over 30 Minutes Intravenous Every 24 hours 07/04/23 1127 07/06/23 0804   07/02/23 2100  cefTRIAXone  (ROCEPHIN ) 1 g in sodium chloride  0.9 % 100 mL IVPB  Status:  Discontinued  1 g 200 mL/hr over 30 Minutes Intravenous Every 24 hours 07/02/23 0432 07/04/23 1126   07/01/23 2145  cefTRIAXone  (ROCEPHIN ) 1 g in sodium chloride  0.9 % 100 mL IVPB        1 g 200 mL/hr over 30 Minutes Intravenous  Once 07/01/23 2141 07/02/23 1522       Subjective: Seen and examined at bedside.  Much more awake regarding his room but did not really like the food.  No nausea or vomiting.  Feels okay.  Needs to go to SNF but there was an issue with the family slightly elevated but was out of network.  She remains medically stable enough for discharge.  Objective: Vitals:   07/11/23 1331 07/12/23 0425 07/12/23 1227 07/12/23 1929  BP:  (!) 141/48 (!) 125/51 (!) 130/50  Pulse:  71 71 72  Resp: 16 14 16 20   Temp:  98 F (36.7 C) 99.1 F (37.3 C) 99.2 F (37.3 C)  TempSrc:  Oral Oral Oral  SpO2:  100% 100% 99%  Weight:      Height:        Intake/Output Summary (Last 24 hours) at 07/12/2023 1943 Last data filed at 07/12/2023 1800 Gross per 24 hour  Intake 240 ml  Output 950 ml  Net -710 ml   Filed Weights   07/03/23 2300  Weight: 77.1 kg   Examination: Physical Exam:  Constitutional: Elderly obese African-American female who is much more awake and alert today Respiratory: Diminished to auscultation  bilaterally with some coarse breath sounds, no wheezing, rales, rhonchi or crackles. Normal respiratory effort and patient is not tachypenic. No accessory muscle use.  Wearing supplemental oxygen nasal cannula Cardiovascular: RRR, no murmurs / rubs / gallops. S1 and S2 auscultated. No extremity edema.  Abdomen: Soft, non-tender, distended secondary body habitus. Bowel sounds positive.  GU: Deferred. Musculoskeletal: No clubbing / cyanosis of digits/nails. No joint deformity upper and lower extremities. Skin: No rashes, lesions, ulcers on limited skin evaluation. No induration; Warm and dry.  Neurologic: CN 2-12 grossly intact with no focal deficits.  Romberg sign and cerebellar reflexes not assessed.  Psychiatric: She is awake and alert and oriented  Data Reviewed: I have personally reviewed following labs and imaging studies  CBC: Recent Labs  Lab 07/08/23 0337 07/09/23 0425 07/10/23 0358 07/11/23 0459 07/12/23 0500  WBC 9.6 13.0* 10.0 8.4 9.4  NEUTROABS  --  9.7* 7.1 5.9 6.8  HGB 8.3* 8.3* 8.4* 8.1* 7.7*  HCT 26.8* 26.0* 26.8* 25.2* 24.5*  MCV 90.8 89.3 90.2 90.6 90.1  PLT 155 165 164 164 182   Basic Metabolic Panel: Recent Labs  Lab 07/08/23 0337 07/09/23 0425 07/10/23 0358 07/11/23 0459 07/12/23 0500  NA 133* 136 134* 138 135  K 4.2 4.6 4.5 5.0 5.0  CL 104 105 107 109 109  CO2 20* 20* 17* 19* 17*  GLUCOSE 159* 130* 135* 199* 146*  BUN 81* 80* 81* 83* 79*  CREATININE 3.65* 3.42* 3.06* 3.14* 3.09*  CALCIUM  8.2* 8.7* 8.3* 8.6* 8.4*  MG 2.0 1.9 2.0 2.0 1.9  PHOS  --  4.9* 4.7* 5.3* 4.3   GFR: Estimated Creatinine Clearance: 12.1 mL/min (A) (by C-G formula based on SCr of 3.09 mg/dL (H)). Liver Function Tests: Recent Labs  Lab 07/09/23 0425 07/10/23 0358 07/11/23 0459 07/12/23 0500  AST 73* 72* 52* 57*  ALT 20 23 21 28   ALKPHOS 66 64 57 61  BILITOT 0.7 0.8 0.5 0.3  PROT 6.4* 6.6 6.1* 6.2*  ALBUMIN  3.0* 2.9* 2.6* 2.7*   No results for input(s): LIPASE,  AMYLASE in the last 168 hours. Recent Labs  Lab 07/09/23 1229  AMMONIA <10   Coagulation Profile: No results for input(s): INR, PROTIME in the last 168 hours. Cardiac Enzymes: No results for input(s): CKTOTAL, CKMB, CKMBINDEX, TROPONINI in the last 168 hours. BNP (last 3 results) No results for input(s): PROBNP in the last 8760 hours. HbA1C: No results for input(s): HGBA1C in the last 72 hours. CBG: Recent Labs  Lab 07/12/23 0008 07/12/23 0427 07/12/23 0729 07/12/23 1129 07/12/23 1618  GLUCAP 113* 129* 129* 138* 228*   Lipid Profile: No results for input(s): CHOL, HDL, LDLCALC, TRIG, CHOLHDL, LDLDIRECT in the last 72 hours. Thyroid  Function Tests: No results for input(s): TSH, T4TOTAL, FREET4, T3FREE, THYROIDAB in the last 72 hours. Anemia Panel: No results for input(s): VITAMINB12, FOLATE, FERRITIN, TIBC, IRON, RETICCTPCT in the last 72 hours. Sepsis Labs: No results for input(s): PROCALCITON, LATICACIDVEN in the last 168 hours.  Recent Results (from the past 240 hours)  Culture, blood (Routine X 2) w Reflex to ID Panel     Status: None (Preliminary result)   Collection Time: 07/09/23 12:29 PM   Specimen: BLOOD RIGHT ARM  Result Value Ref Range Status   Specimen Description BLOOD RIGHT ARM  Final   Special Requests   Final    BOTTLES DRAWN AEROBIC ONLY Blood Culture results may not be optimal due to an inadequate volume of blood received in culture bottles   Culture   Final    NO GROWTH 3 DAYS Performed at [facility], [address], [place], [place] [address]    Report Status PENDING  Incomplete  Culture, blood (Routine X 2) w Reflex to ID Panel     Status: None (Preliminary result)   Collection Time: 07/09/23 12:36 PM   Specimen: BLOOD RIGHT ARM  Result Value Ref Range Status   Specimen Description BLOOD RIGHT ARM  Final   Special Requests   Final    BOTTLES DRAWN AEROBIC ONLY Blood Culture results  may not be optimal due to an inadequate volume of blood received in culture bottles   Culture   Final    NO GROWTH 3 DAYS Performed at [facility], [address], [place], [place] [address]    Report Status PENDING  Incomplete    Radiology Studies: DG CHEST PORT 1 VIEW Result Date: 07/12/2023 CLINICAL DATA:  Shortness of breath EXAM: PORTABLE CHEST 1 VIEW COMPARISON:  07/10/2023 FINDINGS: Left pacer remains in place, unchanged. Mild cardiomegaly. No confluent airspace opacities, effusions or edema. No acute bony abnormality. Old healed proximal left humeral fracture with deformity. IMPRESSION: Mild cardiomegaly.  No active disease. Electronically Signed   By: Franky Crease M.D.   On: 07/12/2023 09:25   EEG adult Result Date: 07/11/2023 Shelton Arlin KIDD, MD     07/11/2023  3:20 PM Patient Name: Molly Cobb MRN: [id number] Epilepsy Attending: Arlin KIDD Shelton Referring Physician/Provider: Sherrill Alejandro Donovan, DO Date: 07/11/2023 Duration: 23.47 mins Patient history: 89yo F with ams getting eeg to evaluate for seizure Level of alertness: Awake, asleep AEDs during EEG study: None Technical aspects: This EEG study was done with scalp electrodes positioned according to the 10-20 International system of electrode placement. Electrical activity was reviewed with band pass filter of 1-70Hz , sensitivity of 7 uV/mm, display speed of 85mm/sec with a 60Hz  notched filter applied as appropriate. EEG data were recorded continuously and digitally stored.  Video monitoring was available and reviewed as appropriate. Description: The posterior dominant rhythm consists of 7 Hz activity of moderate voltage (25-35 uV) seen predominantly in posterior head regions, symmetric and reactive to eye opening and eye closing. Sleep was characterized by vertex waves, sleep spindles (12 to 14 Hz), maximal frontocentral region. EEG showed continuous generalized 5 to 7 Hz theta slowing.Hyperventilation and photic stimulation were not  performed.   ABNORMALITY - Continuous slow, generalized IMPRESSION: This study is suggestive of mild to moderate diffuse encephalopathy. No seizures or epileptiform discharges were seen throughout the recording. Priyanka O Yadav   Scheduled Meds:  aspirin   81 mg Oral Daily   atorvastatin   40 mg Oral QPM   carvedilol   12.5 mg Oral BID WC   diclofenac  Sodium  2 g Topical QID   docusate sodium   100 mg Oral BID   doxycycline   100 mg Oral Q12H   escitalopram   10 mg Oral q morning   ezetimibe   10 mg Oral QPM   feeding supplement  1 Container Oral TID BM   guaiFENesin   1,200 mg Oral BID   insulin  aspart  0-9 Units Subcutaneous Q4H   isosorbide  mononitrate  30 mg Oral q morning   levothyroxine   100 mcg Oral Q0600   lidocaine   1 patch Transdermal Q24H   mouth rinse  15 mL Mouth Rinse 4 times per day   polyethylene glycol  17 g Oral BID   senna-docusate  1 tablet Oral BID   sodium bicarbonate   1,300 mg Oral BID   sodium bicarbonate   650 mg Oral QHS   Continuous Infusions:  cefTRIAXone  (ROCEPHIN )  IV 200 mL/hr at 07/11/23 1919    LOS: 10 days   Alejandro Marker, DO [facility] Available via Epic secure chat 7am-7pm After these hours, please refer to coverage provider listed on [email or web address] 07/12/2023, 7:43 PM

## 2023-07-12 NOTE — Plan of Care (Signed)
   Problem: Education: Goal: Knowledge of General Education information will improve Description Including pain rating scale, medication(s)/side effects and non-pharmacologic comfort measures Outcome: Progressing

## 2023-07-13 DIAGNOSIS — S82001A Unspecified fracture of right patella, initial encounter for closed fracture: Secondary | ICD-10-CM | POA: Diagnosis not present

## 2023-07-13 LAB — COMPREHENSIVE METABOLIC PANEL
ALT: 30 U/L (ref 0–44)
AST: 53 U/L — ABNORMAL HIGH (ref 15–41)
Albumin: 2.9 g/dL — ABNORMAL LOW (ref 3.5–5.0)
Alkaline Phosphatase: 77 U/L (ref 38–126)
Anion gap: 10 (ref 5–15)
BUN: 81 mg/dL — ABNORMAL HIGH (ref 8–23)
CO2: 19 mmol/L — ABNORMAL LOW (ref 22–32)
Calcium: 9 mg/dL (ref 8.9–10.3)
Chloride: 108 mmol/L (ref 98–111)
Creatinine, Ser: 3.29 mg/dL — ABNORMAL HIGH (ref 0.44–1.00)
GFR, Estimated: 13 mL/min — ABNORMAL LOW (ref >60)
Glucose, Bld: 152 mg/dL — ABNORMAL HIGH (ref 70–99)
Potassium: 5.1 mmol/L (ref 3.5–5.1)
Sodium: 137 mmol/L (ref 135–145)
Total Bilirubin: 0.5 mg/dL (ref 0.0–1.2)
Total Protein: 6.7 g/dL (ref 6.5–8.1)

## 2023-07-13 LAB — CBC WITH DIFFERENTIAL/PLATELET
Abs Immature Granulocytes: 0.05 10*3/uL (ref 0.00–0.07)
Basophils Absolute: 0 10*3/uL (ref 0.0–0.1)
Basophils Relative: 1 %
Eosinophils Absolute: 0.2 10*3/uL (ref 0.0–0.5)
Eosinophils Relative: 2 %
HCT: 26.2 % — ABNORMAL LOW (ref 36.0–46.0)
Hemoglobin: 8.3 g/dL — ABNORMAL LOW (ref 12.0–15.0)
Immature Granulocytes: 1 %
Lymphocytes Relative: 13 %
Lymphs Abs: 1.2 10*3/uL (ref 0.7–4.0)
MCH: 28.2 pg (ref 26.0–34.0)
MCHC: 31.7 g/dL (ref 30.0–36.0)
MCV: 89.1 fL (ref 80.0–100.0)
Monocytes Absolute: 1 10*3/uL (ref 0.1–1.0)
Monocytes Relative: 11 %
Neutro Abs: 6.4 10*3/uL (ref 1.7–7.7)
Neutrophils Relative %: 72 %
Platelets: 204 10*3/uL (ref 150–400)
RBC: 2.94 MIL/uL — ABNORMAL LOW (ref 3.87–5.11)
RDW: 14.7 % (ref 11.5–15.5)
WBC: 8.9 10*3/uL (ref 4.0–10.5)
nRBC: 0 % (ref 0.0–0.2)

## 2023-07-13 LAB — IRON AND TIBC
Iron: 39 ug/dL (ref 28–170)
Saturation Ratios: 15 % (ref 10.4–31.8)
TIBC: 255 ug/dL (ref 250–450)
UIBC: 216 ug/dL

## 2023-07-13 LAB — GLUCOSE, CAPILLARY
Glucose-Capillary: 135 mg/dL — ABNORMAL HIGH (ref 70–99)
Glucose-Capillary: 139 mg/dL — ABNORMAL HIGH (ref 70–99)
Glucose-Capillary: 242 mg/dL — ABNORMAL HIGH (ref 70–99)

## 2023-07-13 LAB — MAGNESIUM: Magnesium: 1.9 mg/dL (ref 1.7–2.4)

## 2023-07-13 LAB — RETICULOCYTES
Immature Retic Fract: 19.3 % — ABNORMAL HIGH (ref 2.3–15.9)
RBC.: 2.91 MIL/uL — ABNORMAL LOW (ref 3.87–5.11)
Retic Count, Absolute: 42.8 10*3/uL (ref 19.0–186.0)
Retic Ct Pct: 1.5 % (ref 0.4–3.1)

## 2023-07-13 LAB — VITAMIN B12: Vitamin B-12: 1201 pg/mL — ABNORMAL HIGH (ref 180–914)

## 2023-07-13 LAB — FERRITIN: Ferritin: 160 ng/mL (ref 11–307)

## 2023-07-13 LAB — FOLATE: Folate: 32 ng/mL (ref >5.9)

## 2023-07-13 LAB — PHOSPHORUS: Phosphorus: 4.3 mg/dL (ref 2.5–4.6)

## 2023-07-13 MED ORDER — SODIUM BICARBONATE 650 MG PO TABS: 1300.0000 mg | ORAL_TABLET | Freq: Two times a day (BID) | ORAL | Status: DC

## 2023-07-13 NOTE — Plan of Care (Signed)
  Problem: Clinical Measurements: Goal: Ability to maintain clinical measurements within normal limits will improve Outcome: Progressing   Problem: Pain Management: Goal: General experience of comfort will improve Outcome: Progressing   Problem: Safety: Goal: Ability to remain free from injury will improve Outcome: Progressing

## 2023-07-13 NOTE — Progress Notes (Signed)
 Pt discharged via [facility] transport. Stable at this time with knee immobilizer in place. Report given to [facility] @ [address]

## 2023-07-13 NOTE — Progress Notes (Signed)
 Occupational Therapy Treatment Patient Details Name: Molly Cobb MRN: [id number] DOB: 07-06-1931 Today's Date: 07/13/2023   History of present illness Patient is a 89 year old female who presented on 1/3 with pain in R knee and face after fall. Patient was found to have mild patellar fracture, UTI, and acute metabolic encephalopathy. PMH: CKD, CAD, depression, type II DM, gout, hypertension, hypothyroidism, severe aortic stenosis with history of TAVR, history of pacemaker placement, hypothyroidism.   OT comments  Patient was lethargic during session impacting participation. Patient was +2 to advance and sitting EOB. Patient will benefit from continued inpatient follow up therapy, <3 hours/day. Patient's discharge plan remains appropriate at this time. OT will continue to follow acutely.        If plan is discharge home, recommend the following:  Assistance with cooking/housework;Direct supervision/assist for medications management;Assist for transportation;Help with stairs or ramp for entrance;Direct supervision/assist for financial management;A lot of help with bathing/dressing/bathroom;A lot of help with walking and/or transfers   Equipment Recommendations  None recommended by OT       Precautions / Restrictions Precautions Precautions: Fall Required Braces or Orthoses: Other Brace Other Brace: bledsoe Restrictions RLE Weight Bearing Per Provider Order: Weight bearing as tolerated Other Position/Activity Restrictions: with bledsoe brace       Mobility Bed Mobility Overal bed mobility: Needs Assistance Bed Mobility: Supine to Sit, Sit to Supine     Supine to sit: +2 for physical assistance, HOB elevated, Used rails, Max assist Sit to supine: +2 for physical assistance, Max assist   General bed mobility comments: patient lethargic with attempts to get to EOB with patient upon sitting EOB asking to sleep more. returned to bed.              ADL either performed or assessed  with clinical judgement   ADL Overall ADL's : Needs assistance/impaired                           Toilet Transfer Details (indicate cue type and reason): patient was +2 to advance to EOB with increased time. patient lethargic with attempts to sit EOB with patient trying to lie back down in bed multiple times. patient asking to get back in bed declining to participate further at this time +2 to transition back to supine in bed.                  Cognition Arousal: Lethargic Behavior During Therapy: WFL for tasks assessed/performed Overall Cognitive Status: History of cognitive impairments - at baseline         General Comments: patient was lethargic with daughter present attempting to encouarge patient to participate.                   Pertinent Vitals/ Pain       Pain Assessment Pain Assessment: Faces Faces Pain Scale: Hurts little more Pain Location: R knee Pain Descriptors / Indicators: Grimacing, Discomfort Pain Intervention(s): Limited activity within patient's tolerance, Monitored during session         Frequency  Min 1X/week        Progress Toward Goals  OT Goals(current goals can now be found in the care plan section)  Progress towards OT goals: Not progressing toward goals - comment     Plan         AM-PAC OT "6 Clicks" Daily Activity     Outcome Measure   Help from another person eating meals?: A  Little Help from another person taking care of personal grooming?: A Little Help from another person toileting, which includes using toliet, bedpan, or urinal?: A Lot Help from another person bathing (including washing, rinsing, drying)?: A Lot Help from another person to put on and taking off regular upper body clothing?: A Little Help from another person to put on and taking off regular lower body clothing?: A Lot 6 Click Score: 15    End of Session Equipment Utilized During Treatment: Gait belt;Rolling walker (2 wheels)  OT Visit  Diagnosis: Unsteadiness on feet (R26.81);Other abnormalities of gait and mobility (R26.89);Pain   Activity Tolerance Patient tolerated treatment well   Patient Left in bed;with bed alarm set;with call bell/phone within reach   Nurse Communication Mobility status        Time: 1026-1040 OT Time Calculation (min): 14 min  Charges: OT General Charges $OT Visit: 1 Visit OT Treatments $Therapeutic Activity: 8-22 mins  Molly Heckler, MS Acute Rehabilitation Department Office# [phone]   Molly Cobb 07/13/2023, 1:00 PM

## 2023-07-13 NOTE — TOC Transition Note (Signed)
 Transition of Care [facility]) - Discharge Note  Patient Details  Name: Molly Cobb MRN: [id number] Date of Birth: Nov 14, 1931  Transition of Care [facility]) CM/SW Contact:  Zenon Hilda, LCSW Phone Number: 07/13/2023, 3:24 PM  Clinical Narrative: Patient is medically stable to discharge to [facility] today. Daughter to continue to work with Anda Bamberg at [facility] and [employer] regarding a possible transfer. Patient will go to room 106 and the number for report is [phone]. Discharge summary, discharge orders, and SNF transfer report faxed to facility in hub. Medical necessity form done; PTAR scheduled. Discharge packet completed. CSW notified daughter of transportation being set up. RN updated. TOC signing off.  Final next level of care: Skilled Nursing Facility Barriers to Discharge: Barriers Resolved  Patient Goals and CMS Choice Patient states their goals for this hospitalization and ongoing recovery are:: return to ILF following SNF rehab CMS Medicare.gov Compare Post Acute Care list provided to:: Patient Represenative (must comment) Susannah Epp (daughter)) Choice offered to / list presented to : Adult Children  Discharge Placement Existing PASRR number confirmed : 07/06/23          Patient chooses bed at: [facility] Patient to be transferred to facility by: PTAR Name of family member notified: Susannah Epp (daughter) Patient and family notified of of transfer: 07/13/23  Discharge Plan and Services Additional resources added to the After Visit Summary for   In-house Referral: Clinical Social Work      DME Arranged: N/A DME Agency: NA  Social Drivers of Health (SDOH) Interventions SDOH Screenings   Food Insecurity: No Food Insecurity (07/02/2023)  Housing: Low Risk  (07/02/2023)  Transportation Needs: No Transportation Needs (07/02/2023)  Utilities: Not At Risk (07/02/2023)  Financial Resource Strain: Low Risk  (01/11/2022)   [facility] prior to 08/28/2022.  Physical Activity: Unknown (01/11/2022)   [facility] from [facility], [facility] visits prior to 08/28/2022., [facility] visits prior to 08/28/2022., [facility] visits prior to 08/28/2022.  Social Connections: Unknown (07/02/2023)  Stress: Stress Concern Present (01/11/2022)   [facility] to [facility]/28/[facility]22[facility] visits prior to 08/28/2022.  Tobacco Use: Low Risk  (07/02/2023)   Readmission Risk Interventions    07/05/2023    2:24 PM 09/24/2021   11:03 AM  Readmission Risk Prevention Plan  Transportation Screening Complete Complete  PCP or Specialist Appt within 3-5 Days Complete Complete  HRI or Home Care Consult Complete Complete  Social Work Consult for Recovery Care Planning/Counseling Complete Complete  Palliative Care Screening Not Applicable Not Applicable  Medication Review Oceanographer) Complete Complete

## 2023-07-13 NOTE — Discharge Summary (Signed)
 Physician Discharge Summary  Molly Cobb ZOX:[id number] DOB: 1932/02/28 DOA: 07/01/2023  PCP: Sindy Dues, MD  Admit date: 07/01/2023 Discharge date: 07/13/2023 Discharging to: SNF Recommendations for Outpatient Follow-up:  F/u with orthopedic surgery in about 1 wk for repeat xrays  Consults:  Orthopedic surgery Nephrology   Discharge Diagnoses:   Principal Problem:   Patella fracture Active Problems:   Hypothyroidism (acquired)   Chronic kidney disease (CKD), stage V (HCC)   UTI (urinary tract infection)   Acute metabolic encephalopathy   Metabolic acidosis   Hyperkalemia     Hospital Course:  This is an 89 year old female with chronic kidney disease stage V, severe aortic stenosis status post TAVR, depression, diabetes mellitus, hypertension and hypothyroidism who presented to the hospital from [facility] after a fall. Per notes, it appears that the patient was also more confused prior to coming to the hospital. She was found to have a right patellar fracture  Principal Problem:   Patella fracture, right sided -Nondisplaced right-sided patellar fracture - 1/5 Evaluated by orthopedic surgery, Dr. Lucienne Ryder, recommended Bledsoe brace locked in full extension for at least 6 weeks-weightbearing as tolerated - Recommended to follow-up in the office in 1 to 2 weeks for repeat x-ray  Active Problems: UTI- e coli - treated with Ceftriaxone   Acute metabolic encephalopathy? -According to the history, she was apparently confused at the facility prior to coming in - In the hospital the patient has had some waxing and waning episodes of confusion along with lethargy-it appears that she wakes up later in the afternoon and is usually more oriented at this time - Seems that she has been eating 75 to 100% of her meals when she is eventually awake -Requested an assessment by nephrology team for uremia and possible need for dialysis since she has CKD 5-she has been urinating,  albeit, sometimes incontinent-at this point they do not feel that she needs dialysis  Chronic kidney disease stage V with metabolic acidosis - Continue bicarb-2 tabs in a.m. and 1 in p.m. - She has an outpatient nephrologist in [place]  Morbid obesity  Body mass index is 30.11 kg/m.  Diabetes mellitus - Can continue Tradjenta  but would hold glipizide  -sugars are not significantly elevated   Acute thrombocytopenia - Platelets dropped to about 130 but have subsequently improved to normal   Hypertension - Amlodipine  and metoprolol  held-currently on carvedilol    Discharge Instructions  Discharge Instructions     Diet - low sodium heart healthy   Complete by: As directed    Increase activity slowly   Complete by: As directed    No wound care   Complete by: As directed       Allergies as of 07/13/2023       Reactions   Oxybutynin Other (See Comments)   Caused hallucinations   Oysters [shellfish Allergy] Nausea And Vomiting   Sulfa Antibiotics Nausea And Vomiting        Medication List     STOP taking these medications    amLODipine  10 MG tablet Commonly known as: NORVASC    glipiZIDE  5 MG 24 hr tablet Commonly known as: GLUCOTROL  XL   hydrOXYzine  10 MG tablet Commonly known as: ATARAX    metoprolol  tartrate 25 MG tablet Commonly known as: LOPRESSOR        TAKE these medications    aspirin  81 MG chewable tablet Chew 1 tablet (81 mg total) by mouth daily.   atorvastatin  40 MG tablet Commonly known as: LIPITOR Take 1 tablet (40 mg  total) by mouth every morning.   bisacodyl  10 MG suppository Commonly known as: DULCOLAX If not relieved by MOM, give 10 mg Bisacodyl  suppositiory rectally X 1 dose in 24 hours as needed   carvedilol  12.5 MG tablet Commonly known as: COREG  Take 12.5 mg by mouth 2 (two) times daily with a meal.   conjugated estrogens  vaginal cream Commonly known as: PREMARIN  Place 1 Applicatorful vaginally every Monday, Wednesday, and  Friday.   COQ-10 PO Take 1 capsule by mouth every morning.   escitalopram  10 MG tablet Commonly known as: LEXAPRO  Take 1 tablet (10 mg total) by mouth every morning.   ezetimibe  10 MG tablet Commonly known as: ZETIA  Take 1 tablet (10 mg total) by mouth every morning.   isosorbide  mononitrate 30 MG 24 hr tablet Commonly known as: IMDUR  Take 1 tablet (30 mg total) by mouth every morning.   levothyroxine  100 MCG tablet Commonly known as: SYNTHROID  Take 1 tablet (100 mcg total) by mouth daily at 6 (six) AM. What changed:  medication strength how much to take when to take this   lidocaine  5 % Commonly known as: Lidoderm  Place 1 patch onto the skin daily. Remove & Discard patch within 12 hours or as directed by MD What changed:  when to take this reasons to take this   Melatonin 5 MG Chew Chew 1 tablet by mouth at bedtime.   MILK OF MAGNESIA PO Take by mouth. If no BM in 3 days, give 30 cc Milk of Magnesium  p.o. x 1 dose in 24 hours as needed   multivitamin with minerals Tabs tablet Take 1 tablet by mouth daily.   nitroGLYCERIN  0.4 MG SL tablet Commonly known as: Nitrostat  Place 1 tablet (0.4 mg total) under the tongue every 5 (five) minutes as needed for chest pain.   NON FORMULARY Diet:Heart Healthy/CCD/1200 fluid restriction   pantoprazole  40 MG tablet Commonly known as: PROTONIX  Take 1 tablet (40 mg total) by mouth daily.   RA SALINE ENEMA RE If not relieved by Biscodyl suppository, give disposable Saline Enema rectally X 1 dose/24 hrs as needed   sodium bicarbonate  650 MG tablet Take 1 tablet (650 mg total) by mouth every morning. What changed:  how much to take when to take this additional instructions   sodium bicarbonate  650 MG tablet Take 2 tablets (1,300 mg total) by mouth 2 (two) times daily. What changed: You were already taking a medication with the same name, and this prescription was added. Make sure you understand how and when to take each.    Tradjenta  5 MG Tabs tablet Generic drug: linagliptin  Take 1 tablet (5 mg total) by mouth every morning.        Contact information for follow-up providers     Arnie Lao, MD. Schedule an appointment as soon as possible for a visit in 1 week(s).   Specialty: Orthopedic Surgery Contact information: [address] [place] [address] [phone]         Sindy Dues, MD Follow up in 1 week(s).   Specialty: Geriatric Medicine Contact information: [address] [place] [address] [phone]              Contact information for after-discharge care     Destination     [facility] Preferred SNF .   Service: Skilled Nursing Contact information: [address] [place]  [address] [phone]  The results of significant diagnostics from this hospitalization (including imaging, microbiology, ancillary and laboratory) are listed below for reference.    DG CHEST PORT 1 VIEW Result Date: 07/12/2023 CLINICAL DATA:  Shortness of breath EXAM: PORTABLE CHEST 1 VIEW COMPARISON:  07/10/2023 FINDINGS: Left pacer remains in place, unchanged. Mild cardiomegaly. No confluent airspace opacities, effusions or edema. No acute bony abnormality. Old healed proximal left humeral fracture with deformity. IMPRESSION: Mild cardiomegaly.  No active disease. Electronically Signed   By: Janeece Mechanic M.D.   On: 07/12/2023 09:25   EEG adult Result Date: 07/11/2023 Arleene Lack, MD     07/11/2023  3:20 PM Patient Name: Molly Cobb MRN: [id number] Epilepsy Attending: Arleene Lack Referring Physician/Provider: Eveline Hipps, DO Date: 07/11/2023 Duration: 23.47 mins Patient history: 89yo F with ams getting eeg to evaluate for seizure Level of alertness: Awake, asleep AEDs during EEG study: None Technical aspects: This EEG study was done with scalp electrodes positioned according to the 10-20  International system of electrode placement. Electrical activity was reviewed with band pass filter of 1-70Hz , sensitivity of 7 uV/mm, display speed of 77mm/sec with a 60Hz  notched filter applied as appropriate. EEG data were recorded continuously and digitally stored.  Video monitoring was available and reviewed as appropriate. Description: The posterior dominant rhythm consists of 7 Hz activity of moderate voltage (25-35 uV) seen predominantly in posterior head regions, symmetric and reactive to eye opening and eye closing. Sleep was characterized by vertex waves, sleep spindles (12 to 14 Hz), maximal frontocentral region. EEG showed continuous generalized 5 to 7 Hz theta slowing.Hyperventilation and photic stimulation were not performed.   ABNORMALITY - Continuous slow, generalized IMPRESSION: This study is suggestive of mild to moderate diffuse encephalopathy. No seizures or epileptiform discharges were seen throughout the recording. Arleene Lack   DG CHEST PORT 1 VIEW Result Date: 07/10/2023 CLINICAL DATA:  Short of breath EXAM: PORTABLE CHEST 1 VIEW COMPARISON:  07/09/2023 FINDINGS: LEFT-sided pacer overlies normal cardiac silhouette. Aortic valve noted. Lungs are clear. No acute osseous abnormality. IMPRESSION: No acute cardiopulmonary process. Electronically Signed   By: Deboraha Fallow M.D.   On: 07/10/2023 08:55   DG CHEST PORT 1 VIEW Result Date: 07/09/2023 CLINICAL DATA:  Shortness of breath EXAM: PORTABLE CHEST 1 VIEW COMPARISON:  07/07/2023 FINDINGS: Left-sided implanted cardiac device remains in place. The heart size and mediastinal contours are stable. Slightly increasing interstitial opacity at the right lung base. No pleural effusion or pneumothorax. The visualized skeletal structures are unremarkable. IMPRESSION: Slightly increasing interstitial opacity at the right lung base, which may represent atelectasis versus developing infiltrate. Electronically Signed   By: Leverne Reading D.O.    On: 07/09/2023 16:45   CT HEAD WO CONTRAST ( ) Result Date: 07/09/2023 CLINICAL DATA:  Mental status change, unknown cause. EXAM: CT HEAD WITHOUT CONTRAST TECHNIQUE: Contiguous axial images were obtained from the base of the skull through the vertex without intravenous contrast. RADIATION DOSE REDUCTION: This exam was performed according to the departmental dose-optimization program which includes automated exposure control, adjustment of the mA and/or kV according to patient size and/or use of iterative reconstruction technique. COMPARISON:  CT head without contrast 07/01/2023 FINDINGS: Brain: Moderate atrophy and white matter disease is similar the prior study. No acute infarct, hemorrhage, or mass lesion is present. The ventricles are of normal size. No significant extraaxial fluid collection is present. The brainstem and cerebellum are within normal limits. A relatively empty sella is present. Vascular: Atherosclerotic calcifications are present  within the cavernous internal carotid arteries bilaterally and at the dural margin of both vertebral arteries. No hyperdense vessel is present. Skull: Calvarium is intact. No focal lytic or blastic lesions are present. No significant extracranial soft tissue lesion is present. Sinuses/Orbits: The paranasal sinuses and mastoid air cells are clear. Bilateral lens replacements are noted. Globes and orbits are otherwise unremarkable. IMPRESSION: 1. No acute intracranial abnormality or significant interval change. 2. Moderate atrophy and white matter disease is similar the prior study and likely reflects the sequela of chronic microvascular ischemia. Electronically Signed   By: Audree Leas M.D.   On: 07/09/2023 10:31   DG Chest Port 1 View Result Date: 07/07/2023 CLINICAL DATA:  89 year old female with shortness of breath and chest pain. EXAM: PORTABLE CHEST 1 VIEW COMPARISON:  Portable chest 07/01/2023 and earlier. FINDINGS: Portable AP semi upright view at  1010 hours. Stable cardiac size and mediastinal contours. Previous TAVR. Stable left chest pacemaker. Calcified aortic atherosclerosis. Visualized tracheal air column is within normal limits. Lung volumes are stable. Allowing for portable technique the lungs are clear. No pneumothorax. Chronic appearing proximal left humerus fracture. Negative visible bowel gas. IMPRESSION: No acute cardiopulmonary abnormality. Electronically Signed   By: Marlise Simpers M.D.   On: 07/07/2023 12:44   DG Hand Complete Right Result Date: 07/02/2023 CLINICAL DATA:  Swelling with bone deformity EXAM: RIGHT HAND - COMPLETE 3 VIEW COMPARISON:  None Available. FINDINGS: There is no evidence of fracture or dislocation. Generalized osteopenia and degenerative interphalangeal joint narrowing, greatest at the fifth DIP joint. IMPRESSION: No acute or focal finding. Electronically Signed   By: Ronnette Coke M.D.   On: 07/02/2023 05:51   CT Head Wo Contrast Result Date: 07/01/2023 CLINICAL DATA:  Fall EXAM: CT HEAD WITHOUT CONTRAST CT MAXILLOFACIAL WITHOUT CONTRAST CT CERVICAL SPINE WITHOUT CONTRAST TECHNIQUE: Multidetector CT imaging of the head, cervical spine, and maxillofacial structures were performed using the standard protocol without intravenous contrast. Multiplanar CT image reconstructions of the cervical spine and maxillofacial structures were also generated. RADIATION DOSE REDUCTION: This exam was performed according to the departmental dose-optimization program which includes automated exposure control, adjustment of the mA and/or kV according to patient size and/or use of iterative reconstruction technique. COMPARISON:  CT brain cervical and facial 05/13/2022 FINDINGS: CT HEAD FINDINGS Brain: No acute territorial infarction, hemorrhage or intracranial mass. Atrophy and chronic small vessel ischemic changes of the white matter. Small chronic right thalamic infarcts. Stable ventricle size. Vascular: No hyperdense vessels.  Carotid  vascular calcification Skull: Normal. Negative for fracture or focal lesion. Other: None CT MAXILLOFACIAL FINDINGS Osseous: Mastoid air cells are clear. No mandibular fracture. Pterygoid plates and zygomatic arches are intact. No acute nasal bone fracture Orbits: Negative. No traumatic or inflammatory finding. Sinuses: Clear. Soft tissues: Right chin soft tissue laceration or skin wound. Stable 11 mm left parotid node or nodule. CT CERVICAL SPINE FINDINGS Alignment: Straightening of the cervical spine. No subluxation. Facet alignment is within normal limits Skull base and vertebrae: No acute fracture. No primary bone lesion or focal pathologic process. Soft tissues and spinal canal: No prevertebral fluid or swelling. No visible canal hematoma. Disc levels: Multilevel degenerative change. Mild disc space narrowing C5-C6 and C6-C7. Mild multilevel facet degenerative changes. Upper chest: Negative. Other: None IMPRESSION: 1. No CT evidence for acute intracranial abnormality. Atrophy and chronic small vessel ischemic changes of the white matter. 2. Straightening of the cervical spine with degenerative changes. No acute osseous abnormality. 3. Right chin soft tissue laceration  or skin wound. No acute facial bone fracture Electronically Signed   By: Esmeralda Hedge M.D.   On: 07/01/2023 22:11   CT Maxillofacial Wo Contrast Result Date: 07/01/2023 CLINICAL DATA:  Fall EXAM: CT HEAD WITHOUT CONTRAST CT MAXILLOFACIAL WITHOUT CONTRAST CT CERVICAL SPINE WITHOUT CONTRAST TECHNIQUE: Multidetector CT imaging of the head, cervical spine, and maxillofacial structures were performed using the standard protocol without intravenous contrast. Multiplanar CT image reconstructions of the cervical spine and maxillofacial structures were also generated. RADIATION DOSE REDUCTION: This exam was performed according to the departmental dose-optimization program which includes automated exposure control, adjustment of the mA and/or kV according  to patient size and/or use of iterative reconstruction technique. COMPARISON:  CT brain cervical and facial 05/13/2022 FINDINGS: CT HEAD FINDINGS Brain: No acute territorial infarction, hemorrhage or intracranial mass. Atrophy and chronic small vessel ischemic changes of the white matter. Small chronic right thalamic infarcts. Stable ventricle size. Vascular: No hyperdense vessels.  Carotid vascular calcification Skull: Normal. Negative for fracture or focal lesion. Other: None CT MAXILLOFACIAL FINDINGS Osseous: Mastoid air cells are clear. No mandibular fracture. Pterygoid plates and zygomatic arches are intact. No acute nasal bone fracture Orbits: Negative. No traumatic or inflammatory finding. Sinuses: Clear. Soft tissues: Right chin soft tissue laceration or skin wound. Stable 11 mm left parotid node or nodule. CT CERVICAL SPINE FINDINGS Alignment: Straightening of the cervical spine. No subluxation. Facet alignment is within normal limits Skull base and vertebrae: No acute fracture. No primary bone lesion or focal pathologic process. Soft tissues and spinal canal: No prevertebral fluid or swelling. No visible canal hematoma. Disc levels: Multilevel degenerative change. Mild disc space narrowing C5-C6 and C6-C7. Mild multilevel facet degenerative changes. Upper chest: Negative. Other: None IMPRESSION: 1. No CT evidence for acute intracranial abnormality. Atrophy and chronic small vessel ischemic changes of the white matter. 2. Straightening of the cervical spine with degenerative changes. No acute osseous abnormality. 3. Right chin soft tissue laceration or skin wound. No acute facial bone fracture Electronically Signed   By: Esmeralda Hedge M.D.   On: 07/01/2023 22:11   CT Cervical Spine Wo Contrast Result Date: 07/01/2023 CLINICAL DATA:  Fall EXAM: CT HEAD WITHOUT CONTRAST CT MAXILLOFACIAL WITHOUT CONTRAST CT CERVICAL SPINE WITHOUT CONTRAST TECHNIQUE: Multidetector CT imaging of the head, cervical spine, and  maxillofacial structures were performed using the standard protocol without intravenous contrast. Multiplanar CT image reconstructions of the cervical spine and maxillofacial structures were also generated. RADIATION DOSE REDUCTION: This exam was performed according to the departmental dose-optimization program which includes automated exposure control, adjustment of the mA and/or kV according to patient size and/or use of iterative reconstruction technique. COMPARISON:  CT brain cervical and facial 05/13/2022 FINDINGS: CT HEAD FINDINGS Brain: No acute territorial infarction, hemorrhage or intracranial mass. Atrophy and chronic small vessel ischemic changes of the white matter. Small chronic right thalamic infarcts. Stable ventricle size. Vascular: No hyperdense vessels.  Carotid vascular calcification Skull: Normal. Negative for fracture or focal lesion. Other: None CT MAXILLOFACIAL FINDINGS Osseous: Mastoid air cells are clear. No mandibular fracture. Pterygoid plates and zygomatic arches are intact. No acute nasal bone fracture Orbits: Negative. No traumatic or inflammatory finding. Sinuses: Clear. Soft tissues: Right chin soft tissue laceration or skin wound. Stable 11 mm left parotid node or nodule. CT CERVICAL SPINE FINDINGS Alignment: Straightening of the cervical spine. No subluxation. Facet alignment is within normal limits Skull base and vertebrae: No acute fracture. No primary bone lesion or focal pathologic process. Soft tissues  and spinal canal: No prevertebral fluid or swelling. No visible canal hematoma. Disc levels: Multilevel degenerative change. Mild disc space narrowing C5-C6 and C6-C7. Mild multilevel facet degenerative changes. Upper chest: Negative. Other: None IMPRESSION: 1. No CT evidence for acute intracranial abnormality. Atrophy and chronic small vessel ischemic changes of the white matter. 2. Straightening of the cervical spine with degenerative changes. No acute osseous abnormality. 3.  Right chin soft tissue laceration or skin wound. No acute facial bone fracture Electronically Signed   By: Esmeralda Hedge M.D.   On: 07/01/2023 22:11   DG Pelvis 1-2 Views Result Date: 07/01/2023 CLINICAL DATA:  Recent fall with pelvic pain, initial encounter EXAM: PELVIS - 1 VIEW COMPARISON:  None Available. FINDINGS: Pelvic ring is intact. Postsurgical changes are noted in the proximal right femur. Acute fracture or dislocation is seen. No soft tissue abnormality is noted. IMPRESSION: No acute abnormality noted. Electronically Signed   By: Violeta Grey M.D.   On: 07/01/2023 21:41   DG Knee Complete 4 Views Right Result Date: 07/01/2023 CLINICAL DATA:  Recent fall with right knee pain, initial encounter EXAM: RIGHT KNEE - COMPLETE 4+ VIEW COMPARISON:  None FINDINGS: Medullary rod is noted in the distal right femur. Transverse fracture through the midportion of the patella is noted with approximately 8 mm displacement of the fracture fragments. No other fracture or dislocation is seen. No soft tissue changes are noted. IMPRESSION: Mid patellar fracture. Electronically Signed   By: Violeta Grey M.D.   On: 07/01/2023 21:40   DG Chest Portable 1 View Result Date: 07/01/2023 CLINICAL DATA:  Recent fall with chest pain, initial encounter EXAM: PORTABLE CHEST 1 VIEW COMPARISON:  05/13/2023 FINDINGS: Cardiac shadow is enlarged. Pacing device is again seen. Findings of prior TAVR are noted. Aortic calcifications are seen. The lungs are clear bilaterally. No bony abnormality is noted. IMPRESSION: No acute abnormality noted. Electronically Signed   By: Violeta Grey M.D.   On: 07/01/2023 21:39   Labs:   Basic Metabolic Panel: Recent Labs  Lab 07/09/23 0425 07/10/23 0358 07/11/23 0459 07/12/23 0500 07/13/23 0514  NA 136 134* 138 135 137  K 4.6 4.5 5.0 5.0 5.1  CL 105 107 109 109 108  CO2 20* 17* 19* 17* 19*  GLUCOSE 130* 135* 199* 146* 152*  BUN 80* 81* 83* 79* 81*  CREATININE 3.42* 3.06* 3.14* 3.09*  3.29*  CALCIUM  8.7* 8.3* 8.6* 8.4* 9.0  MG 1.9 2.0 2.0 1.9 1.9  PHOS 4.9* 4.7* 5.3* 4.3 4.3     CBC: Recent Labs  Lab 07/09/23 0425 07/10/23 0358 07/11/23 0459 07/12/23 0500 07/13/23 0514  WBC 13.0* 10.0 8.4 9.4 8.9  NEUTROABS 9.7* 7.1 5.9 6.8 6.4  HGB 8.3* 8.4* 8.1* 7.7* 8.3*  HCT 26.0* 26.8* 25.2* 24.5* 26.2*  MCV 89.3 90.2 90.6 90.1 89.1  PLT 165 164 164 182 204         SIGNED:   Sedalia Dacosta, MD  [facility] 07/13/2023, 3:01 PM

## 2023-07-13 NOTE — Progress Notes (Signed)
 Physical Therapy Treatment Patient Details Name: Molly Cobb MRN: [id number] DOB: May 12, 1932 Today's Date: 07/13/2023   History of Present Illness Patient is a 89 year old female who presented on 1/3 with pain in R knee and face after fall. Patient was found to have mild patellar fracture, UTI, and acute metabolic encephalopathy. PMH: CKD, CAD, depression, type II DM, gout, hypertension, hypothyroidism, severe aortic stenosis with history of TAVR, history of pacemaker placement, hypothyroidism.    PT Comments  Today's PT session focused on bed mobility and transfers. Pt performed supine to/from sit transfers with up to MOD A+2. Able to stand x5 throughout session and take side shuffles x3 to L with MOD A+2 and increased time. Pt limited with further mobility due to pain in R knee with weight bearing. Pt will benefit from continued skilled PT services to maximize functional mobility and progress independence.    If plan is discharge home, recommend the following: A lot of help with walking and/or transfers;A lot of help with bathing/dressing/bathroom;Assistance with cooking/housework;Assist for transportation;Help with stairs or ramp for entrance   Can travel by private vehicle     No  Equipment Recommendations  None recommended by PT    Recommendations for Other Services       Precautions / Restrictions Precautions Precautions: Fall Required Braces or Orthoses: Other Brace Other Brace: bledsoe Restrictions Weight Bearing Restrictions Per Provider Order: No RLE Weight Bearing Per Provider Order: Weight bearing as tolerated Other Position/Activity Restrictions: with bledsoe brace     Mobility  Bed Mobility Overal bed mobility: Needs Assistance Bed Mobility: Supine to Sit, Sit to Supine     Supine to sit: Mod assist, +2 for physical assistance, +2 for safety/equipment, HOB elevated, Used rails Sit to supine: Max assist, +2 for safety/equipment, +2 for physical assistance    General bed mobility comments: required tactile cues for initiation then pt assisting with coming to EOB and scooting hips forward. Able to follow cues for use of bed rails and sequencing to assist with ease/efficency. Perfomed x 2 as pt soiled bed on initial return to supine. Assisted with pericare and linen change.    Transfers Overall transfer level: Needs assistance Equipment used: Rolling walker (2 wheels) Transfers: Sit to/from Stand Sit to Stand: Mod assist, +2 physical assistance, +2 safety/equipment           General transfer comment: STS x5 from EOB. Cues for hand and optimal LE placement. Able to take side shuffles on 3rd stand x3 to L. increased difficulty with WB on R LE and sliding L foot over. Able to lift R LE to reposition.    Ambulation/Gait                   Stairs             Wheelchair Mobility     Tilt Bed    Modified Rankin (Stroke Patients Only)       Balance Overall balance assessment: Needs assistance Sitting-balance support: Feet supported, Single extremity supported Sitting balance-Leahy Scale: Fair     Standing balance support: During functional activity, Bilateral upper extremity supported, Reliant on assistive device for balance Standing balance-Leahy Scale: Poor Standing balance comment: reliant on RW and external support                            Cognition Arousal: Lethargic Behavior During Therapy: WFL for tasks assessed/performed Overall Cognitive Status: History of cognitive impairments -  at baseline                                 General Comments: initially lethargic, improved with mobility. Pt agreeable to participate following education.        Exercises      General Comments        Pertinent Vitals/Pain Pain Assessment Pain Assessment: Faces Faces Pain Scale: Hurts whole lot Pain Location: R knee Pain Descriptors / Indicators: Grimacing, Discomfort, Moaning,  Guarding Pain Intervention(s): Limited activity within patient's tolerance, Monitored during session, Repositioned (RN notified of pt pain)    Home Living                          Prior Function            PT Goals (current goals can now be found in the care plan section) Acute Rehab PT Goals Patient Stated Goal: Regain IND PT Goal Formulation: With patient Time For Goal Achievement: 07/17/23 Potential to Achieve Goals: Fair Progress towards PT goals: Progressing toward goals    Frequency    Min 1X/week      PT Plan      Co-evaluation              AM-PAC PT "6 Clicks" Mobility   Outcome Measure  Help needed turning from your back to your side while in a flat bed without using bedrails?: A Lot Help needed moving from lying on your back to sitting on the side of a flat bed without using bedrails?: Total Help needed moving to and from a bed to a chair (including a wheelchair)?: Total Help needed standing up from a chair using your arms (e.g., wheelchair or bedside chair)?: Total Help needed to walk in hospital room?: Total Help needed climbing 3-5 steps with a railing? : Total 6 Click Score: 7    End of Session Equipment Utilized During Treatment: Gait belt Activity Tolerance: Patient tolerated treatment well Patient left: in bed;with call bell/phone within reach;with bed alarm set Nurse Communication: Mobility status PT Visit Diagnosis: Difficulty in walking, not elsewhere classified (R26.2);Muscle weakness (generalized) (M62.81);Pain Pain - Right/Left: Right Pain - part of body: Knee     Time: 4098-1191 PT Time Calculation (min) (ACUTE ONLY): 30 min  Charges:    $Therapeutic Activity: 23-37 mins PT General Charges $$ ACUTE PT VISIT: 1 Visit                     Faustine Hoof PT, DPT  Acute Rehabilitation Services  Office [phone]   07/13/2023, 3:36 PM

## 2023-07-13 NOTE — Consult Note (Signed)
 Molly Cobb Admit Date: 07/01/2023 07/13/2023 Molly Cobb Requesting Physician:  Renie Carver MD  Reason for Consult:  CKD5, confusion  HPI:  57F admitted to [facility] 07/01/2023 after a fall with a right patellar fracture and facial trauma.  PMH Incudes: CKD5 followed by Dr. Alvie Jolly ASCVD/CAD DM2 History of severe AS status post TAVR History of PPM Hypertension Gout  Patient has a creatinine ranging between 3.1 and 4.1.  Today is 3.29.  No recent hyperkalemia, significant azotemia.  She has mild metabolic acidosis.  Most recent visit with outpatient nephrology 06/06/2023.  Not noted and reviewed.  Longer term dialysis plan unclear.  During her time admitted she has had intermittent confusion especially first thing in the morning.  This has been extensively evaluated, she has been treated for UTI along with pneumonia.  EEG obtained without evidence of seizure activity.  Today in the afternoon she is oriented to self, location, year with me.  She can tell me a fairly detailed history of her kidney disease.  Because of her intermittent confusion there is been some question that uremia is primarily contributing and we were consulted.  Appetite is so-so.  Blood pressure has been fairly stable.  She is on room air.  Urine output appears to be fairly normal.   Creatinine, Ser (mg/dL)  Date Value  19/14/7829 3.29 (H)  07/12/2023 3.09 (H)  07/11/2023 3.14 (H)  07/10/2023 3.06 (H)  07/09/2023 3.42 (H)  07/08/2023 3.65 (H)  07/07/2023 4.25 (H)  07/06/2023 3.45 (H)  07/05/2023 4.05 (H)  07/04/2023 4.07 (H)  ] I/Os: I/O last 3 completed shifts: In: 1450.7 [P.O.:240; I.V.:997.4; IV Piggyback:213.3] Out: 1300 [Urine:1300]   ROS Balance of 12 systems is negative w/ exceptions as above  PMH  Past Medical History:  Diagnosis Date   CKD (chronic kidney disease)    a. followed by Dr. Al[facility] at [facility]    Coronary artery disease    02/23/17 PCI/DES to mRCA,  normal EF   Depression     Diabetes mellitus (HCC)    Gout    Hypertension    Hypothyroidism    Presence of permanent cardiac pacemaker    a. followed by [facility]   S/P TAVR (transcatheter aortic valve replacement) 04/12/2017   23 mm Edwards Sapien 3 transcatheter heart valve placed via percutaneous right transfemoral approach    Severe aortic stenosis    a. diagnosed 01/2017 during admission for chest pain.    PSH  Past Surgical History:  Procedure Laterality Date   APPENDECTOMY     CESAREAN SECTION     CORONARY ATHERECTOMY N/A 02/23/2017   Procedure: CORONARY ATHERECTOMY;  Surgeon: Arnoldo Lapping, MD;  Location[facility] INVASIVE CV LAB;  Service: Cardiovascular;  Laterality: N/A;   ESOPHAGOGASTRODUODENOSCOPY N/A 02/18/2017   Procedure: ESOPHAGOGASTRODUODENOSCOPY (EGD);  Surgeon: Janel Medford, MD;  Location: [facility] ENDOSCOPY;  Service: Endoscopy;  Laterality: N/A;   EYE SURGERY     FEMUR FRACTURE SURGERY     HOT HEMOSTASIS N/A 02/18/2017   Procedure: HOT HEMOSTASIS (ARGON PLASMA COAGULATION/BICAP);  Surgeon: Janel Medford, MD;  Location: [facility] ENDOSCOPY;  Service: Endoscopy;  Laterality: N/A;   IR RADIOLOGY PERIPHERAL GUIDED IV START  03/07/2017   IR US  GUIDE VASC ACCESS RIGHT  03/07/2017   RIGHT/LEFT HEART CATH AND CORONARY ANGIOGRAPHY N/A 02/16/2017   Procedure: RIGHT/LEFT HEART CATH AND CORONARY ANGIOGRAPHY;  Surgeon: Wenona Hamilton, MD;  Location: [facility] INVASIVE CV LAB;  Service: Cardiovascular;  Laterality: N/A;   ROTATOR CUFF REPAIR  TEE WITHOUT CARDIOVERSION N/A 04/12/2017   Procedure: TRANSESOPHAGEAL ECHOCARDIOGRAM (TEE);  Surgeon: Arnoldo Lapping, MD;  Location: [facility] OR;  Service: Open Heart Surgery;  Laterality: N/A;   THYROID  SURGERY     TRANSCATHETER AORTIC VALVE REPLACEMENT, TRANSFEMORAL N/A 04/12/2017   Procedure: TRANSCATHETER AORTIC VALVE REPLACEMENT, TRANSFEMORAL;  Surgeon: Arnoldo Lapping, MD;  Location: [facility] OR;  Service: Open Heart Surgery;  Laterality: N/A;   FH  Family History  Problem Relation  Age of Onset   CAD Mother        MI in her mid 48's   Stroke Mother        Light stroke   Diabetes Father    Hypertension Sister    Healthy Sister    Healthy Sister    Lung cancer Sister    Heart disease Neg Hx    SH  reports that she has never smoked. She has never used smokeless tobacco. She reports that she does not drink alcohol and does not use drugs. Allergies  Allergies  Allergen Reactions   Oxybutynin Other (See Comments)    Caused hallucinations   Oysters [Shellfish Allergy] Nausea And Vomiting   Sulfa Antibiotics Nausea And Vomiting   Home medications Prior to Admission medications   Medication Sig Start Date End Date Taking? Authorizing Provider  aspirin  81 MG chewable tablet Chew 1 tablet (81 mg total) by mouth daily. 02/25/17  Yes Sanjuanita Cruz, NP  atorvastatin  (LIPITOR) 40 MG tablet Take 1 tablet (40 mg total) by mouth every morning. 10/01/21  Yes Medina-Vargas, Monina C, NP  bisacodyl  (DULCOLAX) 10 MG suppository If not relieved by MOM, give 10 mg Bisacodyl  suppositiory rectally X 1 dose in 24 hours as needed   Yes [provider]  carvedilol  (COREG ) 12.5 MG tablet Take 12.5 mg by mouth 2 (two) times daily with a meal. 03/30/23 03/29/24 Yes [provider]  Coenzyme Q10 (COQ-10 PO) Take 1 capsule by mouth every morning.   Yes [provider]  conjugated estrogens  (PREMARIN ) vaginal cream Place 1 Applicatorful vaginally every Monday, Wednesday, and Friday. 10/02/21  Yes Medina-Vargas, Monina C, NP  escitalopram  (LEXAPRO ) 10 MG tablet Take 1 tablet (10 mg total) by mouth every morning. 10/01/21  Yes Medina-Vargas, Monina C, NP  ezetimibe  (ZETIA ) 10 MG tablet Take 1 tablet (10 mg total) by mouth every morning. 10/01/21  Yes Medina-Vargas, Monina C, NP  glipiZIDE  (GLUCOTROL  XL) 5 MG 24 hr tablet Take 5 mg by mouth daily with breakfast.   Yes [provider]  isosorbide  mononitrate (IMDUR ) 30 MG 24 hr tablet Take 1 tablet (30 mg total) by  mouth every morning. 10/01/21  Yes Medina-Vargas, Monina C, NP  levothyroxine  (SYNTHROID ) 88 MCG tablet Take 1 tablet (88 mcg total) by mouth daily before breakfast. 10/01/21  Yes Medina-Vargas, Monina C, NP  lidocaine  (LIDODERM ) 5 % Place 1 patch onto the skin daily. Remove & Discard patch within 12 hours or as directed by MD Patient taking differently: Place 1 patch onto the skin daily as needed. Remove & Discard patch within 12 hours or as directed by MD 05/31/22  Yes Lyna Sandhoff, PA-C  Magnesium  Hydroxide (MILK OF MAGNESIA PO) Take by mouth. If no BM in 3 days, give 30 cc Milk of Magnesium  p.o. x 1 dose in 24 hours as needed   Yes [provider]  Melatonin 5 MG CHEW Chew 1 tablet by mouth at bedtime.   Yes [provider]  Multiple Vitamin (MULTIVITAMIN WITH MINERALS) TABS tablet  Take 1 tablet by mouth daily.   Yes [provider]  nitroGLYCERIN  (NITROSTAT ) 0.4 MG SL tablet Place 1 tablet (0.4 mg total) under the tongue every 5 (five) minutes as needed for chest pain. 10/01/21  Yes Medina-Vargas, Monina C, NP  sodium bicarbonate  650 MG tablet Take 1 tablet (650 mg total) by mouth every morning. Patient taking differently: Take 650-1,300 mg by mouth 2 (two) times daily. Take 1 tablet (650 mg) in the morning and Take 2 tablets (1300 mg) at bedtime 10/01/21  Yes Medina-Vargas, Monina C, NP  Sodium Phosphates (RA SALINE ENEMA RE) If not relieved by Biscodyl suppository, give disposable Saline Enema rectally X 1 dose/24 hrs as needed   Yes [provider]  TRADJENTA  5 MG TABS tablet Take 1 tablet (5 mg total) by mouth every morning. 10/01/21  Yes Medina-Vargas, Monina C, NP  amLODipine  (NORVASC ) 10 MG tablet Take 1 tablet (10 mg total) by mouth every morning. Patient not taking: Reported on 07/01/2023 10/02/21   Medina-Vargas, Monina C, NP  hydrOXYzine  (ATARAX ) 10 MG tablet Take 1 tablet (10 mg total) by mouth 2 (two) times daily as needed for anxiety. Patient not taking:  Reported on 07/01/2023 10/01/21   Medina-Vargas, Monina C, NP  levothyroxine  (SYNTHROID ) 100 MCG tablet Take 1 tablet (100 mcg total) by mouth daily at 6 (six) AM. 07/06/23   Amin, Ankit C, MD  metoprolol  tartrate (LOPRESSOR ) 25 MG tablet TAKE 1/2 (ONE-HALF) TABLET BY MOUTH TWICE DAILY Patient not taking: Reported on 07/01/2023 10/01/21   Medina-Vargas, Monina C, NP  NON FORMULARY Diet:Heart Healthy/CCD/1200 fluid restriction    [provider]  pantoprazole  (PROTONIX ) 40 MG tablet Take 1 tablet (40 mg total) by mouth daily. Patient not taking: Reported on 07/01/2023 10/01/21   Medina-Vargas, Monina C, NP  sodium bicarbonate  650 MG tablet Take 2 tablets (1,300 mg total) by mouth 2 (two) times daily. 07/13/23   Rizwan, Saima, MD    Current Medications Scheduled Meds:  aspirin   81 mg Oral Daily   atorvastatin   40 mg Oral QPM   carvedilol   12.5 mg Oral BID WC   diclofenac  Sodium  2 g Topical QID   docusate sodium   100 mg Oral BID   doxycycline   100 mg Oral Q12H   escitalopram   10 mg Oral q morning   ezetimibe   10 mg Oral QPM   feeding supplement  1 Container Oral TID BM   guaiFENesin   1,200 mg Oral BID   insulin  aspart  0-9 Units Subcutaneous Q4H   isosorbide  mononitrate  30 mg Oral q morning   levothyroxine   100 mcg Oral Q0600   lidocaine   1 patch Transdermal Q24H   mouth rinse  15 mL Mouth Rinse 4 times per day   polyethylene glycol  17 g Oral BID   senna-docusate  1 tablet Oral BID   sodium bicarbonate   1,300 mg Oral BID   sodium bicarbonate   650 mg Oral QHS   Continuous Infusions:  cefTRIAXone  (ROCEPHIN )  IV 1 g (07/12/23 2036)   PRN Meds:.acetaminophen  **OR** acetaminophen , guaiFENesin , hydrALAZINE , ipratropium-albuterol , lip balm, metoprolol  tartrate, naLOXone  (NARCAN )  injection, ondansetron  (ZOFRAN ) IV, mouth rinse, senna-docusate, simethicone   CBC Recent Labs  Lab 07/11/23 0459 07/12/23 0500 07/13/23 0514  WBC 8.4 9.4 8.9  NEUTROABS 5.9 6.8 6.4  HGB 8.1* 7.7* 8.3*  HCT  25.2* 24.5* 26.2*  MCV 90.6 90.1 89.1  PLT 164 182 204   Basic Metabolic Panel Recent Labs  Lab 07/07/23 0318 07/08/23 1610  07/09/23 0425 07/10/23 0358 07/11/23 0459 07/12/23 0500 07/13/23 0514  NA 133* 133* 136 134* 138 135 137  K 4.3 4.2 4.6 4.5 5.0 5.0 5.1  CL 102 104 105 107 109 109 108  CO2 21* 20* 20* 17* 19* 17* 19*  GLUCOSE 203* 159* 130* 135* 199* 146* 152*  BUN 77* 81* 80* 81* 83* 79* 81*  CREATININE 4.25* 3.65* 3.42* 3.06* 3.14* 3.09* 3.29*  CALCIUM  7.9* 8.2* 8.7* 8.3* 8.6* 8.4* 9.0  PHOS  --   --  4.9* 4.7* 5.3* 4.3 4.3    Physical Exam  Blood pressure 136/61, pulse 70, temperature 98.4 F (36.9 C), temperature source Oral, resp. rate 20, height 5\' 3"  (1.6 m), weight 77.1 kg, SpO2 100%. GEN: Elderly female, NAD, awake, alert and oriented to self, location, year ENT: NCAT EYES: EOMI CV: Regular, normal S1 and S2 without murmur or rub PULM: Clear bilaterally on anterior auscultation, normal work of breathing ABD: Soft, nontender SKIN: No rashes or lesions EXT: No significant edema.  Assessment 78F CKD5 here with prolonged course after a fall with patellar fracture and having intermittent confusion.  Likely this is multifactorial delirium with many contributing causes, and her advanced CKD is certainly part of that.  Nevertheless I see no reason for her to initiate dialysis and hope that it will improve as her other health conditions resolved.  Additionally, I have suggested that long-term dialysis would be unlikely to provide significant health benefits if undertaken.  CKD 5, at baseline.  Follows with no other Multifactorial delirium extensive workup Fall with patellar fracture Metabolic acidosis on sodium bicarbonate  Mild to moderate anemia, likely related to 1 along with other chronic health conditions  Plan I think she is okay for discharge with ongoing follow-up with outpatient nephrology Hopefully confusion continues to improve Discussed with Dr.  Renie Carver.   Molly Cobb  07/13/2023, 4:03 PM

## 2023-07-14 LAB — CULTURE, BLOOD (ROUTINE X 2)

## 2023-07-25 ENCOUNTER — Other Ambulatory Visit (INDEPENDENT_AMBULATORY_CARE_PROVIDER_SITE_OTHER): Payer: Medicare Other

## 2023-07-25 ENCOUNTER — Ambulatory Visit: Payer: Medicare Other | Admitting: Physician Assistant

## 2023-07-25 ENCOUNTER — Encounter: Payer: Self-pay | Admitting: Physician Assistant

## 2023-07-25 DIAGNOSIS — S82001A Unspecified fracture of right patella, initial encounter for closed fracture: Secondary | ICD-10-CM

## 2023-07-25 NOTE — Progress Notes (Signed)
Office Visit Note   Patient: Molly Cobb           Date of Birth: March 30, 1932           MRN: [id number] Visit Date: 07/25/2023              Requested by: Linnell Fulling, MD [address] [place],  [place] [address] PCP: Linnell Fulling, MD   Assessment & Plan: Visit Diagnoses:  1. Closed nondisplaced fracture of right patella, unspecified fracture morphology, initial encounter     Plan: Will see him back in 2 weeks at that time we will obtain a lateral view of her right knee.  She will continue the knee brace at all times except for bathing.  Knee brace is to be locked out at full extension.  Hopefully at next visit we will begin flexion to approximately 30 degrees depending on x-ray findings.  Weightbearing as tolerated with physical therapy to work on mobility and strength right lower extremity.  Questions were encouraged and answered at length  Follow-Up Instructions: Return in about 2 weeks (around 08/08/2023) for Radiographs.   Orders:  Orders Placed This Encounter  Procedures   XR Knee 1-2 Views Right   No orders of the defined types were placed in this encounter.     Procedures: No procedures performed   Clinical Data: No additional findings.   Subjective: Chief Complaint  Patient presents with   Right Knee - Injury    HPI Molly Cobb 89 year old female who comes in today for follow-up of a right patella fracture.  Patient lives in independent living at baseline.  Currently she is at [facility] receiving therapy.  She presents today with her sister.  Currently in a Bledsoe knee brace locked in full extension.  Reports that she is able to ambulate with therapy inside her room.  She is receiving Tylenol for pain.  Past medical history positive for chronic kidney disease stage IV, type 2 diabetes severe aortic stenosis, history of cardiac pacemaker and status post transcatheter aortic valve placement.  Review of Systems Positive for right knee pain.   Otherwise negative or noncontributory.  Objective: Vital Signs: There were no vitals taken for this visit.  Physical Exam General: Pleasant female seated in wheelchair in no acute distress. Respirations: Unlabored Ortho Exam Right knee: No abnormal warmth erythema or effusion.  Right calf supple nontender.  Dorsiflexion plantarflexion ankle intact. Specialty Comments:  No specialty comments available.  Imaging: XR Knee 1-2 Views Right Result Date: 07/25/2023 Right knee 2 views: Shows minimally displaced transverse patella fracture.  No significant consolidation.  No other fractures identified.  Status post IM nail right hip intertrochanteric fracture with retained hardware seen in the distal femur    PMFS History: Patient Active Problem List   Diagnosis Date Noted   Patella fracture 07/02/2023   UTI (urinary tract infection) 07/02/2023   Acute metabolic encephalopathy 07/02/2023   Metabolic acidosis 07/02/2023   Hyperkalemia 07/02/2023   Adult failure to thrive 09/28/2021   Frequency of micturition 09/22/2021   Hyponatremia 09/21/2021   Severe aortic stenosis 04/12/2017   S/P TAVR (transcatheter aortic valve replacement) 04/12/2017   Chronic kidney disease (CKD), stage V (HCC) 03/23/2017   AVM (arteriovenous malformation) of stomach, acquired with hemorrhage    S/P placement of cardiac pacemaker 02/16/2017   Anemia in chronic renal disease 02/13/2017   Hypothyroidism (acquired) 06/29/2016   Type 2 diabetes mellitus with stage 4 chronic kidney disease, without long-term current use  of insulin (HCC) 09/09/2015   Benign hypertension with CKD (chronic kidney disease) stage IV (HCC) 08/02/2015   Past Medical History:  Diagnosis Date   CKD (chronic kidney disease)    a. followed by Dr. Janit Pagan at [facility]    Coronary artery disease    02/23/17 PCI/DES to mRCA,  normal EF   Depression    Diabetes mellitus (HCC)    Gout    Hypertension    Hypothyroidism    Presence of  permanent cardiac pacemaker    a. followed by [facility]   S/P TAVR (transcatheter aortic valve replacement) 04/12/2017   23 mm Edwards Sapien 3 transcatheter heart valve placed via percutaneous right transfemoral approach    Severe aortic stenosis    a. diagnosed 01/2017 during admission for chest pain.     Family History  Problem Relation Age of Onset   CAD Mother        MI in her mid 72's   Stroke Mother        Light stroke   Diabetes Father    Hypertension Sister    Healthy Sister    Healthy Sister    Lung cancer Sister    Heart disease Neg Hx     Past Surgical History:  Procedure Laterality Date   APPENDECTOMY     CESAREAN SECTION     CORONARY ATHERECTOMY N/A 02/23/2017   Procedure: CORONARY ATHERECTOMY;  Surgeon: Tonny Bollman, MD;  Location: [facility] INVASIVE CV LAB;  Service: Cardiovascular;  Laterality: N/A;   ESOPHAGOGASTRODUODENOSCOPY N/A 02/18/2017   Procedure: ESOPHAGOGASTRODUODENOSCOPY (EGD);  Surgeon: Rachael Fee, MD;  Location: [facility] ENDOSCOPY;  Service: Endoscopy;  Laterality: N/A;   EYE SURGERY     FEMUR FRACTURE SURGERY     HOT HEMOSTASIS N/A 02/18/2017   Procedure: HOT HEMOSTASIS (ARGON PLASMA COAGULATION/BICAP);  Surgeon: Rachael Fee, MD;  Location: [facility] ENDOSCOPY;  Service: Endoscopy;  Laterality: N/A;   IR RADIOLOGY PERIPHERAL GUIDED IV START  03/07/2017   IR US GUIDE VASC ACCESS RIGHT  03/07/2017   RIGHT/LEFT HEART CATH AND CORONARY ANGIOGRAPHY N/A 02/16/2017   Procedure: RIGHT/LEFT HEART CATH AND CORONARY ANGIOGRAPHY;  Surgeon: Iran Ouch, MD;  Location: [facility] INVASIVE CV LAB;  Service: Cardiovascular;  Laterality: N/A;   ROTATOR CUFF REPAIR     TEE WITHOUT CARDIOVERSION N/A 04/12/2017   Procedure: TRANSESOPHAGEAL ECHOCARDIOGRAM (TEE);  Surgeon: Tonny Bollman, MD;  Location: [facility] OR;  Service: Open Heart Surgery;  Laterality: N/A;   THYROID SURGERY     TRANSCATHETER AORTIC VALVE REPLACEMENT, TRANSFEMORAL N/A 04/12/2017   Procedure: TRANSCATHETER AORTIC  VALVE REPLACEMENT, TRANSFEMORAL;  Surgeon: Tonny Bollman, MD;  Location: [facility] OR;  Service: Open Heart Surgery;  Laterality: N/A;   Social History   Occupational History   Not on file  Tobacco Use   Smoking status: Never   Smokeless tobacco: Never  Vaping Use   Vaping status: Never Used  Substance and Sexual Activity   Alcohol use: No   Drug use: No   Sexual activity: Not on file

## 2023-08-08 ENCOUNTER — Encounter: Payer: Self-pay | Admitting: Physician Assistant

## 2023-08-08 ENCOUNTER — Other Ambulatory Visit (INDEPENDENT_AMBULATORY_CARE_PROVIDER_SITE_OTHER): Payer: Medicare Other

## 2023-08-08 ENCOUNTER — Ambulatory Visit: Payer: Medicare Other | Admitting: Physician Assistant

## 2023-08-08 DIAGNOSIS — S82001A Unspecified fracture of right patella, initial encounter for closed fracture: Secondary | ICD-10-CM | POA: Diagnosis not present

## 2023-08-08 NOTE — Progress Notes (Signed)
 HPI: Molly Cobb returns today for follow-up of her right patella fracture.  Right patella fracture on 07/01/2023.  She has been in a Bledsoe brace locked out in full extension.  She is ambulating with therapy.  She is in the skilled facility.  Having difficulty with the Bledsoe brace staying up for her leg.  Physical exam: Right knee no abnormal warmth erythema.  Calf supple nontender.  Radiographs: Lateral view right knee shows further consolidation.  No change in overall position alignment patella fracture.  No other fractures identified  Impression: Right patella fracture  Plan: Knee brace work note 30 degrees of flexion.  Will see her back in 2 weeks at that time obtain lateral view of the right knee.  Most likely open her knee to 60 degrees of flexion at that time.  Questions were encouraged and answered.

## 2023-08-22 ENCOUNTER — Ambulatory Visit: Payer: Medicare Other | Admitting: Physician Assistant

## 2023-08-22 ENCOUNTER — Other Ambulatory Visit: Payer: Self-pay

## 2023-08-22 DIAGNOSIS — S82001A Unspecified fracture of right patella, initial encounter for closed fracture: Secondary | ICD-10-CM

## 2023-08-22 NOTE — Progress Notes (Signed)
 HPI: Molly Cobb returns today follow-up of her right patella fracture.  Again she sustained right patella fracture on 07/01/23.  She remains in a Bledsoe brace locked out at 30 degrees of flexion.  She mains skilled facility.  She is working on transfers.  Requires significant assistance for daughter who presents with her today.   Physical exam: Right leg calf supple.  No abnormal warmth erythema or effusion no skin breakdown.  Radiographs: Lateral view right knee shows the patella fracture remain unchanged in position alignment.  There is been no significant consolidation since last films.  Impression: Right patella fracture 7 weeks conservative treatment  Plan: Will keep her at 0 to 30 degrees of flexion.  Follow-up in 1 month for lateral view of the knee.  Continue work with therapy for gait balance training.  Questions encouraged and answered at length.

## 2023-08-25 ENCOUNTER — Other Ambulatory Visit: Payer: Self-pay

## 2023-08-25 ENCOUNTER — Emergency Department (HOSPITAL_COMMUNITY)
Admission: EM | Admit: 2023-08-25 | Discharge: 2023-08-25 | Disposition: A | Discharge status: Skilled Nursing Facility | Payer: Medicare Other | Attending: Emergency Medicine | Admitting: Emergency Medicine

## 2023-08-25 ENCOUNTER — Emergency Department (HOSPITAL_COMMUNITY): Payer: Medicare Other

## 2023-08-25 DIAGNOSIS — R4182 Altered mental status, unspecified: Secondary | ICD-10-CM | POA: Insufficient documentation

## 2023-08-25 DIAGNOSIS — Z950 Presence of cardiac pacemaker: Secondary | ICD-10-CM | POA: Insufficient documentation

## 2023-08-25 DIAGNOSIS — E039 Hypothyroidism, unspecified: Secondary | ICD-10-CM | POA: Insufficient documentation

## 2023-08-25 DIAGNOSIS — E1122 Type 2 diabetes mellitus with diabetic chronic kidney disease: Secondary | ICD-10-CM | POA: Diagnosis not present

## 2023-08-25 DIAGNOSIS — Z79899 Other long term (current) drug therapy: Secondary | ICD-10-CM | POA: Insufficient documentation

## 2023-08-25 DIAGNOSIS — R011 Cardiac murmur, unspecified: Secondary | ICD-10-CM | POA: Insufficient documentation

## 2023-08-25 DIAGNOSIS — Z7982 Long term (current) use of aspirin: Secondary | ICD-10-CM | POA: Insufficient documentation

## 2023-08-25 DIAGNOSIS — I120 Hypertensive chronic kidney disease with stage 5 chronic kidney disease or end stage renal disease: Secondary | ICD-10-CM | POA: Diagnosis not present

## 2023-08-25 DIAGNOSIS — N185 Chronic kidney disease, stage 5: Secondary | ICD-10-CM | POA: Insufficient documentation

## 2023-08-25 DIAGNOSIS — R404 Transient alteration of awareness: Secondary | ICD-10-CM

## 2023-08-25 LAB — CBC WITH DIFFERENTIAL/PLATELET
Abs Immature Granulocytes: 0.03 10*3/uL (ref 0.00–0.07)
Basophils Absolute: 0 10*3/uL (ref 0.0–0.1)
Basophils Relative: 0 %
Eosinophils Absolute: 0.1 10*3/uL (ref 0.0–0.5)
Eosinophils Relative: 1 %
HCT: 31.3 % — ABNORMAL LOW (ref 36.0–46.0)
Hemoglobin: 9.6 g/dL — ABNORMAL LOW (ref 12.0–15.0)
Immature Granulocytes: 0 %
Lymphocytes Relative: 14 %
Lymphs Abs: 1.2 10*3/uL (ref 0.7–4.0)
MCH: 27.7 pg (ref 26.0–34.0)
MCHC: 30.7 g/dL (ref 30.0–36.0)
MCV: 90.5 fL (ref 80.0–100.0)
Monocytes Absolute: 0.9 10*3/uL (ref 0.1–1.0)
Monocytes Relative: 10 %
Neutro Abs: 6.3 10*3/uL (ref 1.7–7.7)
Neutrophils Relative %: 75 %
Platelets: 156 10*3/uL (ref 150–400)
RBC: 3.46 MIL/uL — ABNORMAL LOW (ref 3.87–5.11)
RDW: 15.6 % — ABNORMAL HIGH (ref 11.5–15.5)
WBC: 8.5 10*3/uL (ref 4.0–10.5)
nRBC: 0 % (ref 0.0–0.2)

## 2023-08-25 LAB — BASIC METABOLIC PANEL
Anion gap: 12 (ref 5–15)
BUN: 81 mg/dL — ABNORMAL HIGH (ref 8–23)
CO2: 24 mmol/L (ref 22–32)
Calcium: 8.6 mg/dL — ABNORMAL LOW (ref 8.9–10.3)
Chloride: 100 mmol/L (ref 98–111)
Creatinine, Ser: 3.67 mg/dL — ABNORMAL HIGH (ref 0.44–1.00)
GFR, Estimated: 11 mL/min — ABNORMAL LOW (ref >60)
Glucose, Bld: 210 mg/dL — ABNORMAL HIGH (ref 70–99)
Potassium: 4.2 mmol/L (ref 3.5–5.1)
Sodium: 136 mmol/L (ref 135–145)

## 2023-08-25 LAB — RESP PANEL BY RT-PCR (RSV, FLU A&B, COVID)  RVPGX2
Influenza A by PCR: NEGATIVE
Influenza B by PCR: NEGATIVE
Resp Syncytial Virus by PCR: NEGATIVE
SARS Coronavirus 2 by RT PCR: NEGATIVE

## 2023-08-25 LAB — TSH: TSH: 11.836 u[IU]/mL — ABNORMAL HIGH (ref 0.350–4.500)

## 2023-08-25 NOTE — ED Notes (Signed)
 [facility] notified for transport

## 2023-08-25 NOTE — ED Provider Notes (Signed)
 [place] EMERGENCY DEPARTMENT AT [facility] Provider Note   CSN: [id number] Arrival date & time: 08/25/23  1153     History  Chief Complaint  Patient presents with   Altered Mental Status    Molly Cobb is a 89 y.o. female.  Pt is a 89 year old female with chronic kidney disease stage V not on dialysis but with AV fistula, severe aortic stenosis status post TAVR, SSS s/p medtronic pacemaker, depression, diabetes mellitus, hypertension and hypothyroidism who was recently hospitalized in January after a fall with a fractured patella and a UTI who is currently at [facility] undergoing rehab and being brought in today due to a episode of being unresponsive.  The facility reports that they were trying to arouse her this morning and she was unable to be aroused with nailbed pressure and sternal rubs.  They called EMS however on the ride over here patient opened her eyes and ask why they were bringing her here.  They tried to tell her where she was and she reported that she knew where she was and she just did not have anything to say so she did not respond to them.  She only complains at this time of a runny nose but denies any pain, shortness of breath, abdominal pain, nausea or vomiting.  She feels that her right leg where the injury was feels at baseline and reports she has overall been gradually getting better.  She has not noticed any swelling in her legs and denies a headache at this time.  Based on facility notes patient was started on Macrobid yesterday assuming related to a UTI and has had had 3 doses total.  Based on information from a urine culture done in January she was sensitive to that medication in the past.  The history is provided by the patient and medical records.  Altered Mental Status      Home Medications Prior to Admission medications   Medication Sig Start Date End Date Taking? Authorizing Provider  aspirin 81 MG chewable tablet Chew 1 tablet (81 mg total)  by mouth daily. 02/25/17   Arty Baumgartner, NP  atorvastatin (LIPITOR) 40 MG tablet Take 1 tablet (40 mg total) by mouth every morning. 10/01/21   Medina-Vargas, Monina C, NP  bisacodyl (DULCOLAX) 10 MG suppository If not relieved by MOM, give 10 mg Bisacodyl suppositiory rectally X 1 dose in 24 hours as needed    [provider]  carvedilol (COREG) 12.5 MG tablet Take 12.5 mg by mouth 2 (two) times daily with a meal. 03/30/23 03/29/24  [provider]  Coenzyme Q10 (COQ-10 PO) Take 1 capsule by mouth every morning.    [provider]  conjugated estrogens (PREMARIN) vaginal cream Place 1 Applicatorful vaginally every Monday, Wednesday, and Friday. 10/02/21   Medina-Vargas, Monina C, NP  escitalopram (LEXAPRO) 10 MG tablet Take 1 tablet (10 mg total) by mouth every morning. 10/01/21   Medina-Vargas, Monina C, NP  ezetimibe (ZETIA) 10 MG tablet Take 1 tablet (10 mg total) by mouth every morning. 10/01/21   Medina-Vargas, Monina C, NP  isosorbide mononitrate (IMDUR) 30 MG 24 hr tablet Take 1 tablet (30 mg total) by mouth every morning. 10/01/21   Medina-Vargas, Monina C, NP  levothyroxine (SYNTHROID) 100 MCG tablet Take 1 tablet (100 mcg total) by mouth daily at 6 (six) AM. 07/06/23   Amin, Ankit C, MD  lidocaine (LIDODERM) 5 % Place 1 patch onto the skin daily. Remove & Discard patch within  12 hours or as directed by MD Patient taking differently: Place 1 patch onto the skin daily as needed. Remove & Discard patch within 12 hours or as directed by MD 05/31/22   Renne Crigler, PA-C  Magnesium Hydroxide (MILK OF MAGNESIA PO) Take by mouth. If no BM in 3 days, give 30 cc Milk of Magnesium p.o. x 1 dose in 24 hours as needed    [provider]  Melatonin 5 MG CHEW Chew 1 tablet by mouth at bedtime.    [provider]  Multiple Vitamin (MULTIVITAMIN WITH MINERALS) TABS tablet Take 1 tablet by mouth daily.    [provider]  nitroGLYCERIN (NITROSTAT) 0.4 MG SL  tablet Place 1 tablet (0.4 mg total) under the tongue every 5 (five) minutes as needed for chest pain. 10/01/21   Medina-Vargas, Margit Banda, NP  NON FORMULARY Diet:Heart Healthy/CCD/1200 fluid restriction    [provider]  pantoprazole (PROTONIX) 40 MG tablet Take 1 tablet (40 mg total) by mouth daily. 10/01/21   Medina-Vargas, Monina C, NP  sodium bicarbonate 650 MG tablet Take 1 tablet (650 mg total) by mouth every morning. Patient taking differently: Take 650-1,300 mg by mouth 2 (two) times daily. Take 1 tablet (650 mg) in the morning and Take 2 tablets (1300 mg) at bedtime 10/01/21   Medina-Vargas, Monina C, NP  sodium bicarbonate 650 MG tablet Take 2 tablets (1,300 mg total) by mouth 2 (two) times daily. 07/13/23   Calvert Cantor, MD  Sodium Phosphates (RA SALINE ENEMA RE) If not relieved by Biscodyl suppository, give disposable Saline Enema rectally X 1 dose/24 hrs as needed    [provider]  TRADJENTA 5 MG TABS tablet Take 1 tablet (5 mg total) by mouth every morning. 10/01/21   Medina-Vargas, Monina C, NP      Allergies    Oxybutynin, Oysters [shellfish allergy], Shellfish-derived products, Sulfa antibiotics, and Sulfacetamide sodium    Review of Systems   Review of Systems  Physical Exam Updated Vital Signs BP (!) 142/59   Pulse 70   Temp 98.2 F (36.8 C) (Oral)   Resp 13   SpO2 97%  Physical Exam Vitals and nursing note reviewed.  Constitutional:      General: She is not in acute distress.    Appearance: She is well-developed.  HENT:     Head: Normocephalic and atraumatic.  Eyes:     Pupils: Pupils are equal, round, and reactive to light.  Cardiovascular:     Rate and Rhythm: Normal rate and regular rhythm.     Heart sounds: Murmur heard.     Systolic murmur is present with a grade of 3/6.     No friction rub.  Pulmonary:     Effort: Pulmonary effort is normal.     Breath sounds: Normal breath sounds. No wheezing or rales.  Abdominal:     General: Bowel  sounds are normal. There is no distension.     Palpations: Abdomen is soft.     Tenderness: There is no abdominal tenderness. There is no guarding or rebound.  Musculoskeletal:        General: No tenderness. Normal range of motion.     Comments: No edema  Skin:    General: Skin is warm and dry.     Findings: No rash.  Neurological:     Mental Status: She is alert and oriented to person, place, and time.     Cranial Nerves: No cranial nerve deficit.  Sensory: No sensory deficit.     Motor: No weakness.     Comments: Patient is able to sit up in the bed on her own, follow commands  Psychiatric:        Behavior: Behavior normal.     ED Results / Procedures / Treatments   Labs (all labs ordered are listed, but only abnormal results are displayed) Labs Reviewed  CBC WITH DIFFERENTIAL/PLATELET - Abnormal; Notable for the following components:      Result Value   RBC 3.46 (*)    Hemoglobin 9.6 (*)    HCT 31.3 (*)    RDW 15.6 (*)    All other components within normal limits  BASIC METABOLIC PANEL - Abnormal; Notable for the following components:   Glucose, Bld 210 (*)    BUN 81 (*)    Creatinine, Ser 3.67 (*)    Calcium 8.6 (*)    GFR, Estimated 11 (*)    All other components within normal limits  TSH - Abnormal; Notable for the following components:   TSH 11.836 (*)    All other components within normal limits  RESP PANEL BY RT-PCR (RSV, FLU A&B, COVID)  [id number]    EKG EKG Interpretation Date/Time:  Thursday August 25 2023 12:07:56 EST Ventricular Rate:  70 PR Interval:  190 QRS Duration:  142 QT Interval:  466 QTC Calculation: 503 R Axis:   -23  Text Interpretation: Ventricular-paced rhythm Confirmed by Gwyneth Sprout ([id number]) on 08/25/2023 12:36:23 PM  Radiology DG Chest Port 1 View Result Date: 08/25/2023 CLINICAL DATA:  unresponsive/ now resolved. EXAM: PORTABLE CHEST 1 VIEW COMPARISON:  07/12/2023. FINDINGS: Bilateral lung fields are clear. Bilateral  costophrenic angles are clear. Stable cardio-mediastinal silhouette. Prosthetic aortic valve noted. There is a left sided 3-lead pacemaker. No acute osseous abnormalities. Probable old healed fracture deformity of proximal left humerus noted. The soft tissues are within normal limits. IMPRESSION: No active disease. Electronically Signed   By: Jules Schick M.D.   On: 08/25/2023 15:06    Procedures Procedures    Medications Ordered in ED Medications - No data to display  ED Course/ Medical Decision Making/ A&P                                 Medical Decision Making Amount and/or Complexity of Data Reviewed Independent Historian: EMS External Data Reviewed: notes. Labs: ordered. Decision-making details documented in ED Course. Radiology: ordered and independent interpretation performed. Decision-making details documented in ED Course. ECG/medicine tests: ordered and independent interpretation performed. Decision-making details documented in ED Course.   Pt with multiple medical problems and comorbidities and presenting today with a complaint that caries a high risk for morbidity and mortality.  Patient here today after an episode of not responding to the staff at [facility] this morning.  She reports that she did not have anything to say so she just did not wake up to talk to them.  She appears normal here and is able to answer questions reasonably moves all extremities and has no focal deficits.  Patient does appear to recently have started on Macrobid presumably for a UTI but denies any symptoms other than nasal congestion today.  She has no history of seizures and during last hospitalization when she was having symptoms of encephalopathy she had an EEG that showed no seizure activity.  There was no noted evidence of shaking or focality today.  Low suspicion for  stroke.  Patient does have a pacemaker and will interrogated to ensure no evidence of dysrhythmia during this event today.  Will  ensure no acute changes in her labs but vital signs are reassuring.  3:11 PM I independently interpreted patient's labs and EKG.  EKG showed a paced rhythm, CBC without acute findings with stable hemoglobin of 9.6 and normal white count, BMP with renal function of 3.68 which is similar to her baseline, TSH continues to improve and now is 11 from 20 during her last hospitalization.  COVID and flu are negative.  I have independently visualized and interpreted pt's images today.  Chest x-ray within normal limits.  Pacemaker was interrogated and there have been no episodes since November when she had a 5 beat run of the ventricular rhythm.  Patient's family is at bedside and discussed this with them.  Patient is still awake and at her baseline.  Family reports they have not noticed anything unusual about her since they have been here.  At this time feel that patient is stable to be discharged back to her facility.         Final Clinical Impression(s) / ED Diagnoses Final diagnoses:  Unresponsive episode    Rx / DC Orders ED Discharge Orders     None         Gwyneth Sprout, MD 08/25/23 1513

## 2023-08-25 NOTE — Discharge Instructions (Signed)
 All blood work looks good today.  Pacemaker working effectively.  Continue your current medications and recent addition of your antibiotics.

## 2023-08-25 NOTE — ED Notes (Signed)
Pacemaker interrogation done. 

## 2023-08-25 NOTE — ED Triage Notes (Signed)
 BIBA from [facility] for AMS. Pt reportedly has confusion at baseline- this AM was completely unable to be aroused by staff. Pt is now A&O x 3, GCS 15 now.

## 2023-09-19 ENCOUNTER — Ambulatory Visit: Payer: Medicare Other | Admitting: Orthopaedic Surgery

## 2023-09-19 ENCOUNTER — Other Ambulatory Visit (INDEPENDENT_AMBULATORY_CARE_PROVIDER_SITE_OTHER)

## 2023-09-19 DIAGNOSIS — S82001D Unspecified fracture of right patella, subsequent encounter for closed fracture with routine healing: Secondary | ICD-10-CM

## 2023-09-19 DIAGNOSIS — S82001A Unspecified fracture of right patella, initial encounter for closed fracture: Secondary | ICD-10-CM

## 2023-09-19 NOTE — Progress Notes (Signed)
 The patient is a 89 year old female who is now almost 12 weeks status post a mechanical fall in which she sustained a nondisplaced right patella fracture.  She has been in a Bledsoe knee brace but it keeps sliding down to her ankle and has caused irritation of her ankle soft tissue.  She denies any knee pain.  She does ambulate with full weightbearing with therapy but has not been flexing her knee appropriately.  On exam I can palpate around the right patella and this causes her no pain and the patella is together.  I took the knee brace off and she can fully hold her knee extended with no extensor lag.  I did have her flex her knee to about 45 degrees and then she could extended on her own.  A single lateral view of the patella of the right knee shows that the fracture line is visible but there is been significant interval healing compared to previous films.  At this point we will need to have her out of the Bledsoe knee brace since that is causing too much soft tissue irritation.  She can weight-bear as tolerated but I do not want therapy working on flexing her knee and she needs to try to avoid flexing beyond 90 degrees.  With that being said we will see her back for 1 more visit in 3 weeks with a single lateral view of her right knee.  The family is with her and agreed with this treatment plan.

## 2023-10-10 ENCOUNTER — Encounter: Payer: Self-pay | Admitting: Orthopaedic Surgery

## 2023-10-10 ENCOUNTER — Other Ambulatory Visit (INDEPENDENT_AMBULATORY_CARE_PROVIDER_SITE_OTHER): Payer: Self-pay

## 2023-10-10 ENCOUNTER — Ambulatory Visit: Admitting: Orthopaedic Surgery

## 2023-10-10 DIAGNOSIS — S82001D Unspecified fracture of right patella, subsequent encounter for closed fracture with routine healing: Secondary | ICD-10-CM

## 2023-10-10 NOTE — Progress Notes (Signed)
 The patient is a 89 year old female who is now just past 3 months status post a right patella fracture that was nondisplaced.  She is frustrated and a Bledsoe knee brace.  She now is out of the brace and reports only minimal pain with her knee.  On exam she can perform a straight leg raise and extend her knee on her own easily from 90 degrees of flexion.  When I press on the patella I do not feel any gaps in the patella and she says her discomfort is minimal.  A single lateral view of her right patella shows that there is no displacement the fracture but the fracture line is still visible.  Clinically she is doing well.  I will continue allow her to be up with a walker with weightbearing as tolerated and no brace.  From my standpoint we will see her back in 3 months with a repeat lateral view of her right knee.  If there is issues before then they know to let us  know but I would not recommend a surgery based on her clinical exam and how she is doing overall.

## 2023-11-15 ENCOUNTER — Encounter (HOSPITAL_COMMUNITY): Payer: Self-pay | Admitting: Emergency Medicine

## 2023-11-15 ENCOUNTER — Emergency Department (HOSPITAL_COMMUNITY)
Admission: EM | Admit: 2023-11-15 | Discharge: 2023-11-15 | Disposition: A | Discharge status: Skilled Nursing Facility | Attending: Emergency Medicine | Admitting: Emergency Medicine

## 2023-11-15 ENCOUNTER — Emergency Department (HOSPITAL_COMMUNITY)

## 2023-11-15 ENCOUNTER — Other Ambulatory Visit: Payer: Self-pay

## 2023-11-15 DIAGNOSIS — W1839XA Other fall on same level, initial encounter: Secondary | ICD-10-CM | POA: Diagnosis not present

## 2023-11-15 DIAGNOSIS — S8001XA Contusion of right knee, initial encounter: Secondary | ICD-10-CM | POA: Diagnosis not present

## 2023-11-15 DIAGNOSIS — Z7982 Long term (current) use of aspirin: Secondary | ICD-10-CM | POA: Diagnosis not present

## 2023-11-15 DIAGNOSIS — S8991XA Unspecified injury of right lower leg, initial encounter: Secondary | ICD-10-CM | POA: Diagnosis present

## 2023-11-15 DIAGNOSIS — S5001XA Contusion of right elbow, initial encounter: Secondary | ICD-10-CM | POA: Insufficient documentation

## 2023-11-15 NOTE — ED Triage Notes (Signed)
 Patient presents from [facility] due to a mechanical fall with a new walker. The walker made noise with "threw her off," and she fell on her buttock. She did not hit her head, but does complain of right elbow and right knee pain. Pain lessened when EMS picked her up off of the floor. She did not loose consciousness and is not on blood thinner.    EMS vitals: 110/70 BP 90 P 140 CBG

## 2023-11-15 NOTE — Discharge Instructions (Signed)
 Your x-rays today did not show any new fractures.  Use Tylenol  as needed for pain.  Follow-up with your doctor as needed

## 2023-11-15 NOTE — ED Provider Notes (Signed)
 [place] EMERGENCY DEPARTMENT AT [facility] Provider Note   CSN: [id number] Arrival date & time: 11/15/23  1559     History  Chief Complaint  Patient presents with   Molly Cobb    Molly Cobb is a 89 y.o. female.  This is a 89 year old female who presents after mechanical fall prior to arrival.  Was using her walker and lost her balance.  Did not strike her head did not lose consciousness.  Complains of pain to her right elbow and her right knee.  Denies any hip pain at this time.  No distal numbness or tingling to her right foot or right hand.  Has full range of motion at her right knee and right elbow.  Denies any right hand or wrist pain       Home Medications Prior to Admission medications   Medication Sig Start Date End Date Taking? Authorizing Provider  aspirin  81 MG chewable tablet Chew 1 tablet (81 mg total) by mouth daily. 02/25/17   Sanjuanita Cruz, NP  atorvastatin  (LIPITOR) 40 MG tablet Take 1 tablet (40 mg total) by mouth every morning. 10/01/21   Medina-Vargas, Monina C, NP  bisacodyl  (DULCOLAX) 10 MG suppository If not relieved by MOM, give 10 mg Bisacodyl  suppositiory rectally X 1 dose in 24 hours as needed    [provider]  carvedilol  (COREG ) 12.5 MG tablet Take 12.5 mg by mouth 2 (two) times daily with a meal. 03/30/23 03/29/24  [provider]  Coenzyme Q10 (COQ-10 PO) Take 1 capsule by mouth every morning.    [provider]  conjugated estrogens  (PREMARIN ) vaginal cream Place 1 Applicatorful vaginally every Monday, Wednesday, and Friday. 10/02/21   Medina-Vargas, Monina C, NP  escitalopram  (LEXAPRO ) 10 MG tablet Take 1 tablet (10 mg total) by mouth every morning. 10/01/21   Medina-Vargas, Monina C, NP  ezetimibe  (ZETIA ) 10 MG tablet Take 1 tablet (10 mg total) by mouth every morning. 10/01/21   Medina-Vargas, Monina C, NP  isosorbide  mononitrate (IMDUR ) 30 MG 24 hr tablet Take 1 tablet (30 mg total) by mouth every morning. 10/01/21    Medina-Vargas, Monina C, NP  levothyroxine  (SYNTHROID ) 100 MCG tablet Take 1 tablet (100 mcg total) by mouth daily at 6 (six) AM. 07/06/23   Amin, Ankit C, MD  lidocaine  (LIDODERM ) 5 % Place 1 patch onto the skin daily. Remove & Discard patch within 12 hours or as directed by MD Patient taking differently: Place 1 patch onto the skin daily as needed. Remove & Discard patch within 12 hours or as directed by MD 05/31/22   Lyna Sandhoff, PA-C  Magnesium  Hydroxide (MILK OF MAGNESIA PO) Take by mouth. If no BM in 3 days, give 30 cc Milk of Magnesium  p.o. x 1 dose in 24 hours as needed    [provider]  Melatonin 5 MG CHEW Chew 1 tablet by mouth at bedtime.    [provider]  Multiple Vitamin (MULTIVITAMIN WITH MINERALS) TABS tablet Take 1 tablet by mouth daily.    [provider]  nitroGLYCERIN  (NITROSTAT ) 0.4 MG SL tablet Place 1 tablet (0.4 mg total) under the tongue every 5 (five) minutes as needed for chest pain. 10/01/21   Medina-Vargas, Monina C, NP  NON FORMULARY Diet:Heart Healthy/CCD/1200 fluid restriction    [provider]  pantoprazole  (PROTONIX ) 40 MG tablet Take 1 tablet (40 mg total) by mouth daily. 10/01/21   Medina-Vargas, Monina C, NP  sodium bicarbonate  650 MG tablet Take 1 tablet (  650 mg total) by mouth every morning. Patient taking differently: Take 650-1,300 mg by mouth 2 (two) times daily. Take 1 tablet (650 mg) in the morning and Take 2 tablets (1300 mg) at bedtime 10/01/21   Medina-Vargas, Monina C, NP  sodium bicarbonate  650 MG tablet Take 2 tablets (1,300 mg total) by mouth 2 (two) times daily. 07/13/23   Rizwan, Saima, MD  Sodium Phosphates (RA SALINE ENEMA RE) If not relieved by Biscodyl suppository, give disposable Saline Enema rectally X 1 dose/24 hrs as needed    [provider]  TRADJENTA  5 MG TABS tablet Take 1 tablet (5 mg total) by mouth every morning. 10/01/21   Medina-Vargas, Monina C, NP      Allergies    Oxybutynin, Oysters  [shellfish allergy], Shellfish-derived products, Sulfa antibiotics, and Sulfacetamide sodium    Review of Systems   Review of Systems  All other systems reviewed and are negative.   Physical Exam Updated Vital Signs BP (!) 180/56 (BP Location: Right Arm)   Pulse 72   Temp 98 F (36.7 C) (Oral)   Resp 20   SpO2 100%  Physical Exam Vitals and nursing note reviewed.  Constitutional:      General: She is not in acute distress.    Appearance: Normal appearance. She is well-developed. She is not toxic-appearing.  HENT:     Head: Normocephalic and atraumatic.  Eyes:     General: Lids are normal.     Conjunctiva/sclera: Conjunctivae normal.     Pupils: Pupils are equal, round, and reactive to light.  Neck:     Thyroid : No thyroid  mass.     Trachea: No tracheal deviation.  Cardiovascular:     Rate and Rhythm: Normal rate and regular rhythm.     Heart sounds: Normal heart sounds. No murmur heard.    No gallop.  Pulmonary:     Effort: Pulmonary effort is normal. No respiratory distress.     Breath sounds: Normal breath sounds. No stridor. No decreased breath sounds, wheezing, rhonchi or rales.  Abdominal:     General: There is no distension.     Palpations: Abdomen is soft.     Tenderness: There is no abdominal tenderness. There is no rebound.  Musculoskeletal:        General: Normal range of motion.     Right elbow: No deformity or effusion. Normal range of motion.       Arms:     Cervical back: Normal range of motion and neck supple.     Right knee: No deformity or effusion. Normal range of motion. Tenderness present.  Skin:    General: Skin is warm and dry.     Findings: No abrasion or rash.  Neurological:     Mental Status: She is alert and oriented to person, place, and time. Mental status is at baseline.     GCS: GCS eye subscore is 4. GCS verbal subscore is 5. GCS motor subscore is 6.     Cranial Nerves: No cranial nerve deficit.     Sensory: No sensory deficit.      Motor: Motor function is intact.  Psychiatric:        Attention and Perception: Attention normal.        Speech: Speech normal.        Behavior: Behavior normal.     ED Results / Procedures / Treatments   Labs (all labs ordered are listed, but only abnormal results are displayed) Labs Reviewed - No  data to display  EKG None  Radiology No results found.  Procedures Procedures    Medications Ordered in ED Medications - No data to display  ED Course/ Medical Decision Making/ A&P                                 Medical Decision Making Amount and/or Complexity of Data Reviewed Radiology: ordered.   X-rays of patient's right elbow and right knee per my dictation showed no acute fracture.  Will discharge home        Final Clinical Impression(s) / ED Diagnoses Final diagnoses:  None    Rx / DC Orders ED Discharge Orders     None         Lind Repine, MD 11/15/23 1746

## 2023-12-13 ENCOUNTER — Emergency Department (HOSPITAL_COMMUNITY)

## 2023-12-13 ENCOUNTER — Other Ambulatory Visit: Payer: Self-pay

## 2023-12-13 ENCOUNTER — Inpatient Hospital Stay (HOSPITAL_COMMUNITY)
Admission: EM | Admit: 2023-12-13 | Discharge: 2023-12-20 | DRG: 641 | Disposition: A | Discharge status: Skilled Nursing Facility | Source: Skilled Nursing Facility | Attending: Family Medicine | Admitting: Family Medicine

## 2023-12-13 DIAGNOSIS — Z8249 Family history of ischemic heart disease and other diseases of the circulatory system: Secondary | ICD-10-CM

## 2023-12-13 DIAGNOSIS — Z823 Family history of stroke: Secondary | ICD-10-CM

## 2023-12-13 DIAGNOSIS — I2510 Atherosclerotic heart disease of native coronary artery without angina pectoris: Secondary | ICD-10-CM | POA: Diagnosis present

## 2023-12-13 DIAGNOSIS — E8721 Acute metabolic acidosis: Secondary | ICD-10-CM | POA: Diagnosis present

## 2023-12-13 DIAGNOSIS — D631 Anemia in chronic kidney disease: Secondary | ICD-10-CM | POA: Diagnosis present

## 2023-12-13 DIAGNOSIS — Z953 Presence of xenogenic heart valve: Secondary | ICD-10-CM

## 2023-12-13 DIAGNOSIS — N179 Acute kidney failure, unspecified: Secondary | ICD-10-CM | POA: Diagnosis present

## 2023-12-13 DIAGNOSIS — E66811 Obesity, class 1: Secondary | ICD-10-CM | POA: Diagnosis present

## 2023-12-13 DIAGNOSIS — Z23 Encounter for immunization: Secondary | ICD-10-CM

## 2023-12-13 DIAGNOSIS — Z66 Do not resuscitate: Secondary | ICD-10-CM | POA: Diagnosis not present

## 2023-12-13 DIAGNOSIS — E875 Hyperkalemia: Principal | ICD-10-CM | POA: Diagnosis present

## 2023-12-13 DIAGNOSIS — E860 Dehydration: Secondary | ICD-10-CM | POA: Diagnosis present

## 2023-12-13 DIAGNOSIS — M25561 Pain in right knee: Secondary | ICD-10-CM | POA: Diagnosis present

## 2023-12-13 DIAGNOSIS — Z79899 Other long term (current) drug therapy: Secondary | ICD-10-CM

## 2023-12-13 DIAGNOSIS — F01511 Vascular dementia, unspecified severity, with agitation: Secondary | ICD-10-CM | POA: Diagnosis present

## 2023-12-13 DIAGNOSIS — Z6833 Body mass index (BMI) 33.0-33.9, adult: Secondary | ICD-10-CM

## 2023-12-13 DIAGNOSIS — W19XXXA Unspecified fall, initial encounter: Secondary | ICD-10-CM | POA: Diagnosis present

## 2023-12-13 DIAGNOSIS — Z515 Encounter for palliative care: Secondary | ICD-10-CM

## 2023-12-13 DIAGNOSIS — Z950 Presence of cardiac pacemaker: Secondary | ICD-10-CM

## 2023-12-13 DIAGNOSIS — N185 Chronic kidney disease, stage 5: Secondary | ICD-10-CM | POA: Diagnosis present

## 2023-12-13 DIAGNOSIS — F32A Depression, unspecified: Secondary | ICD-10-CM | POA: Diagnosis present

## 2023-12-13 DIAGNOSIS — E785 Hyperlipidemia, unspecified: Secondary | ICD-10-CM | POA: Diagnosis present

## 2023-12-13 DIAGNOSIS — Z7982 Long term (current) use of aspirin: Secondary | ICD-10-CM

## 2023-12-13 DIAGNOSIS — R296 Repeated falls: Secondary | ICD-10-CM | POA: Diagnosis present

## 2023-12-13 DIAGNOSIS — M25511 Pain in right shoulder: Secondary | ICD-10-CM | POA: Diagnosis present

## 2023-12-13 DIAGNOSIS — Z7984 Long term (current) use of oral hypoglycemic drugs: Secondary | ICD-10-CM

## 2023-12-13 DIAGNOSIS — Z79890 Hormone replacement therapy (postmenopausal): Secondary | ICD-10-CM

## 2023-12-13 DIAGNOSIS — E039 Hypothyroidism, unspecified: Secondary | ICD-10-CM | POA: Diagnosis present

## 2023-12-13 DIAGNOSIS — E1122 Type 2 diabetes mellitus with diabetic chronic kidney disease: Secondary | ICD-10-CM | POA: Diagnosis present

## 2023-12-13 DIAGNOSIS — I120 Hypertensive chronic kidney disease with stage 5 chronic kidney disease or end stage renal disease: Secondary | ICD-10-CM | POA: Diagnosis present

## 2023-12-13 DIAGNOSIS — M545 Low back pain, unspecified: Secondary | ICD-10-CM | POA: Diagnosis present

## 2023-12-13 LAB — COMPREHENSIVE METABOLIC PANEL WITH GFR
ALT: 16 U/L (ref 0–44)
AST: 28 U/L (ref 15–41)
Albumin: 3.2 g/dL — ABNORMAL LOW (ref 3.5–5.0)
Alkaline Phosphatase: 111 U/L (ref 38–126)
Anion gap: 10 (ref 5–15)
BUN: 69 mg/dL — ABNORMAL HIGH (ref 8–23)
CO2: 19 mmol/L — ABNORMAL LOW (ref 22–32)
Calcium: 8.7 mg/dL — ABNORMAL LOW (ref 8.9–10.3)
Chloride: 108 mmol/L (ref 98–111)
Creatinine, Ser: 4.27 mg/dL — ABNORMAL HIGH (ref 0.44–1.00)
GFR, Estimated: 9 mL/min — ABNORMAL LOW (ref >60)
Glucose, Bld: 199 mg/dL — ABNORMAL HIGH (ref 70–99)
Potassium: 6.8 mmol/L (ref 3.5–5.1)
Sodium: 137 mmol/L (ref 135–145)
Total Bilirubin: 0.7 mg/dL (ref 0.0–1.2)
Total Protein: 6.6 g/dL (ref 6.5–8.1)

## 2023-12-13 LAB — CBC
HCT: 29.4 % — ABNORMAL LOW (ref 36.0–46.0)
Hemoglobin: 8.8 g/dL — ABNORMAL LOW (ref 12.0–15.0)
MCH: 28.2 pg (ref 26.0–34.0)
MCHC: 29.9 g/dL — ABNORMAL LOW (ref 30.0–36.0)
MCV: 94.2 fL (ref 80.0–100.0)
Platelets: 152 10*3/uL (ref 150–400)
RBC: 3.12 MIL/uL — ABNORMAL LOW (ref 3.87–5.11)
RDW: 16.4 % — ABNORMAL HIGH (ref 11.5–15.5)
WBC: 8.6 10*3/uL (ref 4.0–10.5)
nRBC: 0 % (ref 0.0–0.2)

## 2023-12-13 LAB — URINALYSIS, ROUTINE W REFLEX MICROSCOPIC
Bacteria, UA: NONE SEEN
Bilirubin Urine: NEGATIVE
Glucose, UA: 50 mg/dL — AB
Hgb urine dipstick: NEGATIVE
Ketones, ur: NEGATIVE mg/dL
Leukocytes,Ua: NEGATIVE
Nitrite: NEGATIVE
Protein, ur: 100 mg/dL — AB
Specific Gravity, Urine: 1.012 (ref 1.005–1.030)
pH: 8 (ref 5.0–8.0)

## 2023-12-13 LAB — CBG MONITORING, ED: Glucose-Capillary: 167 mg/dL — ABNORMAL HIGH (ref 70–99)

## 2023-12-13 MED ORDER — INSULIN ASPART 100 UNIT/ML IV SOLN
5.0000 [IU] | Freq: Once | INTRAVENOUS | Status: AC
  Administered 2023-12-13: 5 [IU] via INTRAVENOUS
  Filled 2023-12-13: qty 0.05

## 2023-12-13 MED ORDER — LIDOCAINE 5 % EX PTCH
1.0000 | MEDICATED_PATCH | CUTANEOUS | Status: DC
  Administered 2023-12-13 – 2023-12-19 (×7): 1 via TRANSDERMAL
  Filled 2023-12-13 (×6): qty 1

## 2023-12-13 MED ORDER — OXYCODONE-ACETAMINOPHEN 5-325 MG PO TABS
1.0000 | ORAL_TABLET | Freq: Once | ORAL | Status: AC
  Administered 2023-12-13: 1 via ORAL
  Filled 2023-12-13: qty 1

## 2023-12-13 MED ORDER — ALBUTEROL SULFATE (2.5 MG/3ML) 0.083% IN NEBU
2.5000 mg | INHALATION_SOLUTION | Freq: Once | RESPIRATORY_TRACT | Status: AC
  Administered 2023-12-13: 2.5 mg via RESPIRATORY_TRACT
  Filled 2023-12-13: qty 3

## 2023-12-13 MED ORDER — SODIUM ZIRCONIUM CYCLOSILICATE 10 G PO PACK
10.0000 g | PACK | Freq: Once | ORAL | Status: AC
  Administered 2023-12-13: 10 g via ORAL
  Filled 2023-12-13: qty 1

## 2023-12-13 MED ORDER — SODIUM CHLORIDE 0.9 % IV BOLUS
250.0000 mL | Freq: Once | INTRAVENOUS | Status: AC
  Administered 2023-12-13: 250 mL via INTRAVENOUS

## 2023-12-13 MED ORDER — DEXTROSE 50 % IV SOLN
1.0000 | Freq: Once | INTRAVENOUS | Status: AC
  Administered 2023-12-13: 50 mL via INTRAVENOUS
  Filled 2023-12-13: qty 50
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
## 2023-12-13 NOTE — ED Provider Notes (Signed)
 [place] EMERGENCY DEPARTMENT AT [facility] Provider Note   CSN: [id number] Arrival date & time: 12/13/23  1933     Patient presents with: Molly Cobb   Molly Cobb is a 89 y.o. female.    Fall  Patient presents for fall.  Medical history includes HTN, CKD, DM, anemia, TAVR, pacemaker placement, gout, depression.  Per EMS, patient had a witnessed fall today.  She reportedly thought that she was being chased and ran down the hallway.  Patient, herself, states that she has had multiple falls over the past several days.  She describes these as mechanical due to loss of balance.  She denies striking her head during these falls.  She thinks the most recent fall was yesterday.  She endorses pain in her right shoulder, L5 area of back, and right knee.  She states that she is typically able to walk without any assistive devices.     Prior to Admission medications   Medication Sig Start Date End Date Taking? Authorizing Provider  aspirin  81 MG chewable tablet Chew 1 tablet (81 mg total) by mouth daily. 02/25/17   Sanjuanita Cruz, NP  atorvastatin  (LIPITOR) 40 MG tablet Take 1 tablet (40 mg total) by mouth every morning. 10/01/21   Medina-Vargas, Monina C, NP  bisacodyl  (DULCOLAX) 10 MG suppository If not relieved by MOM, give 10 mg Bisacodyl  suppositiory rectally X 1 dose in 24 hours as needed    [provider]  carvedilol  (COREG ) 12.5 MG tablet Take 12.5 mg by mouth 2 (two) times daily with a meal. 03/30/23 03/29/24  [provider]  Coenzyme Q10 (COQ-10 PO) Take 1 capsule by mouth every morning.    [provider]  conjugated estrogens  (PREMARIN ) vaginal cream Place 1 Applicatorful vaginally every Monday, Wednesday, and Friday. 10/02/21   Medina-Vargas, Monina C, NP  escitalopram  (LEXAPRO ) 10 MG tablet Take 1 tablet (10 mg total) by mouth every morning. 10/01/21   Medina-Vargas, Monina C, NP  ezetimibe  (ZETIA ) 10 MG tablet Take 1 tablet (10 mg total) by mouth every  morning. 10/01/21   Medina-Vargas, Monina C, NP  isosorbide  mononitrate (IMDUR ) 30 MG 24 hr tablet Take 1 tablet (30 mg total) by mouth every morning. 10/01/21   Medina-Vargas, Monina C, NP  levothyroxine  (SYNTHROID ) 100 MCG tablet Take 1 tablet (100 mcg total) by mouth daily at 6 (six) AM. 07/06/23   Amin, Ankit C, MD  lidocaine  (LIDODERM ) 5 % Place 1 patch onto the skin daily. Remove & Discard patch within 12 hours or as directed by MD Patient taking differently: Place 1 patch onto the skin daily as needed. Remove & Discard patch within 12 hours or as directed by MD 05/31/22   Lyna Sandhoff, PA-C  Magnesium  Hydroxide (MILK OF MAGNESIA PO) Take by mouth. If no BM in 3 days, give 30 cc Milk of Magnesium  p.o. x 1 dose in 24 hours as needed    [provider]  Melatonin 5 MG CHEW Chew 1 tablet by mouth at bedtime.    [provider]  Multiple Vitamin (MULTIVITAMIN WITH MINERALS) TABS tablet Take 1 tablet by mouth daily.    [provider]  nitroGLYCERIN  (NITROSTAT ) 0.4 MG SL tablet Place 1 tablet (0.4 mg total) under the tongue every 5 (five) minutes as needed for chest pain. 10/01/21   Medina-Vargas, Monina C, NP  NON FORMULARY Diet:Heart Healthy/CCD/1200 fluid restriction    [provider]  pantoprazole  (PROTONIX ) 40 MG tablet Take 1 tablet (40 mg total)  by mouth daily. 10/01/21   Medina-Vargas, Monina C, NP  sodium bicarbonate  650 MG tablet Take 1 tablet (650 mg total) by mouth every morning. Patient taking differently: Take 650-1,300 mg by mouth 2 (two) times daily. Take 1 tablet (650 mg) in the morning and Take 2 tablets (1300 mg) at bedtime 10/01/21   Medina-Vargas, Monina C, NP  sodium bicarbonate  650 MG tablet Take 2 tablets (1,300 mg total) by mouth 2 (two) times daily. 07/13/23   Rizwan, Saima, MD  Sodium Phosphates (RA SALINE ENEMA RE) If not relieved by Biscodyl suppository, give disposable Saline Enema rectally X 1 dose/24 hrs as needed    [provider]   TRADJENTA  5 MG TABS tablet Take 1 tablet (5 mg total) by mouth every morning. 10/01/21   Medina-Vargas, Monina C, NP    Allergies: Oxybutynin, Oysters [shellfish allergy], Shellfish-derived products, Sulfa antibiotics, and Sulfacetamide sodium    Review of Systems  Musculoskeletal:  Positive for arthralgias and back pain.  All other systems reviewed and are negative.   Updated Vital Signs BP (!) 174/69 (BP Location: Right Arm)   Pulse 82   Temp 98.2 F (36.8 C) (Oral)   Resp 20   SpO2 100%   Physical Exam Vitals and nursing note reviewed.  Constitutional:      General: She is not in acute distress.    Appearance: Normal appearance. She is well-developed. She is not ill-appearing, toxic-appearing or diaphoretic.  HENT:     Head: Normocephalic and atraumatic.     Right Ear: External ear normal.     Left Ear: External ear normal.     Nose: Nose normal.     Mouth/Throat:     Mouth: Mucous membranes are moist.   Eyes:     Extraocular Movements: Extraocular movements intact.     Conjunctiva/sclera: Conjunctivae normal.    Cardiovascular:     Rate and Rhythm: Normal rate and regular rhythm.  Pulmonary:     Effort: Pulmonary effort is normal. No respiratory distress.  Chest:     Chest wall: No tenderness.  Abdominal:     Palpations: Abdomen is soft.     Tenderness: There is no abdominal tenderness.   Musculoskeletal:        General: No swelling or deformity. Normal range of motion.     Cervical back: Normal range of motion and neck supple.   Skin:    General: Skin is warm and dry.     Coloration: Skin is not jaundiced or pale.   Neurological:     General: No focal deficit present.     Mental Status: She is alert and oriented to person, place, and time.   Psychiatric:        Mood and Affect: Mood normal.        Behavior: Behavior normal.     (all labs ordered are listed, but only abnormal results are displayed) Labs Reviewed  COMPREHENSIVE METABOLIC PANEL WITH  GFR - Abnormal; Notable for the following components:      Result Value   Potassium 6.8 (*)    CO2 19 (*)    Glucose, Bld 199 (*)    BUN 69 (*)    Creatinine, Ser 4.27 (*)    Calcium  8.7 (*)    Albumin  3.2 (*)    GFR, Estimated 9 (*)    All other components within normal limits  CBC - Abnormal; Notable for the following components:   RBC 3.12 (*)    Hemoglobin 8.8 (*)  HCT 29.4 (*)    MCHC 29.9 (*)    RDW 16.4 (*)    All other components within normal limits  URINALYSIS, ROUTINE W REFLEX MICROSCOPIC - Abnormal; Notable for the following components:   Glucose, UA 50 (*)    Protein, ur 100 (*)    All other components within normal limits  CBG MONITORING, ED - Abnormal; Notable for the following components:   Glucose-Capillary 167 (*)    All other components within normal limits    EKG: EKG Interpretation Date/Time:  Tuesday December 13 2023 20:53:48 EDT Ventricular Rate:  79 PR Interval:  216 QRS Duration:  156 QT Interval:  444 QTC Calculation: 509 R Axis:   -7  Text Interpretation: Sinus rhythm Atrial premature complex Borderline prolonged PR interval Left bundle branch block Confirmed by Iva Mariner ([id number]) on 12/13/2023 9:53:33 PM  Radiology: CT Lumbar Spine Wo Contrast Result Date: 12/13/2023 CLINICAL DATA:  Back trauma, no prior imaging (Age >= 16y) EXAM: CT LUMBAR SPINE WITHOUT CONTRAST TECHNIQUE: Multidetector CT imaging of the lumbar spine was performed without intravenous contrast administration. Multiplanar CT image reconstructions were also generated. RADIATION DOSE REDUCTION: This exam was performed according to the departmental dose-optimization program which includes automated exposure control, adjustment of the mA and/or kV according to patient size and/or use of iterative reconstruction technique. COMPARISON:  None Available. FINDINGS: Segmentation: 5 lumbar type vertebrae. Alignment: Normal. Vertebrae: Multilevel moderate to severe degenerative changes of the  spine. No associated severe osseous neural foraminal or central canal stenosis. No acute fracture or focal pathologic process. Paraspinal and other soft tissues: Negative. Disc levels: Multilevel intervertebral disc space vacuum phenomenon. Other: Severe atherosclerotic plaque. Colonic diverticulosis. Likely hysterectomy. IMPRESSION: No acute displaced fracture or traumatic listhesis of the lumbar spine. Electronically Signed   By: Morgane  Naveau M.D.   On: 12/13/2023 22:02   CT HEAD WO CONTRAST Result Date: 12/13/2023 CLINICAL DATA:  Head trauma, moderate-severe.  Fall EXAM: CT HEAD WITHOUT CONTRAST TECHNIQUE: Contiguous axial images were obtained from the base of the skull through the vertex without intravenous contrast. RADIATION DOSE REDUCTION: This exam was performed according to the departmental dose-optimization program which includes automated exposure control, adjustment of the mA and/or kV according to patient size and/or use of iterative reconstruction technique. COMPARISON:  CT head 07/09/2023 FINDINGS: Brain: Cerebral ventricle sizes are concordant with the degree of cerebral volume loss. No evidence of large-territorial acute infarction. No parenchymal hemorrhage. No mass lesion. No extra-axial collection. No mass effect or midline shift. No hydrocephalus. Basilar cisterns are patent. Vascular: No hyperdense vessel. Atherosclerotic calcifications are present within the cavernous internal carotid arteries. Skull: No acute fracture or focal lesion. Sinuses/Orbits: Paranasal sinuses and mastoid air cells are clear. The orbits are unremarkable. Other: None. IMPRESSION: No acute intracranial abnormality. Electronically Signed   By: Morgane  Naveau M.D.   On: 12/13/2023 22:00   CT CERVICAL SPINE WO CONTRAST Result Date: 12/13/2023 EXAM: CT CERVICAL SPINE WITHOUT CONTRAST 12/13/2023 09:56:37 PM TECHNIQUE: CT of the cervical was performed without the administration of intravenous contrast. Multiplanar  reformatted images are provided for review. Automated exposure control, iterative reconstruction, and/or weight based adjustment of the mA/kV was utilized to reduce the radiation dose to as low as reasonably achievable. COMPARISON: None available. CLINICAL HISTORY: Polytrauma, blunt. Notes from triage: Pt BIB EMS from [facility] for a witnessed fall. Pt c/o left shoulder, neck and back pain. Pt thought she was being chased and started to run down the hall per EMS.  Pt ended up falling on her left side. No LOC or head injury; Hx dementia and schizophrenia. FINDINGS: CERVICAL SPINE: BONES AND ALIGNMENT: No acute fracture or traumatic malalignment. DEGENERATIVE CHANGES: Mild degenerative changes at C5-6 and C6-7. SOFT TISSUES: No prevertebral soft tissue swelling. IMPRESSION: 1. No traumatic injury to the cervical spine. 2. Mild degenerative changes. Electronically signed by: Zadie Herter MD 12/13/2023 10:00 PM EDT RP Workstation: [id number]   DG Pelvis Portable Result Date: 12/13/2023 CLINICAL DATA:  Trauma, pain EXAM: PORTABLE PELVIS 1-2 VIEWS COMPARISON:  07/01/2023 FINDINGS: Supine frontal view the pelvis was performed, limited by technique and body habitus. The bones are osteopenic. Prior ORIF right hip unchanged. No acute displaced fracture. Sacroiliac joints are unremarkable. IMPRESSION: 1. Stable exam, no acute fracture. Electronically Signed   By: Bobbye Burrow M.D.   On: 12/13/2023 20:34   DG Shoulder Right Port Result Date: 12/13/2023 CLINICAL DATA:  Blunt trauma, fell EXAM: RIGHT SHOULDER - 1 VIEW COMPARISON:  None Available. FINDINGS: Internal rotation, external rotation, transscapular views of the right shoulder are obtained. Prior healed right humeral diaphyseal fracture. No acute fracture, subluxation, or dislocation. Moderate acromioclavicular joint osteoarthritis. Soft tissues are unremarkable. Right chest is clear. IMPRESSION: 1. No acute displaced fracture. 2. Prior healed right  humeral fracture. 3. Osteoarthritis. Electronically Signed   By: Bobbye Burrow M.D.   On: 12/13/2023 20:30   DG Knee Right Port Result Date: 12/13/2023 CLINICAL DATA:  Marvell Slider, trauma EXAM: PORTABLE RIGHT KNEE - 1-2 VIEW COMPARISON:  11/15/2023 FINDINGS: Frontal and cross-table lateral views of the right knee are obtained. The mildly distracted transverse patellar fracture seen on prior study is again noted unchanged. No acute fracture, subluxation, or dislocation. Stable mild 3 compartmental osteoarthritis. Soft tissues are unremarkable. IMPRESSION: 1. No acute displaced fracture. 2. Stable minimally distracted mid patellar fracture. Electronically Signed   By: Bobbye Burrow M.D.   On: 12/13/2023 20:29   DG Chest Port 1 View Result Date: 12/13/2023 CLINICAL DATA:  Trauma, fell EXAM: PORTABLE CHEST 1 VIEW COMPARISON:  08/25/2023 FINDINGS: Single frontal view of the chest demonstrates stable multi lead pacemaker. Aortic valve prosthesis unchanged. Stable enlargement of the cardiac silhouette. No acute airspace disease, effusion, or pneumothorax. No acute displaced fracture. IMPRESSION: 1. No acute intrathoracic process. Electronically Signed   By: Bobbye Burrow M.D.   On: 12/13/2023 20:27     Procedures   Medications Ordered in the ED  lidocaine  (LIDODERM ) 5 % 1 patch (1 patch Transdermal Patch Applied 12/13/23 2103)  oxyCODONE -acetaminophen  (PERCOCET/ROXICET) 5-325 MG per tablet 1 tablet (1 tablet Oral Given 12/13/23 2103)  sodium zirconium cyclosilicate  (LOKELMA ) packet 10 g (10 g Oral Given 12/13/23 2247)  albuterol  (PROVENTIL ) (2.5 MG/3ML) 0.083% nebulizer solution 2.5 mg (2.5 mg Nebulization Given 12/13/23 2248)  insulin  aspart (novoLOG ) injection 5 Units (5 Units Intravenous Given 12/13/23 2258)    And  dextrose  50 % solution 50 mL (50 mLs Intravenous Given 12/13/23 2257)  sodium chloride  0.9 % bolus 250 mL (250 mLs Intravenous Bolus 12/13/23 2247)                                    Medical  Decision Making Amount and/or Complexity of Data Reviewed Labs: ordered. Radiology: ordered.  Risk OTC drugs. Prescription drug management.   This patient presents to the ED for concern of fall, this involves an extensive number of treatment options, and is a complaint that carries  with it a high risk of complications and morbidity.  The differential diagnosis includes acute injuries   Co morbidities / Chronic conditions that complicate the patient evaluation  HTN, CKD, DM, anemia, TAVR, pacemaker placement, gout, depression   Additional history obtained:  Additional history obtained from EMR External records from outside source obtained and reviewed including patient's family   Lab Tests:  I Ordered, and personally interpreted labs.  The pertinent results include: Creatinine slightly increased from baseline, hyperkalemia is present.  Hemoglobin is baseline.  No leukocytosis is present.  No evidence of UTI.   Imaging Studies ordered:  I ordered imaging studies including x-ray of chest, pelvis, right shoulder, right knee; CT scan of head, C-spine, L-spine I independently visualized and interpreted imaging which showed no acute findings. I agree with the radiologist interpretation   Cardiac Monitoring: / EKG:  The patient was maintained on a cardiac monitor.  I personally viewed and interpreted the cardiac monitored which showed an underlying rhythm of: Sinus rhythm   Problem List / ED Course / Critical interventions / Medication management  Patient presents after what is described as a mechanical fall.  She resides in a nursing facility and reportedly was on the impression that she was being chased.  This caused her to run on the hall.  It was at this time that she fell.  On arrival, patient denies any falls but states that she has had some recent falls.  She endorses pain in her right shoulder, right knee, and L5 area of lower back.  Anterior tenderness is present to right  shoulder.  Range of motion is preserved.  No deformities are noted.  Right knee does not show any evidence of swelling, erythema, or significant tenderness.  She is able to range this joint as well.  She has some mild tenderness to L5 area on her back.  Percocet and lidocaine  patch were ordered for analgesia.  Workup was initiated.  After her arrival, family joined her.  They did confirm that there was a fall at facility today.  Imaging studies did not show acute findings.  Lab work was notable for hyperkalemia with a potassium of 6.8.  Per family, she had a fistula placed 2 years ago in anticipation of needing dialysis.  She never did go on dialysis.  Temporizing medications were ordered for her hyperkalemia.  I spoke with nephrologist on-call, Dr. Britta Candy, who recommends admission here at [facility].  Patient to be admitted for further management. I ordered medication including Percocet and lidocaine  patch for analgesia; insulin /dextrose , albuterol , Lokelma , IV fluids for hyperkalemia Reevaluation of the patient after these medicines showed that the patient improved I have reviewed the patients home medicines and have made adjustments as needed   Consultations Obtained:  I requested consultation with the nephrologist, Dr. Cindra Cree,  and discussed lab and imaging findings as well as pertinent plan - they recommend: Medical management here at [facility].   Social Determinants of Health:  Lives in assisted living facility.  CRITICAL CARE Performed by: Iva Mariner   Total critical care time: 32 minutes  Critical care time was exclusive of separately billable procedures and treating other patients.  Critical care was necessary to treat or prevent imminent or life-threatening deterioration.  Critical care was time spent personally by me on the following activities: development of treatment plan with patient and/or surrogate as well as nursing, discussions with consultants, evaluation of  patient's response to treatment, examination of patient, obtaining history from patient or surrogate,  ordering and performing treatments and interventions, ordering and review of laboratory studies, ordering and review of radiographic studies, pulse oximetry and re-evaluation of patient's condition.      Final diagnoses:  Hyperkalemia  Fall, initial encounter    ED Discharge Orders     None          Iva Mariner, MD 12/13/23 2305

## 2023-12-14 ENCOUNTER — Other Ambulatory Visit: Payer: Self-pay

## 2023-12-14 ENCOUNTER — Encounter (HOSPITAL_COMMUNITY): Payer: Self-pay | Admitting: Internal Medicine

## 2023-12-14 DIAGNOSIS — Z66 Do not resuscitate: Secondary | ICD-10-CM | POA: Diagnosis not present

## 2023-12-14 DIAGNOSIS — E875 Hyperkalemia: Secondary | ICD-10-CM

## 2023-12-14 DIAGNOSIS — Z7982 Long term (current) use of aspirin: Secondary | ICD-10-CM | POA: Diagnosis not present

## 2023-12-14 DIAGNOSIS — F32A Depression, unspecified: Secondary | ICD-10-CM | POA: Diagnosis present

## 2023-12-14 DIAGNOSIS — N185 Chronic kidney disease, stage 5: Secondary | ICD-10-CM | POA: Diagnosis present

## 2023-12-14 DIAGNOSIS — E1122 Type 2 diabetes mellitus with diabetic chronic kidney disease: Secondary | ICD-10-CM | POA: Diagnosis present

## 2023-12-14 DIAGNOSIS — W19XXXA Unspecified fall, initial encounter: Secondary | ICD-10-CM | POA: Diagnosis present

## 2023-12-14 DIAGNOSIS — I120 Hypertensive chronic kidney disease with stage 5 chronic kidney disease or end stage renal disease: Secondary | ICD-10-CM | POA: Diagnosis present

## 2023-12-14 DIAGNOSIS — Z515 Encounter for palliative care: Secondary | ICD-10-CM | POA: Diagnosis not present

## 2023-12-14 DIAGNOSIS — E039 Hypothyroidism, unspecified: Secondary | ICD-10-CM | POA: Diagnosis present

## 2023-12-14 DIAGNOSIS — I2510 Atherosclerotic heart disease of native coronary artery without angina pectoris: Secondary | ICD-10-CM | POA: Diagnosis present

## 2023-12-14 DIAGNOSIS — Z953 Presence of xenogenic heart valve: Secondary | ICD-10-CM | POA: Diagnosis not present

## 2023-12-14 DIAGNOSIS — E66811 Obesity, class 1: Secondary | ICD-10-CM | POA: Diagnosis present

## 2023-12-14 DIAGNOSIS — N179 Acute kidney failure, unspecified: Secondary | ICD-10-CM | POA: Diagnosis present

## 2023-12-14 DIAGNOSIS — F01511 Vascular dementia, unspecified severity, with agitation: Secondary | ICD-10-CM | POA: Diagnosis present

## 2023-12-14 DIAGNOSIS — E8721 Acute metabolic acidosis: Secondary | ICD-10-CM | POA: Diagnosis present

## 2023-12-14 DIAGNOSIS — D631 Anemia in chronic kidney disease: Secondary | ICD-10-CM | POA: Diagnosis present

## 2023-12-14 DIAGNOSIS — E860 Dehydration: Secondary | ICD-10-CM | POA: Diagnosis present

## 2023-12-14 DIAGNOSIS — R296 Repeated falls: Secondary | ICD-10-CM | POA: Diagnosis present

## 2023-12-14 DIAGNOSIS — E785 Hyperlipidemia, unspecified: Secondary | ICD-10-CM | POA: Diagnosis present

## 2023-12-14 DIAGNOSIS — Z79890 Hormone replacement therapy (postmenopausal): Secondary | ICD-10-CM | POA: Diagnosis not present

## 2023-12-14 DIAGNOSIS — Z8249 Family history of ischemic heart disease and other diseases of the circulatory system: Secondary | ICD-10-CM | POA: Diagnosis not present

## 2023-12-14 DIAGNOSIS — Z7984 Long term (current) use of oral hypoglycemic drugs: Secondary | ICD-10-CM | POA: Diagnosis not present

## 2023-12-14 DIAGNOSIS — Z7189 Other specified counseling: Secondary | ICD-10-CM | POA: Diagnosis not present

## 2023-12-14 DIAGNOSIS — Z950 Presence of cardiac pacemaker: Secondary | ICD-10-CM | POA: Diagnosis not present

## 2023-12-14 DIAGNOSIS — Z23 Encounter for immunization: Secondary | ICD-10-CM | POA: Diagnosis present

## 2023-12-14 LAB — BASIC METABOLIC PANEL WITH GFR
Anion gap: 9 (ref 5–15)
BUN: 69 mg/dL — ABNORMAL HIGH (ref 8–23)
CO2: 20 mmol/L — ABNORMAL LOW (ref 22–32)
Calcium: 8.9 mg/dL (ref 8.9–10.3)
Chloride: 109 mmol/L (ref 98–111)
Creatinine, Ser: 4.07 mg/dL — ABNORMAL HIGH (ref 0.44–1.00)
GFR, Estimated: 10 mL/min — ABNORMAL LOW (ref >60)
Glucose, Bld: 76 mg/dL (ref 70–99)
Potassium: 5.8 mmol/L — ABNORMAL HIGH (ref 3.5–5.1)
Sodium: 138 mmol/L (ref 135–145)

## 2023-12-14 LAB — GLUCOSE, CAPILLARY
Glucose-Capillary: 67 mg/dL — ABNORMAL LOW (ref 70–99)
Glucose-Capillary: 91 mg/dL (ref 70–99)
Glucose-Capillary: 91 mg/dL (ref 70–99)
Glucose-Capillary: 121 mg/dL — ABNORMAL HIGH (ref 70–99)
Glucose-Capillary: 181 mg/dL — ABNORMAL HIGH (ref 70–99)

## 2023-12-14 LAB — POTASSIUM: Potassium: 5.3 mmol/L — ABNORMAL HIGH (ref 3.5–5.1)

## 2023-12-14 LAB — CBC
HCT: 30.6 % — ABNORMAL LOW (ref 36.0–46.0)
Hemoglobin: 9.2 g/dL — ABNORMAL LOW (ref 12.0–15.0)
MCH: 28.5 pg (ref 26.0–34.0)
MCHC: 30.1 g/dL (ref 30.0–36.0)
MCV: 94.7 fL (ref 80.0–100.0)
Platelets: 161 10*3/uL (ref 150–400)
RBC: 3.23 MIL/uL — ABNORMAL LOW (ref 3.87–5.11)
RDW: 16.3 % — ABNORMAL HIGH (ref 11.5–15.5)
WBC: 9.5 10*3/uL (ref 4.0–10.5)
nRBC: 0.2 % (ref 0.0–0.2)

## 2023-12-14 LAB — HEMOGLOBIN A1C
Hgb A1c MFr Bld: 7.1 % — ABNORMAL HIGH (ref 4.8–5.6)
Mean Plasma Glucose: 157.07 mg/dL

## 2023-12-14 LAB — MRSA NEXT GEN BY PCR, NASAL: MRSA by PCR Next Gen: NOT DETECTED

## 2023-12-14 MED ORDER — EZETIMIBE 10 MG PO TABS
10.0000 mg | ORAL_TABLET | Freq: Every morning | ORAL | Status: DC
  Administered 2023-12-14 – 2023-12-20 (×7): 10 mg via ORAL
  Filled 2023-12-14 (×8): qty 1

## 2023-12-14 MED ORDER — ENOXAPARIN SODIUM 30 MG/0.3ML IJ SOSY
30.0000 mg | PREFILLED_SYRINGE | INTRAMUSCULAR | Status: DC
  Administered 2023-12-14 – 2023-12-20 (×7): 30 mg via SUBCUTANEOUS
  Filled 2023-12-14 (×7): qty 0.3

## 2023-12-14 MED ORDER — ONDANSETRON HCL 4 MG/2ML IJ SOLN: 4.0000 mg | Freq: Four times a day (QID) | INTRAMUSCULAR | Status: DC | PRN

## 2023-12-14 MED ORDER — ISOSORBIDE MONONITRATE ER 30 MG PO TB24
30.0000 mg | ORAL_TABLET | Freq: Every morning | ORAL | Status: DC
  Administered 2023-12-14 – 2023-12-20 (×7): 30 mg via ORAL
  Filled 2023-12-14 (×9): qty 1

## 2023-12-14 MED ORDER — PANTOPRAZOLE SODIUM 40 MG PO TBEC
40.0000 mg | DELAYED_RELEASE_TABLET | Freq: Every day | ORAL | Status: DC
  Administered 2023-12-14 – 2023-12-20 (×7): 40 mg via ORAL
  Filled 2023-12-14 (×8): qty 1

## 2023-12-14 MED ORDER — INSULIN ASPART 100 UNIT/ML IJ SOLN
0.0000 [IU] | Freq: Three times a day (TID) | INTRAMUSCULAR | Status: DC
  Administered 2023-12-14: 1 [IU] via SUBCUTANEOUS
  Administered 2023-12-15 – 2023-12-18 (×4): 2 [IU] via SUBCUTANEOUS
  Administered 2023-12-18: 1 [IU] via SUBCUTANEOUS
  Administered 2023-12-19: 2 [IU] via SUBCUTANEOUS

## 2023-12-14 MED ORDER — ACETAMINOPHEN 325 MG PO TABS
650.0000 mg | ORAL_TABLET | Freq: Four times a day (QID) | ORAL | Status: DC | PRN
  Administered 2023-12-14 – 2023-12-19 (×2): 650 mg via ORAL
  Filled 2023-12-14 (×2): qty 2

## 2023-12-14 MED ORDER — FUROSEMIDE 10 MG/ML IJ SOLN
80.0000 mg | Freq: Once | INTRAMUSCULAR | Status: AC
  Administered 2023-12-14: 80 mg via INTRAVENOUS
  Filled 2023-12-14: qty 8

## 2023-12-14 MED ORDER — LEVOTHYROXINE SODIUM 100 MCG PO TABS
100.0000 ug | ORAL_TABLET | Freq: Every day | ORAL | Status: DC
  Administered 2023-12-14 – 2023-12-20 (×7): 100 ug via ORAL
  Filled 2023-12-14 (×7): qty 1

## 2023-12-14 MED ORDER — INSULIN ASPART 100 UNIT/ML IJ SOLN
0.0000 [IU] | Freq: Every day | INTRAMUSCULAR | Status: DC
  Administered 2023-12-17: 2 [IU] via SUBCUTANEOUS

## 2023-12-14 MED ORDER — ATORVASTATIN CALCIUM 40 MG PO TABS
40.0000 mg | ORAL_TABLET | Freq: Every morning | ORAL | Status: DC
  Administered 2023-12-14 – 2023-12-20 (×7): 40 mg via ORAL
  Filled 2023-12-14 (×8): qty 1

## 2023-12-14 MED ORDER — SODIUM ZIRCONIUM CYCLOSILICATE 10 G PO PACK
10.0000 g | PACK | Freq: Three times a day (TID) | ORAL | Status: DC
  Administered 2023-12-14 – 2023-12-15 (×5): 10 g via ORAL
  Filled 2023-12-14 (×5): qty 1

## 2023-12-14 MED ORDER — DIVALPROEX SODIUM 125 MG PO DR TAB
125.0000 mg | DELAYED_RELEASE_TABLET | Freq: Every day | ORAL | Status: DC
  Administered 2023-12-14 – 2023-12-20 (×7): 125 mg via ORAL
  Filled 2023-12-14 (×8): qty 1

## 2023-12-14 MED ORDER — FUROSEMIDE 10 MG/ML IJ SOLN: 20.0000 mg | Freq: Once | INTRAMUSCULAR | Status: DC

## 2023-12-14 MED ORDER — ONDANSETRON HCL 4 MG PO TABS
4.0000 mg | ORAL_TABLET | Freq: Four times a day (QID) | ORAL | Status: DC | PRN
Start: 2023-12-14 — End: 2023-12-20

## 2023-12-14 MED ORDER — PNEUMOCOCCAL 20-VAL CONJ VACC 0.5 ML IM SUSY
0.5000 mL | PREFILLED_SYRINGE | INTRAMUSCULAR | Status: AC
  Administered 2023-12-18: 0.5 mL via INTRAMUSCULAR
  Filled 2023-12-14 (×2): qty 0.5

## 2023-12-14 MED ORDER — CALCIUM GLUCONATE-NACL 1-0.675 GM/50ML-% IV SOLN
1.0000 g | Freq: Once | INTRAVENOUS | Status: AC
  Administered 2023-12-14: 1000 mg via INTRAVENOUS
  Filled 2023-12-14: qty 50

## 2023-12-14 MED ORDER — ESCITALOPRAM OXALATE 10 MG PO TABS
10.0000 mg | ORAL_TABLET | Freq: Every morning | ORAL | Status: DC
  Administered 2023-12-14 – 2023-12-20 (×7): 10 mg via ORAL
  Filled 2023-12-14 (×8): qty 1

## 2023-12-14 MED ORDER — ASPIRIN 81 MG PO CHEW
81.0000 mg | CHEWABLE_TABLET | Freq: Every day | ORAL | Status: DC
  Administered 2023-12-14 – 2023-12-20 (×7): 81 mg via ORAL
  Filled 2023-12-14 (×8): qty 1

## 2023-12-14 MED ORDER — ESCITALOPRAM OXALATE 10 MG PO TABS: 5.0000 mg | ORAL_TABLET | Freq: Every day | ORAL | Status: DC

## 2023-12-14 MED ORDER — ACETAMINOPHEN 650 MG RE SUPP: 650.0000 mg | Freq: Four times a day (QID) | RECTAL | Status: DC | PRN

## 2023-12-14 MED ORDER — CARVEDILOL 12.5 MG PO TABS
12.5000 mg | ORAL_TABLET | Freq: Two times a day (BID) | ORAL | Status: DC
  Administered 2023-12-14 – 2023-12-19 (×9): 12.5 mg via ORAL
  Filled 2023-12-14 (×10): qty 1

## 2023-12-14 MED ORDER — SODIUM BICARBONATE 650 MG PO TABS
650.0000 mg | ORAL_TABLET | Freq: Three times a day (TID) | ORAL | Status: DC
  Administered 2023-12-14 – 2023-12-20 (×18): 650 mg via ORAL
  Filled 2023-12-14 (×21): qty 1

## 2023-12-14 MED ORDER — SODIUM CHLORIDE 0.9 % IV SOLN: INTRAVENOUS | Status: AC

## 2023-12-14 MED ORDER — TRAZODONE HCL 50 MG PO TABS
25.0000 mg | ORAL_TABLET | Freq: Every day | ORAL | Status: DC
  Administered 2023-12-14 – 2023-12-19 (×6): 25 mg via ORAL
  Filled 2023-12-14 (×6): qty 1

## 2023-12-14 NOTE — ED Notes (Signed)
 Patient Had got out of bed and pulled her IV out. Patient was cleaned up and put back on the bed.
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
## 2023-12-14 NOTE — Progress Notes (Signed)
 Taking over care of patient at 1500.  Agree with previous RN assessment.

## 2023-12-14 NOTE — Plan of Care (Signed)

## 2023-12-14 NOTE — H&P (Signed)
 History and Physical    Molly Cobb ZOX:[id number] DOB: Mar 10, 1932 DOA: 12/13/2023  PCP: Sindy Dues, MD   Chief Complaint:  fall  HPI: Molly Cobb is a 89 y.o. female with medical history significant of CKD, CAD, type 2 diabetes, hypertension, aortic stenosis status post TAVR who presented to the emergency department after a fall.  Patient was having multiple falls over the last several days due to losing her balance.  She presented to the ER where she was found to be hypertensive with systolics in the 170s.  Labs were obtained which showed potassium 6.8, creatinine 4.2, WBC 8.6, hemoglobin 8.8, urinalysis negative for infection.  Patient underwent CT spine which showed no evidence of fracture.  CT head showed no acute findings.  X-ray of shoulder showed no acute fractures.   Review of Systems: Review of Systems  Constitutional:  Negative for chills, fever, malaise/fatigue and weight loss.  HENT: Negative.    Eyes: Negative.   Respiratory: Negative.    Cardiovascular: Negative.   Gastrointestinal: Negative.   Genitourinary: Negative.   Musculoskeletal: Negative.   Skin: Negative.   Neurological: Negative.   Endo/Heme/Allergies: Negative.   Psychiatric/Behavioral: Negative.    All other systems reviewed and are negative.    As per HPI otherwise 10 point review of systems negative.   Allergies  Allergen Reactions   Oxybutynin Other (See Comments)    Caused hallucinations   Oysters [Shellfish Allergy] Nausea And Vomiting   Shellfish-Derived Products     Other Reaction(s): GI Intolerance   Sulfa Antibiotics Nausea And Vomiting   Sulfacetamide Sodium Rash    Past Medical History:  Diagnosis Date   CKD (chronic kidney disease)    a. followed by Dr. Alvie Jolly at [facility]    Coronary artery disease    02/23/17 PCI/DES to mRCA,  normal EF   Depression    Diabetes mellitus (HCC)    Gout    Hypertension    Hypothyroidism    Presence of permanent cardiac pacemaker     a. followed by [facility]   S/P TAVR (transcatheter aortic valve replacement) 04/12/2017   23 mm Edwards Sapien 3 transcatheter heart valve placed via percutaneous right transfemoral approach    Severe aortic stenosis    a. diagnosed 01/2017 during admission for chest pain.     Past Surgical History:  Procedure Laterality Date   APPENDECTOMY     CESAREAN SECTION     CORONARY ATHERECTOMY N/A 02/23/2017   Procedure: CORONARY ATHERECTOMY;  Surgeon: Arnoldo Lapping, MD;  Location: [facility] INVASIVE CV LAB;  Service: Cardiovascular;  Laterality: N/A;   ESOPHAGOGASTRODUODENOSCOPY N/A 02/18/2017   Procedure: ESOPHAGOGASTRODUODENOSCOPY (EGD);  Surgeon: Janel Medford, MD;  Location: [facility] ENDOSCOPY;  Service: Endoscopy;  Laterality: N/A;   EYE SURGERY     FEMUR FRACTURE SURGERY     HOT HEMOSTASIS N/A 02/18/2017   Procedure: HOT HEMOSTASIS (ARGON PLASMA COAGULATION/BICAP);  Surgeon: Janel Medford, MD;  Location: [facility] ENDOSCOPY;  Service: Endoscopy;  Laterality: N/A;   IR RADIOLOGY PERIPHERAL GUIDED IV START  03/07/2017   IR US  GUIDE VASC ACCESS RIGHT  03/07/2017   RIGHT/LEFT HEART CATH AND CORONARY ANGIOGRAPHY N/A 02/16/2017   Procedure: RIGHT/LEFT HEART CATH AND CORONARY ANGIOGRAPHY;  Surgeon: Wenona Hamilton, MD;  Location: [facility] INVASIVE CV LAB;  Service: Cardiovascular;  Laterality: N/A;   ROTATOR CUFF REPAIR     TEE WITHOUT CARDIOVERSION N/A 04/12/2017   Procedure: TRANSESOPHAGEAL ECHOCARDIOGRAM (TEE);  Surgeon: Arnoldo Lapping, MD;  Location: [facility] OR;  Service: Open Heart Surgery;  Laterality: N/A;   THYROID  SURGERY     TRANSCATHETER AORTIC VALVE REPLACEMENT, TRANSFEMORAL N/A 04/12/2017   Procedure: TRANSCATHETER AORTIC VALVE REPLACEMENT, TRANSFEMORAL;  Surgeon: Arnoldo Lapping, MD;  Location: [facility] OR;  Service: Open Heart Surgery;  Laterality: N/A;     reports that she has never smoked. She has never used smokeless tobacco. She reports that she does not drink alcohol and does not use drugs.  Family  History  Problem Relation Age of Onset   CAD Mother        MI in her mid 54's   Stroke Mother        Light stroke   Diabetes Father    Hypertension Sister    Healthy Sister    Healthy Sister    Lung cancer Sister    Heart disease Neg Hx     Prior to Admission medications   Medication Sig Start Date End Date Taking? Authorizing Provider  acetaminophen  (TYLENOL ) 500 MG tablet Take 500 mg by mouth every 6 (six) hours as needed (Pain). 05/11/22  Yes [provider]  aspirin  81 MG chewable tablet Chew 1 tablet (81 mg total) by mouth daily. 02/25/17  Yes Sanjuanita Cruz, NP  atorvastatin  (LIPITOR) 40 MG tablet Take 1 tablet (40 mg total) by mouth every morning. 10/01/21  Yes Medina-Vargas, Monina C, NP  carvedilol  (COREG ) 12.5 MG tablet Take 12.5 mg by mouth 2 (two) times daily with a meal. 03/30/23 03/29/24 Yes [provider]  Coenzyme Q10 (COQ-10 PO) Take 1 capsule by mouth every morning.   Yes [provider]  divalproex (DEPAKOTE) 125 MG DR tablet Take 125 mg by mouth daily at 6 (six) AM. 11/30/23  Yes [provider]  escitalopram  (LEXAPRO ) 10 MG tablet Take 1 tablet (10 mg total) by mouth every morning. 10/01/21  Yes Medina-Vargas, Monina C, NP  escitalopram  (LEXAPRO ) 5 MG tablet Take 5 mg by mouth daily. 11/30/23  Yes [provider]  ezetimibe  (ZETIA ) 10 MG tablet Take 1 tablet (10 mg total) by mouth every morning. 10/01/21  Yes Medina-Vargas, Monina C, NP  fexofenadine (ALLEGRA) 180 MG tablet Take 180 mg by mouth daily.   Yes [provider]  glipiZIDE  (GLUCOTROL ) 5 MG tablet Take 5 mg by mouth daily. 11/30/23  Yes [provider]  Glucerna (GLUCERNA) LIQD Take 118.5 mLs by mouth at bedtime.   Yes [provider]  isosorbide  mononitrate (IMDUR ) 30 MG 24 hr tablet Take 1 tablet (30 mg total) by mouth every morning. 10/01/21  Yes Medina-Vargas, Monina C, NP  levothyroxine  (SYNTHROID ) 100 MCG tablet Take 1 tablet (100 mcg total)  by mouth daily at 6 (six) AM. 07/06/23  Yes Amin, Ankit C, MD  lidocaine  (LIDODERM ) 5 % Place 1 patch onto the skin daily. Remove & Discard patch within 12 hours or as directed by MD Patient taking differently: Place 1 patch onto the skin daily as needed. Remove & Discard patch within 12 hours or as directed by MD 05/31/22  Yes Lyna Sandhoff, PA-C  magnesium  oxide (MAG-OX) 400 MG tablet Take 400 mg by mouth daily. 01/13/22  Yes [provider]  pantoprazole  (PROTONIX ) 40 MG tablet Take 1 tablet (40 mg total) by mouth daily. 10/01/21  Yes Medina-Vargas, Monina C, NP  senna-docusate (SENOKOT-S) 8.6-50 MG tablet Take 2 tablets by mouth 2 (two) times daily as needed for mild constipation or moderate constipation. 12/12/21  Yes [provider]  sodium bicarbonate  650 MG tablet  Take 1 tablet (650 mg total) by mouth every morning. Patient taking differently: Take 650 mg by mouth 3 (three) times daily. 10/01/21  Yes Medina-Vargas, Monina C, NP  TRADJENTA  5 MG TABS tablet Take 1 tablet (5 mg total) by mouth every morning. 10/01/21  Yes Medina-Vargas, Monina C, NP  traZODone  (DESYREL ) 50 MG tablet Take 25 mg by mouth at bedtime. 04/05/22 09/28/24 Yes [provider]  bisacodyl  (DULCOLAX) 10 MG suppository If not relieved by MOM, give 10 mg Bisacodyl  suppositiory rectally X 1 dose in 24 hours as needed Patient not taking: Reported on 12/14/2023    [provider]  conjugated estrogens  (PREMARIN ) vaginal cream Place 1 Applicatorful vaginally every Monday, Wednesday, and Friday. Patient not taking: Reported on 12/14/2023 10/02/21   Medina-Vargas, Monina C, NP  divalproex (DEPAKOTE ER) 250 MG 24 hr tablet Take 250 mg by mouth daily. Patient not taking: Reported on 12/14/2023 12/12/23   [provider]  Magnesium  Hydroxide (MILK OF MAGNESIA PO) Take by mouth. If no BM in 3 days, give 30 cc Milk of Magnesium  p.o. x 1 dose in 24 hours as needed Patient not taking: Reported on 12/14/2023     [provider]  Melatonin 5 MG CHEW Chew 1 tablet by mouth at bedtime. Patient not taking: Reported on 12/14/2023    [provider]  Multiple Vitamin (MULTIVITAMIN WITH MINERALS) TABS tablet Take 1 tablet by mouth daily. Patient not taking: Reported on 12/14/2023    [provider]  nitroGLYCERIN  (NITROSTAT ) 0.4 MG SL tablet Place 1 tablet (0.4 mg total) under the tongue every 5 (five) minutes as needed for chest pain. Patient not taking: Reported on 12/14/2023 10/01/21   Medina-Vargas, Monina C, NP  Sodium Phosphates (RA SALINE ENEMA RE) If not relieved by Biscodyl suppository, give disposable Saline Enema rectally X 1 dose/24 hrs as needed Patient not taking: Reported on 12/14/2023    [provider]    Physical Exam: Vitals:   12/13/23 1940 12/13/23 2355  BP: (!) 174/69 (!) 159/58  Pulse: 82 72  Resp: 20 20  Temp: 98.2 F (36.8 C) 97.8 F (36.6 C)  TempSrc: Oral Oral  SpO2: 100% 100%   Physical Exam Constitutional:      Appearance: She is normal weight.  HENT:     Head: Normocephalic.     Nose: Nose normal.     Mouth/Throat:     Mouth: Mucous membranes are moist.     Pharynx: Oropharynx is clear.   Eyes:     Conjunctiva/sclera: Conjunctivae normal.     Pupils: Pupils are equal, round, and reactive to light.    Cardiovascular:     Rate and Rhythm: Normal rate and regular rhythm.     Pulses: Normal pulses.  Pulmonary:     Effort: Pulmonary effort is normal.  Abdominal:     General: Abdomen is flat.   Musculoskeletal:        General: Normal range of motion.     Cervical back: Normal range of motion.   Skin:    General: Skin is warm.     Capillary Refill: Capillary refill takes less than 2 seconds.   Neurological:     General: No focal deficit present.     Mental Status: She is alert.   Psychiatric:        Mood and Affect: Mood normal.       Labs on Admission: I have personally reviewed the patients's labs and imaging  studies.  Assessment/Plan  Principal Problem:   Hyperkalemia   # Mechanical fall - CT and x-ray showed no evidence of fracture - Patient states that she needs a walker to get around - Patient typically able to ambulate independently  Plan: Obtain PT evaluation  # Hyperkalemia - Patient has history of kidney disease - Presenting with elevated potassium without EKG changes - Given insulin , calcium  gluconate and started on Lokelma  - Also given Lasix   Plan: Recheck labs in morning Continue Lokelma  and Lasix  If patient does not respond to these measures may need goals of care discussion for dialysis  #CKD5- continue to monitor  #Chornic anemia- likely renal related  Admission status: Inpatient Telemetry  Certification: The appropriate patient status for this patient is INPATIENT. Inpatient status is judged to be reasonable and necessary in order to provide the required intensity of service to ensure the patient's safety. The patient's presenting symptoms, physical exam findings, and initial radiographic and laboratory data in the context of their chronic comorbidities is felt to place them at high risk for further clinical deterioration. Furthermore, it is not anticipated that the patient will be medically stable for discharge from the hospital within 2 midnights of admission.   * I certify that at the point of admission it is my clinical judgment that the patient will require inpatient hospital care spanning beyond 2 midnights from the point of admission due to high intensity of service, high risk for further deterioration and high frequency of surveillance required.Myrl Askew MD [facility] If 7PM-7AM, please contact night-coverage [email or web address]  12/14/2023, 2:43 AM

## 2023-12-14 NOTE — Evaluation (Signed)
 Physical Therapy Evaluation Patient Details Name: Molly Cobb MRN: [id number] DOB: 1931/11/19 Today's Date: 12/14/2023  History of Present Illness  Patient is a 89 year old female whom presents to therapy following hospital admission on 12/14/2023 due to multiple falls. Pt ws found to be hypertensive with systolics in 170s. Imaging negative for acute findings and abn lab findings including hyperkalemia in the setting of kidney dz.  Pt recently presented to hospital on 07/01/2023 with pain in R knee and face after fall. Patient was found to have mild patellar fracture, UTI, and acute metabolic encephalopathy. Pt PMH includes but is not limited to: CKD, CAD, depression, type II DM, gout, hypertension, hypothyroidism, severe aortic stenosis with history of TAVR, history of pacemaker placement, hypothyroidism.  Clinical Impression   Pt admitted with above diagnosis.  Pt currently with functional limitations due to the deficits listed below (see PT Problem List). Pt in bed with B LE off EOB to the L when PT arrived. Pt reports she was trying to get OOB and go to the bathroom. PT noted pt had removed leads from monitors. Pt unable to provide insight to PLOF. 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 Pt required min cues, use of hospital bed and min A for supine to sit, CGA for sit to stand  from EOB to RW, SPT with RW to [facility], CGA for sit to stand  from [facility] and CGA for short amb bout with RW to recliner. Pt required min cues for safety, posture and RW management. Pt left seated in recliner and all needs in place. Patient will benefit from continued inpatient follow up therapy, <3 hours/day. Pt will benefit from acute skilled PT to increase their independence and safety with mobility to allow discharge.         If plan is discharge home, recommend the following: A little help with walking and/or transfers;A lot of help with bathing/dressing/bathroom;Assistance with cooking/housework;Assist for  transportation;Help with stairs or ramp for entrance   Can travel by private vehicle   No    Equipment Recommendations None recommended by PT (defer to next venue)  Recommendations for Other Services       Functional Status Assessment Patient has had a recent decline in their functional status and demonstrates the ability to make significant improvements in function in a reasonable and predictable amount of time.     Precautions / Restrictions Precautions Precautions: Fall;ICD/Pacemaker Restrictions Weight Bearing Restrictions Per Provider Order: No      Mobility  Bed Mobility Overal bed mobility: Needs Assistance Bed Mobility: Supine to Sit     Supine to sit: Min assist, HOB elevated, Used rails     General bed mobility comments: min cues    Transfers Overall transfer level: Needs assistance Equipment used: Rolling walker (2 wheels) Transfers: Bed to chair/wheelchair/BSC, Sit to/from Stand Sit to Stand: Contact guard assist Stand pivot transfers: Contact guard assist         General transfer comment: min cues and pull to stand    Ambulation/Gait Ambulation/Gait assistance: Contact guard assist Gait Distance (Feet): 5 Feet Assistive device: Rolling walker (2 wheels) Gait Pattern/deviations: Step-to pattern, Trunk flexed, Wide base of support Gait velocity: decreased     General Gait Details: slight trunk flexion and noted lateral sway cues for safe RW management  Stairs            Wheelchair Mobility     Tilt Bed    Modified Rankin (Stroke Patients Only)  Balance Overall balance assessment: Needs assistance, History of Falls Sitting-balance support: Feet supported Sitting balance-Leahy Scale: Good     Standing balance support: Bilateral upper extremity supported, During functional activity, Reliant on assistive device for balance Standing balance-Leahy Scale: Poor                               Pertinent Vitals/Pain  Pain Assessment Pain Assessment: No/denies pain    Home Living Family/patient expects to be discharged to:: Skilled nursing facility                   Additional Comments: pt LTC resident at [facility]    Prior Function Prior Level of Function : Needs assist               ADLs Comments: pt is a poor historian and unable to relay home living situation nor PLOF. above taken from prior admission. pt did indicate that she used a wc and a cane     Extremity/Trunk Assessment        Lower Extremity Assessment Lower Extremity Assessment: Generalized weakness    Cervical / Trunk Assessment Cervical / Trunk Assessment: Normal  Communication   Communication Communication: Impaired Factors Affecting Communication: Difficulty expressing self    Cognition Arousal: Alert Behavior During Therapy: WFL for tasks assessed/performed   PT - Cognitive impairments: History of cognitive impairments, No family/caregiver present to determine baseline, Orientation, Awareness, Memory, Attention, Problem solving, Safety/Judgement   Orientation impairments: Place, Time, Situation                   PT - Cognition Comments: 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 Following commands: Impaired Following commands impaired: Follows one step commands inconsistently     Cueing Cueing Techniques: Verbal cues, Gestural cues, Tactile cues, Visual cues     General Comments      Exercises     Assessment/Plan    PT Assessment Patient needs continued PT services  PT Problem List Decreased strength;Decreased activity tolerance;Decreased balance;Decreased mobility;Decreased coordination;Decreased cognition;Decreased safety awareness       PT Treatment  Interventions DME instruction;Gait training;Functional mobility training;Therapeutic activities;Therapeutic exercise;Balance training;Neuromuscular re-education;Cognitive remediation;Patient/family education    PT Goals (Current goals can be found in the Care Plan section)  Acute Rehab PT Goals PT Goal Formulation: Patient unable to participate in goal setting    Frequency Min 2X/week     Co-evaluation               AM-PAC PT 6 Clicks Mobility  Outcome Measure Help needed turning from your back to your side while in a flat bed without using bedrails?: A Little Help needed moving from lying on your back to sitting on the side of a flat bed without using bedrails?: A Little Help needed moving to and from a bed to a chair (including a wheelchair)?: A Little Help needed standing up from a chair using your arms (e.g., wheelchair or bedside chair)?: A Little Help needed to walk in hospital room?: A Little Help needed climbing 3-5 steps with a railing? : Total 6 Click Score: 16    End of Session Equipment Utilized During Treatment: Gait belt Activity Tolerance: Patient limited by fatigue  Patient left: in chair;with call bell/phone within reach;with chair alarm set Nurse Communication: Mobility status PT Visit Diagnosis: Unsteadiness on feet (R26.81);Other abnormalities of gait and mobility (R26.89);Repeated falls (R29.6);Muscle weakness (generalized) (M62.81);Difficulty in walking, not elsewhere classified (R26.2)    Time: 0454-0981 PT Time Calculation (min) (ACUTE ONLY): 23 min   Charges:   PT Evaluation $PT Eval Low Complexity: 1 Low PT Treatments $Therapeutic Activity: 8-22 mins PT General Charges $$ ACUTE PT VISIT: 1 Visit         Cary Clarks, PT Acute Rehab   Annalee Kiang 12/14/2023, 3:23 PM

## 2023-12-14 NOTE — TOC Initial Note (Signed)
 Transition of Care [facility]) - Initial/Assessment Note    Patient Details  Name: Molly Cobb MRN: [id number] Date of Birth: 1932-01-06  [facility]) CM/SW Contact:    Gertha Ku, LCSW Phone Number: 12/14/2023, 2:37 PM  Clinical Narrative:                  Pt rec for PT eval , TOC to follow for recommendations.    Barriers to Discharge: Continued Medical Work up   Patient Goals and CMS Choice            Expected Discharge Plan and Services                                              Prior Living Arrangements/Services                       Activities of Daily Living   ADL Screening (condition at time of admission) Independently performs ADLs?: Yes (appropriate for developmental age) Is the patient deaf or have difficulty hearing?: No Does the patient have difficulty seeing, even when wearing glasses/contacts?: No Does the patient have difficulty concentrating, remembering, or making decisions?: No  Permission Sought/Granted                  Emotional Assessment              Admission diagnosis:  Hyperkalemia [E87.5] Fall, initial encounter [W19.XXXA] Patient Active Problem List   Diagnosis Date Noted   Patella fracture 07/02/2023   UTI (urinary tract infection) 07/02/2023   Acute metabolic encephalopathy 07/02/2023   Metabolic acidosis 07/02/2023   Hyperkalemia 07/02/2023   Adult failure to thrive 09/28/2021   Frequency of micturition 09/22/2021   Hyponatremia 09/21/2021   Severe aortic stenosis 04/12/2017   S/P TAVR (transcatheter aortic valve replacement) 04/12/2017   Chronic kidney disease (CKD), stage V (HCC) 03/23/2017   AVM (arteriovenous malformation) of stomach, acquired with hemorrhage    S/P placement of cardiac pacemaker 02/16/2017   Anemia in chronic renal disease 02/13/2017   Hypothyroidism (acquired) 06/29/2016   Type 2 diabetes mellitus with stage 4 chronic kidney disease, without  long-term current use of insulin  (HCC) 09/09/2015   Benign hypertension with CKD (chronic kidney disease) stage IV (HCC) 08/02/2015   PCP:  Sindy Dues, MD Pharmacy:   [facility] [address] [place], [place] - [address] [place] [address] Phone: [phone] Fax: [phone]  [facility] Delivery - [place], [place] - [address] [place] [address] Phone: [phone] Fax: [phone]     Social Drivers of Health (SDOH) Social History: SDOH Screenings   Food Insecurity: No Food Insecurity (12/14/2023)  Housing: Low Risk  (12/14/2023)  Transportation Needs: No Transportation Needs (12/14/2023)  Utilities: Not At Risk (12/14/2023)  Financial Resource Strain: Low Risk  (01/11/2022)   Received from [facility] visits prior to 08/28/2022., [facility]  Physical Activity: Unknown (01/11/2022)   [facility] visits prior to 08/28/2022.  Social Connections: Socially Isolated (12/14/2023)  Stress: Stress Concern Present (01/11/2022)   Received from [facility] visits prior to 08/28/2022., [facility]  Tobacco Use: Low Risk  (12/14/2023)   SDOH Interventions:  Readmission Risk Interventions    07/05/2023    2:24 PM 09/24/2021   11:03 AM  Readmission Risk Prevention Plan  Transportation Screening Complete Complete  PCP or Specialist Appt within 3-5 Days Complete Complete  HRI or Home Care Consult Complete Complete  Social Work Consult for Recovery Care Planning/Counseling Complete Complete  Palliative Care Screening Not Applicable Not Applicable  Medication Review Oceanographer) Complete Complete

## 2023-12-14 NOTE — Progress Notes (Signed)
 Hypoglycemic Event  CBG: 67  Treatment: 8 oz juice/soda  Symptoms: None  Follow-up CBG: Time:15 CBG Result:91  Possible Reasons for Event: Inadequate meal intake  Comments/MD notified:    Archer Vise

## 2023-12-15 DIAGNOSIS — E875 Hyperkalemia: Secondary | ICD-10-CM | POA: Diagnosis not present

## 2023-12-15 LAB — CBC WITH DIFFERENTIAL/PLATELET
Abs Immature Granulocytes: 0.06 10*3/uL (ref 0.00–0.07)
Basophils Absolute: 0 10*3/uL (ref 0.0–0.1)
Basophils Relative: 0 %
Eosinophils Absolute: 0.3 10*3/uL (ref 0.0–0.5)
Eosinophils Relative: 4 %
HCT: 28.8 % — ABNORMAL LOW (ref 36.0–46.0)
Hemoglobin: 8.7 g/dL — ABNORMAL LOW (ref 12.0–15.0)
Immature Granulocytes: 1 %
Lymphocytes Relative: 15 %
Lymphs Abs: 1.3 10*3/uL (ref 0.7–4.0)
MCH: 27.8 pg (ref 26.0–34.0)
MCHC: 30.2 g/dL (ref 30.0–36.0)
MCV: 92 fL (ref 80.0–100.0)
Monocytes Absolute: 0.9 10*3/uL (ref 0.1–1.0)
Monocytes Relative: 11 %
Neutro Abs: 5.8 10*3/uL (ref 1.7–7.7)
Neutrophils Relative %: 69 %
Platelets: 155 10*3/uL (ref 150–400)
RBC: 3.13 MIL/uL — ABNORMAL LOW (ref 3.87–5.11)
RDW: 16.2 % — ABNORMAL HIGH (ref 11.5–15.5)
WBC: 8.4 10*3/uL (ref 4.0–10.5)
nRBC: 0 % (ref 0.0–0.2)

## 2023-12-15 LAB — BASIC METABOLIC PANEL WITH GFR
Anion gap: 11 (ref 5–15)
BUN: 67 mg/dL — ABNORMAL HIGH (ref 8–23)
CO2: 21 mmol/L — ABNORMAL LOW (ref 22–32)
Calcium: 8.8 mg/dL — ABNORMAL LOW (ref 8.9–10.3)
Chloride: 105 mmol/L (ref 98–111)
Creatinine, Ser: 3.99 mg/dL — ABNORMAL HIGH (ref 0.44–1.00)
GFR, Estimated: 10 mL/min — ABNORMAL LOW (ref >60)
Glucose, Bld: 104 mg/dL — ABNORMAL HIGH (ref 70–99)
Potassium: 4.9 mmol/L (ref 3.5–5.1)
Sodium: 137 mmol/L (ref 135–145)

## 2023-12-15 LAB — GLUCOSE, CAPILLARY
Glucose-Capillary: 95 mg/dL (ref 70–99)
Glucose-Capillary: 167 mg/dL — ABNORMAL HIGH (ref 70–99)
Glucose-Capillary: 162 mg/dL — ABNORMAL HIGH (ref 70–99)
Glucose-Capillary: 162 mg/dL — ABNORMAL HIGH (ref 70–99)

## 2023-12-15 MED ORDER — LORAZEPAM 2 MG/ML IJ SOLN
0.5000 mg | Freq: Once | INTRAMUSCULAR | Status: AC
  Administered 2023-12-15: 0.5 mg via INTRAVENOUS
  Filled 2023-12-15: qty 1

## 2023-12-15 MED ORDER — SODIUM CHLORIDE 0.9 % IV SOLN: INTRAVENOUS | Status: AC

## 2023-12-15 MED ORDER — HALOPERIDOL LACTATE 5 MG/ML IJ SOLN
5.0000 mg | Freq: Four times a day (QID) | INTRAMUSCULAR | Status: DC | PRN
  Administered 2023-12-15: 5 mg via INTRAVENOUS
  Filled 2023-12-15: qty 1

## 2023-12-15 NOTE — Progress Notes (Signed)
 RN reached out to MD to see if patient could get something for agitation. Patient had been yelling out, wanting to get up, and kept on insisting on leaving. Patient confused, which patient has been. RN has tried to redirect patient but patient kept asking if someone is going to take her back to her apartment. MD placed new order.

## 2023-12-15 NOTE — TOC Progression Note (Signed)
 Transition of Care [facility]) - Progression Note    Patient Details  Name: Molly Cobb MRN: [id number] Date of Birth: [birth year]  Transition of Care [facility]) CM/SW Contact  Gertha Ku, LCSW Phone Number: 12/15/2023, 11:24 AM  Clinical Narrative:     CSW spoke with the pt's daughter, Molly Cobb, to discuss recommendations for SNF placement. The pt's daughter reports the pt is from [facility]. She reports the pt is receiving home health services through [place]. The pt's daughter reports she would like to wait to see how the pt progresses before deciding on SNF placement. The pt's daughter reports she would prefer the pt to return back to the ALF but is open to SNF if the pt really needs it. The pt's daughter is requesting to speak with palliative services. CSW messaged the MD to place a consult for palliative services to see the pt here at the hospital. TOC to follow.  Expected Discharge Plan: Assisted Living Barriers to Discharge: Continued Medical Work up  Expected Discharge Plan and Services       Living arrangements for the past 2 months: Assisted Living Facility                                       Social Determinants of Health (SDOH) Interventions SDOH Screenings   Food Insecurity: No Food Insecurity (12/14/2023)  Housing: Low Risk  (12/14/2023)  Transportation Needs: No Transportation Needs (12/14/2023)  Utilities: Not At Risk (12/14/2023)  Financial Resource Strain: Low Risk  (01/11/2022)   [facility] visits prior to 08/28/2022[facility]  Physical Activity: Unknown (01/11/2022)   [facility] prior to 08/28/2022.  Social Connections: Socially Isolated (12/14/2023)  Stress: Stress Concern Present (01/11/2022)   [facility] prior to 08/28/2022., [facility]  Tobacco Use: Low Risk  (12/14/2023)     Readmission Risk Interventions    07/05/2023    2:24 PM 09/24/2021   11:03 AM  Readmission Risk Prevention Plan  Transportation Screening Complete Complete  PCP or Specialist Appt within 3-5 Days Complete Complete  HRI or Home Care Consult Complete Complete  Social Work Consult for Recovery Care Planning/Counseling Complete Complete  Palliative Care Screening Not Applicable Not Applicable  Medication Review Ocean[employer]) Complete Complete
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
## 2023-12-15 NOTE — Progress Notes (Signed)
 MD notified that patient's BP was 112/40. MAP 61. RN had to restart patient's IV fluids because patient pulled out IV.

## 2023-12-16 DIAGNOSIS — Z515 Encounter for palliative care: Secondary | ICD-10-CM | POA: Diagnosis not present

## 2023-12-16 DIAGNOSIS — Z7189 Other specified counseling: Secondary | ICD-10-CM | POA: Diagnosis not present

## 2023-12-16 DIAGNOSIS — W19XXXA Unspecified fall, initial encounter: Secondary | ICD-10-CM

## 2023-12-16 DIAGNOSIS — E875 Hyperkalemia: Secondary | ICD-10-CM | POA: Diagnosis not present

## 2023-12-16 DIAGNOSIS — N179 Acute kidney failure, unspecified: Secondary | ICD-10-CM

## 2023-12-16 DIAGNOSIS — F0150 Vascular dementia without behavioral disturbance: Secondary | ICD-10-CM

## 2023-12-16 LAB — GLUCOSE, CAPILLARY
Glucose-Capillary: 105 mg/dL — ABNORMAL HIGH (ref 70–99)
Glucose-Capillary: 96 mg/dL (ref 70–99)
Glucose-Capillary: 165 mg/dL — ABNORMAL HIGH (ref 70–99)
Glucose-Capillary: 167 mg/dL — ABNORMAL HIGH (ref 70–99)
Glucose-Capillary: 194 mg/dL — ABNORMAL HIGH (ref 70–99)

## 2023-12-16 LAB — BASIC METABOLIC PANEL WITH GFR
Anion gap: 8 (ref 5–15)
BUN: 67 mg/dL — ABNORMAL HIGH (ref 8–23)
CO2: 22 mmol/L (ref 22–32)
Calcium: 8.3 mg/dL — ABNORMAL LOW (ref 8.9–10.3)
Chloride: 105 mmol/L (ref 98–111)
Creatinine, Ser: 4.02 mg/dL — ABNORMAL HIGH (ref 0.44–1.00)
GFR, Estimated: 10 mL/min — ABNORMAL LOW (ref >60)
Glucose, Bld: 144 mg/dL — ABNORMAL HIGH (ref 70–99)
Potassium: 4.5 mmol/L (ref 3.5–5.1)
Sodium: 135 mmol/L (ref 135–145)

## 2023-12-16 MED ORDER — QUETIAPINE FUMARATE 25 MG PO TABS
25.0000 mg | ORAL_TABLET | ORAL | Status: DC
  Administered 2023-12-16 – 2023-12-19 (×4): 25 mg via ORAL
  Filled 2023-12-16 (×4): qty 1

## 2023-12-16 MED ORDER — SODIUM CHLORIDE 0.9 % IV SOLN: INTRAVENOUS | Status: AC

## 2023-12-16 NOTE — Consult Note (Signed)
 Palliative Care Consult Note                                  Date: 12/16/2023   Patient Name: Molly Cobb  DOB: 11/14/1931  MRN: [id number]  Age / Sex: 89 y.o., female  PCP: Molly Richerd Ee, MD Referring Physician: Vernon Ranks, MD  Reason for Consultation: Establishing goals of care  HPI/Patient Profile: 89 y.o. female  with past medical history of CAD, vascular dementia, CKD stage V (baseline creatinine 3.0-3.6), type 2 diabetes, aortic stenosis status post TAVR, hypertension, and hypothyroidism who presented to the ED on 12/13/2023 after a fall.  Patient reportedly having multiple falls over the last several days.  In the ED, labs significant for potassium 6.8 and creatinine 4.2.  Imaging showed no evidence of fracture.  CT head showed no acute findings. Patient is admitted with hyperkalemia and AKI on CKD.   Palliative Medicine has been consulted for goals of care discussions. Patient and family are faced with anticipatory care needs and complex medical decision making.    Clinical Assessment and Goals of Care:   Extensive chart review has been completed including labs, vital signs, imaging, progress/consult notes, orders, medications and available advance directive documents.    Update received from RN.  Patient assessed at bedside.  She was agitated overnight, received prn IV Ativan  at 23:25 last night, and has been sleeping most of the day.  I met with her Molly Cobb and Molly Cobb to discuss diagnosis, prognosis, and GOC. I introduced Palliative Medicine as specialized medical care for people living with serious illness.   Created space and opportunity for Molly Cobb to express thoughts and feelings regarding current medical situation. Values and goals of care were attempted to be elicited.  Functional Status: Patient has lived at [facility] since November 2024, first in ILF, then transitioning to ALF in April 2025.  At  baseline, she is ambulatory with a walker and requires assistance with basic ADLs.   Molly Cobb reports that patient has good days where she participates in social activities at ALF, as well as bad days where she wants to stay in bed all day and states that she is tired and doesn't want to be here anymore.  Molly Cobb feels that her mother has more good days than bad, and overall continues to have decent quality of life.  Discussion: We discussed patient's current illness and what it means in the larger context of her ongoing co-morbidities.  We reviewed that patient is admitted with severe progression of her chronic illness, specifically AKI on CKD.  We also reviewed that patient has vascular dementia, which is a progressive and noncurable illness underlying her acute medical conditions  Discussion on the difference between Palliative care and Hospice was had per family request. Explained that palliative care may be offered during any phase of a serious illness and can occur in conjunction with ongoing medical interventions/treatment, while hospice care is focused on comfort and is offered when a person is expected to live for 6 months or less.   For now, Molly Cobb wishes to continue to treat the treatable and allow her mother every opportunity to thrive.  However, she is clear that she is not interested in dialysis if patient's kidney function worsens. She is agreeable to outpatient palliative referral, with the understanding they can transition to hospice care in the future.   We completed a MOST form today. Molly Cobb has outlined  her wishes for the following treatment decisions:  Cardiopulmonary Resuscitation: Do Not Attempt Resuscitation (DNR/No CPR)  Medical Interventions: Limited Additional Interventions: Use medical treatment, IV fluids and cardiac monitoring as indicated, DO NOT USE intubation or mechanical ventilation. May consider use of less invasive airway support such as BiPAP or CPAP.  Also provide comfort measures. Transfer to the hospital if indicated. Avoid intensive care.   Antibiotics: Determine use of limitation of antibiotics when infection occurs  IV Fluids: IV fluids for a defined trial period  Feeding Tube: No feeding tube    Discussed the importance of continued conversation with the medical team regarding overall plan of care and treatment options. Questions and concerns addressed. Hard choices book and PMT contact info provided.   Review of Systems  Unable to perform ROS  Advanced Care Planning:   Pertinent diagnosis: chronic kidney disease, vascular dementia  The patient and/or family consented to a voluntary Advance Care Planning Conversation in person. Individuals present for the conversation: Molly Cobb/Molly Cobb  Summary of the conversation: We discussed code status and scopes of care. We discussed the difference between full scope versus limited interventions versus comfort care.   Outcome of the conversations and/or documents completed: - Code status changed to DNR/DNI  - MOST form completed  I spent  25 minutes providing separately identifiable ACP services with the patient and/or surrogate decision maker in a voluntary, in-person conversation discussing the patient's wishes and goals as detailed in the above note.   Objective:   Primary Diagnoses: Present on Admission:  Hyperkalemia  Anemia in chronic renal disease  Hypothyroidism (acquired)   Physical Exam Vitals reviewed.  Constitutional:      General: She is sleeping. She is not in acute distress.    Comments: Frail and chronically ill-appearing  Pulmonary:     Effort: No respiratory distress.    Palliative Assessment/Data: PPS 40%     Assessment & Plan:   SUMMARY OF RECOMMENDATIONS   Code status changed to DNR/DNI Goal of care is continue to treat the treatable and return to ALF when medically stable Molly Cobb does not wish to pursue dialysis if patient's kidney  function worsens MOST form completed - original placed on shadow chart and copy made to scanned into EMR Outpatient palliative at discharge PMT will continue to follow  Primary Decision Maker: HCPOA Molly Cobb  Existing Vynca/ACP Documentation: HCPOA document naming Cobb Shams as primary healthcare agent and Rhonda Mebane as alternate Living will stating patient's desire not to have life-prolonging measures in the event of an incurable condition, unconsciousness, or advanced dementia.  Symptom Management:  Per attending  Prognosis:  Unable to determine  Discharge Planning:  To Be Determined     Thank you for allowing us  to participate in the care of [facility]  Billing based on MDM: High  Problems Addressed: One or more chronic illnesses with severe exacerbation, progression, or side effects of treatment.  Amount and/or Complexity of Data: Category 1:Review of prior external note(s) from each unique source, Review of the result(s) of each unique test, and Assessment requiring an independent historian(s), Category 2:Independent interpretation of a test performed by another physician/other qualified health care professional (not separately reported), and Category 3:Discussion of management or test interpretation with external physician/other qualified health care professional/appropriate source (not separately reported)    Detailed review of medical records (labs, imaging, vital signs), medically appropriate exam, discussed with treatment team, counseling and education to patient, family, & staff, documenting clinical information, coordination of care.  Signed by: Recardo Loll, NP Palliative Medicine Team  Team Phone # [phone]  For individual providers, please see AMION

## 2023-12-16 NOTE — Progress Notes (Signed)
 PROGRESS NOTE    Dahlia Nifong  ZOX:[id number] DOB: Feb 11, 1932 DOA: 12/13/2023 PCP: Sindy Dues, MD   Brief Narrative:  HPI: Molly Cobb is a 89 y.o. female with medical history significant of CKD, CAD, type 2 diabetes, hypertension, aortic stenosis status post TAVR who presented to the emergency department after a fall.  Patient was having multiple falls over the last several days due to losing her balance.  She presented to the ER where she was found to be hypertensive with systolics in the 170s.  Labs were obtained which showed potassium 6.8, creatinine 4.2, WBC 8.6, hemoglobin 8.8, urinalysis negative for infection.  Patient underwent CT spine which showed no evidence of fracture.  CT head showed no acute findings.  X-ray of shoulder showed no acute fractures.   Assessment & Plan:   Principal Problem:   Hyperkalemia Active Problems:   Hypothyroidism (acquired)   Anemia in chronic renal disease  Generalized weakness/mechanical fall: CT and x-rays with no evidence of fracture.  She uses walker to get around.  She typically ambulates independently.  Seen by PT OT, SNF recommended, TOC consulted.  Daughter has not made a decision about SNF yet.  Hyperkalemia: Presented with 6.8.  Received dextrose , Lasix , insulin  and Lokelma .  Down to 5.8.  Continued on Lokelma , potassium normalized now.   AKI on CKD stage V: Patient's baseline creatinine appears to be between 3.4-3.8.  Presented with a 4.27.  Improved to 3.99.  She still appears clinically dehydrated.  Tends to sleep for most of the time so I doubt she is eating and drinking enough.  Will give her fluids for another 12 hours.  Type 2 diabetes mellitus: Hemoglobin A1c 7.5, 5 months ago.  PTA glipizide  and Tradjenta .  Hold both of them.  Continue SSI.  Essential hypertension: Blood pressure controlled.  Continue Coreg  and Imdur .  Acquired hypothyroidism: Continue Synthroid .  History of severe aortic stenosis status post TAVR:  Continue aspirin .  Hyperlipidemia: Continue atorvastatin .  Anemia of chronic disease/due to CKD: Stable.  Delirium: Last evening, patient was confused.  She was agitated and required Haldol.  Now patient is fully alert and oriented.  Will start on Seroquel tonight.  Continue as needed Haldol for agitation only. 1. Avoid benzodiazepines, antihistamines, anticholinergics, and minimize opiate use as these may worsen delirium. 2: Assess, prevent and manage pain as lack of treatment can result in delirium.  3: Provide appropriate lighting and clear signage; a clock and calendar should be easily visible to the patient. 4: Monitor environmental factors. Reduce light and noise at night (close shades, turn off lights, turn off TV, ect). Correct any alterations in sleep cycle. 5: Reorient the patient to person, place, time and situation on each encounter.  6: Correct sensory deficits if possible (replace eye glasses, hearing aids, ect). 7: Avoid restraints if able. Severely delirious patients benefit from constant observation by a sitter.  DVT prophylaxis: enoxaparin  (LOVENOX ) injection 30 mg Start: 12/14/23 1000 SCDs Start: 12/14/23 0238   Code Status: Full Code  Family Communication:  None present at bedside.   Status is: Inpatient Remains inpatient appropriate because: Still dehydrated, needs IV fluids.   Estimated body mass index is 33.13 kg/m as calculated from the following:   Height as of this encounter: 5' 3 (1.6 m).   Weight as of this encounter: 84.8 kg.    Nutritional Assessment: Body mass index is 33.13 kg/m.Aaron Aas Seen by dietician.  I agree with the assessment and plan as outlined below: Nutrition Status:        .  Skin Assessment: I have examined the patient's skin and I agree with the wound assessment as performed by the wound care RN as outlined below:    Consultants:  None  Procedures:  None  Antimicrobials:  Anti-infectives (From admission, onward)    None          Subjective: Seen and examined.  No complaints.  Still appears to be very weak.  Fully alert and oriented.  Objective: Vitals:   12/15/23 1435 12/15/23 1617 12/15/23 1936 12/16/23 0254  BP: (!) 112/40 (!) 134/52 (!) 130/47 (!) 158/57  Pulse: 70  69 70  Resp:   18 16  Temp:   98.3 F (36.8 C) 98.1 F (36.7 C)  TempSrc:    Oral  SpO2:   99% 100%  Weight:      Height:        Intake/Output Summary (Last 24 hours) at 12/16/2023 1610 Last data filed at 12/16/2023 0600 Gross per 24 hour  Intake 2277.28 ml  Output 1950 ml  Net 327.28 ml   Filed Weights   12/14/23 0634  Weight: 84.8 kg    Examination:  General exam: Appears calm and comfortable but very weak and sleepy. Respiratory system: Clear to auscultation. Respiratory effort normal. Cardiovascular system: S1 & S2 heard, RRR. No JVD, murmurs, rubs, gallops or clicks. No pedal edema. Gastrointestinal system: Abdomen is nondistended, soft and nontender. No organomegaly or masses felt. Normal bowel sounds heard. Central nervous system: Alert and oriented. No focal neurological deficits. Extremities: Symmetric 5 x 5 power. Skin: No rashes, lesions or ulcers.  Psychiatry: Judgement and insight appear normal. Mood & affect appropriate.    Data Reviewed: I have personally reviewed following labs and imaging studies  CBC: Recent Labs  Lab 12/13/23 2038 12/14/23 0406 12/15/23 0420  WBC 8.6 9.5 8.4  NEUTROABS  --   --  5.8  HGB 8.8* 9.2* 8.7*  HCT 29.4* 30.6* 28.8*  MCV 94.2 94.7 92.0  PLT 152 161 155   Basic Metabolic Panel: Recent Labs  Lab 12/13/23 2038 12/14/23 0406 12/14/23 2126 12/15/23 0420 12/16/23 0355  NA 137 138  --  137 135  K 6.8* 5.8* 5.3* 4.9 4.5  CL 108 109  --  105 105  CO2 19* 20*  --  21* 22  GLUCOSE 199* 76  --  104* 144*  BUN 69* 69*  --  67* 67*  CREATININE 4.27* 4.07*  --  3.99* 4.02*  CALCIUM  8.7* 8.9  --  8.8* 8.3*   GFR: Estimated Creatinine Clearance: 9.6 mL/min (A)  (by C-G formula based on SCr of 4.02 mg/dL (H)). Liver Function Tests: Recent Labs  Lab 12/13/23 2038  AST 28  ALT 16  ALKPHOS 111  BILITOT 0.7  PROT 6.6  ALBUMIN  3.2*   No results for input(s): LIPASE, AMYLASE in the last 168 hours. No results for input(s): AMMONIA in the last 168 hours. Coagulation Profile: No results for input(s): INR, PROTIME in the last 168 hours. Cardiac Enzymes: No results for input(s): CKTOTAL, CKMB, CKMBINDEX, TROPONINI in the last 168 hours. BNP (last 3 results) No results for input(s): PROBNP in the last 8760 hours. HbA1C: Recent Labs    12/14/23 0406  HGBA1C 7.1*   CBG: Recent Labs  Lab 12/15/23 0746 12/15/23 1117 12/15/23 1615 12/15/23 2033 12/16/23 0752  GLUCAP 95 167* 162* 162* 105*   Lipid Profile: No results for input(s): CHOL, HDL, LDLCALC, TRIG, CHOLHDL, LDLDIRECT in the last 72 hours. Thyroid  Function Tests: No  results for input(s): TSH, T4TOTAL, FREET4, T3FREE, THYROIDAB in the last 72 hours. Anemia Panel: No results for input(s): VITAMINB12, FOLATE, FERRITIN, TIBC, IRON, RETICCTPCT in the last 72 hours. Sepsis Labs: No results for input(s): PROCALCITON, LATICACIDVEN in the last 168 hours.  Recent Results (from the past 240 hours)  MRSA Next Gen by PCR, Nasal     Status: None   Collection Time: 12/14/23  4:48 AM   Specimen: Nasal Mucosa; Nasal Swab  Result Value Ref Range Status   MRSA by PCR Next Gen NOT DETECTED NOT DETECTED Final    Comment: (NOTE) The GeneXpert MRSA Assay (FDA approved for NASAL specimens only), is one component of a comprehensive MRSA colonization surveillance program. It is not intended to diagnose MRSA infection nor to guide or monitor treatment for MRSA infections. Test performance is not FDA approved in patients less than 55 years old. Performed at [facility], [address] [place], [place] [address]       Radiology Studies: No results found.   Scheduled Meds:  aspirin   81 mg Oral Daily   atorvastatin   40 mg Oral q morning   carvedilol   12.5 mg Oral BID WC   divalproex  125 mg Oral Q0600   enoxaparin  (LOVENOX ) injection  30 mg Subcutaneous Q24H   escitalopram   10 mg Oral q morning   ezetimibe   10 mg Oral q morning   insulin  aspart  0-5 Units Subcutaneous QHS   insulin  aspart  0-9 Units Subcutaneous TID WC   isosorbide  mononitrate  30 mg Oral q morning   levothyroxine   100 mcg Oral Q0600   lidocaine   1 patch Transdermal Q24H   pantoprazole   40 mg Oral Daily   pneumococcal 20-valent conjugate vaccine  0.5 mL Intramuscular Tomorrow-1000   sodium bicarbonate   650 mg Oral TID   traZODone   25 mg Oral QHS   Continuous Infusions:  sodium chloride        LOS: 2 days   Modena Andes, MD [facility]  12/16/2023, 8:22 AM   *Please note that this is a verbal dictation therefore any spelling or grammatical errors are due to the Dragon Medical One system interpretation.  Please page via Amion and do not message via secure chat for urgent patient care matters. Secure chat can be used for non urgent patient care matters.  How to contact the [facility] Attending or Consulting provider 7A - 7P or covering provider during after hours 7P -7A, for this patient?  Check the care team in [facility] and look for a) attending/consulting [facility] provider listed and b) the [facility] team listed. Page or secure chat 7A-7P. Log into [email or web address] and use [facility] universal password to access. If you do not have the password, please contact the hospital operator. Locate the [facility] provider you are looking for under [facility] and page to a number that you can be directly reached. If you still have difficulty reaching the provider, please page the [facility] (Director on Call) for the Hospitalists listed on amion for assistance.

## 2023-12-16 NOTE — Progress Notes (Signed)
 At 1400: MD notified that patient's BP 118/37 MAP 62. Patient lightly arouseable and very drowsy. MD made aware that the night shift RN gave patient IV ativan last night because patient was getting agitated since the haldol did not seem to help. Patient was alert enough though to take morning meds.   At 1730: Patient had woken up and was eating dinner.

## 2023-12-16 NOTE — TOC Progression Note (Signed)
 Transition of Care [facility]) - Progression Note   Patient Details  Name: Molly Cobb MRN: [id number] Date of Birth: [birth year]  [facility] CM/SW Contact  Zenon Hilda, LCSW Phone Number: 12/16/2023, 3:48 PM  Clinical Narrative: CSW followed up with daughter, Susannah Epp, regarding SNF recommendation. Daughter reported she still prefers the patient to return to [facility]F with [facility], so SNF was declined again. [facility] does not allow weekend admissions.  Expected Discharge Plan: Assisted Living Barriers to Discharge: Continued Medical Work up  Expected Discharge Plan and Services Living arrangements for the past 2 months: Assisted Living Facility  Social Determinants of Health (SDOH) Interventions SDOH Screenings   Food Insecurity: No Food Insecurity (12/14/2023)  Housing: Low Risk  (12/14/2023)  Transportation Needs: No Transportation Needs (12/14/2023)  Utilities: Not At Risk (12/14/2023)  Financial Resource Strain: Low Risk  (01/11/2022)   [facility] from [facility] prior to [facility]28[facility]  Physical Activity: Unknown (01/11/2022)   [facility] prior to 08/28/2022.  Social Connections: Socially Isolated (12/14/2023)  Stress: Stress Concern Present (01/11/2022)   [facility] from [facility] visits prior to 08/28/2022., [facility]  Tobacco Use: Low Risk  (12/14/2023)   Readmission Risk Interventions    07/05/2023    2:24 PM 09/24/2021   11:03 AM  Readmission Risk Prevention Plan  Transportation Screening Complete Complete  PCP or Specialist Appt within 3-5 Days Complete Complete  HRI or Home Care Consult Complete Complete  Social Work Consult for Recovery Care Planning/Counseling Complete Complete  Palliative Care Screening Not Applicable Not Applicable  Medication Review Oceanographer) Complete Complete

## 2023-12-16 NOTE — Progress Notes (Addendum)
 PT Cancellation Note  Patient Details Name: Molly Cobb MRN: [id number] DOB: 1931-12-24   Cancelled Treatment:    Reason Eval/Treat Not Completed: Fatigue/lethargy limiting ability to participate. PT arrived 1053 and pt sleeping in bed, PT unable to rouse pt with verbal and tactile cues. Per nursing notes pt exhibited perceived behaviors and confusion overnight and was provided with IV ativan. PT to return as schedule allows and to continue to follow acutely.  PT returned 1347 and pt continues to sleep, unable to rouse with verbal and tactile cues to engage with therapy. PT to continue to follow acutely.   Cary Clarks, PT Acute Rehab   Annalee Kiang 12/16/2023, [phone]

## 2023-12-16 NOTE — Progress Notes (Signed)
 Patient had a dose of IV Haldol around change of shift last night. Per night shift RN, the IV Haldol did not help. The night shift RN had to reach out to the on-call provider to see if patient could get something else for agitation. Patient was trying to get up out of the bed and was really confused. The on-call provider put in a one-time dose of IV Ativan 0.5 mg at 2305. Per night shift RN, the IV Ativan seemed to help.

## 2023-12-17 DIAGNOSIS — E875 Hyperkalemia: Secondary | ICD-10-CM | POA: Diagnosis not present

## 2023-12-17 LAB — GLUCOSE, CAPILLARY
Glucose-Capillary: 104 mg/dL — ABNORMAL HIGH (ref 70–99)
Glucose-Capillary: 96 mg/dL (ref 70–99)
Glucose-Capillary: 104 mg/dL — ABNORMAL HIGH (ref 70–99)
Glucose-Capillary: 217 mg/dL — ABNORMAL HIGH (ref 70–99)

## 2023-12-17 LAB — CBC WITH DIFFERENTIAL/PLATELET
Abs Immature Granulocytes: 0.05 10*3/uL (ref 0.00–0.07)
Basophils Absolute: 0 10*3/uL (ref 0.0–0.1)
Basophils Relative: 0 %
Eosinophils Absolute: 0.2 10*3/uL (ref 0.0–0.5)
Eosinophils Relative: 2 %
HCT: 26.2 % — ABNORMAL LOW (ref 36.0–46.0)
Hemoglobin: 8 g/dL — ABNORMAL LOW (ref 12.0–15.0)
Immature Granulocytes: 1 %
Lymphocytes Relative: 19 %
Lymphs Abs: 1.6 10*3/uL (ref 0.7–4.0)
MCH: 28.4 pg (ref 26.0–34.0)
MCHC: 30.5 g/dL (ref 30.0–36.0)
MCV: 92.9 fL (ref 80.0–100.0)
Monocytes Absolute: 1 10*3/uL (ref 0.1–1.0)
Monocytes Relative: 12 %
Neutro Abs: 5.7 10*3/uL (ref 1.7–7.7)
Neutrophils Relative %: 66 %
Platelets: 131 10*3/uL — ABNORMAL LOW (ref 150–400)
RBC: 2.82 MIL/uL — ABNORMAL LOW (ref 3.87–5.11)
RDW: 16.1 % — ABNORMAL HIGH (ref 11.5–15.5)
WBC: 8.5 10*3/uL (ref 4.0–10.5)
nRBC: 0 % (ref 0.0–0.2)

## 2023-12-17 LAB — BASIC METABOLIC PANEL WITH GFR
Anion gap: 10 (ref 5–15)
BUN: 69 mg/dL — ABNORMAL HIGH (ref 8–23)
CO2: 20 mmol/L — ABNORMAL LOW (ref 22–32)
Calcium: 8.2 mg/dL — ABNORMAL LOW (ref 8.9–10.3)
Chloride: 109 mmol/L (ref 98–111)
Creatinine, Ser: 4.08 mg/dL — ABNORMAL HIGH (ref 0.44–1.00)
GFR, Estimated: 10 mL/min — ABNORMAL LOW (ref >60)
Glucose, Bld: 117 mg/dL — ABNORMAL HIGH (ref 70–99)
Potassium: 4.5 mmol/L (ref 3.5–5.1)
Sodium: 139 mmol/L (ref 135–145)

## 2023-12-17 MED ORDER — BISACODYL 10 MG RE SUPP
10.0000 mg | Freq: Once | RECTAL | Status: AC
  Administered 2023-12-17: 10 mg via RECTAL
  Filled 2023-12-17: qty 1

## 2023-12-17 NOTE — Plan of Care (Signed)
  Problem: Clinical Measurements: Goal: Diagnostic test results will improve 12/17/2023 1614 by Celestia Recardo BROCKS, RN Outcome: Progressing 12/17/2023 1517 by Celestia Recardo BROCKS, RN Outcome: Progressing   Problem: Safety: Goal: Ability to remain free from injury will improve 12/17/2023 1614 by Celestia Recardo BROCKS, RN Outcome: Progressing 12/17/2023 1517 by Celestia Recardo BROCKS, RN Outcome: Progressing   Problem: Fluid Volume: Goal: Ability to maintain a balanced intake and output will improve Outcome: Progressing   Problem: Skin Integrity: Goal: Risk for impaired skin integrity will decrease Outcome: Progressing   Problem: Activity: Goal: Risk for activity intolerance will decrease Outcome: Not Progressing   Problem: Nutrition: Goal: Adequate nutrition will be maintained Outcome: Not Progressing

## 2023-12-17 NOTE — Progress Notes (Signed)
 PROGRESS NOTE    Molly Cobb  FMW:[id number] DOB: 08-24-1931 DOA: 12/13/2023 PCP: Fredricka Richerd Ee, MD   Brief Narrative:  HPI: Molly Cobb is a 89 y.o. female with medical history significant of CKD, CAD, type 2 diabetes, hypertension, aortic stenosis status post TAVR who presented to the emergency department after a fall.  Patient was having multiple falls over the last several days due to losing her balance.  She presented to the ER where she was found to be hypertensive with systolics in the 170s.  Labs were obtained which showed potassium 6.8, creatinine 4.2, WBC 8.6, hemoglobin 8.8, urinalysis negative for infection.  Patient underwent CT spine which showed no evidence of fracture.  CT head showed no acute findings.  X-ray of shoulder showed no acute fractures.   Assessment & Plan:   Principal Problem:   Hyperkalemia Active Problems:   Hypothyroidism (acquired)   Anemia in chronic renal disease  Generalized weakness/mechanical fall: CT and x-rays with no evidence of fracture.  She uses walker to get around.  She typically ambulates independently.  Seen by PT OT, SNF recommended, TOC consulted.  Per TOC, daughter has made a decision to take patient back to assisted living facility however ALF will not accept patient back over the weekend.  I tried calling patient's daughter to discuss disposition and my concerns about that and left a voicemail.  Hyperkalemia: Presented with 6.8.  Received dextrose , Lasix , insulin  and Lokelma .  Down to 5.8.  Continued on Lokelma , potassium normalized now.   AKI on CKD stage V: Patient's baseline creatinine appears to be between 3.4-3.8.  Presented with a 4.27.  Creatinine has remained stable around 4 for last 4 days.  This is her new baseline.  Type 2 diabetes mellitus: Hemoglobin A1c 7.5, 5 months ago.  PTA glipizide  and Tradjenta .  Hold both of them.  Continue SSI.  Essential hypertension: Blood pressure controlled.  Continue Coreg  and  Imdur .  Acquired hypothyroidism: Continue Synthroid .  History of severe aortic stenosis status post TAVR: Continue aspirin .  Hyperlipidemia: Continue atorvastatin .  Anemia of chronic disease/due to CKD: Stable.  Delirium: No more events of agitation.  Continue nightly Seroquel . 1. Avoid benzodiazepines, antihistamines, anticholinergics, and minimize opiate use as these may worsen delirium. 2: Assess, prevent and manage pain as lack of treatment can result in delirium.  3: Provide appropriate lighting and clear signage; a clock and calendar should be easily visible to the patient. 4: Monitor environmental factors. Reduce light and noise at night (close shades, turn off lights, turn off TV, ect). Correct any alterations in sleep cycle. 5: Reorient the patient to person, place, time and situation on each encounter.  6: Correct sensory deficits if possible (replace eye glasses, hearing aids, ect). 7: Avoid restraints if able. Severely delirious patients benefit from constant observation by a sitter.  DVT prophylaxis: enoxaparin  (LOVENOX ) injection 30 mg Start: 12/14/23 1000 SCDs Start: 12/14/23 0238   Code Status: Limited: Do not attempt resuscitation (DNR) -DNR-LIMITED -Do Not Intubate/DNI   Family Communication:  None present at bedside.   Status is: Inpatient Remains inpatient appropriate because: Medically stable but ALF will not take patient back over the weekend.   Estimated body mass index is 33.13 kg/m as calculated from the following:   Height as of this encounter: 5' 3 (1.6 m).   Weight as of this encounter: 84.8 kg.    Nutritional Assessment: Body mass index is 33.13 kg/m.SABRA Seen by dietician.  I agree with the assessment and plan as  outlined below: Nutrition Status:        . Skin Assessment: I have examined the patient's skin and I agree with the wound assessment as performed by the wound care RN as outlined below:    Consultants:  None  Procedures:   None  Antimicrobials:  Anti-infectives (From admission, onward)    None         Subjective: Seen and examined.  Patient in deep sleep as usual.  Does not want to wake up today.  Appears comfortable.  Objective: Vitals:   12/16/23 1749 12/16/23 1943 12/16/23 2119 12/17/23 0452  BP: (!) 152/54 (!) 115/31 (!) 143/52 (!) 157/59  Pulse: 70 69 (!) 101 70  Resp:  16  16  Temp:  98.2 F (36.8 C) 98.6 F (37 C) 98.8 F (37.1 C)  TempSrc:   Oral   SpO2:  98% 98% 100%  Weight:      Height:        Intake/Output Summary (Last 24 hours) at 12/17/2023 1039 Last data filed at 12/17/2023 0541 Gross per 24 hour  Intake 1772.55 ml  Output 1300 ml  Net 472.55 ml   Filed Weights   12/14/23 0634  Weight: 84.8 kg    Examination:  General exam: Appears calm and comfortable but sleepy Respiratory system: Clear to auscultation. Respiratory effort normal. Cardiovascular system: S1 & S2 heard, RRR. No JVD, murmurs, rubs, gallops or clicks. No pedal edema. Gastrointestinal system: Abdomen is nondistended, soft and nontender. No organomegaly or masses felt. Normal bowel sounds heard.  Data Reviewed: I have personally reviewed following labs and imaging studies  CBC: Recent Labs  Lab 12/13/23 2038 12/14/23 0406 12/15/23 0420 12/17/23 0449  WBC 8.6 9.5 8.4 8.5  NEUTROABS  --   --  5.8 5.7  HGB 8.8* 9.2* 8.7* 8.0*  HCT 29.4* 30.6* 28.8* 26.2*  MCV 94.2 94.7 92.0 92.9  PLT 152 161 155 131*   Basic Metabolic Panel: Recent Labs  Lab 12/13/23 2038 12/14/23 0406 12/14/23 2126 12/15/23 0420 12/16/23 0355 12/17/23 0449  NA 137 138  --  137 135 139  K 6.8* 5.8* 5.3* 4.9 4.5 4.5  CL 108 109  --  105 105 109  CO2 19* 20*  --  21* 22 20*  GLUCOSE 199* 76  --  104* 144* 117*  BUN 69* 69*  --  67* 67* 69*  CREATININE 4.27* 4.07*  --  3.99* 4.02* 4.08*  CALCIUM  8.7* 8.9  --  8.8* 8.3* 8.2*   GFR: Estimated Creatinine Clearance: 9.5 mL/min (A) (by C-G formula based on SCr of 4.08  mg/dL (H)). Liver Function Tests: Recent Labs  Lab 12/13/23 2038  AST 28  ALT 16  ALKPHOS 111  BILITOT 0.7  PROT 6.6  ALBUMIN  3.2*   No results for input(s): LIPASE, AMYLASE in the last 168 hours. No results for input(s): AMMONIA in the last 168 hours. Coagulation Profile: No results for input(s): INR, PROTIME in the last 168 hours. Cardiac Enzymes: No results for input(s): CKTOTAL, CKMB, CKMBINDEX, TROPONINI in the last 168 hours. BNP (last 3 results) No results for input(s): PROBNP in the last 8760 hours. HbA1C: No results for input(s): HGBA1C in the last 72 hours.  CBG: Recent Labs  Lab 12/16/23 1138 12/16/23 1352 12/16/23 1642 12/16/23 2103 12/17/23 0732  GLUCAP 96 165* 167* 194* 104*   Lipid Profile: No results for input(s): CHOL, HDL, LDLCALC, TRIG, CHOLHDL, LDLDIRECT in the last 72 hours. Thyroid  Function Tests: No results for  input(s): TSH, T4TOTAL, FREET4, T3FREE, THYROIDAB in the last 72 hours. Anemia Panel: No results for input(s): VITAMINB12, FOLATE, FERRITIN, TIBC, IRON, RETICCTPCT in the last 72 hours. Sepsis Labs: No results for input(s): PROCALCITON, LATICACIDVEN in the last 168 hours.  Recent Results (from the past 240 hours)  MRSA Next Gen by PCR, Nasal     Status: None   Collection Time: 12/14/23  4:48 AM   Specimen: Nasal Mucosa; Nasal Swab  Result Value Ref Range Status   MRSA by PCR Next Gen NOT DETECTED NOT DETECTED Final    Comment: (NOTE) The GeneXpert MRSA Assay (FDA approved for NASAL specimens only), is one component of a comprehensive MRSA colonization surveillance program. It is not intended to diagnose MRSA infection nor to guide or monitor treatment for MRSA infections. Test performance is not FDA approved in patients less than 23 years old. Performed at [facility], [address] [place], [place] [address]      Radiology Studies: No results  found.   Scheduled Meds:  aspirin   81 mg Oral Daily   atorvastatin   40 mg Oral q morning   carvedilol   12.5 mg Oral BID WC   divalproex   125 mg Oral Q0600   enoxaparin  (LOVENOX ) injection  30 mg Subcutaneous Q24H   escitalopram   10 mg Oral q morning   ezetimibe   10 mg Oral q morning   insulin  aspart  0-5 Units Subcutaneous QHS   insulin  aspart  0-9 Units Subcutaneous TID WC   isosorbide  mononitrate  30 mg Oral q morning   levothyroxine   100 mcg Oral Q0600   lidocaine   1 patch Transdermal Q24H   pantoprazole   40 mg Oral Daily   pneumococcal 20-valent conjugate vaccine  0.5 mL Intramuscular Tomorrow-1000   QUEtiapine   25 mg Oral Q24H   sodium bicarbonate   650 mg Oral TID   traZODone   25 mg Oral QHS   Continuous Infusions:     LOS: 3 days   Fredia Skeeter, MD [facility]  12/17/2023, 10:39 AM   *Please note that this is a verbal dictation therefore any spelling or grammatical errors are due to the Dragon Medical One system interpretation.  Please page via Amion and do not message via secure chat for urgent patient care matters. Secure chat can be used for non urgent patient care matters.  How to contact the TRH Attending or Consulting provider 7A - 7P or covering provider during after hours 7P -7A, for this patient?  Check the care team in [facility] and look for a) attending/consulting TRH provider listed and b) the TRH team listed. Page or secure chat 7A-7P. Log into [email or web address] and use [place]'s universal password to access. If you do not have the password, please contact the hospital operator. Locate the TRH provider you are looking for under [facility] and page to a number that you can be directly reached. If you still have difficulty reaching the provider, please page the [facility] (Director on Call) for the Hospitalists listed on amion for assistance.

## 2023-12-18 DIAGNOSIS — R627 Adult failure to thrive: Secondary | ICD-10-CM

## 2023-12-18 DIAGNOSIS — E875 Hyperkalemia: Secondary | ICD-10-CM | POA: Diagnosis not present

## 2023-12-18 DIAGNOSIS — Z515 Encounter for palliative care: Secondary | ICD-10-CM | POA: Diagnosis not present

## 2023-12-18 DIAGNOSIS — Z7189 Other specified counseling: Secondary | ICD-10-CM | POA: Diagnosis not present

## 2023-12-18 DIAGNOSIS — W19XXXA Unspecified fall, initial encounter: Secondary | ICD-10-CM | POA: Diagnosis not present

## 2023-12-18 LAB — GLUCOSE, CAPILLARY
Glucose-Capillary: 131 mg/dL — ABNORMAL HIGH (ref 70–99)
Glucose-Capillary: 119 mg/dL — ABNORMAL HIGH (ref 70–99)
Glucose-Capillary: 197 mg/dL — ABNORMAL HIGH (ref 70–99)
Glucose-Capillary: 142 mg/dL — ABNORMAL HIGH (ref 70–99)

## 2023-12-18 NOTE — Progress Notes (Signed)
 PROGRESS NOTE    Molly Cobb  FMW:[id number] DOB: 17-May-1932 DOA: 12/13/2023 PCP: Fredricka Richerd Ee, MD   Brief Narrative:  HPI: Molly Cobb is a 89 y.o. female with medical history significant of CKD, CAD, type 2 diabetes, hypertension, aortic stenosis status post TAVR who presented to the emergency department after a fall.  Patient was having multiple falls over the last several days due to losing her balance.  She presented to the ER where she was found to be hypertensive with systolics in the 170s.  Labs were obtained which showed potassium 6.8, creatinine 4.2, WBC 8.6, hemoglobin 8.8, urinalysis negative for infection.  Patient underwent CT spine which showed no evidence of fracture.  CT head showed no acute findings.  X-ray of shoulder showed no acute fractures.   Assessment & Plan:   Principal Problem:   Hyperkalemia Active Problems:   Hypothyroidism (acquired)   Anemia in chronic renal disease  Generalized weakness/mechanical fall: CT and x-rays with no evidence of fracture.  She uses walker to get around.  She typically ambulates independently.  Seen by PT OT, SNF recommended, TOC consulted.  Per TOC, daughter has made a decision to take patient back to assisted living facility however ALF will not accept patient back over the weekend.  On 12/17/2023 I tried calling patient's daughter to discuss disposition and my concerns about that and left a voicemail.  I called her back again in the afternoon and finally she agreed for the patient to go to SNF.  I sent a message to the [facility] at 3:57 PM on 12/17/2023 about this new change and requested to start the process.  Waiting for TOC to work on placement.  Hyperkalemia: Presented with 6.8.  Received dextrose , Lasix , insulin  and Lokelma .  Down to 5.8.  Continued on Lokelma , potassium normalized now.   AKI on CKD stage V: Patient's baseline creatinine appears to be between 3.4-3.8.  Presented with a 4.27.  Creatinine has remained stable around 4  for last 4 days.  This is her new baseline.  Type 2 diabetes mellitus: Hemoglobin A1c 7.5, 5 months ago.  PTA glipizide  and Tradjenta .  Hold both of them.  Continue SSI.  Essential hypertension: Blood pressure controlled.  Continue Coreg  and Imdur .  Acquired hypothyroidism: Continue Synthroid .  History of severe aortic stenosis status post TAVR: Continue aspirin .  Hyperlipidemia: Continue atorvastatin .  Anemia of chronic disease/due to CKD: Stable.  Delirium: No more events of agitation.  Continue nightly Seroquel . 1. Avoid benzodiazepines, antihistamines, anticholinergics, and minimize opiate use as these may worsen delirium. 2: Assess, prevent and manage pain as lack of treatment can result in delirium.  3: Provide appropriate lighting and clear signage; a clock and calendar should be easily visible to the patient. 4: Monitor environmental factors. Reduce light and noise at night (close shades, turn off lights, turn off TV, ect). Correct any alterations in sleep cycle. 5: Reorient the patient to person, place, time and situation on each encounter.  6: Correct sensory deficits if possible (replace eye glasses, hearing aids, ect). 7: Avoid restraints if able. Severely delirious patients benefit from constant observation by a sitter.  DVT prophylaxis: enoxaparin  (LOVENOX ) injection 30 mg Start: 12/14/23 1000 SCDs Start: 12/14/23 0238   Code Status: Limited: Do not attempt resuscitation (DNR) -DNR-LIMITED -Do Not Intubate/DNI   Family Communication:  None present at bedside.  Discussed with daughter on 12/17/2023 as mentioned in the note.  Status is: Inpatient Remains inpatient appropriate because: Medically stable but ALF will not  take patient back over the weekend.   Estimated body mass index is 33.13 kg/m as calculated from the following:   Height as of this encounter: 5' 3 (1.6 m).   Weight as of this encounter: 84.8 kg.    Nutritional Assessment: Body mass index is 33.13  kg/m.SABRA Seen by dietician.  I agree with the assessment and plan as outlined below: Nutrition Status:        . Skin Assessment: I have examined the patient's skin and I agree with the wound assessment as performed by the wound care RN as outlined below:    Consultants:  None  Procedures:  None  Antimicrobials:  Anti-infectives (From admission, onward)    None         Subjective: Patient seen and examined.  She is awake today.  She has no complaints.  She is fully oriented.  Objective: Vitals:   12/17/23 1208 12/17/23 2100 12/18/23 0354 12/18/23 1150  BP: (!) 157/53 (!) 142/60 (!) 142/52 (!) 142/44  Pulse: 70 70 75 69  Resp: 16  18   Temp: 98.8 F (37.1 C) 98.4 F (36.9 C) 98.2 F (36.8 C) 98.1 F (36.7 C)  TempSrc: Oral Oral Oral Oral  SpO2: 100% 100% 100% 100%  Weight:      Height:        Intake/Output Summary (Last 24 hours) at 12/18/2023 1204 Last data filed at 12/18/2023 0928 Gross per 24 hour  Intake 360 ml  Output 1350 ml  Net -990 ml   Filed Weights   12/14/23 0634  Weight: 84.8 kg    Examination:  General exam: Appears calm and comfortable  Respiratory system: Clear to auscultation. Respiratory effort normal. Cardiovascular system: S1 & S2 heard, RRR. No JVD, murmurs, rubs, gallops or clicks. No pedal edema. Gastrointestinal system: Abdomen is nondistended, soft and nontender. No organomegaly or masses felt. Normal bowel sounds heard. Central nervous system: Alert and oriented. No focal neurological deficits. Extremities: Symmetric 5 x 5 power. Skin: No rashes, lesions or ulcers.    Data Reviewed: I have personally reviewed following labs and imaging studies  CBC: Recent Labs  Lab 12/13/23 2038 12/14/23 0406 12/15/23 0420 12/17/23 0449  WBC 8.6 9.5 8.4 8.5  NEUTROABS  --   --  5.8 5.7  HGB 8.8* 9.2* 8.7* 8.0*  HCT 29.4* 30.6* 28.8* 26.2*  MCV 94.2 94.7 92.0 92.9  PLT 152 161 155 131*   Basic Metabolic Panel: Recent Labs   Lab 12/13/23 2038 12/14/23 0406 12/14/23 2126 12/15/23 0420 12/16/23 0355 12/17/23 0449  NA 137 138  --  137 135 139  K 6.8* 5.8* 5.3* 4.9 4.5 4.5  CL 108 109  --  105 105 109  CO2 19* 20*  --  21* 22 20*  GLUCOSE 199* 76  --  104* 144* 117*  BUN 69* 69*  --  67* 67* 69*  CREATININE 4.27* 4.07*  --  3.99* 4.02* 4.08*  CALCIUM  8.7* 8.9  --  8.8* 8.3* 8.2*   GFR: Estimated Creatinine Clearance: 9.5 mL/min (A) (by C-G formula based on SCr of 4.08 mg/dL (H)). Liver Function Tests: Recent Labs  Lab 12/13/23 2038  AST 28  ALT 16  ALKPHOS 111  BILITOT 0.7  PROT 6.6  ALBUMIN  3.2*   No results for input(s): LIPASE, AMYLASE in the last 168 hours. No results for input(s): AMMONIA in the last 168 hours. Coagulation Profile: No results for input(s): INR, PROTIME in the last 168 hours. Cardiac Enzymes:  No results for input(s): CKTOTAL, CKMB, CKMBINDEX, TROPONINI in the last 168 hours. BNP (last 3 results) No results for input(s): PROBNP in the last 8760 hours. HbA1C: No results for input(s): HGBA1C in the last 72 hours.  CBG: Recent Labs  Lab 12/17/23 1203 12/17/23 1648 12/17/23 2147 12/18/23 0743 12/18/23 1127  GLUCAP 96 104* 217* 131* 119*   Lipid Profile: No results for input(s): CHOL, HDL, LDLCALC, TRIG, CHOLHDL, LDLDIRECT in the last 72 hours. Thyroid  Function Tests: No results for input(s): TSH, T4TOTAL, FREET4, T3FREE, THYROIDAB in the last 72 hours. Anemia Panel: No results for input(s): VITAMINB12, FOLATE, FERRITIN, TIBC, IRON, RETICCTPCT in the last 72 hours. Sepsis Labs: No results for input(s): PROCALCITON, LATICACIDVEN in the last 168 hours.  Recent Results (from the past 240 hours)  MRSA Next Gen by PCR, Nasal     Status: None   Collection Time: 12/14/23  4:48 AM   Specimen: Nasal Mucosa; Nasal Swab  Result Value Ref Range Status   MRSA by PCR Next Gen NOT DETECTED NOT DETECTED Final     Comment: (NOTE) The GeneXpert MRSA Assay (FDA approved for NASAL specimens only), is one component of a comprehensive MRSA colonization surveillance program. It is not intended to diagnose MRSA infection nor to guide or monitor treatment for MRSA infections. Test performance is not FDA approved in patients less than 33 years old. Performed at [facility], [address], [place], [place] [address]      Radiology Studies: No results found.   Scheduled Meds:  aspirin   81 mg Oral Daily   atorvastatin   40 mg Oral q morning   carvedilol   12.5 mg Oral BID WC   divalproex   125 mg Oral Q0600   enoxaparin  (LOVENOX ) injection  30 mg Subcutaneous Q24H   escitalopram   10 mg Oral q morning   ezetimibe   10 mg Oral q morning   insulin  aspart  0-5 Units Subcutaneous QHS   insulin  aspart  0-9 Units Subcutaneous TID WC   isosorbide  mononitrate  30 mg Oral q morning   levothyroxine   100 mcg Oral Q0600   lidocaine   1 patch Transdermal Q24H   pantoprazole   40 mg Oral Daily   pneumococcal 20-valent conjugate vaccine  0.5 mL Intramuscular Tomorrow-1000   QUEtiapine   25 mg Oral Q24H   sodium bicarbonate   650 mg Oral TID   traZODone   25 mg Oral QHS   Continuous Infusions:     LOS: 4 days   Fredia Skeeter, MD [facility]  12/18/2023, 12:04 PM   *Please note that this is a verbal dictation therefore any spelling or grammatical errors are due to the Dragon Medical One system interpretation.  Please page via Amion and do not message via secure chat for urgent patient care matters. Secure chat can be used for non urgent patient care matters.  How to contact the TRH Attending or Consulting provider 7A - 7P or covering provider during after hours 7P -7A, for this patient?  Check the care team in [facility] and look for a) attending/consulting TRH provider listed and b) the TRH team listed. Page or secure chat 7A-7P. Log into [email or web address] and use [place]'s universal password  to access. If you do not have the password, please contact the hospital operator. Locate the TRH provider you are looking for under [facility] and page to a number that you can be directly reached. If you still have difficulty reaching the provider, please page the [facility] (Director on Call)  for the Hospitalists listed on amion for assistance.

## 2023-12-18 NOTE — Progress Notes (Signed)
 Mobility Specialist - Progress Note   12/18/23 1450  Mobility  Activity Transferred from bed to chair  Level of Assistance Moderate assist, patient does 50-74%  Assistive Device Front wheel walker  Distance Ambulated (ft) 2 ft  Range of Motion/Exercises Active Assistive  Activity Response Tolerated well  Mobility Referral Yes  Mobility visit 1 Mobility  Mobility Specialist Start Time (ACUTE ONLY) 1440  Mobility Specialist Stop Time (ACUTE ONLY) 1450  Mobility Specialist Time Calculation (min) (ACUTE ONLY) 10 min   Pt was found in bed and agreeable to transfer to recliner chair. Was left on recliner chair with all needs met. Call bell in reach and chair alarm on. RN notified.  Erminio Leos,  Mobility Specialist Can be reached via Secure Chat

## 2023-12-18 NOTE — TOC Progression Note (Signed)
 Transition of Care [facility]) - Progression Note    Patient Details  Name: Molly Cobb MRN: [id number] Date of Birth: 09/28/1931  Transition of Care [facility]) CM/SW Contact  Jon ONEIDA Anon, RN Phone Number: 12/18/2023, 12:34 PM  Clinical Narrative:    MD requesting SNF workup be started for pt. NCM spoke with pt daughter [name] at [phone] about sending out referrals for SNF placement and she is in agreement. FL2 completed and SNF referrals sent out, awaiting bed offers. Pt daughter made aware of when bed offers are available, a list will be presented to her. TOC following.   Expected Discharge Plan: Assisted Living Barriers to Discharge: Continued Medical Work up  Expected Discharge Plan and Services       Living arrangements for the past 2 months: Assisted Living Facility                                       Social Determinants of Health (SDOH) Interventions SDOH Screenings   Food Insecurity: No Food Insecurity (12/14/2023)  Housing: Low Risk  (12/14/2023)  Transportation Needs: No Transportation Needs (12/14/2023)  Utilities: Not At Risk (12/14/2023)  Financial Resource Strain: Low Risk  (01/11/2022)   [facility] from [facility] visits prior to 08/28/2022., [facility]  Physical Activity: Unknown (01/11/2022)   [facility] visits prior to 08/28/2022.  Social Connections: Socially Isolated (12/14/2023)  Stress: Stress Concern Present (01/11/2022)   Received from [facility] visits prior to 08/28/2022., [facility]  Tobacco Use: Low Risk  (12/14/2023)    Readmission Risk Interventions    07/05/2023    2:24 PM 09/24/2021   11:03 AM  Readmission Risk Prevention Plan  Transportation Screening Complete Complete  PCP or Specialist Appt within 3-5 Days Complete Complete  HRI or Home Care Consult Complete Complete  Social Work Consult for Recovery Care Planning/Counseling  Complete Complete  Palliative Care Screening Not Applicable Not Applicable  Medication Review [employer]) Complete Complete

## 2023-12-18 NOTE — Plan of Care (Signed)
   Problem: Education: Goal: Knowledge of General Education information will improve Description: Including pain rating scale, medication(s)/side effects and non-pharmacologic comfort measures Outcome: Progressing   Problem: Safety: Goal: Ability to remain free from injury will improve Outcome: Progressing

## 2023-12-18 NOTE — [place] FL2 (Signed)
 [place]  MEDICAID FL2 LEVEL OF CARE FORM     IDENTIFICATION  Patient Name: Molly Cobb Birthdate: 02/14/1932 Sex: female Admission Date (Current Location): 12/13/2023  [facility] and [place] Number:  Producer, television/film/video and Address:  [facility],  [address] [place], [place] [address]      Provider Number: [id number]  Attending Physician Name and Address:  Vernon Ranks, MD  Relative Name and Phone Number:  [name] (Daughter)  [phone]    Current Level of Care: Hospital Recommended Level of Care: Skilled Nursing Facility Prior Approval Number:    Date Approved/Denied:   PASRR Number: [id number]  Discharge Plan: SNF    Current Diagnoses: Patient Active Problem List   Diagnosis Date Noted   Patella fracture 07/02/2023   UTI (urinary tract infection) 07/02/2023   Acute metabolic encephalopathy 07/02/2023   Metabolic acidosis 07/02/2023   Hyperkalemia 07/02/2023   Adult failure to thrive 09/28/2021   Frequency of micturition 09/22/2021   Hyponatremia 09/21/2021   Severe aortic stenosis 04/12/2017   S/P TAVR (transcatheter aortic valve replacement) 04/12/2017   Chronic kidney disease (CKD), stage V (HCC) 03/23/2017   AVM (arteriovenous malformation) of stomach, acquired with hemorrhage    S/P placement of cardiac pacemaker 02/16/2017   Anemia in chronic renal disease 02/13/2017   Hypothyroidism (acquired) 06/29/2016   Type 2 diabetes mellitus with stage 4 chronic kidney disease, without long-term current use of insulin  (HCC) 09/09/2015   Benign hypertension with CKD (chronic kidney disease) stage IV (HCC) 08/02/2015    Orientation RESPIRATION BLADDER Height & Weight     Self, Place  Normal Incontinent Weight: 84.8 kg Height:  5' 3 (160 cm)  BEHAVIORAL SYMPTOMS/MOOD NEUROLOGICAL BOWEL NUTRITION STATUS      Incontinent Diet (Regular)  AMBULATORY STATUS COMMUNICATION OF NEEDS Skin   Limited Assist Verbally Normal                        Personal Care Assistance Level of Assistance  Bathing, Feeding, Dressing Bathing Assistance: Limited assistance Feeding assistance: Limited assistance Dressing Assistance: Limited assistance     Functional Limitations Info  Sight, Hearing, Speech Sight Info: Impaired Hearing Info: Impaired Speech Info: Adequate    SPECIAL CARE FACTORS FREQUENCY  PT (By licensed PT), OT (By licensed OT)     PT Frequency: 5xwk OT Frequency: 5xwk            Contractures Contractures Info: Not present    Additional Factors Info  Code Status, Allergies, Psychotropic Code Status Info: DNR- Limited Allergies Info: Oxybutynin, Oysters (shellfish Allergy), Shellfish-derived Products, Sulfa Antibiotics, Sulfacetamide Sodium Psychotropic Info: traZODone  (DESYREL ), QUEtiapine  (SEROQUEL )         Current Medications (12/18/2023):  This is the current hospital active medication list Current Facility-Administered Medications  Medication Dose Route Frequency Provider Last Rate Last Admin   acetaminophen  (TYLENOL ) tablet 650 mg  650 mg Oral Q6H PRN Dena Charleston, MD   650 mg at 12/14/23 2340   Or   acetaminophen  (TYLENOL ) suppository 650 mg  650 mg Rectal Q6H PRN Dena Charleston, MD       aspirin  chewable tablet 81 mg  81 mg Oral Daily Dorrell, Robert, MD   81 mg at 12/17/23 1239   atorvastatin  (LIPITOR) tablet 40 mg  40 mg Oral q morning Dena Charleston, MD   40 mg at 12/17/23 1240   carvedilol  (COREG ) tablet 12.5 mg  12.5 mg Oral BID WC Dena Charleston, MD   12.5 mg  at 12/17/23 1706   divalproex  (DEPAKOTE ) DR tablet 125 mg  125 mg Oral Q0600 Dena Charleston, MD   125 mg at 12/18/23 9358   enoxaparin  (LOVENOX ) injection 30 mg  30 mg Subcutaneous Q24H Dena Charleston, MD   30 mg at 12/17/23 1129   escitalopram  (LEXAPRO ) tablet 10 mg  10 mg Oral q morning Dena Charleston, MD   10 mg at 12/17/23 1240   ezetimibe  (ZETIA ) tablet 10 mg  10 mg Oral q morning Dena Charleston, MD   10 mg at 12/17/23 1239    haloperidol  lactate (HALDOL ) injection 5 mg  5 mg Intravenous Q6H PRN Pahwani, Ravi, MD   5 mg at 12/15/23 1919   insulin  aspart (novoLOG ) injection 0-5 Units  0-5 Units Subcutaneous QHS Pahwani, Ravi, MD   2 Units at 12/17/23 2151   insulin  aspart (novoLOG ) injection 0-9 Units  0-9 Units Subcutaneous TID WC Pahwani, Ravi, MD   2 Units at 12/16/23 1739   isosorbide  mononitrate (IMDUR ) 24 hr tablet 30 mg  30 mg Oral q morning Dena Charleston, MD   30 mg at 12/17/23 1241   levothyroxine  (SYNTHROID ) tablet 100 mcg  100 mcg Oral Q0600 Dena Charleston, MD   100 mcg at 12/18/23 0640   lidocaine  (LIDODERM ) 5 % 1 patch  1 patch Transdermal Q24H Dena Charleston, MD   1 patch at 12/17/23 2125   ondansetron  (ZOFRAN ) tablet 4 mg  4 mg Oral Q6H PRN Dena Charleston, MD       Or   ondansetron  (ZOFRAN ) injection 4 mg  4 mg Intravenous Q6H PRN Dorrell, Robert, MD       pantoprazole  (PROTONIX ) EC tablet 40 mg  40 mg Oral Daily Dorrell, Robert, MD   40 mg at 12/17/23 1241   pneumococcal 20-valent conjugate vaccine (PREVNAR 20 ) injection 0.5 mL  0.5 mL Intramuscular Tomorrow-1000 Dorrell, Charleston, MD       QUEtiapine  (SEROQUEL ) tablet 25 mg  25 mg Oral Q24H Pahwani, Ravi, MD   25 mg at 12/17/23 2126   sodium bicarbonate  tablet 650 mg  650 mg Oral TID Dena Charleston, MD   650 mg at 12/17/23 2126   traZODone  (DESYREL ) tablet 25 mg  25 mg Oral QHS Dorrell, Robert, MD   25 mg at 12/17/23 2126     Discharge Medications: Please see discharge summary for a list of discharge medications.  Relevant Imaging Results:  Relevant Lab Results:   Additional Information [id number]  Jon ONEIDA Anon, RN

## 2023-12-19 DIAGNOSIS — E875 Hyperkalemia: Secondary | ICD-10-CM | POA: Diagnosis not present

## 2023-12-19 LAB — BASIC METABOLIC PANEL WITH GFR
Anion gap: 11 (ref 5–15)
BUN: 69 mg/dL — ABNORMAL HIGH (ref 8–23)
CO2: 20 mmol/L — ABNORMAL LOW (ref 22–32)
Calcium: 8.6 mg/dL — ABNORMAL LOW (ref 8.9–10.3)
Chloride: 106 mmol/L (ref 98–111)
Creatinine, Ser: 3.67 mg/dL — ABNORMAL HIGH (ref 0.44–1.00)
GFR, Estimated: 11 mL/min — ABNORMAL LOW (ref >60)
Glucose, Bld: 89 mg/dL (ref 70–99)
Potassium: 4.4 mmol/L (ref 3.5–5.1)
Sodium: 137 mmol/L (ref 135–145)

## 2023-12-19 LAB — CBC WITH DIFFERENTIAL/PLATELET
Abs Immature Granulocytes: 0.04 10*3/uL (ref 0.00–0.07)
Basophils Absolute: 0 10*3/uL (ref 0.0–0.1)
Basophils Relative: 0 %
Eosinophils Absolute: 0.2 10*3/uL (ref 0.0–0.5)
Eosinophils Relative: 2 %
HCT: 27.7 % — ABNORMAL LOW (ref 36.0–46.0)
Hemoglobin: 8.5 g/dL — ABNORMAL LOW (ref 12.0–15.0)
Immature Granulocytes: 0 %
Lymphocytes Relative: 12 %
Lymphs Abs: 1.3 10*3/uL (ref 0.7–4.0)
MCH: 28.2 pg (ref 26.0–34.0)
MCHC: 30.7 g/dL (ref 30.0–36.0)
MCV: 92 fL (ref 80.0–100.0)
Monocytes Absolute: 1.1 10*3/uL — ABNORMAL HIGH (ref 0.1–1.0)
Monocytes Relative: 10 %
Neutro Abs: 8.1 10*3/uL — ABNORMAL HIGH (ref 1.7–7.7)
Neutrophils Relative %: 76 %
Platelets: 142 10*3/uL — ABNORMAL LOW (ref 150–400)
RBC: 3.01 MIL/uL — ABNORMAL LOW (ref 3.87–5.11)
RDW: 16 % — ABNORMAL HIGH (ref 11.5–15.5)
WBC: 10.8 10*3/uL — ABNORMAL HIGH (ref 4.0–10.5)
nRBC: 0 % (ref 0.0–0.2)

## 2023-12-19 LAB — GLUCOSE, CAPILLARY
Glucose-Capillary: 93 mg/dL (ref 70–99)
Glucose-Capillary: 99 mg/dL (ref 70–99)
Glucose-Capillary: 153 mg/dL — ABNORMAL HIGH (ref 70–99)
Glucose-Capillary: 150 mg/dL — ABNORMAL HIGH (ref 70–99)

## 2023-12-19 NOTE — Progress Notes (Signed)
 Physical Therapy Treatment Patient Details Name: Molly Cobb MRN: [id number] DOB: Nov 09, 1931 Today's Date: 12/19/2023   History of Present Illness Patient is a 89 year old female whom presents to therapy following hospital admission on 12/14/2023 due to multiple falls. Pt ws found to be hypertensive with systolics in 170s. Imaging negative for acute findings and abn lab findings including hyperkalemia in the setting of kidney dz.  Pt recently presented to hospital on 07/01/2023 with pain in R knee and face after fall. Patient was found to have mild patellar fracture, UTI, and acute metabolic encephalopathy. Pt PMH includes but is not limited to: CKD, CAD, depression, type II DM, gout, hypertension, hypothyroidism, severe aortic stenosis with history of TAVR, history of pacemaker placement, hypothyroidism.    PT Comments  The patient is alert, able to follow simple directions, speaking but not sensible at times. Daughter is presrnt. Patient required min assistance for  stand, step pivot to recliner with +2  for safety,. Patient is much more alert. Patient will benefit from continued inpatient follow up therapy, <3 hours/day   If plan is discharge home, recommend the following: A little help with walking and/or transfers;A lot of help with bathing/dressing/bathroom;Assistance with cooking/housework;Assist for transportation;Help with stairs or ramp for entrance   Can travel by private vehicle     No  Equipment Recommendations  None recommended by PT    Recommendations for Other Services       Precautions / Restrictions Precautions Precautions: Fall;ICD/Pacemaker Restrictions Weight Bearing Restrictions Per Provider Order: No     Mobility  Bed Mobility Overal bed mobility: Needs Assistance Bed Mobility: Supine to Sit     Supine to sit: Min assist, HOB elevated, Used rails     General bed mobility comments: extra time    Transfers Overall transfer level: Needs assistance Equipment  used: Rolling walker (2 wheels) Transfers: Bed to chair/wheelchair/BSC, Sit to/from Stand Sit to Stand: Min assist, +2 safety/equipment Stand pivot transfers: Min assist, +2 safety/equipment Step pivot transfers: Min assist, +2 safety/equipment       General transfer comment: min assist to stand, able to step to recliner using RW    Ambulation/Gait                   Stairs             Wheelchair Mobility     Tilt Bed    Modified Rankin (Stroke Patients Only)       Balance Overall balance assessment: Needs assistance, History of Falls Sitting-balance support: Feet supported Sitting balance-Leahy Scale: Good     Standing balance support: Bilateral upper extremity supported, During functional activity, Reliant on assistive device for balance Standing balance-Leahy Scale: Poor                              Communication Communication Communication: Impaired Factors Affecting Communication: Difficulty expressing self  Cognition Arousal: Alert Behavior During Therapy: Flat affect   PT - Cognitive impairments: History of cognitive impairments, No family/caregiver present to determine baseline, Orientation, Awareness, Memory, Attention, Problem solving, Safety/Judgement   Orientation impairments: Place, Time, Situation                   PT - Cognition Comments: frequently stating that the bed is talking then asked if the chair will talk,nonsensible  mostly. daughter present. Pt. did state that she was not hungry Following commands: Impaired Following commands impaired: Follows one step  commands inconsistently    Cueing Cueing Techniques: Verbal cues, Gestural cues, Tactile cues, Visual cues  Exercises      General Comments        Pertinent Vitals/Pain Pain Assessment Pain Assessment: No/denies pain    Home Living                          Prior Function            PT Goals (current goals can now be found in the  care plan section) Progress towards PT goals: Progressing toward goals    Frequency    Min 2X/week      PT Plan      Co-evaluation              AM-PAC PT 6 Clicks Mobility   Outcome Measure  Help needed turning from your back to your side while in a flat bed without using bedrails?: A Little Help needed moving from lying on your back to sitting on the side of a flat bed without using bedrails?: A Little Help needed moving to and from a bed to a chair (including a wheelchair)?: A Lot Help needed standing up from a chair using your arms (e.g., wheelchair or bedside chair)?: A Lot Help needed to walk in hospital room?: Total Help needed climbing 3-5 steps with a railing? : Total 6 Click Score: 12    End of Session Equipment Utilized During Treatment: Gait belt Activity Tolerance: Patient tolerated treatment well Patient left: in chair;with call bell/phone within reach;with chair alarm set;with family/visitor present Nurse Communication: Mobility status PT Visit Diagnosis: Unsteadiness on feet (R26.81);Other abnormalities of gait and mobility (R26.89);Repeated falls (R29.6);Muscle weakness (generalized) (M62.81);Difficulty in walking, not elsewhere classified (R26.2)     Time: 8858-8844 PT Time Calculation (min) (ACUTE ONLY): 14 min  Charges:    $Therapeutic Activity: 8-22 mins PT General Charges $$ ACUTE PT VISIT: 1 Visit                     [name] PT Acute Rehabilitation Services Office [phone]    [name] 12/19/2023, 2:12 PM

## 2023-12-19 NOTE — TOC Progression Note (Addendum)
 Transition of Care [facility]) - Progression Note    Patient Details  Name: Molly Cobb MRN: [id number] Date of Birth: 08-15-1931  [facility] CM/SW Contact  Heather DELENA Saltness, LCSW Phone Number: 12/19/2023, 10:09 AM  Clinical Narrative:     ADDENDUM: 2:35 PM - CSW requested insurance authorization, approval pending.  CSW spoke with pt's daughter, [name] [phone], via phone call to discuss SNF bed offers and give bed choice. CSW provided daughter with list of facilities with facility name, location, and Medicare Star-Rating. [name] chooses bed at [facility]. CSW will request insurance authorization. TOC will continue to follow.  Medicare Star-Rating  [facility] [address] [place], [place] [address] [phone] Overall rating ???? Much above average  [facility] [address] [place], [place] [address] [phone] Overall rating ??  [facility] [address] [place], [place] [address] [phone] Overall rating ?  [facility] [address] [place], [place] [address] [phone] Overall rating ? Much below average  [facility] [address] [place], [place] [address] [phone] Overall rating ???? Above average  [facility] [address] [place], [place] [address] [phone] Overall rating ?? Much below average  Expected Discharge Plan: Assisted Living Barriers to Discharge: Continued Medical Work up  Expected Discharge Plan and Services  SNF for short-term rehab     Living arrangements for the past 2 months: Assisted Living Facility                  Social Determinants of Health (SDOH) Interventions SDOH Screenings   Food Insecurity: No Food Insecurity (12/14/2023)  Housing: Low Risk  (12/14/2023)  Transportation Needs: No  Transportation Needs (12/14/2023)  Utilities: Not At Risk (12/14/2023)  Financial Resource Strain: Low Risk  (01/11/2022)   [facility] visits prior to 08/28/2022., [facility]  Physical Activity: Unknown (01/11/2022)   [facility] prior to 08/28/2022.  Social Connections: Socially Isolated (12/14/2023)  Stress: Stress Concern Present (01/11/2022)   [facility] visits prior to 08/28/2022., [facility]  Tobacco Use: Low Risk  (12/14/2023)    Readmission Risk Interventions    07/05/2023    2:24 PM 09/24/2021   11:03 AM  Readmission Risk Prevention Plan  Transportation Screening Complete Complete  PCP or Specialist Appt within 3-5 Days Complete Complete  HRI or Home Care Consult Complete Complete  Social Work Consult for Recovery Care Planning/Counseling Complete Complete  Palliative Care Screening Not Applicable Not Applicable  Medication Review (RN Care Manager) Complete Complete    Heather Saltness, MSW, LCSW 12/19/2023 10:53 AM

## 2023-12-19 NOTE — Progress Notes (Signed)
 Palliative Medicine Progress Note   Patient Name: Molly Cobb       Date: 12/19/2023 DOB: 14-Apr-1932  Age: 89 y.o. MRN#: [id number] Attending Physician: Vernon Ranks, MD Primary Care Physician: Fredricka Richerd Ee, MD Admit Date: 12/13/2023   HPI/Patient Profile: 89 y.o. female  with past medical history of CAD, vascular dementia, CKD stage V (baseline creatinine 3.0-3.6), type 2 diabetes, aortic stenosis status post TAVR, hypertension, and hypothyroidism who presented to the ED on 12/13/2023 after a fall.  Patient reportedly having multiple falls over the last several days.  In the ED, labs significant for potassium 6.8 and creatinine 4.2.  Imaging showed no evidence of fracture.  CT head showed no acute findings. Patient is admitted with hyperkalemia and AKI on CKD.    Palliative Medicine has been consulted for goals of care discussions. Patient and family are faced with anticipatory care needs and complex medical decision making.   Subjective: Chart reviewed. Update received from RN. Patient assessed at bedside. She is out of bed to the recliner. She is oriented to self and hospital. States she feels ok and has no acute complaints. Appears weak and frail.   BMP reviewed, noting creatinine of 4.08 on 6/21 (no labs done today).  I later spoke with daughter/Molly Cobb be phone. Provided updates on patient's condition as per above. Discussed recommendation for patient be evaluated for SNF/rehab instead of returning to ALF - [name] is agreeable to this. She expresses appreciation for out conversation on 6/20 and for ongoing palliative support.    Objective:  Physical Exam Vitals reviewed.  Constitutional:      General: She is awake. She is not in acute distress.    Comments: Frail and  chronically ill-appearing  Pulmonary:     Effort: Pulmonary effort is normal.   Neurological:     Comments: Oriented to self and hospital  Psychiatric:        Cognition and Memory: Cognition is impaired. Memory is impaired.             Palliative Medicine Assessment & Plan   Assessment: Principal Problem:   Hyperkalemia Active Problems:   Hypothyroidism (acquired)   Anemia in chronic renal disease    Recommendations/Plan: Goal of care is continue supportive interventions to treat the treatable  Daughter initially hoped patient could return to ALF when  medically stable, but is agreeable to rehab if needed Daughter does not wish to pursue dialysis if patient's kidney function worsens MOST form completed 6/20 - original placed on shadow chart and copy made to scanned into EMR Outpatient palliative at discharge PMT will continue to follow   Code Status: DNR - limited  Primary Decision Maker: HCPOA - daughter/Molly Cobb   Prognosis:  Unable to determine  Discharge Planning: Skilled Nursing Facility for rehab with Palliative care service follow-up   Thank you for allowing the Palliative Medicine Team to assist in the care of this patient.   Time: 35 minutes   Recardo KATHEE Loll, NP   Please contact Palliative Medicine Team phone at [phone] for questions and concerns.  For individual providers, please see AMION.

## 2023-12-19 NOTE — Progress Notes (Signed)
 PROGRESS NOTE    Molly Cobb  FMW:[id number] DOB: 10/19/1931 DOA: 12/13/2023 PCP: Fredricka Richerd Ee, MD   Brief Narrative:  HPI: Molly Cobb is a 89 y.o. female with medical history significant of CKD, CAD, type 2 diabetes, hypertension, aortic stenosis status post TAVR who presented to the emergency department after a fall.  Patient was having multiple falls over the last several days due to losing her balance.  She presented to the ER where she was found to be hypertensive with systolics in the 170s.  Labs were obtained which showed potassium 6.8, creatinine 4.2, WBC 8.6, hemoglobin 8.8, urinalysis negative for infection.  Patient underwent CT spine which showed no evidence of fracture.  CT head showed no acute findings.  X-ray of shoulder showed no acute fractures.   Assessment & Plan:   Principal Problem:   Hyperkalemia Active Problems:   Hypothyroidism (acquired)   Anemia in chronic renal disease  Generalized weakness/mechanical fall: CT and x-rays with no evidence of fracture.  She uses walker to get around.  She typically ambulates independently.  Seen by PT OT, SNF recommended, TOC consulted.  Per TOC, daughter has made a decision to take patient back to assisted living facility however ALF will not accept patient back over the weekend.  On 12/17/2023 I tried calling patient's daughter to discuss disposition and my concerns about that and left a voicemail.  I called her back again in the afternoon and finally she agreed for the patient to go to SNF.  TOC is informed and is working on placement.  Hyperkalemia: Presented with 6.8.  Received dextrose , Lasix , insulin  and Lokelma .  Down to 5.8.  Continued on Lokelma , potassium normalized now.  Rechecking BMP today.  AKI on CKD stage V: Patient's baseline creatinine appears to be between 3.4-3.8.  Presented with a 4.27.  Creatinine has remained stable around 4 for last 4 days.  This is her new baseline.  Rechecking BMP today.  Type 2  diabetes mellitus: Hemoglobin A1c 7.5, 5 months ago.  PTA glipizide  and Tradjenta .  Hold both of them.  Continue SSI.  Essential hypertension: Blood pressure controlled.  Continue Coreg  and Imdur .  Acquired hypothyroidism: Continue Synthroid .  History of severe aortic stenosis status post TAVR: Continue aspirin .  Hyperlipidemia: Continue atorvastatin .  Anemia of chronic disease/due to CKD: Stable.  Delirium: No more events of agitation.  Continue nightly Seroquel . 1. Avoid benzodiazepines, antihistamines, anticholinergics, and minimize opiate use as these may worsen delirium. 2: Assess, prevent and manage pain as lack of treatment can result in delirium.  3: Provide appropriate lighting and clear signage; a clock and calendar should be easily visible to the patient. 4: Monitor environmental factors. Reduce light and noise at night (close shades, turn off lights, turn off TV, ect). Correct any alterations in sleep cycle. 5: Reorient the patient to person, place, time and situation on each encounter.  6: Correct sensory deficits if possible (replace eye glasses, hearing aids, ect). 7: Avoid restraints if able. Severely delirious patients benefit from constant observation by a sitter.  DVT prophylaxis: enoxaparin  (LOVENOX ) injection 30 mg Start: 12/14/23 1000 SCDs Start: 12/14/23 0238   Code Status: Limited: Do not attempt resuscitation (DNR) -DNR-LIMITED -Do Not Intubate/DNI   Family Communication:  None present at bedside.  Discussed with daughter on 12/17/2023 as mentioned in the note.  Status is: Inpatient Remains inpatient appropriate because: Medically stable but ALF will not take patient back over the weekend.   Estimated body mass index is 33.13 kg/m  as calculated from the following:   Height as of this encounter: 5' 3 (1.6 m).   Weight as of this encounter: 84.8 kg.    Nutritional Assessment: Body mass index is 33.13 kg/m.SABRA Seen by dietician.  I agree with the assessment  and plan as outlined below: Nutrition Status:        . Skin Assessment: I have examined the patient's skin and I agree with the wound assessment as performed by the wound care RN as outlined below:    Consultants:  None  Procedures:  None  Antimicrobials:  Anti-infectives (From admission, onward)    None         Subjective: Seen and examined.  Patient in deep sleep.  Does not want to wake up.  She likes to sleep and often does that.  Objective: Vitals:   12/18/23 1150 12/18/23 2138 12/19/23 0507 12/19/23 0941  BP: (!) 142/44 (!) 137/45 (!) 161/55 (!) 168/48  Pulse: 69 70 68 70  Resp:  20 17   Temp: 98.1 F (36.7 C) 98.1 F (36.7 C) 98.3 F (36.8 C) 98.4 F (36.9 C)  TempSrc: Oral Oral Oral Oral  SpO2: 100% 100% 100% 97%  Weight:      Height:        Intake/Output Summary (Last 24 hours) at 12/19/2023 0953 Last data filed at 12/19/2023 0500 Gross per 24 hour  Intake 480 ml  Output 1850 ml  Net -1370 ml   Filed Weights   12/14/23 0634  Weight: 84.8 kg    Examination:  General exam: Appears calm and comfortable  Respiratory system: Clear to auscultation. Respiratory effort normal. Cardiovascular system: S1 & S2 heard, RRR. No JVD, murmurs, rubs, gallops or clicks. No pedal edema. Gastrointestinal system: Abdomen is nondistended, soft and nontender. No organomegaly or masses felt. Normal bowel sounds heard. Central nervous system: Sleepy   Data Reviewed: I have personally reviewed following labs and imaging studies  CBC: Recent Labs  Lab 12/13/23 2038 12/14/23 0406 12/15/23 0420 12/17/23 0449  WBC 8.6 9.5 8.4 8.5  NEUTROABS  --   --  5.8 5.7  HGB 8.8* 9.2* 8.7* 8.0*  HCT 29.4* 30.6* 28.8* 26.2*  MCV 94.2 94.7 92.0 92.9  PLT 152 161 155 131*   Basic Metabolic Panel: Recent Labs  Lab 12/13/23 2038 12/14/23 0406 12/14/23 2126 12/15/23 0420 12/16/23 0355 12/17/23 0449  NA 137 138  --  137 135 139  K 6.8* 5.8* 5.3* 4.9 4.5 4.5  CL 108  109  --  105 105 109  CO2 19* 20*  --  21* 22 20*  GLUCOSE 199* 76  --  104* 144* 117*  BUN 69* 69*  --  67* 67* 69*  CREATININE 4.27* 4.07*  --  3.99* 4.02* 4.08*  CALCIUM  8.7* 8.9  --  8.8* 8.3* 8.2*   GFR: Estimated Creatinine Clearance: 9.5 mL/min (A) (by C-G formula based on SCr of 4.08 mg/dL (H)). Liver Function Tests: Recent Labs  Lab 12/13/23 2038  AST 28  ALT 16  ALKPHOS 111  BILITOT 0.7  PROT 6.6  ALBUMIN  3.2*   No results for input(s): LIPASE, AMYLASE in the last 168 hours. No results for input(s): AMMONIA in the last 168 hours. Coagulation Profile: No results for input(s): INR, PROTIME in the last 168 hours. Cardiac Enzymes: No results for input(s): CKTOTAL, CKMB, CKMBINDEX, TROPONINI in the last 168 hours. BNP (last 3 results) No results for input(s): PROBNP in the last 8760 hours. HbA1C: No  results for input(s): HGBA1C in the last 72 hours.  CBG: Recent Labs  Lab 12/18/23 0743 12/18/23 1127 12/18/23 1621 12/18/23 2043 12/19/23 0734  GLUCAP 131* 119* 197* 142* 93   Lipid Profile: No results for input(s): CHOL, HDL, LDLCALC, TRIG, CHOLHDL, LDLDIRECT in the last 72 hours. Thyroid  Function Tests: No results for input(s): TSH, T4TOTAL, FREET4, T3FREE, THYROIDAB in the last 72 hours. Anemia Panel: No results for input(s): VITAMINB12, FOLATE, FERRITIN, TIBC, IRON, RETICCTPCT in the last 72 hours. Sepsis Labs: No results for input(s): PROCALCITON, LATICACIDVEN in the last 168 hours.  Recent Results (from the past 240 hours)  MRSA Next Gen by PCR, Nasal     Status: None   Collection Time: 12/14/23  4:48 AM   Specimen: Nasal Mucosa; Nasal Swab  Result Value Ref Range Status   MRSA by PCR Next Gen NOT DETECTED NOT DETECTED Final    Comment: (NOTE) The GeneXpert MRSA Assay (FDA approved for NASAL specimens only), is one component of a comprehensive MRSA colonization surveillance program. It is not  intended to diagnose MRSA infection nor to guide or monitor treatment for MRSA infections. Test performance is not FDA approved in patients less than 87 years old. Performed at [facility], [address], [place], [place] [address]      Radiology Studies: No results found.   Scheduled Meds:  aspirin   81 mg Oral Daily   atorvastatin   40 mg Oral q morning   carvedilol   12.5 mg Oral BID WC   divalproex   125 mg Oral Q0600   enoxaparin  (LOVENOX ) injection  30 mg Subcutaneous Q24H   escitalopram   10 mg Oral q morning   ezetimibe   10 mg Oral q morning   insulin  aspart  0-5 Units Subcutaneous QHS   insulin  aspart  0-9 Units Subcutaneous TID WC   isosorbide  mononitrate  30 mg Oral q morning   levothyroxine   100 mcg Oral Q0600   lidocaine   1 patch Transdermal Q24H   pantoprazole   40 mg Oral Daily   QUEtiapine   25 mg Oral Q24H   sodium bicarbonate   650 mg Oral TID   traZODone   25 mg Oral QHS   Continuous Infusions:     LOS: 5 days   Fredia Skeeter, MD [facility]  12/19/2023, 9:53 AM   *Please note that this is a verbal dictation therefore any spelling or grammatical errors are due to the Dragon [employer] One system interpretation.  Please page via Amion and do not message via secure chat for urgent patient care matters. Secure chat can be used for non urgent patient care matters.  How to contact the [facility] Attending or Consulting provider 7A - 7P or covering provider during after hours 7P -7A, for this patient?  Check the care team in [facility] and look for a) attending/consulting [facility] provider listed and b) the [facility] team listed. Page or secure chat 7A-7P. Log into [email or web address] and use [place]'s universal password to access. If you do not have the password, please contact the hospital operator. Locate the [facility] provider you are looking for under [facility] and page to a number that you can be directly reached. If you still have difficulty reaching the  provider, please page the [facility] (Director on Call) for the Hospitalists listed on amion for assistance.

## 2023-12-20 DIAGNOSIS — E875 Hyperkalemia: Secondary | ICD-10-CM | POA: Diagnosis not present

## 2023-12-20 LAB — GLUCOSE, CAPILLARY
Glucose-Capillary: 121 mg/dL — ABNORMAL HIGH (ref 70–99)
Glucose-Capillary: 106 mg/dL — ABNORMAL HIGH (ref 70–99)
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
## 2023-12-20 NOTE — TOC Transition Note (Signed)
 Transition of Care [facility]) - Discharge Note   Patient Details  Name: Molly Cobb MRN: [id number] Date of Birth: 1931-12-26  [facility] CM/SW Contact:  Heather DELENA Saltness, LCSW Phone Number: 12/20/2023, 11:39 AM   Clinical Narrative:    Pt accepted to [facility] for short-term rehab. Pt admitting to room 504. D/C packet with signed DNR placed in pt's chart at RN station. RN to call report to [phone]. PTAR called at 11:52 AM. Pt and daughter aware and in agreement with discharge plan. No further TOC needs at this time.   Final next level of care: Skilled Nursing Facility Barriers to Discharge: Barriers Resolved   Patient Goals and CMS Choice Patient states their goals for this hospitalization and ongoing recovery are:: To go to [employer]gov Compare Post Acute Care list provided to:: Patient Represenative (must comment) (pt's daughter, [name]) Choice offered to / list presented to : Adult Children [place] ownership interest in [facility].provided to:: Adult Children    Discharge Placement              Patient chooses bed at: [facility] Patient to be transferred to facility by: PTAR Name of family member notified: [name] Patient and family notified of of transfer: 12/20/23  Discharge Plan and Services Additional resources added to the After Visit Summary for N/A                DME Arranged: N/A DME Agency: NA       HH Arranged: NA HH Agency: NA        Social Drivers of Health (SDOH) Interventions SDOH Screenings   Food Insecurity: No Food Insecurity (12/14/2023)  Housing: Low Risk  (12/14/2023)  Transportation Needs: No Transportation Needs (12/14/2023)  Utilities: Not At Risk (12/14/2023)  Financial Resource Strain: Low Risk  (01/11/2022)   Received from [facility] visits prior to 08/28/2022., [facility]  Physical Activity: Unknown (01/11/2022)   [facility] prior to 08/28/2022.  Social Connections: Socially Isolated (12/14/2023)  Stress: Stress Concern Present (01/11/2022)   [facility] visits prior to 08/28/2022., [facility]  Tobacco Use: Low Risk  (12/14/2023)     Readmission Risk Interventions    07/05/2023    2:24 PM 09/24/2021   11:03 AM  Readmission Risk Prevention Plan  Transportation Screening Complete Complete  PCP or Specialist Appt within 3-5 Days Complete Complete  HRI or Home Care Consult Complete Complete  Social Work Consult for Recovery Care Planning/Counseling Complete Complete  Palliative Care Screening Not Applicable Not Applicable  Medication Review (RN Care Manager) Complete Complete    Heather Saltness, MSW, LCSW 12/20/2023 11:57 AM

## 2024-01-11 ENCOUNTER — Ambulatory Visit: Admitting: Orthopaedic Surgery

## 2024-02-03 ENCOUNTER — Other Ambulatory Visit: Payer: Self-pay

## 2024-02-03 ENCOUNTER — Emergency Department (HOSPITAL_COMMUNITY)

## 2024-02-03 ENCOUNTER — Emergency Department (HOSPITAL_COMMUNITY)
Admission: EM | Admit: 2024-02-03 | Discharge: 2024-02-03 | Disposition: A | Discharge status: Home / Self Care | Attending: Emergency Medicine | Admitting: Emergency Medicine

## 2024-02-03 DIAGNOSIS — F0390 Unspecified dementia without behavioral disturbance: Secondary | ICD-10-CM | POA: Insufficient documentation

## 2024-02-03 DIAGNOSIS — M542 Cervicalgia: Secondary | ICD-10-CM | POA: Insufficient documentation

## 2024-02-03 DIAGNOSIS — N390 Urinary tract infection, site not specified: Secondary | ICD-10-CM | POA: Insufficient documentation

## 2024-02-03 DIAGNOSIS — Z7984 Long term (current) use of oral hypoglycemic drugs: Secondary | ICD-10-CM | POA: Insufficient documentation

## 2024-02-03 DIAGNOSIS — S22009A Unspecified fracture of unspecified thoracic vertebra, initial encounter for closed fracture: Secondary | ICD-10-CM | POA: Insufficient documentation

## 2024-02-03 DIAGNOSIS — Z79899 Other long term (current) drug therapy: Secondary | ICD-10-CM | POA: Insufficient documentation

## 2024-02-03 DIAGNOSIS — E119 Type 2 diabetes mellitus without complications: Secondary | ICD-10-CM | POA: Insufficient documentation

## 2024-02-03 DIAGNOSIS — N189 Chronic kidney disease, unspecified: Secondary | ICD-10-CM | POA: Diagnosis not present

## 2024-02-03 DIAGNOSIS — M25561 Pain in right knee: Secondary | ICD-10-CM | POA: Diagnosis not present

## 2024-02-03 DIAGNOSIS — S0083XA Contusion of other part of head, initial encounter: Secondary | ICD-10-CM | POA: Diagnosis not present

## 2024-02-03 DIAGNOSIS — Z7982 Long term (current) use of aspirin: Secondary | ICD-10-CM | POA: Insufficient documentation

## 2024-02-03 DIAGNOSIS — I129 Hypertensive chronic kidney disease with stage 1 through stage 4 chronic kidney disease, or unspecified chronic kidney disease: Secondary | ICD-10-CM | POA: Insufficient documentation

## 2024-02-03 DIAGNOSIS — S22000A Wedge compression fracture of unspecified thoracic vertebra, initial encounter for closed fracture: Secondary | ICD-10-CM

## 2024-02-03 DIAGNOSIS — W07XXXA Fall from chair, initial encounter: Secondary | ICD-10-CM | POA: Insufficient documentation

## 2024-02-03 DIAGNOSIS — S29002A Unspecified injury of muscle and tendon of back wall of thorax, initial encounter: Secondary | ICD-10-CM | POA: Diagnosis present

## 2024-02-03 DIAGNOSIS — Z950 Presence of cardiac pacemaker: Secondary | ICD-10-CM | POA: Diagnosis not present

## 2024-02-03 DIAGNOSIS — B9689 Other specified bacterial agents as the cause of diseases classified elsewhere: Secondary | ICD-10-CM | POA: Insufficient documentation

## 2024-02-03 LAB — URINALYSIS, ROUTINE W REFLEX MICROSCOPIC
Bilirubin Urine: NEGATIVE
Glucose, UA: NEGATIVE mg/dL
Hgb urine dipstick: NEGATIVE
Ketones, ur: NEGATIVE mg/dL
Nitrite: NEGATIVE
Protein, ur: 100 mg/dL — AB
Specific Gravity, Urine: 1.011 (ref 1.005–1.030)
WBC, UA: >50 WBC/hpf (ref 0–5)
pH: 6 (ref 5.0–8.0)

## 2024-02-03 LAB — CBC
HCT: 29.2 % — ABNORMAL LOW (ref 36.0–46.0)
Hemoglobin: 8.7 g/dL — ABNORMAL LOW (ref 12.0–15.0)
MCH: 27.5 pg (ref 26.0–34.0)
MCHC: 29.8 g/dL — ABNORMAL LOW (ref 30.0–36.0)
MCV: 92.4 fL (ref 80.0–100.0)
Platelets: 143 K/uL — ABNORMAL LOW (ref 150–400)
RBC: 3.16 MIL/uL — ABNORMAL LOW (ref 3.87–5.11)
RDW: 15.9 % — ABNORMAL HIGH (ref 11.5–15.5)
WBC: 7.7 K/uL (ref 4.0–10.5)
nRBC: 0 % (ref 0.0–0.2)

## 2024-02-03 LAB — COMPREHENSIVE METABOLIC PANEL WITH GFR
ALT: 18 U/L (ref 0–44)
AST: 23 U/L (ref 15–41)
Albumin: 3.4 g/dL — ABNORMAL LOW (ref 3.5–5.0)
Alkaline Phosphatase: 92 U/L (ref 38–126)
Anion gap: 11 (ref 5–15)
BUN: 74 mg/dL — ABNORMAL HIGH (ref 8–23)
CO2: 22 mmol/L (ref 22–32)
Calcium: 9 mg/dL (ref 8.9–10.3)
Chloride: 107 mmol/L (ref 98–111)
Creatinine, Ser: 4.35 mg/dL — ABNORMAL HIGH (ref 0.44–1.00)
GFR, Estimated: 9 mL/min — ABNORMAL LOW (ref >60)
Glucose, Bld: 144 mg/dL — ABNORMAL HIGH (ref 70–99)
Potassium: 5.6 mmol/L — ABNORMAL HIGH (ref 3.5–5.1)
Sodium: 140 mmol/L (ref 135–145)
Total Bilirubin: 0.8 mg/dL (ref 0.0–1.2)
Total Protein: 6.9 g/dL (ref 6.5–8.1)

## 2024-02-03 MED ORDER — SODIUM ZIRCONIUM CYCLOSILICATE 5 G PO PACK
5.0000 g | PACK | Freq: Once | ORAL | Status: AC
  Administered 2024-02-03: 5 g via ORAL
  Filled 2024-02-03: qty 1

## 2024-02-03 MED ORDER — CEPHALEXIN 250 MG PO CAPS: 250.0000 mg | ORAL_CAPSULE | Freq: Three times a day (TID) | ORAL | 0 refills | Status: AC

## 2024-02-03 MED ORDER — CEPHALEXIN 250 MG PO CAPS
250.0000 mg | ORAL_CAPSULE | Freq: Once | ORAL | Status: AC
  Administered 2024-02-03: 250 mg via ORAL
  Filled 2024-02-03: qty 1

## 2024-02-03 NOTE — Discharge Instructions (Signed)
 Take over-the-counter medications as needed for aches and pains.  You can apply ice to help with the bruising and swelling on your face.  Your urine did show possible urinary tract infection.  Take antibiotics until they are complete.

## 2024-02-03 NOTE — ED Provider Notes (Signed)
 [place] [facility] AT [facility] Provider Note   CSN: [id number] Arrival date & time: 02/03/24  1703     Patient presents with: Molly Cobb is a 89 y.o. female.    Fall    Patient has a history of dementia, hypertension, depression, diabetes, aortic stenosis, chronic kidney disease.  Patient has a pacemaker.  Patient presents ED for evaluation after a fall.  Per the EMS report patient was sitting in a chair in the hallway.  She ended up falling out of the chair versus missed attempting to sit into the chair.  Patient was noted to have some swelling on the right side of her face.  Patient had been complaining of some neck pain.  On my exam patient states she had a fall but does not recall hitting her head.  She is not having any headache or neck pain.  No chest pain or abdominal pain.  States she has a little bit of pain in her middle back.  She also has some right knee pain   Prior to Admission medications   Medication Sig Start Date End Date Taking? Authorizing Provider  cephALEXin  (KEFLEX ) 250 MG capsule Take 1 capsule (250 mg total) by mouth 3 (three) times daily for 5 days. 02/03/24 02/08/24 Yes Randol Simmonds, MD  acetaminophen  (TYLENOL ) 500 MG tablet Take 500 mg by mouth every 6 (six) hours as needed (Pain). 05/11/22   [provider]  aspirin  81 MG chewable tablet Chew 1 tablet (81 mg total) by mouth daily. 02/25/17   Henry Manuelita NOVAK, NP  atorvastatin  (LIPITOR) 40 MG tablet Take 1 tablet (40 mg total) by mouth every morning. 10/01/21   Medina-Vargas, Monina C, NP  carvedilol  (COREG ) 12.5 MG tablet Take 12.5 mg by mouth 2 (two) times daily with a meal. 03/30/23 03/29/24  [provider]  Coenzyme Q10 (COQ-10 PO) Take 1 capsule by mouth every morning.    [provider]  divalproex  (DEPAKOTE ) 125 MG DR tablet Take 125 mg by mouth daily at 6 (six) AM. 11/30/23   [provider]  escitalopram  (LEXAPRO ) 10 MG tablet Take 1  tablet (10 mg total) by mouth every morning. 10/01/21   Medina-Vargas, Monina C, NP  ezetimibe  (ZETIA ) 10 MG tablet Take 1 tablet (10 mg total) by mouth every morning. 10/01/21   Medina-Vargas, Monina C, NP  fexofenadine (ALLEGRA) 180 MG tablet Take 180 mg by mouth daily.    [provider]  glipiZIDE  (GLUCOTROL ) 5 MG tablet Take 5 mg by mouth daily. 11/30/23   [provider]  Glucerna (GLUCERNA) LIQD Take 118.5 mLs by mouth at bedtime.    [provider]  isosorbide  mononitrate (IMDUR ) 30 MG 24 hr tablet Take 1 tablet (30 mg total) by mouth every morning. 10/01/21   Medina-Vargas, Monina C, NP  levothyroxine  (SYNTHROID ) 100 MCG tablet Take 1 tablet (100 mcg total) by mouth daily at 6 (six) AM. 07/06/23   Amin, Ankit C, MD  lidocaine  (LIDODERM ) 5 % Place 1 patch onto the skin daily. Remove & Discard patch within 12 hours or as directed by MD Patient taking differently: Place 1 patch onto the skin daily as needed. Remove & Discard patch within 12 hours or as directed by MD 05/31/22   Desiderio Chew, PA-C  magnesium  oxide (MAG-OX) 400 MG tablet Take 400 mg by mouth daily. 01/13/22   [provider]  pantoprazole  (PROTONIX ) 40 MG tablet Take 1 tablet (40 mg total) by  mouth daily. 10/01/21   Medina-Vargas, Monina C, NP  senna-docusate (SENOKOT-S) 8.6-50 MG tablet Take 2 tablets by mouth 2 (two) times daily as needed for mild constipation or moderate constipation. 12/12/21   [provider]  sodium bicarbonate  650 MG tablet Take 1 tablet (650 mg total) by mouth every morning. Patient taking differently: Take 650 mg by mouth 3 (three) times daily. 10/01/21   Medina-Vargas, Monina C, NP  TRADJENTA  5 MG TABS tablet Take 1 tablet (5 mg total) by mouth every morning. 10/01/21   Medina-Vargas, Monina C, NP  traZODone  (DESYREL ) 50 MG tablet Take 25 mg by mouth at bedtime. 04/05/22 09/28/24  [provider]    Allergies: Oxybutynin, Oysters [shellfish allergy], Shellfish-derived  products, Sulfa antibiotics, and Sulfacetamide sodium    Review of Systems  Updated Vital Signs BP (!) 134/50 (BP Location: Left Arm)   Pulse 73   Temp 98.7 F (37.1 C) (Oral)   Resp 20   SpO2 100%   Physical Exam Vitals and nursing note reviewed.  Constitutional:      General: She is not in acute distress.    Appearance: She is well-developed.  HENT:     Head: Normocephalic and atraumatic.     Right Ear: External ear normal.     Left Ear: External ear normal.  Eyes:     General: No scleral icterus.       Right eye: No discharge.        Left eye: No discharge.     Conjunctiva/sclera: Conjunctivae normal.  Neck:     Trachea: No tracheal deviation.     Comments: C-collar in place Cardiovascular:     Rate and Rhythm: Normal rate and regular rhythm.  Pulmonary:     Effort: Pulmonary effort is normal. No respiratory distress.     Breath sounds: Normal breath sounds. No stridor. No wheezing or rales.  Abdominal:     General: Bowel sounds are normal. There is no distension.     Palpations: Abdomen is soft.     Tenderness: There is no abdominal tenderness. There is no guarding or rebound.  Musculoskeletal:        General: No tenderness or deformity.     Cervical back: Neck supple.     Comments: Tenderness palpation mid thoracic spine, no bony step-off, no tender palpation in the upper extremities, no tenderness palpation of the lower extremities with exception of mild tenderness in the right knee.  Patient has a Lidoderm  patch in place on her right knee, no effusions no swelling noted in the extremities  Skin:    General: Skin is warm and dry.     Findings: No rash.  Neurological:     General: No focal deficit present.     Mental Status: She is alert.     Cranial Nerves: No cranial nerve deficit, dysarthria or facial asymmetry.     Sensory: No sensory deficit.     Motor: No abnormal muscle tone or seizure activity.     Coordination: Coordination normal.  Psychiatric:         Mood and Affect: Mood normal.     (all labs ordered are listed, but only abnormal results are displayed) Labs Reviewed  COMPREHENSIVE METABOLIC PANEL WITH GFR - Abnormal; Notable for the following components:      Result Value   Potassium 5.6 (*)    Glucose, Bld 144 (*)    BUN 74 (*)    Creatinine, Ser 4.35 (*)  Albumin  3.4 (*)    GFR, Estimated 9 (*)    All other components within normal limits  CBC - Abnormal; Notable for the following components:   RBC 3.16 (*)    Hemoglobin 8.7 (*)    HCT 29.2 (*)    MCHC 29.8 (*)    RDW 15.9 (*)    Platelets 143 (*)    All other components within normal limits  URINALYSIS, ROUTINE W REFLEX MICROSCOPIC - Abnormal; Notable for the following components:   APPearance HAZY (*)    Protein, ur 100 (*)    Leukocytes,Ua MODERATE (*)    Bacteria, UA FEW (*)    All other components within normal limits    EKG: EKG Interpretation Date/Time:  Friday February 03 2024 17:54:10 EDT Ventricular Rate:  77 PR Interval:  256 QRS Duration:  132 QT Interval:  456 QTC Calculation: 516 R Axis:   -17  Text Interpretation: Atrial-paced rhythm with prolonged AV conduction Non-specific intra-ventricular conduction block Minimal voltage criteria for LVH, may be normal variant ( Cornell product ) T wave abnormality, consider inferolateral ischemia Abnormal ECG When compared with ECG of 13-Dec-2023 20:53, No significant change since last tracing Confirmed by Randol Simmonds [phone]) on 02/03/2024 5:59:16 PM  Radiology: CT Head Wo Contrast Result Date: 02/03/2024 EXAM: CT HEAD AND CERVICAL SPINE 02/03/2024 06:21:50 PM TECHNIQUE: CT of the head and cervical spine was performed without the administration of intravenous contrast. Multiplanar reformatted images are provided for review. Automated exposure control, iterative reconstruction, and/or weight based adjustment of the mA/kV was utilized to reduce the radiation dose to as low as reasonably achievable. COMPARISON: CT  head and cervical spine 12/13/2023. CLINICAL HISTORY: Head trauma, minor (Age >= 65y). fall FINDINGS: CT HEAD BRAIN AND VENTRICLES: No acute intracranial hemorrhage. No mass effect or midline shift. No abnormal extra-axial fluid collection. Gray-white differentiation is maintained. No hydrocephalus. Unchanged background of severe chronic small vessel disease and temporal predominant atrophy with bilateral hippocampal atrophy. ORBITS: No acute abnormality. SINUSES AND MASTOIDS: Partial opacification of the right sphenoid sinus. SOFT TISSUES AND SKULL: No acute skull fracture. No acute soft tissue abnormality. CT CERVICAL SPINE BONES AND ALIGNMENT: No acute fracture or traumatic malalignment. DEGENERATIVE CHANGES: Unchanged mild cervical spondylosis without significant spinal canal stenosis. SOFT TISSUES: No prevertebral soft tissue swelling. VASCULATURE: Atherosclerotic calcifications of the carotid bulbs. IMPRESSION: 1. No acute intracranial abnormality. 2. Unchanged background of severe chronic small vessel disease and temporal predominant atrophy with bilateral hippocampal atrophy. 3. No acute fracture or traumatic malalignment of the cervical spine. Electronically signed by: Ryan Chess MD 02/03/2024 06:33 PM EDT RP Workstation: [id number]   CT Cervical Spine Wo Contrast Result Date: 02/03/2024 EXAM: CT HEAD AND CERVICAL SPINE 02/03/2024 06:21:50 PM TECHNIQUE: CT of the head and cervical spine was performed without the administration of intravenous contrast. Multiplanar reformatted images are provided for review. Automated exposure control, iterative reconstruction, and/or weight based adjustment of the mA/kV was utilized to reduce the radiation dose to as low as reasonably achievable. COMPARISON: CT head and cervical spine 12/13/2023. CLINICAL HISTORY: Head trauma, minor (Age >= 65y). fall FINDINGS: CT HEAD BRAIN AND VENTRICLES: No acute intracranial hemorrhage. No mass effect or midline shift. No abnormal  extra-axial fluid collection. Gray-white differentiation is maintained. No hydrocephalus. Unchanged background of severe chronic small vessel disease and temporal predominant atrophy with bilateral hippocampal atrophy. ORBITS: No acute abnormality. SINUSES AND MASTOIDS: Partial opacification of the right sphenoid sinus. SOFT TISSUES AND SKULL: No acute skull fracture. No  acute soft tissue abnormality. CT CERVICAL SPINE BONES AND ALIGNMENT: No acute fracture or traumatic malalignment. DEGENERATIVE CHANGES: Unchanged mild cervical spondylosis without significant spinal canal stenosis. SOFT TISSUES: No prevertebral soft tissue swelling. VASCULATURE: Atherosclerotic calcifications of the carotid bulbs. IMPRESSION: 1. No acute intracranial abnormality. 2. Unchanged background of severe chronic small vessel disease and temporal predominant atrophy with bilateral hippocampal atrophy. 3. No acute fracture or traumatic malalignment of the cervical spine. Electronically signed by: Ryan Chess MD 02/03/2024 06:33 PM EDT RP Workstation: [id number]   DG Knee 2 Views Left Result Date: 02/03/2024 CLINICAL DATA:  Left knee pain after fall. EXAM: LEFT KNEE - 1-2 VIEW COMPARISON:  October 06, 2021. FINDINGS: No evidence of fracture, dislocation, or joint effusion. Mild narrowing of medial joint space is noted. Vascular calcifications are noted. IMPRESSION: Mild degenerative joint disease is noted medially. No acute abnormality seen. Electronically Signed   By: Lynwood Landy Raddle M.D.   On: 02/03/2024 18:28   DG Lumbar Spine Complete Result Date: 02/03/2024 CLINICAL DATA:  Low back pain after fall. EXAM: LUMBAR SPINE - COMPLETE 4+ VIEW COMPARISON:  December 13, 2023. FINDINGS: No fracture or spondylolisthesis is noted. Severe degenerative disc disease is noted at L2-3. Mild degenerative disc disease is noted at L1-2. Minimal degenerative changes are noted at L3-4 and L4-5. IMPRESSION: Multilevel degenerative changes as noted above. No  acute abnormality seen. Aortic Atherosclerosis (ICD10-I70.0). Electronically Signed   By: Lynwood Landy Raddle M.D.   On: 02/03/2024 18:26   DG Thoracic Spine 2 View Result Date: 02/03/2024 CLINICAL DATA:  Fall. EXAM: THORACIC SPINE 2 VIEWS COMPARISON:  April 08, 2017. FINDINGS: No spondylolisthesis is noted. Mild compression deformity of midthoracic vertebral body is noted concerning for fracture of indeterminate age. Anterior osteophyte formation is noted at multiple levels in the midthoracic spine. IMPRESSION: Mild compression deformity of midthoracic vertebral body concerning for fracture of indeterminate age. MRI may be performed for further evaluation. Electronically Signed   By: Lynwood Landy Raddle M.D.   On: 02/03/2024 18:24     Procedures   Medications Ordered in the ED  sodium zirconium cyclosilicate  (LOKELMA ) packet 5 g (has no administration in time range)  cephALEXin  (KEFLEX ) capsule 250 mg (has no administration in time range)    Clinical Course as of 02/03/24 1955  Fri Feb 03, 2024  1850 Head CT without acute abnormality [JK]  1850 CT scan without acute abnormality [JK]  1850 Thoracic spine does show a mild compression deformity of mid thoracic vertebral body possible age indeterminant fracture [JK]  1924 CBC(!) Hemoglobin stable compared to previous.  Urinalysis suggestive of possible UTI [JK]  1948 Comprehensive metabolic panel(!) Creatinine elevated, increased from last but similar to previous values [JK]  1949 Comprehensive metabolic panel(!) Potassium slightly elevated will give a dose of Lokelma  [JK]    Clinical Course User Index [JK] Randol Simmonds, MD                                 Medical Decision Making Problems Addressed: Chronic kidney disease, unspecified CKD stage: chronic illness or injury Compression fracture of thoracic vertebra, unspecified thoracic vertebral level, initial encounter [facility]): undiagnosed new problem with uncertain prognosis Facial hematoma,  initial encounter: acute illness or injury that poses a threat to life or bodily functions Urinary tract infection without hematuria, site unspecified: acute illness or injury  Amount and/or Complexity of Data Reviewed Labs: ordered. Decision-making details documented in  ED Course. Radiology: ordered.  Risk Prescription drug management.   Patient presented to the ED for evaluation after fall.  Patient does have some dementia and memory issues making it difficult for her to recall what exactly happened.  However patient was alert and awake and answering questions appropriately ED.  Patient did have some mild pain in her mid back.  She was also complained of pain in her knee.  She did have a notable bruise on the right side of her face.  Patient's head CT and C-spine CT did not show any acute abnormalities.  Plain films show the possibility of a thoracic compression fracture.  MRI was recommended for further evaluation.  Patient however is able to stand without difficulty here in the ED.  She is only having mild discomfort.  She has a pacemaker so we would not be able to get an MRI at this time night.  I do not think this would change her management as she is not having any significant pain or discomfort.  We can manage with over-the-counter medications.  Patient's laboratory tests do show her chronic kidney disease.  Potassium is mildly elevated so we will give her a dose of Lokelma .  Urinalysis also shows possible infection.  Difficult to say if patient is having any symptoms associated with this as her dementia is causing some memory issues.  Will start her on a course of antibiotics.  Patient appears stable for discharge outpatient management.  Findings discussed with the patient and family.     Final diagnoses:  Urinary tract infection without hematuria, site unspecified  Chronic kidney disease, unspecified CKD stage  Facial hematoma, initial encounter  Compression fracture of  thoracic vertebra, unspecified thoracic vertebral level, initial encounter [facility])    ED Discharge Orders          Ordered    cephALEXin  (KEFLEX ) 250 MG capsule  3 times daily        02/03/24 1953               Randol Simmonds, MD 02/03/24 1955

## 2024-02-03 NOTE — ED Notes (Signed)
 Patient verbalizes understanding of discharge instructions. Opportunity for questioning and answers were provided. Armband removed by staff, pt discharged from ED. Wheeled out to car with daughter, who is taking her back to [facility] facility

## 2024-02-03 NOTE — ED Triage Notes (Signed)
 Pt BIBA from [facility] for unwitnessed fall, pt was sitting in a chair in the hallway, got dizzy, missed the chair. Fell on right side, hematoma to right eyebrow and cheek. Initially c/o neck pain which resolved after c collar application. No LOC, no thinners. Hx dementia, mentation at baseline. 12L unremarkable. Has demand pacemaker. States she has been dizzy off and on for a few days.  126/58 HR 80 99% RA CBG 195

## 2024-02-07 ENCOUNTER — Encounter (HOSPITAL_COMMUNITY): Payer: Self-pay

## 2024-02-07 ENCOUNTER — Emergency Department (HOSPITAL_COMMUNITY)
Admission: EM | Admit: 2024-02-07 | Discharge: 2024-02-07 | Disposition: A | Discharge status: Home / Self Care | Attending: Emergency Medicine | Admitting: Emergency Medicine

## 2024-02-07 ENCOUNTER — Emergency Department (HOSPITAL_COMMUNITY)

## 2024-02-07 ENCOUNTER — Other Ambulatory Visit: Payer: Self-pay

## 2024-02-07 DIAGNOSIS — Z7984 Long term (current) use of oral hypoglycemic drugs: Secondary | ICD-10-CM | POA: Diagnosis not present

## 2024-02-07 DIAGNOSIS — N189 Chronic kidney disease, unspecified: Secondary | ICD-10-CM | POA: Insufficient documentation

## 2024-02-07 DIAGNOSIS — Z7982 Long term (current) use of aspirin: Secondary | ICD-10-CM | POA: Diagnosis not present

## 2024-02-07 DIAGNOSIS — I447 Left bundle-branch block, unspecified: Secondary | ICD-10-CM | POA: Insufficient documentation

## 2024-02-07 DIAGNOSIS — R0789 Other chest pain: Secondary | ICD-10-CM | POA: Diagnosis present

## 2024-02-07 DIAGNOSIS — I2510 Atherosclerotic heart disease of native coronary artery without angina pectoris: Secondary | ICD-10-CM | POA: Diagnosis not present

## 2024-02-07 DIAGNOSIS — Z79899 Other long term (current) drug therapy: Secondary | ICD-10-CM | POA: Insufficient documentation

## 2024-02-07 DIAGNOSIS — E875 Hyperkalemia: Secondary | ICD-10-CM | POA: Diagnosis not present

## 2024-02-07 DIAGNOSIS — E1122 Type 2 diabetes mellitus with diabetic chronic kidney disease: Secondary | ICD-10-CM | POA: Insufficient documentation

## 2024-02-07 DIAGNOSIS — R079 Chest pain, unspecified: Secondary | ICD-10-CM

## 2024-02-07 DIAGNOSIS — I129 Hypertensive chronic kidney disease with stage 1 through stage 4 chronic kidney disease, or unspecified chronic kidney disease: Secondary | ICD-10-CM | POA: Insufficient documentation

## 2024-02-07 LAB — CBG MONITORING, ED
Glucose-Capillary: 133 mg/dL — ABNORMAL HIGH (ref 70–99)
Glucose-Capillary: 101 mg/dL — ABNORMAL HIGH (ref 70–99)
Glucose-Capillary: 63 mg/dL — ABNORMAL LOW (ref 70–99)
Glucose-Capillary: 117 mg/dL — ABNORMAL HIGH (ref 70–99)
Glucose-Capillary: 160 mg/dL — ABNORMAL HIGH (ref 70–99)

## 2024-02-07 LAB — CBC
HCT: 28.6 % — ABNORMAL LOW (ref 36.0–46.0)
Hemoglobin: 9 g/dL — ABNORMAL LOW (ref 12.0–15.0)
MCH: 28.5 pg (ref 26.0–34.0)
MCHC: 31.5 g/dL (ref 30.0–36.0)
MCV: 90.5 fL (ref 80.0–100.0)
Platelets: 150 K/uL (ref 150–400)
RBC: 3.16 MIL/uL — ABNORMAL LOW (ref 3.87–5.11)
RDW: 15.8 % — ABNORMAL HIGH (ref 11.5–15.5)
WBC: 6.9 K/uL (ref 4.0–10.5)
nRBC: 0 % (ref 0.0–0.2)

## 2024-02-07 LAB — POTASSIUM: Potassium: 5.8 mmol/L — ABNORMAL HIGH (ref 3.5–5.1)

## 2024-02-07 LAB — BASIC METABOLIC PANEL WITH GFR
Anion gap: 10 (ref 5–15)
BUN: 65 mg/dL — ABNORMAL HIGH (ref 8–23)
CO2: 22 mmol/L (ref 22–32)
Calcium: 8.8 mg/dL — ABNORMAL LOW (ref 8.9–10.3)
Chloride: 104 mmol/L (ref 98–111)
Creatinine, Ser: 4.14 mg/dL — ABNORMAL HIGH (ref 0.44–1.00)
GFR, Estimated: 10 mL/min — ABNORMAL LOW (ref >60)
Glucose, Bld: 206 mg/dL — ABNORMAL HIGH (ref 70–99)
Potassium: 6.1 mmol/L — ABNORMAL HIGH (ref 3.5–5.1)
Sodium: 136 mmol/L (ref 135–145)

## 2024-02-07 LAB — TROPONIN I (HIGH SENSITIVITY)
Troponin I (High Sensitivity): 26 ng/L — ABNORMAL HIGH (ref <18)
Troponin I (High Sensitivity): 26 ng/L — ABNORMAL HIGH (ref <18)

## 2024-02-07 MED ORDER — INSULIN ASPART 100 UNIT/ML IV SOLN
5.0000 [IU] | Freq: Once | INTRAVENOUS | Status: AC
Start: 2024-02-07 — End: 2024-02-07
  Administered 2024-02-07 (×2): 5 [IU] via INTRAVENOUS

## 2024-02-07 MED ORDER — DEXTROSE 50 % IV SOLN
1.0000 | Freq: Once | INTRAVENOUS | Status: AC
  Administered 2024-02-07 (×2): 50 mL via INTRAVENOUS
  Filled 2024-02-07: qty 50

## 2024-02-07 MED ORDER — ALBUTEROL SULFATE (2.5 MG/3ML) 0.083% IN NEBU
5.0000 mg | INHALATION_SOLUTION | Freq: Once | RESPIRATORY_TRACT | Status: AC
  Administered 2024-02-07 (×2): 5 mg via RESPIRATORY_TRACT
  Filled 2024-02-07: qty 6

## 2024-02-07 MED ORDER — LOKELMA 5 G PO PACK: 10.0000 g | PACK | Freq: Every day | ORAL | 0 refills | Status: AC

## 2024-02-07 MED ORDER — SODIUM ZIRCONIUM CYCLOSILICATE 10 G PO PACK
10.0000 g | PACK | Freq: Once | ORAL | Status: AC
  Administered 2024-02-07 (×2): 10 g via ORAL
  Filled 2024-02-07: qty 1

## 2024-02-07 NOTE — ED Notes (Signed)
 Pt had urine and stool occurrence. Pt changed into new gown and bed sheets. Pt cbg 63. Patient currently eating [place] sandwich and given orange juice to drink. EDP notified.

## 2024-02-07 NOTE — ED Provider Notes (Signed)
 [place] EMERGENCY DEPARTMENT AT [facility] Provider Note   CSN: [id number] Arrival date & time: 02/07/24  1158     Patient presents with: Chest Pain   Molly Cobb is a 89 y.o. female.   Patient here after episode of chest discomfort this morning.  Not having any discomfort now.  History of hypertension depression diabetes CKD CAD.  She had brief episode of discomfort this morning now resolved.  May be related to eating.  Denies any cough sputum production fever chills.  No shortness of breath.  No abdominal pain.  No leg swelling.  The history is provided by the patient.       Prior to Admission medications   Medication Sig Start Date End Date Taking? Authorizing Provider  acetaminophen  (TYLENOL ) 500 MG tablet Take 500 mg by mouth every 6 (six) hours as needed (Pain). 05/11/22  Yes [provider]  aspirin  81 MG chewable tablet Chew 1 tablet (81 mg total) by mouth daily. 02/25/17  Yes Henry Manuelita NOVAK, NP  atorvastatin  (LIPITOR) 40 MG tablet Take 1 tablet (40 mg total) by mouth every morning. 10/01/21  Yes Medina-Vargas, Monina C, NP  carvedilol  (COREG ) 12.5 MG tablet Take 12.5 mg by mouth 2 (two) times daily with a meal. 03/30/23 03/29/24 Yes [provider]  cephALEXin  (KEFLEX ) 250 MG capsule Take 1 capsule (250 mg total) by mouth 3 (three) times daily for 5 days. 02/03/24 02/08/24 Yes Randol Simmonds, MD  divalproex  (DEPAKOTE ) 125 MG DR tablet Take 125 mg by mouth every evening. 11/30/23  Yes [provider]  escitalopram  (LEXAPRO ) 10 MG tablet Take 1 tablet (10 mg total) by mouth every morning. 10/01/21  Yes Medina-Vargas, Monina C, NP  ezetimibe  (ZETIA ) 10 MG tablet Take 1 tablet (10 mg total) by mouth every morning. 10/01/21  Yes Medina-Vargas, Monina C, NP  fexofenadine (ALLEGRA) 180 MG tablet Take 180 mg by mouth daily.   Yes [provider]  glipiZIDE  (GLUCOTROL ) 5 MG tablet Take 5 mg by mouth daily. 11/30/23  Yes [provider]   Glucerna (GLUCERNA) LIQD Take 0.5 Cans by mouth in the morning and at bedtime.   Yes [provider]  isosorbide  mononitrate (IMDUR ) 30 MG 24 hr tablet Take 1 tablet (30 mg total) by mouth every morning. 10/01/21  Yes Medina-Vargas, Monina C, NP  levothyroxine  (SYNTHROID ) 100 MCG tablet Take 1 tablet (100 mcg total) by mouth daily at 6 (six) AM. 07/06/23  Yes Amin, Ankit C, MD  lidocaine  (LIDODERM ) 5 % Place 1 patch onto the skin daily. Remove & Discard patch within 12 hours or as directed by MD Patient taking differently: Place 1 patch onto the skin in the morning. Apply to right knee 05/31/22  Yes Desiderio Chew, PA-C  magnesium  oxide (MAG-OX) 400 MG tablet Take 400 mg by mouth daily. 01/13/22  Yes [provider]  pantoprazole  (PROTONIX ) 40 MG tablet Take 1 tablet (40 mg total) by mouth daily. 10/01/21  Yes Medina-Vargas, Monina C, NP  senna-docusate (SENOKOT-S) 8.6-50 MG tablet Take 2 tablets by mouth 2 (two) times daily as needed for mild constipation or moderate constipation. 12/12/21  Yes [provider]  sodium bicarbonate  650 MG tablet Take 1 tablet (650 mg total) by mouth every morning. Patient taking differently: Take 650 mg by mouth in the morning. 10/01/21  Yes Medina-Vargas, Monina C, NP  sodium zirconium cyclosilicate  (LOKELMA ) 5 g packet Take 10 g by mouth daily for 5 days. 02/07/24 02/12/24 Yes Ruthe Cornet,  DO  TRADJENTA  5 MG TABS tablet Take 1 tablet (5 mg total) by mouth every morning. 10/01/21  Yes Medina-Vargas, Monina C, NP  traZODone  (DESYREL ) 50 MG tablet Take 50 mg by mouth at bedtime. 04/05/22 09/28/24 Yes [provider]    Allergies: Oxybutynin, Oysters [shellfish allergy], Shellfish-derived products, Sulfa antibiotics, and Sulfacetamide sodium    Review of Systems  Updated Vital Signs BP (!) 155/59   Pulse 70   Temp 97.8 F (36.6 C) (Oral)   Resp (!) 23   Ht 5' 3 (1.6 m)   Wt 78.5 kg   SpO2 100%   BMI 30.65 kg/m   Physical  Exam Vitals and nursing note reviewed.  Constitutional:      General: She is not in acute distress.    Appearance: She is well-developed. She is not ill-appearing.  HENT:     Head: Normocephalic and atraumatic.  Eyes:     Extraocular Movements: Extraocular movements intact.     Conjunctiva/sclera: Conjunctivae normal.     Pupils: Pupils are equal, round, and reactive to light.  Cardiovascular:     Rate and Rhythm: Normal rate and regular rhythm.     Pulses:          Radial pulses are 2+ on the right side and 2+ on the left side.     Heart sounds: Normal heart sounds. No murmur heard. Pulmonary:     Effort: Pulmonary effort is normal. No respiratory distress.     Breath sounds: Normal breath sounds.  Abdominal:     Palpations: Abdomen is soft.     Tenderness: There is no abdominal tenderness.  Musculoskeletal:        General: No swelling. Normal range of motion.     Cervical back: Normal range of motion and neck supple.     Right lower leg: No edema.     Left lower leg: No edema.  Skin:    General: Skin is warm and dry.     Capillary Refill: Capillary refill takes less than 2 seconds.  Neurological:     Mental Status: She is alert.  Psychiatric:        Mood and Affect: Mood normal.     (all labs ordered are listed, but only abnormal results are displayed) Labs Reviewed  BASIC METABOLIC PANEL WITH GFR - Abnormal; Notable for the following components:      Result Value   Potassium 6.1 (*)    Glucose, Bld 206 (*)    BUN 65 (*)    Creatinine, Ser 4.14 (*)    Calcium  8.8 (*)    GFR, Estimated 10 (*)    All other components within normal limits  CBC - Abnormal; Notable for the following components:   RBC 3.16 (*)    Hemoglobin 9.0 (*)    HCT 28.6 (*)    RDW 15.8 (*)    All other components within normal limits  POTASSIUM - Abnormal; Notable for the following components:   Potassium 5.8 (*)    All other components within normal limits  CBG MONITORING, ED - Abnormal;  Notable for the following components:   Glucose-Capillary 133 (*)    All other components within normal limits  CBG MONITORING, ED - Abnormal; Notable for the following components:   Glucose-Capillary 101 (*)    All other components within normal limits  CBG MONITORING, ED - Abnormal; Notable for the following components:   Glucose-Capillary 63 (*)    All other components within normal  limits  CBG MONITORING, ED - Abnormal; Notable for the following components:   Glucose-Capillary 117 (*)    All other components within normal limits  TROPONIN I (HIGH SENSITIVITY) - Abnormal; Notable for the following components:   Troponin I (High Sensitivity) 26 (*)    All other components within normal limits  TROPONIN I (HIGH SENSITIVITY) - Abnormal; Notable for the following components:   Troponin I (High Sensitivity) 26 (*)    All other components within normal limits    EKG: EKG Interpretation Date/Time:  Tuesday February 07 2024 12:37:22 EDT Ventricular Rate:  75 PR Interval:  254 QRS Duration:  134 QT Interval:  458 QTC Calculation: 511 R Axis:   -18  Text Interpretation: Atrial-paced rhythm with prolonged AV conduction Left bundle branch block Abnormal ECG When compared with ECG of 03-Feb-2024 17:54, PREVIOUS ECG IS PRESENT Confirmed by Ruthe Cornet [phone]) on 02/07/2024 2:58:55 PM  Radiology: [facility] Chest 2 View Result Date: 02/07/2024 CLINICAL DATA:  Chest pain. EXAM: CHEST - 2 VIEW COMPARISON:  12/13/2023. FINDINGS: Stable cardiomediastinal contours. Stable left-sided 3 lead pacemaker. Aortic valve prosthesis is unchanged. Aortic atherosclerosis. No focal consolidation, pleural effusion, or pneumothorax. Remote healed fracture deformity of the left proximal humerus. No acute osseous abnormality. IMPRESSION: No acute cardiopulmonary findings. Electronically Signed   By: Harrietta Sherry M.D.   On: 02/07/2024 13:46     Procedures   Medications Ordered in the ED  sodium zirconium  cyclosilicate (LOKELMA ) packet 10 g (10 g Oral Given 02/07/24 1549)  insulin  aspart (novoLOG ) injection 5 Units (5 Units Intravenous Given 02/07/24 1551)    And  dextrose  50 % solution 50 mL (50 mLs Intravenous Given 02/07/24 1549)  albuterol  (PROVENTIL ) (2.5 MG/3ML) 0.083% nebulizer solution 5 mg (5 mg Nebulization Given 02/07/24 1549)                                    Medical Decision Making Amount and/or Complexity of Data Reviewed Labs: ordered. Radiology: ordered.  Risk OTC drugs. Prescription drug management.   Molly Cobb is here after episode of chest discomfort this morning.  Resolves very quickly.  May be related to eating.  She is normal vitals.  No fever.  History of high cholesterol CKD CAD.  EKG shows paced rhythm.  No ischemic changes.  She has not been having any discomfort otherwise.  Differential diagnosis likely GI related process or MSK related process but will evaluate for pneumonia ACS.  I have no concern for PE.  She not tachycardic or hypoxic or complained of any respiratory symptoms.  No recent surgery or travel.  Overall lab work for my review and interpretation shows a potassium of 6.1.  However no EKG changes to suggest hyperkalemia.  Overall we will give insulin  dextrose  albuterol  Lokelma  and recheck potassium.  It was 5.6 a couple days ago as well.  She has a history of chronic kidney disease but not on dialysis.  Ultimate lab work is otherwise unremarkable.  Troponin appears to be at baseline at 26 but will trend.  Chest x-ray shows no evidence of pneumonia or volume overload pneumothorax.  I have no concern for dissection.  Ultimately we will trend second troponin will give medications to help correct potassium and recheck potassium.  If unable to correct may need admission for this.  Overall potassium has improved to 5.8.  Blood sugar however downtrending now to 63.  Will  let her eat and drink and monitor her blood sugar.  Overall I did talk with Dr. Norine  with nephrology and assuming blood sugars improve can discharge her home with Lokelma  for few days and have her have her potassium rechecked by her nephrologist/primary care.  Patient doing well.  Blood sugars improved.  Will prescribe Lokelma  to take the next several days.  Will need to follow-up with primary care doctor and nephrology to check potassium in a couple days.  Told to return if symptoms worsen.  This chart was dictated using voice recognition software.  Despite best efforts to proofread,  errors can occur which can change the documentation meaning.      Final diagnoses:  Nonspecific chest pain  Hyperkalemia    ED Discharge Orders          Ordered    sodium zirconium cyclosilicate  (LOKELMA ) 5 g packet  Daily        02/07/24 2140               Ruthe Cornet, DO 02/07/24 2141

## 2024-02-07 NOTE — ED Notes (Signed)
 [facility] called. Pt placed on monitor.

## 2024-02-07 NOTE — Discharge Instructions (Addendum)
 Follow-up with your primary care doctor or your nephrologist to have your potassium rechecked in the next 2 to 3 days.  Take Lokelma  as prescribed to help keep her potassium levels.  Also further discuss your chest pain with your primary care doctor.  Return if symptoms worsen.

## 2024-02-07 NOTE — ED Triage Notes (Signed)
 Patient BIB [facility] from [facility] for CP starting about 0930, asked facility to call EMS, on EMS arrival pain was gone, remains gone, but patient requested to be transferred. EMS administered 324mg  aspirin . 18 g L forearm.  BP 186/90 HR 92 RR 16 96% RA CBG 241

## 2024-03-23 ENCOUNTER — Emergency Department (HOSPITAL_COMMUNITY)
Admission: EM | Admit: 2024-03-23 | Discharge: 2024-03-23 | Disposition: A | Discharge status: Skilled Nursing Facility | Attending: Emergency Medicine | Admitting: Emergency Medicine

## 2024-03-23 ENCOUNTER — Other Ambulatory Visit: Payer: Self-pay

## 2024-03-23 ENCOUNTER — Encounter (HOSPITAL_COMMUNITY): Payer: Self-pay

## 2024-03-23 DIAGNOSIS — Z7982 Long term (current) use of aspirin: Secondary | ICD-10-CM | POA: Diagnosis not present

## 2024-03-23 DIAGNOSIS — W19XXXA Unspecified fall, initial encounter: Secondary | ICD-10-CM | POA: Diagnosis not present

## 2024-03-23 DIAGNOSIS — F0390 Unspecified dementia without behavioral disturbance: Secondary | ICD-10-CM | POA: Insufficient documentation

## 2024-03-23 DIAGNOSIS — S0083XA Contusion of other part of head, initial encounter: Secondary | ICD-10-CM | POA: Diagnosis not present

## 2024-03-23 DIAGNOSIS — R519 Headache, unspecified: Secondary | ICD-10-CM | POA: Diagnosis present

## 2024-03-23 NOTE — ED Provider Notes (Signed)
 [place] EMERGENCY DEPARTMENT AT [facility] Provider Note   CSN: [id number] Arrival date & time: 03/23/24  2054     Patient presents with: Felton Shuck Auriel Kist is a 89 y.o. female.   89 year old female presents after a witnessed fall from facility.  Patient has history of dementia and is on hospice care at this time.  Was in a reclining chair at 1 point and then when they went to round on her and she was found on the ground.  She was awake and alert.  Had no vomiting.  Was found to have an abrasion on the left side of her face with a small hematoma.  EMS called and patient transported here.       Prior to Admission medications   Medication Sig Start Date End Date Taking? Authorizing Provider  acetaminophen  (TYLENOL ) 500 MG tablet Take 500 mg by mouth every 6 (six) hours as needed (Pain). 05/11/22   [provider]  aspirin  81 MG chewable tablet Chew 1 tablet (81 mg total) by mouth daily. 02/25/17   Henry Manuelita NOVAK, NP  atorvastatin  (LIPITOR) 40 MG tablet Take 1 tablet (40 mg total) by mouth every morning. 10/01/21   Medina-Vargas, Monina C, NP  carvedilol  (COREG ) 12.5 MG tablet Take 12.5 mg by mouth 2 (two) times daily with a meal. 03/30/23 03/29/24  [provider]  divalproex  (DEPAKOTE ) 125 MG DR tablet Take 125 mg by mouth every evening. 11/30/23   [provider]  escitalopram  (LEXAPRO ) 10 MG tablet Take 1 tablet (10 mg total) by mouth every morning. 10/01/21   Medina-Vargas, Monina C, NP  ezetimibe  (ZETIA ) 10 MG tablet Take 1 tablet (10 mg total) by mouth every morning. 10/01/21   Medina-Vargas, Monina C, NP  fexofenadine (ALLEGRA) 180 MG tablet Take 180 mg by mouth daily.    [provider]  glipiZIDE  (GLUCOTROL ) 5 MG tablet Take 5 mg by mouth daily. 11/30/23   [provider]  Glucerna (GLUCERNA) LIQD Take 0.5 Cans by mouth in the morning and at bedtime.    [provider]  isosorbide  mononitrate (IMDUR ) 30 MG 24 hr  tablet Take 1 tablet (30 mg total) by mouth every morning. 10/01/21   Medina-Vargas, Monina C, NP  levothyroxine  (SYNTHROID ) 100 MCG tablet Take 1 tablet (100 mcg total) by mouth daily at 6 (six) AM. 07/06/23   Amin, Ankit C, MD  lidocaine  (LIDODERM ) 5 % Place 1 patch onto the skin daily. Remove & Discard patch within 12 hours or as directed by MD Patient taking differently: Place 1 patch onto the skin in the morning. Apply to right knee 05/31/22   Desiderio Chew, PA-C  magnesium  oxide (MAG-OX) 400 MG tablet Take 400 mg by mouth daily. 01/13/22   [provider]  pantoprazole  (PROTONIX ) 40 MG tablet Take 1 tablet (40 mg total) by mouth daily. 10/01/21   Medina-Vargas, Monina C, NP  senna-docusate (SENOKOT-S) 8.6-50 MG tablet Take 2 tablets by mouth 2 (two) times daily as needed for mild constipation or moderate constipation. 12/12/21   [provider]  sodium bicarbonate  650 MG tablet Take 1 tablet (650 mg total) by mouth every morning. Patient taking differently: Take 650 mg by mouth in the morning. 10/01/21   Medina-Vargas, Monina C, NP  TRADJENTA  5 MG TABS tablet Take 1 tablet (5 mg total) by mouth every morning. 10/01/21   Medina-Vargas, Monina C, NP  traZODone  (DESYREL ) 50 MG tablet Take 50 mg by mouth at bedtime.  04/05/22 09/28/24  [provider]    Allergies: Oxybutynin, Oysters [shellfish allergy], Shellfish-derived products, Sulfa antibiotics, and Sulfacetamide sodium    Review of Systems  Unable to perform ROS: Dementia    Updated Vital Signs BP (!) 164/56   Pulse 74   Temp 98.1 F (36.7 C) (Oral)   Resp 18   SpO2 100%   Physical Exam Vitals and nursing note reviewed.  Constitutional:      General: She is not in acute distress.    Appearance: Normal appearance. She is well-developed. She is not toxic-appearing.  HENT:     Head:   Eyes:     General: Lids are normal.     Conjunctiva/sclera: Conjunctivae normal.     Pupils: Pupils are equal, round, and  reactive to light.  Neck:     Thyroid : No thyroid  mass.     Trachea: No tracheal deviation.  Cardiovascular:     Rate and Rhythm: Normal rate and regular rhythm.     Heart sounds: Normal heart sounds. No murmur heard.    No gallop.  Pulmonary:     Effort: Pulmonary effort is normal. No respiratory distress.     Breath sounds: Normal breath sounds. No stridor. No decreased breath sounds, wheezing, rhonchi or rales.  Abdominal:     General: There is no distension.     Palpations: Abdomen is soft.     Tenderness: There is no abdominal tenderness. There is no rebound.  Musculoskeletal:        General: No tenderness. Normal range of motion.     Cervical back: Normal range of motion and neck supple.     Comments: No gross deformities noted in her upper or lower extremities  Skin:    General: Skin is warm and dry.     Findings: No abrasion or rash.  Neurological:     General: No focal deficit present.     Mental Status: She is alert and oriented to person, place, and time. Mental status is at baseline.     GCS: GCS eye subscore is 4. GCS verbal subscore is 5. GCS motor subscore is 6.     Cranial Nerves: No cranial nerve deficit.     Sensory: No sensory deficit.     Motor: Motor function is intact.  Psychiatric:        Attention and Perception: Attention normal.        Mood and Affect: Affect is flat.        Speech: Speech normal.     (all labs ordered are listed, but only abnormal results are displayed) Labs Reviewed - No data to display  EKG: None  Radiology: No results found.   Procedures   Medications Ordered in the ED - No data to display                                  Medical Decision Making  Per family patient is at her neurological baseline at this time.  Long discussion with family and they agreed to no interventions at this time.  Will not do any imaging at this time.  Will discharge back to facility     Final diagnoses:  None    ED Discharge Orders      None          Dasie Faden, MD 03/23/24 2145

## 2024-03-23 NOTE — ED Triage Notes (Signed)
 Pt brought in via EMS from [facility] memory care w/ c/o of a unwitnessed fall. Last seen sitting in recliner and on next round she was found partially under recliner. Denies LOC. 0 thinners. Endorses hitting head. Redness above left eye.Tenderness in shoulder from flu shot. Left arm pain. Bilateral Temporal pain. Hx of dementia. Wheel chair bound at baseline.

## 2024-04-30 ENCOUNTER — Encounter: Payer: Self-pay | Admitting: Radiology

## 2024-06-28 DEATH — deceased
